# Patient Record
Sex: Male | Born: 1935
Health system: Southern US, Community
[De-identification: ages and names within clinical notes are randomized; demographics above are authoritative.]

## PROBLEM LIST (undated history)

## (undated) DIAGNOSIS — C61 Malignant neoplasm of prostate: Secondary | ICD-10-CM

## (undated) DIAGNOSIS — N186 End stage renal disease: Secondary | ICD-10-CM

## (undated) DIAGNOSIS — E785 Hyperlipidemia, unspecified: Secondary | ICD-10-CM

## (undated) DIAGNOSIS — Z9289 Personal history of other medical treatment: Secondary | ICD-10-CM

## (undated) DIAGNOSIS — D638 Anemia in other chronic diseases classified elsewhere: Secondary | ICD-10-CM

## (undated) DIAGNOSIS — E669 Obesity, unspecified: Secondary | ICD-10-CM

## (undated) DIAGNOSIS — C7951 Secondary malignant neoplasm of bone: Secondary | ICD-10-CM

## (undated) DIAGNOSIS — M109 Gout, unspecified: Secondary | ICD-10-CM

## (undated) DIAGNOSIS — Z992 Dependence on renal dialysis: Secondary | ICD-10-CM

## (undated) DIAGNOSIS — I2510 Atherosclerotic heart disease of native coronary artery without angina pectoris: Secondary | ICD-10-CM

## (undated) DIAGNOSIS — I10 Essential (primary) hypertension: Secondary | ICD-10-CM

## (undated) DIAGNOSIS — E119 Type 2 diabetes mellitus without complications: Secondary | ICD-10-CM

## (undated) DIAGNOSIS — I214 Non-ST elevation (NSTEMI) myocardial infarction: Secondary | ICD-10-CM

## (undated) HISTORY — DX: Gout, unspecified: M10.9

## (undated) HISTORY — DX: Hyperlipidemia, unspecified: E78.5

## (undated) HISTORY — PX: AV FISTULA PLACEMENT: SHX1204

## (undated) HISTORY — DX: Essential (primary) hypertension: I10

## (undated) HISTORY — DX: Obesity, unspecified: E66.9

## (undated) HISTORY — DX: Non-ST elevation (NSTEMI) myocardial infarction: I21.4

## (undated) HISTORY — PX: EYE SURGERY: SHX253

## (undated) HISTORY — PX: COLONOSCOPY W/ POLYPECTOMY: SHX1380

---

## 2005-03-31 ENCOUNTER — Emergency Department (HOSPITAL_COMMUNITY): Admission: EM | Admit: 2005-03-31 | Discharge: 2005-03-31 | Payer: Self-pay | Admitting: Emergency Medicine

## 2005-04-03 ENCOUNTER — Ambulatory Visit (HOSPITAL_COMMUNITY): Admission: RE | Admit: 2005-04-03 | Discharge: 2005-04-03 | Payer: Self-pay | Admitting: Family Medicine

## 2005-06-21 ENCOUNTER — Encounter: Admission: RE | Admit: 2005-06-21 | Discharge: 2005-06-21 | Payer: Self-pay | Admitting: Nephrology

## 2005-07-31 ENCOUNTER — Encounter: Admission: RE | Admit: 2005-07-31 | Discharge: 2005-07-31 | Payer: Self-pay | Admitting: Nephrology

## 2005-09-15 ENCOUNTER — Encounter: Admission: RE | Admit: 2005-09-15 | Discharge: 2005-09-15 | Payer: Self-pay | Admitting: Nephrology

## 2006-01-31 ENCOUNTER — Encounter (INDEPENDENT_AMBULATORY_CARE_PROVIDER_SITE_OTHER): Payer: Self-pay | Admitting: Specialist

## 2006-01-31 ENCOUNTER — Ambulatory Visit (HOSPITAL_COMMUNITY): Admission: RE | Admit: 2006-01-31 | Discharge: 2006-01-31 | Payer: Self-pay | Admitting: Gastroenterology

## 2006-06-20 ENCOUNTER — Encounter (HOSPITAL_COMMUNITY): Admission: RE | Admit: 2006-06-20 | Discharge: 2006-09-18 | Payer: Self-pay | Admitting: Nephrology

## 2006-09-26 ENCOUNTER — Encounter (HOSPITAL_COMMUNITY): Admission: RE | Admit: 2006-09-26 | Discharge: 2006-12-25 | Payer: Self-pay | Admitting: Nephrology

## 2007-01-02 ENCOUNTER — Encounter (HOSPITAL_COMMUNITY): Admission: RE | Admit: 2007-01-02 | Discharge: 2007-04-02 | Payer: Self-pay | Admitting: Nephrology

## 2007-04-17 ENCOUNTER — Encounter (HOSPITAL_COMMUNITY): Admission: RE | Admit: 2007-04-17 | Discharge: 2007-07-16 | Payer: Self-pay | Admitting: Nephrology

## 2007-09-20 ENCOUNTER — Encounter (HOSPITAL_COMMUNITY): Admission: RE | Admit: 2007-09-20 | Discharge: 2007-12-19 | Payer: Self-pay | Admitting: Nephrology

## 2008-01-06 ENCOUNTER — Encounter (HOSPITAL_COMMUNITY): Admission: RE | Admit: 2008-01-06 | Discharge: 2008-01-09 | Payer: Self-pay | Admitting: Nephrology

## 2008-01-20 ENCOUNTER — Encounter (HOSPITAL_COMMUNITY): Admission: RE | Admit: 2008-01-20 | Discharge: 2008-04-19 | Payer: Self-pay | Admitting: Nephrology

## 2008-04-27 ENCOUNTER — Encounter: Admission: RE | Admit: 2008-04-27 | Discharge: 2008-07-26 | Payer: Self-pay | Admitting: Nephrology

## 2008-08-03 ENCOUNTER — Encounter (HOSPITAL_COMMUNITY): Admission: RE | Admit: 2008-08-03 | Discharge: 2008-11-01 | Payer: Self-pay | Admitting: Nephrology

## 2008-11-09 ENCOUNTER — Encounter (HOSPITAL_COMMUNITY): Admission: RE | Admit: 2008-11-09 | Discharge: 2009-02-07 | Payer: Self-pay | Admitting: Nephrology

## 2008-12-09 HISTORY — PX: ARTERIOVENOUS GRAFT PLACEMENT: SUR1029

## 2008-12-15 ENCOUNTER — Ambulatory Visit: Payer: Self-pay | Admitting: Vascular Surgery

## 2008-12-18 ENCOUNTER — Ambulatory Visit: Payer: Self-pay | Admitting: Vascular Surgery

## 2008-12-18 ENCOUNTER — Ambulatory Visit (HOSPITAL_COMMUNITY): Admission: RE | Admit: 2008-12-18 | Discharge: 2008-12-18 | Payer: Self-pay | Admitting: Vascular Surgery

## 2009-01-26 ENCOUNTER — Inpatient Hospital Stay (HOSPITAL_COMMUNITY): Admission: EM | Admit: 2009-01-26 | Discharge: 2009-02-07 | Payer: Self-pay | Admitting: Emergency Medicine

## 2009-01-26 ENCOUNTER — Ambulatory Visit: Payer: Self-pay | Admitting: Cardiology

## 2009-01-28 ENCOUNTER — Encounter: Payer: Self-pay | Admitting: Physician Assistant

## 2009-02-01 ENCOUNTER — Ambulatory Visit: Payer: Self-pay | Admitting: Physical Medicine & Rehabilitation

## 2009-02-01 ENCOUNTER — Encounter (INDEPENDENT_AMBULATORY_CARE_PROVIDER_SITE_OTHER): Payer: Self-pay | Admitting: Nephrology

## 2009-03-03 DIAGNOSIS — D649 Anemia, unspecified: Secondary | ICD-10-CM | POA: Insufficient documentation

## 2009-03-03 DIAGNOSIS — E669 Obesity, unspecified: Secondary | ICD-10-CM | POA: Insufficient documentation

## 2009-03-03 DIAGNOSIS — E785 Hyperlipidemia, unspecified: Secondary | ICD-10-CM | POA: Insufficient documentation

## 2009-03-03 DIAGNOSIS — I10 Essential (primary) hypertension: Secondary | ICD-10-CM | POA: Insufficient documentation

## 2009-03-03 DIAGNOSIS — N184 Chronic kidney disease, stage 4 (severe): Secondary | ICD-10-CM | POA: Insufficient documentation

## 2009-03-03 DIAGNOSIS — M109 Gout, unspecified: Secondary | ICD-10-CM

## 2009-03-04 ENCOUNTER — Ambulatory Visit: Payer: Self-pay | Admitting: Internal Medicine

## 2009-03-04 DIAGNOSIS — R0989 Other specified symptoms and signs involving the circulatory and respiratory systems: Secondary | ICD-10-CM

## 2009-03-04 DIAGNOSIS — R0609 Other forms of dyspnea: Secondary | ICD-10-CM

## 2009-03-16 ENCOUNTER — Telehealth (INDEPENDENT_AMBULATORY_CARE_PROVIDER_SITE_OTHER): Payer: Self-pay | Admitting: *Deleted

## 2009-03-17 ENCOUNTER — Ambulatory Visit: Payer: Self-pay

## 2009-03-17 ENCOUNTER — Encounter (HOSPITAL_COMMUNITY): Admission: RE | Admit: 2009-03-17 | Discharge: 2009-05-11 | Payer: Self-pay | Admitting: Cardiology

## 2009-03-17 ENCOUNTER — Ambulatory Visit: Payer: Self-pay | Admitting: Internal Medicine

## 2009-04-22 ENCOUNTER — Encounter: Payer: Self-pay | Admitting: Cardiology

## 2009-04-26 ENCOUNTER — Ambulatory Visit: Payer: Self-pay | Admitting: Cardiology

## 2009-04-26 DIAGNOSIS — I25810 Atherosclerosis of coronary artery bypass graft(s) without angina pectoris: Secondary | ICD-10-CM

## 2009-04-26 DIAGNOSIS — I214 Non-ST elevation (NSTEMI) myocardial infarction: Secondary | ICD-10-CM

## 2009-04-26 HISTORY — DX: Non-ST elevation (NSTEMI) myocardial infarction: I21.4

## 2010-02-08 NOTE — Progress Notes (Signed)
Summary: Nuclear Pre-Procedure  Phone Note Outgoing Call Call back at [facility] Phone [phone]   Call placed by: Eliezer Lofts, EMT-P,  March 16, 2009 2:52 PM Action Taken: Phone Call Completed Summary of Call: Reviewed information on Myoview Information Sheet (see scanned document for further details).  Spoke with Patient's wife.    Nuclear Med Background Indications for Stress Test: Evaluation for Ischemia   History: Echo  History Comments: 1?24/11 Echo: NL LVF, mild LVH  Symptoms: DOE, SOB    Nuclear Pre-Procedure Cardiac Risk Factors: Family History - CAD, History of Smoking, Hypertension, Lipids, NIDDM Height (in): 74

## 2010-02-08 NOTE — Assessment & Plan Note (Signed)
Summary: 2 month rov/sl   Visit Type:  Follow-up Primary Provider:  Dr. Claris Gower  CC:  HTN.  History of Present Illness: The patient presents after followup of a hospitalization during which he was managed for volume overload and also had enzymes consistent with a non-Q-wave infarct. He was seen once since admission. He did have an outpatient stress perfusion study which suggested perhaps a very small area of inferior infarct. However, it is felt that the enzyme elevation and volume overload are related to his dialysis dependent renal failure. He's been most bothered by gout which is now being treated. This has limited his activities though he is now able to ambulate. He is denying any chest discomfort, neck or arm discomfort. He is having no palpitations, presyncope or syncope. He is having no PND or orthopnea.  Current Medications (verified): 1)  Calcium Acetate 667 Mg Caps (Calcium Acetate (Phos Binder)) .... 3 Caps With Each Meal 2)  Atenolol 100 Mg Tabs (Atenolol) .Marland Kitchen.. 1 Tab Once Daily 3)  Colcrys 0.6 Mg Tabs (Colchicine) .Marland Kitchen.. 1 By Mouth Dialy 4)  Pravachol 40 Mg Tabs (Pravastatin Sodium) .... 2 By Mouth Daily  Allergies (verified): No Known Drug Allergies  Past History:  Past Medical History: Reviewed history from 03/03/2009 and no changes required.  1. Stage IV chronic kidney disease/end-stage renal disease.   2. Hypertension.   3. Anemia.   4. Diabetes.   5. Gout.   6. Dyslipidemia.   7. Obesity.   8. Family history of coronary artery disease.      Past Surgical History: Right upper extremity AV fistula in December 2010.   Review of Systems       Gout.  Otherwise as stated in the history of present illness negative for all other systems.  Vital Signs:  Patient profile:   77 year old male Height:      74 inches Weight:      194 pounds BMI:     25.00 Pulse rate:   61 / minute Resp:     16 per minute BP sitting:   132 / 64  (left arm)  Vitals Entered By:  Levora Angel, CNA (April 26, 2009 9:01 AM)  Physical Exam  General:  Well developed, well nourished, in no acute distress. Head:  normocephalic and atraumatic Neck:  Neck supple, no JVD. No masses, thyromegaly or abnormal cervical nodes. Chest Wall:  no deformities or breast masses noted Lungs:  Clear bilaterally to auscultation and percussion. Heart:  Non-displaced PMI, chest non-tender; regular rate and rhythm, S1, S2  rubs or gallops. Slight systolic murmur heard at the apex only an early peaking. Carotid upstroke normal, no bruit. Normal abdominal aortic size, no bruits. Femorals normal pulses, no bruits. Pedals normal pulses. No edema, no varicosities. Abdomen:  Bowel sounds positive; abdomen soft and non-tender without masses, organomegaly, or hernias noted. No hepatosplenomegaly. Msk:  Back normal, normal gait. Muscle strength and tone normal. Extremities:  right upper arm dialysis graft and fistula with thrill and bruit Neurologic:  Alert and oriented x 3. Skin:  Intact without lesions or rashes. Psych:  Normal affect.   Impression & Recommendations:  Problem # 1:  MYOCARDIAL INFARCTION, ACUTE, NON-Q WAVE (ICD-410.70) The patient had an enzyme elevation with multiple other comorbidities at the time of his hospital stay. He's not having any chest pain. He had a low risk perfusion study. No further cardiovascular testing is suggested. He should continue with aggressive risk reduction.  Problem # 2:  DYSLIPIDEMIA (ICD-272.4) He reports being given a "gold star" on his last lipids.  Problem # 3:  HYPERTENSION (ICD-401.9) He reports no BP drops with dialysis.  Meds per the neprhologists.  Patient Instructions: 1)  Your physician recommends that you schedule a follow-up appointment as needed 2)  Your physician recommends that you continue on your current medications as directed. Please refer to the Current Medication list given to you today.

## 2010-02-08 NOTE — Assessment & Plan Note (Signed)
Summary: aden myoview wt 18  Nuclear Med Background Indications for Stress Test: Evaluation for Ischemia, Abnormal EKG  Indications Comments: 02/07/09 admitted for ESRD on dialysis with ^cardiac enzymes and abnormal EKG  History: Echo  History Comments: 1?24/11 Echo:normal LVF, mild LVH  Symptoms: Chest Tightness, Diaphoresis, DOE, Rapid HR, SOB  Symptoms Comments: Last episode of [id number] since discharge.   Nuclear Pre-Procedure Cardiac Risk Factors: Family History - CAD, History of Smoking, Hypertension, Lipids, NIDDM Caffeine/Decaff Intake: none NPO After: 6:00 PM Lungs: Clear IV 0.9% NS with Angio Cath: 20g     IV Site: (L)  IV Started by: Eliezer Lofts EMT-P Chest Size (in) 46     Height (in): 74 Weight (lb): 194 BMI: 25.00  Nuclear Med Study 1 or 2 day study:  1 day     Stress Test Type:  Carlton Adam Reading MD:  Glori Bickers, MD     Referring MD:  Minus Breeding, MD Resting Radionuclide:  Technetium 76m Tetrofosmin     Resting Radionuclide Dose:  10.7 mCi  Stress Radionuclide:  Technetium 75m Tetrofosmin     Stress Radionuclide Dose:  33.0 mCi   Stress Protocol   Lexiscan: 0.4 mg   Stress Test Technologist:  Valetta Fuller CMA-N     Nuclear Technologist:  Mariann Laster Deal RT-N  Rest Procedure  Myocardial perfusion imaging was performed at rest 45 minutes following the intravenous administration of Myoview Technetium 98m Tetrofosmin.  Stress Procedure  The patient received IV Lexiscan 0.4 mg over 15-seconds.  Myoview injected at 30-seconds.  There were no significant changes with lexiscan.  Quantitative spect images were obtained after a 45 minute delay.  QPS Raw Data Images:  Normal; no motion artifact; normal heart/lung ratio. Stress Images:  Decreased uptake in the inferior wall Rest Images:  Decreased uptake in the inferior wall Subtraction (SDS):  There is a fixed defect that is most consistent with a previous infarction. Transient Ischemic Dilatation:  1.09   (Normal <1.22)  Lung/Heart Ratio:  .28  (Normal <0.45)  Quantitative Gated Spect Images QGS EDV:  151 ml QGS ESV:  66 ml QGS EF:  56 % QGS cine images:  Very mild inferior hypokinesis.  Findings Low risk nuclear study  Evidence for inferior infarct     Overall Impression  Exercise Capacity: [place] study with no exercise. ECG Impression: Baseline: NSR; No significant ST segment change with Lexiscan. Overall Impression: Previous inferior infarct. No ischemia.  Appended Document: aden myoview wt 225 Low risk exam.  Questionable old previous inferior infarct.  Echo shows no evidence of this.  Keep follow up appt in April.  Appended Document: aden myoview wt 225 pt aware of results

## 2010-02-08 NOTE — Assessment & Plan Note (Signed)
Summary: eph/dx: abn ekg/ gd   Visit Type:  EPH Primary Provider:  Dr. Arelia Sneddon  CC:  sob at times when laying down...has a gout flare today in his right hand/leg and left hand.  History of Present Illness: This is a 77 year old, African American male, patient, who was admitted to the hospital with end-stage renal disease and placed on dialysis. While in hospital. His cardiac enzymes were noted to be elevated and his EKG was abnormal. He was seen in consult by Dr.Hochrein. It was felt the cardiac enzymes and abnormal EKG were secondary to LVH with strain and end-stage renal disease. 2-D echo was done and showed normal LV function with LVH and grade 1 diastolic dysfunction. Because of this outpatient stress test was recommended.  The patient complains of significant pain from gout, since he's been home. He has swelling in his right hand and right foot because of this. He does have some dyspnea on exertion, which is worse after dialysis. He also wakes up short of breath at night sometimes. He says it's relieved with sitting up. He denies chest pain, palpitations, dizziness, or presyncope.  Problems Prior to Update: 1)  Obesity  (ICD-278.00) 2)  Dyslipidemia  (ICD-272.4) 3)  Gout  (ICD-274.9) 4)  Anemia  (ICD-285.9) 5)  Hypertension  (ICD-401.9) 6)  Renal Disease, Chronic, Stage Iv  (ICD-585.4)  Current Medications (verified): 1)  Calcium Acetate 667 Mg Caps (Calcium Acetate (Phos Binder)) .... 3 Caps With Each Meal 2)  Rena-Vite  Tabs (B Complex-C-Folic Acid) .Marland Kitchen.. 1 Tab Once Daily 3)  Allopurinol 300 Mg Tabs (Allopurinol) .... 1/2 Tab At Bedtime 4)  Atenolol 100 Mg Tabs (Atenolol) .Marland Kitchen.. 1 Tab Once Daily  Allergies (verified): No Known Drug Allergies  Past History:  Past Medical History: Last updated: 03/03/2009  1. Stage IV chronic kidney disease/end-stage renal disease.   2. Hypertension.   3. Anemia.   4. Diabetes.   5. Gout.   6. Dyslipidemia.   7. Obesity.   8. Family history  of coronary artery disease.      Review of Systems       see history of present illness  Vital Signs:  Patient profile:   77 year old male Height:      74 inches Weight:      225 pounds BMI:     28.99 Pulse rate:   67 / minute Pulse rhythm:   regular BP sitting:   137 / 64  (left arm) Cuff size:   regular  Vitals Entered By: Julaine Hua, CMA (March 04, 2009 8:39 AM)  Physical Exam  General:   Well-nournished, in no acute distress. Neck: No JVD, HJR, Bruit, or thyroid enlargement Lungs: No tachypnea, clear without wheezing, rales, or rhonchi Cardiovascular: RRR, PMI not displaced, heart sounds normal, no murmurs, gallops, bruit, thrill, or heave. Abdomen: BS normal. Soft without organomegaly, masses, lesions or tenderness. Extremities: his right hand is swollen from gout, trace of edema in bilateral ankles. The right foot is more swollen than left, secondary to gout,without cyanosis, clubbing. Good distal pulses bilateral SKin: Warm, no lesions or rashes  Musculoskeletal: No deformities Neuro: no focal signs    Impression & Recommendations:  Problem # 1:  DYSPNEA ON EXERTION (ICD-786.09) The patient continues to have dyspnea on exertion, and has multiple cardiac risk factors. On exam, his lungs are clear. He does have diastolic dysfunction on 2-D echo. Because of his cardiac risk factors and dyspnea on exertion. We will order an Adenosine Myoview. His  updated medication list for this problem includes:    Atenolol 100 Mg Tabs (Atenolol) .Marland Kitchen... 1 tab once daily  Orders: Nuclear Stress Test (Nuc Stress Test)  His updated medication list for this problem includes:    Atenolol 100 Mg Tabs (Atenolol) .Marland Kitchen... 1 tab once daily  Problem # 2:  HYPERTENSION (ICD-401.9) Patient needs refill on his atenolol His updated medication list for this problem includes:    Atenolol 100 Mg Tabs (Atenolol) .Marland Kitchen... 1 tab once daily  Problem # 3:  GOUT (ICD-274.9) patient's gout, is causing  him much discomfort. We will hold off on the stress test until he is more comfortable His updated medication list for this problem includes:    Allopurinol 300 Mg Tabs (Allopurinol) .Marland Kitchen... 1/2 tab at bedtime  Problem # 4:  RENAL DISEASE, CHRONIC, STAGE IV (ICD-585.4) Patient has end-stage renal disease on hemodialysis. He is in a wheelchair and has trouble getting around. I have filled out handicap applications for him.  Patient Instructions: 1)  Your physician recommends that you schedule a follow-up appointment in: 2 months with Dr. Percival Spanish 2)  Your physician has requested that you have an adenosine myoview.  For further information please visit [email or web address].  Please follow instruction sheet, as given. Prescriptions: ATENOLOL 100 MG TABS (ATENOLOL) 1 tab once daily  #30 x 6   Entered by:   Carollee Sires, RN, BSN   Authorized by:   Maretta Bees, PA-C   Signed by:   Carollee Sires, RN, BSN on 03/04/2009   Method used:   Electronically to        [facility] (retail)       [address]       [place], [place]  [address]       Ph: [phone]       Fax: [phone]   RxID:   [id number]

## 2010-02-08 NOTE — Miscellaneous (Signed)
  Clinical Lists Changes  Observations: Added new observation of NUCLEAR NOS: Exercise Capacity: [place] study with no exercise. ECG Impression: Baseline: NSR; No significant ST segment change with Lexiscan. Overall Impression: Previous inferior infarct. No ischemia.    (03/17/2009 13:57)      Nuclear Study  Procedure date:  03/17/2009  Findings:      Exercise Capacity: [place] study with no exercise. ECG Impression: Baseline: NSR; No significant ST segment change with Lexiscan. Overall Impression: Previous inferior infarct. No ischemia.

## 2010-03-27 LAB — RENAL FUNCTION PANEL
Albumin: 2.4 g/dL — ABNORMAL LOW (ref 3.5–5.2)
Albumin: 2.6 g/dL — ABNORMAL LOW (ref 3.5–5.2)
Albumin: 2.6 g/dL — ABNORMAL LOW (ref 3.5–5.2)
BUN: 123 mg/dL — ABNORMAL HIGH (ref 6–23)
BUN: 159 mg/dL — ABNORMAL HIGH (ref 6–23)
BUN: 97 mg/dL — ABNORMAL HIGH (ref 6–23)
CO2: 16 mEq/L — ABNORMAL LOW (ref 19–32)
CO2: 17 mEq/L — ABNORMAL LOW (ref 19–32)
CO2: 19 mEq/L (ref 19–32)
CO2: 25 mEq/L (ref 19–32)
Calcium: 7.8 mg/dL — ABNORMAL LOW (ref 8.4–10.5)
Calcium: 8.5 mg/dL (ref 8.4–10.5)
Calcium: 8.6 mg/dL (ref 8.4–10.5)
Chloride: 96 mEq/L (ref 96–112)
Creatinine, Ser: 8.88 mg/dL — ABNORMAL HIGH (ref 0.4–1.5)
Creatinine, Ser: 9.03 mg/dL — ABNORMAL HIGH (ref 0.4–1.5)
Creatinine, Ser: 9.44 mg/dL — ABNORMAL HIGH (ref 0.4–1.5)
GFR calc Af Amer: 7 mL/min — ABNORMAL LOW (ref >60)
GFR calc Af Amer: 7 mL/min — ABNORMAL LOW (ref >60)
GFR calc Af Amer: 8 mL/min — ABNORMAL LOW (ref >60)
GFR calc Af Amer: 9 mL/min — ABNORMAL LOW (ref >60)
GFR calc non Af Amer: 5 mL/min — ABNORMAL LOW (ref >60)
GFR calc non Af Amer: 6 mL/min — ABNORMAL LOW (ref >60)
GFR calc non Af Amer: 7 mL/min — ABNORMAL LOW (ref >60)
Glucose, Bld: 121 mg/dL — ABNORMAL HIGH (ref 70–99)
Glucose, Bld: 146 mg/dL — ABNORMAL HIGH (ref 70–99)
Glucose, Bld: 178 mg/dL — ABNORMAL HIGH (ref 70–99)
Phosphorus: 10.2 mg/dL (ref 2.3–4.6)
Phosphorus: 4.9 mg/dL — ABNORMAL HIGH (ref 2.3–4.6)
Phosphorus: 9.3 mg/dL (ref 2.3–4.6)
Potassium: 4.3 mEq/L (ref 3.5–5.1)
Potassium: 5.2 mEq/L — ABNORMAL HIGH (ref 3.5–5.1)
Sodium: 135 mEq/L (ref 135–145)
Sodium: 139 mEq/L (ref 135–145)

## 2010-03-27 LAB — CBC
HCT: 25.5 % — ABNORMAL LOW (ref 39.0–52.0)
HCT: 27.3 % — ABNORMAL LOW (ref 39.0–52.0)
HCT: 30.3 % — ABNORMAL LOW (ref 39.0–52.0)
Hemoglobin: 10.3 g/dL — ABNORMAL LOW (ref 13.0–17.0)
Hemoglobin: 8.8 g/dL — ABNORMAL LOW (ref 13.0–17.0)
MCHC: 33 g/dL (ref 30.0–36.0)
MCHC: 33.7 g/dL (ref 30.0–36.0)
MCHC: 34.7 g/dL (ref 30.0–36.0)
MCV: 89.7 fL (ref 78.0–100.0)
MCV: 89.7 fL (ref 78.0–100.0)
MCV: 91.5 fL (ref 78.0–100.0)
Platelets: 492 10*3/uL — ABNORMAL HIGH (ref 150–400)
Platelets: 501 10*3/uL — ABNORMAL HIGH (ref 150–400)
Platelets: 511 10*3/uL — ABNORMAL HIGH (ref 150–400)
RBC: 2.84 MIL/uL — ABNORMAL LOW (ref 4.22–5.81)
RBC: 3.27 MIL/uL — ABNORMAL LOW (ref 4.22–5.81)
RBC: 3.3 MIL/uL — ABNORMAL LOW (ref 4.22–5.81)
RDW: 14.3 % (ref 11.5–15.5)
RDW: 14.6 % (ref 11.5–15.5)
WBC: 17.7 10*3/uL — ABNORMAL HIGH (ref 4.0–10.5)

## 2010-03-27 LAB — GLUCOSE, CAPILLARY
Glucose-Capillary: 103 mg/dL — ABNORMAL HIGH (ref 70–99)
Glucose-Capillary: 104 mg/dL — ABNORMAL HIGH (ref 70–99)
Glucose-Capillary: 105 mg/dL — ABNORMAL HIGH (ref 70–99)
Glucose-Capillary: 109 mg/dL — ABNORMAL HIGH (ref 70–99)
Glucose-Capillary: 110 mg/dL — ABNORMAL HIGH (ref 70–99)
Glucose-Capillary: 110 mg/dL — ABNORMAL HIGH (ref 70–99)
Glucose-Capillary: 120 mg/dL — ABNORMAL HIGH (ref 70–99)
Glucose-Capillary: 120 mg/dL — ABNORMAL HIGH (ref 70–99)
Glucose-Capillary: 121 mg/dL — ABNORMAL HIGH (ref 70–99)
Glucose-Capillary: 123 mg/dL — ABNORMAL HIGH (ref 70–99)
Glucose-Capillary: 126 mg/dL — ABNORMAL HIGH (ref 70–99)
Glucose-Capillary: 127 mg/dL — ABNORMAL HIGH (ref 70–99)
Glucose-Capillary: 128 mg/dL — ABNORMAL HIGH (ref 70–99)
Glucose-Capillary: 129 mg/dL — ABNORMAL HIGH (ref 70–99)
Glucose-Capillary: 131 mg/dL — ABNORMAL HIGH (ref 70–99)
Glucose-Capillary: 132 mg/dL — ABNORMAL HIGH (ref 70–99)
Glucose-Capillary: 137 mg/dL — ABNORMAL HIGH (ref 70–99)
Glucose-Capillary: 149 mg/dL — ABNORMAL HIGH (ref 70–99)
Glucose-Capillary: 151 mg/dL — ABNORMAL HIGH (ref 70–99)
Glucose-Capillary: 157 mg/dL — ABNORMAL HIGH (ref 70–99)
Glucose-Capillary: 166 mg/dL — ABNORMAL HIGH (ref 70–99)
Glucose-Capillary: 172 mg/dL — ABNORMAL HIGH (ref 70–99)
Glucose-Capillary: 195 mg/dL — ABNORMAL HIGH (ref 70–99)
Glucose-Capillary: 96 mg/dL (ref 70–99)
Glucose-Capillary: 97 mg/dL (ref 70–99)

## 2010-03-27 LAB — DIFFERENTIAL
Eosinophils Relative: 0 % (ref 0–5)
Lymphocytes Relative: 7 % — ABNORMAL LOW (ref 12–46)
Lymphs Abs: 0.7 10*3/uL (ref 0.7–4.0)
Monocytes Relative: 8 % (ref 3–12)
Neutro Abs: 8.4 10*3/uL — ABNORMAL HIGH (ref 1.7–7.7)

## 2010-03-27 LAB — BASIC METABOLIC PANEL
BUN: 234 mg/dL — ABNORMAL HIGH (ref 6–23)
CO2: 9 mEq/L — CL (ref 19–32)
Calcium: 7.8 mg/dL — ABNORMAL LOW (ref 8.4–10.5)
Chloride: 100 mEq/L (ref 96–112)
Chloride: 97 mEq/L (ref 96–112)
Creatinine, Ser: 8.97 mg/dL — ABNORMAL HIGH (ref 0.4–1.5)
GFR calc Af Amer: 7 mL/min — ABNORMAL LOW (ref >60)
GFR calc non Af Amer: 6 mL/min — ABNORMAL LOW (ref >60)
Glucose, Bld: 95 mg/dL (ref 70–99)
Potassium: 4.8 mEq/L (ref 3.5–5.1)
Sodium: 134 mEq/L — ABNORMAL LOW (ref 135–145)

## 2010-03-27 LAB — CARDIAC PANEL(CRET KIN+CKTOT+MB+TROPI)
CK, MB: 4.9 ng/mL — ABNORMAL HIGH (ref 0.3–4.0)
CK, MB: 7 ng/mL (ref 0.3–4.0)
Relative Index: 0.8 (ref 0.0–2.5)
Total CK: 494 U/L — ABNORMAL HIGH (ref 7–232)
Total CK: 632 U/L — ABNORMAL HIGH (ref 7–232)
Troponin I: 0.05 ng/mL (ref 0.00–0.06)
Troponin I: 0.19 ng/mL — ABNORMAL HIGH (ref 0.00–0.06)
Troponin I: 0.2 ng/mL — ABNORMAL HIGH (ref 0.00–0.06)

## 2010-03-27 LAB — POTASSIUM: Potassium: 5 mEq/L (ref 3.5–5.1)

## 2010-03-27 LAB — HEPATITIS B CORE ANTIBODY, TOTAL: Hep B Core Total Ab: NEGATIVE

## 2010-03-27 LAB — IRON AND TIBC
Iron: 26 ug/dL — ABNORMAL LOW (ref 42–135)
TIBC: 204 ug/dL — ABNORMAL LOW (ref 215–435)
UIBC: 178 ug/dL

## 2010-03-27 LAB — HEPATITIS B SURFACE ANTIGEN: Hepatitis B Surface Ag: NEGATIVE

## 2010-03-27 LAB — POCT CARDIAC MARKERS
CKMB, poc: 25 ng/mL (ref 1.0–8.0)
Myoglobin, poc: >500 ng/mL (ref 12–200)
Troponin i, poc: <0.05 ng/mL (ref 0.00–0.09)

## 2010-03-27 LAB — CK TOTAL AND CKMB (NOT AT ARMC): CK, MB: 10 ng/mL (ref 0.3–4.0)

## 2010-03-27 LAB — URIC ACID: Uric Acid, Serum: 14.6 mg/dL (ref 4.0–7.8)

## 2010-04-12 LAB — POCT I-STAT 4, (NA,K, GLUC, HGB,HCT)
Glucose, Bld: 113 mg/dL — ABNORMAL HIGH (ref 70–99)
HCT: 38 % — ABNORMAL LOW (ref 39.0–52.0)
Hemoglobin: 12.9 g/dL — ABNORMAL LOW (ref 13.0–17.0)
Potassium: 4.5 mEq/L (ref 3.5–5.1)

## 2010-04-12 LAB — GLUCOSE, CAPILLARY: Glucose-Capillary: 96 mg/dL (ref 70–99)

## 2010-04-12 LAB — POCT HEMOGLOBIN-HEMACUE: Hemoglobin: 10.6 g/dL — ABNORMAL LOW (ref 13.0–17.0)

## 2010-04-13 LAB — RENAL FUNCTION PANEL
Albumin: 3.4 g/dL — ABNORMAL LOW (ref 3.5–5.2)
BUN: 64 mg/dL — ABNORMAL HIGH (ref 6–23)
Chloride: 110 mEq/L (ref 96–112)
Creatinine, Ser: 4.94 mg/dL — ABNORMAL HIGH (ref 0.4–1.5)
GFR calc Af Amer: 14 mL/min — ABNORMAL LOW (ref >60)
GFR calc non Af Amer: 12 mL/min — ABNORMAL LOW (ref >60)
Phosphorus: 3.8 mg/dL (ref 2.3–4.6)
Potassium: 4.2 mEq/L (ref 3.5–5.1)

## 2010-04-13 LAB — PTH, INTACT AND CALCIUM
Calcium, Total (PTH): 7.9 mg/dL — ABNORMAL LOW (ref 8.4–10.5)
PTH: 413.4 pg/mL — ABNORMAL HIGH (ref 14.0–72.0)

## 2010-04-13 LAB — POCT HEMOGLOBIN-HEMACUE: Hemoglobin: 10.1 g/dL — ABNORMAL LOW (ref 13.0–17.0)

## 2010-04-14 LAB — POCT HEMOGLOBIN-HEMACUE: Hemoglobin: 12.6 g/dL — ABNORMAL LOW (ref 13.0–17.0)

## 2010-04-15 LAB — POCT HEMOGLOBIN-HEMACUE: Hemoglobin: 11.4 g/dL — ABNORMAL LOW (ref 13.0–17.0)

## 2010-04-16 LAB — RENAL FUNCTION PANEL
CO2: 20 mEq/L (ref 19–32)
Chloride: 112 mEq/L (ref 96–112)
Creatinine, Ser: 4.33 mg/dL — ABNORMAL HIGH (ref 0.4–1.5)
GFR calc Af Amer: 16 mL/min — ABNORMAL LOW (ref >60)
GFR calc non Af Amer: 13 mL/min — ABNORMAL LOW (ref >60)
Potassium: 4.9 mEq/L (ref 3.5–5.1)

## 2010-04-16 LAB — POCT HEMOGLOBIN-HEMACUE: Hemoglobin: 11.6 g/dL — ABNORMAL LOW (ref 13.0–17.0)

## 2010-04-16 LAB — IRON AND TIBC
Iron: 94 ug/dL (ref 42–135)
UIBC: 181 ug/dL

## 2010-04-17 LAB — POCT HEMOGLOBIN-HEMACUE
Hemoglobin: 10.9 g/dL — ABNORMAL LOW (ref 13.0–17.0)
Hemoglobin: 11.5 g/dL — ABNORMAL LOW (ref 13.0–17.0)

## 2010-04-18 LAB — POCT HEMOGLOBIN-HEMACUE: Hemoglobin: 12.2 g/dL — ABNORMAL LOW (ref 13.0–17.0)

## 2010-04-19 LAB — RENAL FUNCTION PANEL
Albumin: 3.2 g/dL — ABNORMAL LOW (ref 3.5–5.2)
Calcium: 8.1 mg/dL — ABNORMAL LOW (ref 8.4–10.5)
GFR calc Af Amer: 16 mL/min — ABNORMAL LOW (ref >60)
Phosphorus: 4.6 mg/dL (ref 2.3–4.6)
Potassium: 4.3 mEq/L (ref 3.5–5.1)
Sodium: 139 mEq/L (ref 135–145)

## 2010-04-19 LAB — POCT HEMOGLOBIN-HEMACUE: Hemoglobin: 11.6 g/dL — ABNORMAL LOW (ref 13.0–17.0)

## 2010-04-19 LAB — FERRITIN: Ferritin: 81 ng/mL (ref 22–322)

## 2010-04-19 LAB — PTH, INTACT AND CALCIUM
Calcium, Total (PTH): 8.1 mg/dL — ABNORMAL LOW (ref 8.4–10.5)
PTH: 438 pg/mL — ABNORMAL HIGH (ref 14.0–72.0)

## 2010-04-19 LAB — IRON AND TIBC: TIBC: 297 ug/dL (ref 215–435)

## 2010-04-20 LAB — POCT HEMOGLOBIN-HEMACUE: Hemoglobin: 11.4 g/dL — ABNORMAL LOW (ref 13.0–17.0)

## 2010-04-26 LAB — FERRITIN: Ferritin: 117 ng/mL (ref 22–322)

## 2010-04-26 LAB — POCT HEMOGLOBIN-HEMACUE: Hemoglobin: 11.2 g/dL — ABNORMAL LOW (ref 13.0–17.0)

## 2010-04-26 LAB — IRON AND TIBC
Saturation Ratios: 35 % (ref 20–55)
UIBC: 190 ug/dL

## 2010-04-26 LAB — PTH, INTACT AND CALCIUM: Calcium, Total (PTH): 8.7 mg/dL (ref 8.4–10.5)

## 2010-05-24 NOTE — Procedures (Signed)
CEPHALIC VEIN MAPPING   INDICATION:  Preop AVF.   HISTORY:  Chronic kidney disease.   EXAM:  The right cephalic vein is compressible.   Diameter measurements range from 0.27 cm to 0.69 cm; however, 0.39 cm to  0.69 cm in the brachium.   The left cephalic vein is compressible.   Diameter measurements range from 0.23 cm to 0.69 cm; however, 0.36 cm to  0.69 cm in the brachium.   See attached worksheet for all measurements.   IMPRESSION:  1. Patent bilateral cephalic veins which are of acceptable diameter      for use as a dialysis access site in the brachium.  2. Bilateral basilic veins are patent with measurements as shown in      the diagram.   ___________________________________________  Nelda Severe. Kellie Simmering, M.D.   AS/MEDQ  D:  12/15/2008  T:  12/15/2008  Job:  [id number]

## 2010-05-24 NOTE — Consult Note (Signed)
NEW PATIENT CONSULTATION   [name], [name]  DOB:  [birth year]                                       12/15/2008  CHART#:[id number]   The patient is a 77 year old male patient referred by Dr. Florene [name] for end-  stage renal disease and vascular access evaluation.  This left-handed  gentleman has a history of progressive renal insufficiency over the last  few years and is nearing hemodialysis with a creatinine recently of 4.4.  Although he uses both upper extremities frequently he primarily uses his  left upper extremity to write and for most activities.   CHRONIC STABLE MEDICAL PROBLEMS:  Include:  1. Hypertension.  2. Hyperlipidemia.  3. Gout.  4. End-stage renal disease.  5. Type 2 diabetes mellitus.   PAST SURGICAL HISTORY:  None.   SOCIAL HISTORY:  Positive for diabetes in sister.  Negative for coronary  artery disease and stroke.   SOCIAL HISTORY:  He is married, has 2 children and is a retired Dietitian.  He smoked a pack of cigarettes per day for 50 years but has not  in the last several years.  He does not use alcohol.   REVIEW OF SYSTEMS:  Positive for dyspnea on exertion, chronic renal  insufficiency, arthritis, joint pain, history of gout.  Otherwise a 10  point review of systems negative except for HPI.   PHYSICAL EXAM:  Vital signs:  Blood pressure is 140/80, heart rate is  70, respirations 14.  General:  He is a well-developed, well-nourished  male who is in no apparent distress, alert and oriented x3.  Neck:  Supple, 3+ carotid pulses palpable.  No bruits are audible.  Neurological:  Normal.  No palpable adenopathy in the neck.  EOMs are  intact.  HEENT:  Exam is normal.  Chest:  Clear to auscultation.  Cardiovascular:  Regular rhythm.  No murmurs.  Abdomen:  Soft, nontender  with no palpable masses.  Musculoskeletal:  Exam reveals no major  deformities.  Skin:  Free of rashes.   I have reviewed and summarized the information provided to me  by Dr.  Florene [name] regarding his medical history.  I also ordered a vein mapping  today which I visualized and interpreted.  He does have adequate  cephalic veins bilaterally for creation of an AV fistula best in the  upper arm.   I think the best plan would be creation of a right upper arm AV fistula  which I have scheduled for Friday, December 10 at [facility].  Hopefully this will provide satisfactory vascular access for this nice  man.   [name] [name], M.D.  Electronically Signed   [name] [name]  D:  12/15/2008  T:  12/16/2008  Job:  [id number]   cc:   [name] [name]. [name] [name], M.D.

## 2010-05-27 NOTE — Op Note (Signed)
NAME:  [name] [name], [name] [name] NO.:  [id number]   MEDICAL RECORD NO.:  [id number]          PATIENT TYPE:  AMB   LOCATION:  ENDO                         FACILITY:  [facility]   PHYSICIAN:  Jeryl Columbia, M.D.    DATE OF BIRTH:  1930-12-08   DATE OF PROCEDURE:  01/31/2006  DATE OF DISCHARGE:                               OPERATIVE REPORT   PROCEDURE:  Flex sig with biopsy.   INDICATIONS:  Patient with an abnormal Pap and rectal polyp pertinent  for high-grade dysplasia, want to confirm we got it all. Consent was  signed after risks, benefits, methods, options thoroughly discussed  prior to the procedure and in the office.   MEDICINES USED:  None.   PROCEDURE:  Rectal inspection pertinent for small external hemorrhoids.  Digital exam negative.  The pediatric video colonoscope was inserted,  easily advanced to 45 cm.  Prep was excellent. Scope was slowly  withdrawn.  Occasional left-sided diverticula were seen, small and  early. At about 40 cm a small polypectomy site was seen.  We went ahead  and cold biopsied and put in the first container.  On slow withdrawal  back to the sigmoid in the distal sigmoid at about 20 cm, questionable  other tiny to small sessile polyp was seen.  It was cold biopsied and  put in a second container.  Scope was slowly withdrawn and in the  proximal rectum the small polypectomy site from the polyp in question  was found. There was no obvious residual polyp. Multiple biopsies were  obtained after photo documentation and put in the third container.  Anorectal pull-through and retroflexion confirmed some small  hemorrhoids.  Scope was straightened, air was suctioned, scope removed.  The patient tolerated the procedure well.  There was no obvious  immediate complication.   ENDOSCOPIC DIAGNOSES:  1. Internal-external small hemorrhoids.  2. Occasional left-sided diverticula.  3. Rectal site of previous polypectomy in question biopsied and put in  container #3.  4. Questionable small sessile polyp 20 cm biopsied.  5. Went ahead and biopsied the site of the sigmoid polyp at 40 cm.  6. Otherwise within normal limits to 45 cm .   PLAN:  Await pathology, particularly in container #3 to document  complete removal and I will see him back in 1 month to recheck CBC, iron  studies, guaiacs and make sure no further workup plans are needed.           ______________________________  Jeryl Columbia, M.D.     MEM/MEDQ  D:  01/31/2006  T:  01/31/2006  Job:  [id number]   cc:   Claris Gower, M.D.

## 2010-09-29 LAB — FERRITIN: Ferritin: 72 (ref 22–322)

## 2010-09-29 LAB — IRON AND TIBC
Iron: 81
Saturation Ratios: 27
TIBC: 303
UIBC: 222

## 2010-09-29 LAB — CBC
Hemoglobin: 11.9 — ABNORMAL LOW
MCHC: 33.5
MCV: 84.3
RBC: 4.21 — ABNORMAL LOW
RDW: 16.9 — ABNORMAL HIGH

## 2010-09-29 LAB — RENAL FUNCTION PANEL
CO2: 26
Calcium: 9.3
Chloride: 108
Creatinine, Ser: 2.3 — ABNORMAL HIGH
GFR calc Af Amer: 34 — ABNORMAL LOW
GFR calc non Af Amer: 28 — ABNORMAL LOW
Glucose, Bld: 124 — ABNORMAL HIGH

## 2010-09-30 LAB — CBC
HCT: 33.9 — ABNORMAL LOW
Hemoglobin: 11.2 — ABNORMAL LOW
MCV: 84.2
Platelets: 333
RDW: 16.1 — ABNORMAL HIGH

## 2010-10-04 LAB — IRON AND TIBC
Iron: 40 — ABNORMAL LOW
UIBC: 216

## 2010-10-04 LAB — CBC
HCT: 27.8 — ABNORMAL LOW
MCHC: 34.3
MCV: 84.6
Platelets: 253
RBC: 3.29 — ABNORMAL LOW
WBC: 5.3

## 2010-10-04 LAB — RENAL FUNCTION PANEL
Albumin: 3 — ABNORMAL LOW
Calcium: 9.6
Creatinine, Ser: 2.81 — ABNORMAL HIGH
GFR calc Af Amer: 27 — ABNORMAL LOW
GFR calc non Af Amer: 22 — ABNORMAL LOW
Sodium: 140

## 2010-10-04 LAB — FERRITIN: Ferritin: 409 — ABNORMAL HIGH (ref 22–322)

## 2010-10-04 LAB — PTH, INTACT AND CALCIUM: Calcium, Total (PTH): 9.2

## 2010-10-06 LAB — RENAL FUNCTION PANEL
BUN: 37 — ABNORMAL HIGH
Creatinine, Ser: 2.63 — ABNORMAL HIGH
Glucose, Bld: 120 — ABNORMAL HIGH
Phosphorus: 3.5
Potassium: 4.3

## 2010-10-06 LAB — PTH, INTACT AND CALCIUM: PTH: 157.9 — ABNORMAL HIGH

## 2010-10-06 LAB — CBC
MCHC: 33.9
MCV: 88.3
Platelets: 174
Platelets: 161
RBC: 4.4
RDW: 15.9 — ABNORMAL HIGH
RDW: 14
WBC: 4.4

## 2010-10-06 LAB — IRON AND TIBC
Iron: 71
Saturation Ratios: 26
TIBC: 275
UIBC: 204

## 2010-10-11 LAB — IRON AND TIBC
Saturation Ratios: 34 % (ref 20–55)
UIBC: 192 ug/dL

## 2010-10-11 LAB — HEMOGLOBIN AND HEMATOCRIT, BLOOD: HCT: 32.7 % — ABNORMAL LOW (ref 39.0–52.0)

## 2010-10-11 LAB — FERRITIN: Ferritin: 225 ng/mL (ref 22–322)

## 2010-10-11 LAB — POCT HEMOGLOBIN-HEMACUE: Hemoglobin: 9.7 g/dL — ABNORMAL LOW (ref 13.0–17.0)

## 2010-10-14 LAB — POCT HEMOGLOBIN-HEMACUE: Hemoglobin: 12.8 g/dL — ABNORMAL LOW (ref 13.0–17.0)

## 2010-10-17 LAB — CBC
HCT: 34.9 — ABNORMAL LOW
Hemoglobin: 11.9 — ABNORMAL LOW
RDW: 18 — ABNORMAL HIGH
WBC: 6.2

## 2010-10-18 LAB — CBC
MCHC: 33.2
MCV: 85.3
Platelets: 328

## 2010-10-18 LAB — IRON AND TIBC: Iron: 65

## 2010-10-19 LAB — CBC
HCT: 31.1 — ABNORMAL LOW
Hemoglobin: 10.3 — ABNORMAL LOW
MCV: 84
Platelets: 325
RDW: 18 — ABNORMAL HIGH
WBC: 6

## 2010-10-19 LAB — RENAL FUNCTION PANEL
CO2: 24
Calcium: 9.2
Chloride: 106
Glucose, Bld: 133 — ABNORMAL HIGH
Sodium: 140

## 2010-10-20 LAB — CBC
HCT: 31.9 — ABNORMAL LOW
Hemoglobin: 10.7 — ABNORMAL LOW
MCV: 82.1
Platelets: 237
RBC: 3.89 — ABNORMAL LOW
WBC: 7.3

## 2010-10-20 LAB — IRON AND TIBC: UIBC: 218

## 2010-10-20 LAB — FERRITIN: Ferritin: 170 (ref 22–322)

## 2010-10-24 LAB — RENAL FUNCTION PANEL
BUN: 30 — ABNORMAL HIGH
CO2: 28
Calcium: 9.6
Glucose, Bld: 109 — ABNORMAL HIGH
Phosphorus: 3.4

## 2010-10-24 LAB — IRON AND TIBC
Iron: 67
Saturation Ratios: 24
TIBC: 280
UIBC: 213

## 2010-10-24 LAB — PTH, INTACT AND CALCIUM: PTH: 94.7 — ABNORMAL HIGH

## 2010-10-24 LAB — CBC
Hemoglobin: 10.6 — ABNORMAL LOW
RBC: 3.94 — ABNORMAL LOW
WBC: 5.4

## 2010-10-24 LAB — FERRITIN: Ferritin: 161 (ref 22–322)

## 2011-06-16 ENCOUNTER — Encounter: Payer: Self-pay | Admitting: Cardiology

## 2011-06-16 ENCOUNTER — Ambulatory Visit (INDEPENDENT_AMBULATORY_CARE_PROVIDER_SITE_OTHER): Payer: Medicare Other | Admitting: Cardiology

## 2011-06-16 VITALS — BP 135/62 | HR 78 | Ht 73.0 in | Wt 209.1 lb

## 2011-06-16 DIAGNOSIS — I959 Hypotension, unspecified: Secondary | ICD-10-CM

## 2011-06-16 DIAGNOSIS — R002 Palpitations: Secondary | ICD-10-CM

## 2011-06-16 MED ORDER — MIDODRINE HCL 5 MG PO TABS: 5.0000 mg | ORAL_TABLET | Freq: Three times a day (TID) | ORAL | Status: DC

## 2011-06-16 NOTE — Assessment & Plan Note (Signed)
I will try to increase his midodrine to 10 mg po tid daily to see if this helps his BP.

## 2011-06-16 NOTE — Assessment & Plan Note (Signed)
He has had no symptoms.  He had a low risk study in the past.  No change in therapy or further imaging is indicated.

## 2011-06-16 NOTE — Patient Instructions (Addendum)
Please increase your Midodrine to 5 mg three times a day (first thing in the am, 12N and 3 pm) Continue all other medications as listed  Follow up with Dr Percival Spanish in 2 months

## 2011-06-16 NOTE — Assessment & Plan Note (Signed)
The patient understands the need to lose weight with diet and exercise.

## 2011-06-16 NOTE — Progress Notes (Signed)
   HPI The patient presents for evaluation of hypotension. I had seen him in the past following a non-Q wave myocardial infarction. In 2011 he had a low risk perfusion study with a possible old inferior infarct. He has end-stage renal disease secondary to hypertension. However, he has been bothered by hypotension more recently.  He does take midodrine 10 mg one hour before dialysis.  However, his blood pressure still falls with dialysis. He does not report syncope or presyncope. He does not report chest pressure, neck or arm discomfort. He has occasional mild shortness of breath but no PND or orthopnea. He has no weight gain or edema.  No Known Allergies  Current Outpatient Prescriptions  Medication Sig Dispense Refill  . Calcium Acetate 667 MG TABS Take by mouth. Take 2 tabs tid      . midodrine (PROAMATINE) 2.5 MG tablet Take 2.5 mg by mouth as directed. Take 4 tabs 1 hour before each treatment        Past Medical History  Diagnosis Date  . Hypertension   . Gout   . Anemia   . Dyslipidemia   . Obesity   . Volume overload   . CKD (chronic kidney disease) stage 4, GFR 15-29 ml/min   . MYOCARDIAL INFARCTION, ACUTE, NON-Q WAVE 04/26/2009    Qualifier: Diagnosis of  By: Percival Spanish MD, Farrel Gordon      Past Surgical History  Procedure Date  . Arteriovenous graft placement    ROS: As stated in the HPI and negative for all other systems.  PHYSICAL EXAM BP 135/62  Pulse 78  Ht 6\' 1"  (1.854 m)  Wt 209 lb 1.9 oz (94.856 kg)  BMI 27.59 kg/m2 GENERAL:  Well appearing HEENT:  Pupils equal round and reactive, fundi not visualized, oral mucosa unremarkable NECK:  No jugular venous distention, waveform within normal limits, carotid upstroke brisk and symmetric, no bruits, no thyromegaly LYMPHATICS:  No cervical, inguinal adenopathy LUNGS:  Clear to auscultation bilaterally BACK:  No CVA tenderness CHEST:  Unremarkable HEART:  PMI not displaced or sustained,S1 and S2 within normal limits,  no S3, no S4, no clicks, no rubs, no murmurs ABD:  Flat, positive bowel sounds normal in frequency in pitch, no bruits, no rebound, no guarding, no midline pulsatile mass, no hepatomegaly, no splenomegaly EXT:  2 plus pulses throughout, no edema, no cyanosis no clubbing, right upper arm fistula SKIN:  No rashes no nodules NEURO:  Cranial nerves II through XII grossly intact, motor grossly intact throughout PSYCH:  Cognitively intact, oriented to person place and time  EKG:  Sinus rhythm, rate 70, axis within normal limits, intervals within normal limits, no acute ST-T wave changes.  ASSESSMENT AND PLAN

## 2011-06-21 ENCOUNTER — Encounter: Payer: Self-pay | Admitting: Cardiology

## 2011-07-03 ENCOUNTER — Ambulatory Visit: Payer: Self-pay | Admitting: Cardiology

## 2011-08-23 ENCOUNTER — Encounter: Payer: Self-pay | Admitting: Cardiology

## 2011-08-23 ENCOUNTER — Ambulatory Visit (INDEPENDENT_AMBULATORY_CARE_PROVIDER_SITE_OTHER): Payer: Medicare Other | Admitting: Cardiology

## 2011-08-23 VITALS — BP 140/62 | HR 71 | Ht 73.0 in | Wt 207.4 lb

## 2011-08-23 DIAGNOSIS — E785 Hyperlipidemia, unspecified: Secondary | ICD-10-CM

## 2011-08-23 DIAGNOSIS — I959 Hypotension, unspecified: Secondary | ICD-10-CM

## 2011-08-23 DIAGNOSIS — I10 Essential (primary) hypertension: Secondary | ICD-10-CM

## 2011-08-23 DIAGNOSIS — I214 Non-ST elevation (NSTEMI) myocardial infarction: Secondary | ICD-10-CM

## 2011-08-23 NOTE — Progress Notes (Signed)
    HPI The patient presents for evaluation of hypotension. I had seen him in the past following a non-Q wave myocardial infarction. In 2011 he had a low risk perfusion study with a possible old inferior infarct. He has end-stage renal disease secondary to hypertension. At the time I saw him recently he was having hypotensive episodes particularly during dialysis. He was being treated with midodrine.  However, interestingly since the last visit he he has had no further hypotensive episodes. He has been taken off of the midodrine.  He has otherwise not changed his regimen although he might be taking more calcium. He says he feels well and has no presyncope or syncope. He's not having any chest pressure, neck or arm discomfort. He's having no shortness of breath, PND or orthopnea.  No Known Allergies  Current Outpatient Prescriptions  Medication Sig Dispense Refill  . Calcium Acetate 667 MG TABS Take by mouth. Take  2 tabs in am  1 tab in pm        Past Medical History  Diagnosis Date  . Hypertension   . Gout   . Anemia   . Dyslipidemia   . Obesity   . Volume overload   . CKD (chronic kidney disease) stage 4, GFR 15-29 ml/min   . MYOCARDIAL INFARCTION, ACUTE, NON-Q WAVE 04/26/2009    Qualifier: Diagnosis of  By: Percival Spanish MD, Farrel Gordon      Past Surgical History  Procedure Date  . Arteriovenous graft placement    ROS: As stated in the HPI and negative for all other systems.  PHYSICAL EXAM BP 140/62  Pulse 71  Ht 6\' 1"  (1.854 m)  Wt 207 lb 6.4 oz (94.076 kg)  BMI 27.36 kg/m2 GENERAL:  Well appearing HEENT:  Pupils equal round and reactive, fundi not visualized, oral mucosa unremarkable NECK:  No jugular venous distention, waveform within normal limits, carotid upstroke brisk and symmetric, transmitted bruits, no thyromegaly LUNGS:  Clear to auscultation bilaterally BACK:  No CVA tenderness CHEST:  Unremarkable HEART:  PMI not displaced or sustained,S1 and S2 within normal  limits, no S3, no S4, no clicks, no rubs, no murmurs, transmitted bruit. ABD:  Flat, positive bowel sounds normal in frequency in pitch, no bruits, no rebound, no guarding, no midline pulsatile mass, no hepatomegaly, no splenomegaly EXT:  2 plus pulses throughout, no edema, no cyanosis no clubbing, right upper arm fistula   ASSESSMENT AND PLAN  Hypotension -  This has resolved. No further workup is suggested. No change in therapy is indicated.  He can come back as needed.   MYOCARDIAL INFARCTION, ACUTE, NON-Q WAVE -  He has had no symptoms. He had a low risk study in the past. No change in therapy or further imaging is indicated.

## 2011-08-23 NOTE — Patient Instructions (Addendum)
Your physician recommends that you schedule a follow-up appointment in: as needed  

## 2012-12-26 ENCOUNTER — Inpatient Hospital Stay (HOSPITAL_COMMUNITY)
Admission: EM | Admit: 2012-12-26 | Discharge: 2013-01-07 | DRG: 233 | Disposition: A | Discharge status: Home / Self Care | Payer: Medicare Other | Attending: Cardiothoracic Surgery | Admitting: Cardiothoracic Surgery

## 2012-12-26 ENCOUNTER — Emergency Department (HOSPITAL_COMMUNITY): Payer: Medicare Other

## 2012-12-26 ENCOUNTER — Encounter (HOSPITAL_COMMUNITY): Payer: Self-pay | Admitting: Emergency Medicine

## 2012-12-26 DIAGNOSIS — I10 Essential (primary) hypertension: Secondary | ICD-10-CM | POA: Diagnosis present

## 2012-12-26 DIAGNOSIS — E669 Obesity, unspecified: Secondary | ICD-10-CM | POA: Diagnosis present

## 2012-12-26 DIAGNOSIS — N184 Chronic kidney disease, stage 4 (severe): Secondary | ICD-10-CM

## 2012-12-26 DIAGNOSIS — E785 Hyperlipidemia, unspecified: Secondary | ICD-10-CM | POA: Diagnosis present

## 2012-12-26 DIAGNOSIS — I252 Old myocardial infarction: Secondary | ICD-10-CM

## 2012-12-26 DIAGNOSIS — Z79899 Other long term (current) drug therapy: Secondary | ICD-10-CM

## 2012-12-26 DIAGNOSIS — N186 End stage renal disease: Secondary | ICD-10-CM | POA: Diagnosis present

## 2012-12-26 DIAGNOSIS — E8779 Other fluid overload: Secondary | ICD-10-CM | POA: Diagnosis present

## 2012-12-26 DIAGNOSIS — M899 Disorder of bone, unspecified: Secondary | ICD-10-CM | POA: Diagnosis present

## 2012-12-26 DIAGNOSIS — E119 Type 2 diabetes mellitus without complications: Secondary | ICD-10-CM | POA: Diagnosis present

## 2012-12-26 DIAGNOSIS — N2581 Secondary hyperparathyroidism of renal origin: Secondary | ICD-10-CM | POA: Diagnosis present

## 2012-12-26 DIAGNOSIS — R079 Chest pain, unspecified: Secondary | ICD-10-CM

## 2012-12-26 DIAGNOSIS — I2510 Atherosclerotic heart disease of native coronary artery without angina pectoris: Secondary | ICD-10-CM | POA: Diagnosis present

## 2012-12-26 DIAGNOSIS — I959 Hypotension, unspecified: Secondary | ICD-10-CM | POA: Diagnosis not present

## 2012-12-26 DIAGNOSIS — Z992 Dependence on renal dialysis: Secondary | ICD-10-CM

## 2012-12-26 DIAGNOSIS — Z87891 Personal history of nicotine dependence: Secondary | ICD-10-CM

## 2012-12-26 DIAGNOSIS — I5033 Acute on chronic diastolic (congestive) heart failure: Secondary | ICD-10-CM | POA: Diagnosis present

## 2012-12-26 DIAGNOSIS — I200 Unstable angina: Secondary | ICD-10-CM

## 2012-12-26 DIAGNOSIS — I214 Non-ST elevation (NSTEMI) myocardial infarction: Principal | ICD-10-CM | POA: Diagnosis present

## 2012-12-26 DIAGNOSIS — Z951 Presence of aortocoronary bypass graft: Secondary | ICD-10-CM

## 2012-12-26 DIAGNOSIS — M109 Gout, unspecified: Secondary | ICD-10-CM | POA: Diagnosis present

## 2012-12-26 DIAGNOSIS — I120 Hypertensive chronic kidney disease with stage 5 chronic kidney disease or end stage renal disease: Secondary | ICD-10-CM | POA: Diagnosis present

## 2012-12-26 DIAGNOSIS — D62 Acute posthemorrhagic anemia: Secondary | ICD-10-CM | POA: Diagnosis not present

## 2012-12-26 DIAGNOSIS — D500 Iron deficiency anemia secondary to blood loss (chronic): Secondary | ICD-10-CM | POA: Diagnosis present

## 2012-12-26 DIAGNOSIS — I25810 Atherosclerosis of coronary artery bypass graft(s) without angina pectoris: Secondary | ICD-10-CM | POA: Diagnosis present

## 2012-12-26 HISTORY — DX: Dependence on renal dialysis: Z99.2

## 2012-12-26 HISTORY — DX: Anemia in other chronic diseases classified elsewhere: D63.8

## 2012-12-26 HISTORY — DX: Type 2 diabetes mellitus without complications: E11.9

## 2012-12-26 HISTORY — DX: End stage renal disease: N18.6

## 2012-12-26 LAB — BASIC METABOLIC PANEL
BUN: 11 mg/dL (ref 6–23)
CO2: 36 mEq/L — ABNORMAL HIGH (ref 19–32)
Calcium: 9.7 mg/dL (ref 8.4–10.5)
Chloride: 98 mEq/L (ref 96–112)
Creatinine, Ser: 6.46 mg/dL — ABNORMAL HIGH (ref 0.50–1.35)
GFR calc Af Amer: 8 mL/min — ABNORMAL LOW (ref >90)
GFR calc non Af Amer: 7 mL/min — ABNORMAL LOW (ref >90)
Glucose, Bld: 130 mg/dL — ABNORMAL HIGH (ref 70–99)
Potassium: 3.5 mEq/L (ref 3.5–5.1)
Sodium: 143 mEq/L (ref 135–145)

## 2012-12-26 LAB — CBC
HCT: 30.3 % — ABNORMAL LOW (ref 39.0–52.0)
Hemoglobin: 10.4 g/dL — ABNORMAL LOW (ref 13.0–17.0)
MCH: 31.4 pg (ref 26.0–34.0)
MCHC: 34.3 g/dL (ref 30.0–36.0)
MCV: 91.5 fL (ref 78.0–100.0)
Platelets: 216 10*3/uL (ref 150–400)
RBC: 3.31 MIL/uL — ABNORMAL LOW (ref 4.22–5.81)
RDW: 12.8 % (ref 11.5–15.5)
WBC: 5.3 10*3/uL (ref 4.0–10.5)

## 2012-12-26 LAB — POCT I-STAT TROPONIN I: Troponin i, poc: 0.04 ng/mL (ref 0.00–0.08)

## 2012-12-26 LAB — PROTIME-INR
INR: 1.1 (ref 0.00–1.49)
Prothrombin Time: 14 seconds (ref 11.6–15.2)

## 2012-12-26 LAB — TROPONIN I: Troponin I: <0.3 ng/mL (ref <0.30)

## 2012-12-26 MED ORDER — SODIUM CHLORIDE 0.9 % IJ SOLN: 3.0000 mL | INTRAMUSCULAR | Status: DC | PRN

## 2012-12-26 MED ORDER — SODIUM CHLORIDE 0.9 % IJ SOLN: 3.0000 mL | Freq: Two times a day (BID) | INTRAMUSCULAR | Status: DC

## 2012-12-26 MED ORDER — ENOXAPARIN SODIUM 40 MG/0.4ML ~~LOC~~ SOLN: 40.0000 mg | SUBCUTANEOUS | Status: DC

## 2012-12-26 MED ORDER — SODIUM CHLORIDE 0.9 % IV SOLN
1.0000 mL/kg/h | INTRAVENOUS | Status: DC
  Administered 2012-12-26: 1 mL/kg/h via INTRAVENOUS

## 2012-12-26 MED ORDER — ONDANSETRON HCL 4 MG/2ML IJ SOLN: 4.0000 mg | Freq: Four times a day (QID) | INTRAMUSCULAR | Status: DC | PRN

## 2012-12-26 MED ORDER — SODIUM CHLORIDE 0.9 % IV SOLN: 250.0000 mL | INTRAVENOUS | Status: DC | PRN

## 2012-12-26 MED ORDER — ZOLPIDEM TARTRATE 5 MG PO TABS: 5.0000 mg | ORAL_TABLET | Freq: Every evening | ORAL | Status: DC | PRN

## 2012-12-26 MED ORDER — PANTOPRAZOLE SODIUM 40 MG PO TBEC
40.0000 mg | DELAYED_RELEASE_TABLET | Freq: Every day | ORAL | Status: DC
  Administered 2012-12-26 – 2012-12-30 (×4): 40 mg via ORAL
  Filled 2012-12-26 (×4): qty 1

## 2012-12-26 MED ORDER — CALCIUM ACETATE 667 MG PO CAPS
667.0000 mg | ORAL_CAPSULE | ORAL | Status: DC | PRN
  Filled 2012-12-26: qty 1

## 2012-12-26 MED ORDER — SODIUM CHLORIDE 0.9 % IJ SOLN
3.0000 mL | Freq: Two times a day (BID) | INTRAMUSCULAR | Status: DC
  Administered 2012-12-27 – 2012-12-30 (×6): 3 mL via INTRAVENOUS

## 2012-12-26 MED ORDER — CALCIUM ACETATE 667 MG PO TABS: 1.0000 | ORAL_TABLET | ORAL | Status: DC

## 2012-12-26 MED ORDER — METOPROLOL TARTRATE 25 MG PO TABS
25.0000 mg | ORAL_TABLET | Freq: Two times a day (BID) | ORAL | Status: DC
  Administered 2012-12-26 – 2012-12-30 (×8): 25 mg via ORAL
  Filled 2012-12-26 (×12): qty 1

## 2012-12-26 MED ORDER — NITROGLYCERIN 0.4 MG SL SUBL: 0.4000 mg | SUBLINGUAL_TABLET | SUBLINGUAL | Status: DC | PRN

## 2012-12-26 MED ORDER — ENOXAPARIN SODIUM 30 MG/0.3ML ~~LOC~~ SOLN
30.0000 mg | SUBCUTANEOUS | Status: DC
  Administered 2012-12-26 – 2012-12-29 (×4): 30 mg via SUBCUTANEOUS
  Filled 2012-12-26 (×5): qty 0.3

## 2012-12-26 MED ORDER — ASPIRIN 81 MG PO CHEW
81.0000 mg | CHEWABLE_TABLET | ORAL | Status: AC
  Administered 2012-12-27: 81 mg via ORAL
  Filled 2012-12-26: qty 1

## 2012-12-26 MED ORDER — ACETAMINOPHEN 325 MG PO TABS
650.0000 mg | ORAL_TABLET | ORAL | Status: DC | PRN
  Filled 2012-12-26: qty 2

## 2012-12-26 MED ORDER — CALCIUM ACETATE 667 MG PO CAPS
1334.0000 mg | ORAL_CAPSULE | Freq: Three times a day (TID) | ORAL | Status: DC
  Administered 2012-12-28 – 2012-12-29 (×2): 1334 mg via ORAL
  Administered 2012-12-29 – 2012-12-30 (×3): 2001 mg via ORAL
  Filled 2012-12-26 (×16): qty 3

## 2012-12-26 MED ORDER — ASPIRIN 81 MG PO CHEW
81.0000 mg | CHEWABLE_TABLET | Freq: Every day | ORAL | Status: DC
  Administered 2012-12-26 – 2012-12-30 (×4): 81 mg via ORAL
  Filled 2012-12-26 (×4): qty 1

## 2012-12-26 NOTE — ED Notes (Signed)
Pt was about 2 hours into dialysis when he developed CP, SOB and began sweating. Dialysis was stopped and upon EMS arrival, pt's symptoms had resolved. EMS gave pt 324 of ASA, CBG 99, systolic BP 0000000. EMS obtained EKG which showed ST depression.

## 2012-12-26 NOTE — ED Provider Notes (Signed)
CSN: [id number]     Arrival date & time 12/26/12  0905 History   First MD Initiated Contact with Patient 12/26/12 0919     Chief Complaint  Patient presents with  . Chest Pain   (Consider location/radiation/quality/duration/timing/severity/associated sxs/prior Treatment) HPI  79 year old male with chest pain, shortness of breath and diaphoresis. Onset around 8:30. Patient was getting dialysis when he began feeling "tight" in the center ofhis chest. Associated with diaphoresis and shortness of breath. This episode lasted approximately 15 minutes and then slowly abated. He was asymptomatic by the time EMS arrived. SBP when they arrived was in 160s. Unsure of vitals at time of symptoms.  He was given 324 mg of aspirin by EMS. Patient reports an episode of very similar symptoms yesterday when getting something to drink in his kitchen. He sat down and symptoms went away after about 10 minutes. He also had an episode of unusual shortness of breath this past Monday. He was on a riding lawnmower doing yard work and felt fine. When he went to put his mower up though he began feeling short of breath and had to sit back down. He did not have any chest pain nor was he diaphoretic at that time though.  Past hx of a non-Q-wave infarct. Admission 01/2009 with elevated cardiac enzymes after presenting with dyspnea and LE edema. He did have a subsequent outpatient stress perfusion study which suggested perhaps a very small area of inferior infarct. However, it was felt that the enzyme elevation and volume overload were related to his ESRD. He has seemed to be doing well from a cardiac standpoint since that time. Last seen by Dr Percival Spanish 18 months ago.   Received 2-2.5 hours or dialysis today. Reports compliance with medications. Currently no complaints. Routine appointment with PCP, Dr Arelia Sneddon, 1 week ago. Had blood work then including lipids, but unaware of results.    Past Medical History  Diagnosis Date  .  Hypertension   . Gout   . Anemia   . Dyslipidemia   . Obesity   . Volume overload   . CKD (chronic kidney disease) stage 4, GFR 15-29 ml/min   . MYOCARDIAL INFARCTION, ACUTE, NON-Q WAVE 04/26/2009    Qualifier: Diagnosis of  By: Percival Spanish MD, Farrel Gordon     Past Surgical History  Procedure Laterality Date  . Arteriovenous graft placement     History reviewed. No pertinent family history. History  Substance Use Topics  . Smoking status: Former Smoker    Types: Cigarettes    Quit date: 06/16/1995  . Smokeless tobacco: Not on file  . Alcohol Use: Yes     Comment: occasional    Review of Systems  All systems reviewed and negative, other than as noted in HPI.   Allergies  Review of patient's allergies indicates no known allergies.  Home Medications   Current Outpatient Rx  Name  Route  Sig  Dispense  Refill  . Calcium Acetate 667 MG TABS   Oral   Take by mouth. Take  2 tabs in am  1 tab in pm          BP 174/61  Pulse 73  Temp(Src) 97.9 F (36.6 C) (Oral)  Resp 20  Ht 6\' 1"  (1.854 m)  Wt 207 lb (93.895 kg)  BMI 27.32 kg/m2  SpO2 99% Physical Exam  Nursing note and vitals reviewed. Constitutional: He appears well-developed and well-nourished. No distress.  Laying in bed. NAD.   HENT:  Head: Normocephalic and  atraumatic.  Eyes: Conjunctivae are normal. Right eye exhibits no discharge. Left eye exhibits no discharge.  Neck: Neck supple.  Cardiovascular: Normal rate and regular rhythm.  Exam reveals no gallop and no friction rub.   Murmur heard. Systolic murmur  Pulmonary/Chest: Effort normal and breath sounds normal. No respiratory distress.  Abdominal: Soft. He exhibits no distension. There is no tenderness.  Musculoskeletal: He exhibits no edema and no tenderness.  Mild pitting edema. Lower extremities symmetric as compared to each other. No calf tenderness. Negative Homan's. No palpable cords.    Neurological: He is alert.  Skin: Skin is warm and dry.   Psychiatric: He has a normal mood and affect. His behavior is normal. Thought content normal.    ED Course  Procedures (including critical care time) Labs Review Labs Reviewed  CBC - Abnormal; Notable for the following:    RBC 3.31 (*)    Hemoglobin 10.4 (*)    HCT 30.3 (*)    All other components within normal limits  BASIC METABOLIC PANEL - Abnormal; Notable for the following:    CO2 36 (*)    Glucose, Bld 130 (*)    Creatinine, Ser 6.46 (*)    GFR calc non Af Amer 7 (*)    GFR calc Af Amer 8 (*)    All other components within normal limits  LIPID PANEL - Abnormal; Notable for the following:    HDL 36 (*)    LDL Cholesterol 107 (*)    All other components within normal limits  CBC - Abnormal; Notable for the following:    RBC 3.04 (*)    Hemoglobin 9.4 (*)    HCT 28.5 (*)    All other components within normal limits  RENAL FUNCTION PANEL - Abnormal; Notable for the following:    Creatinine, Ser 10.03 (*)    Albumin 3.2 (*)    GFR calc non Af Amer 4 (*)    GFR calc Af Amer 5 (*)    All other components within normal limits  CBC - Abnormal; Notable for the following:    RBC 2.88 (*)    Hemoglobin 8.8 (*)    HCT 26.6 (*)    All other components within normal limits  RENAL FUNCTION PANEL - Abnormal; Notable for the following:    Creatinine, Ser 6.57 (*)    Albumin 2.9 (*)    GFR calc non Af Amer 7 (*)    GFR calc Af Amer 8 (*)    All other components within normal limits  CBC - Abnormal; Notable for the following:    RBC 3.01 (*)    Hemoglobin 9.1 (*)    HCT 27.9 (*)    All other components within normal limits  RENAL FUNCTION PANEL - Abnormal; Notable for the following:    Sodium 134 (*)    Chloride 95 (*)    Glucose, Bld 114 (*)    BUN 30 (*)    Creatinine, Ser 8.78 (*)    Albumin 3.0 (*)    GFR calc non Af Amer 5 (*)    GFR calc Af Amer 6 (*)    All other components within normal limits  TROPONIN I  TROPONIN I  TROPONIN I  PROTIME-INR  TSH   HEMOGLOBIN A1C  GLUCOSE, CAPILLARY  POCT I-STAT TROPONIN I   Imaging Review No results found.  Dg Chest 2 View  12/26/2012   CLINICAL DATA:  Shortness of breath, chest pain  EXAM: CHEST  2  VIEW  COMPARISON:  None.  FINDINGS: Bilateral diffuse interstitial thickening and peribronchial cuffing most concerning for bronchitis. There is no focal parenchymal opacity, pleural effusion, or pneumothorax. The heart and mediastinal contours are unremarkable.  The osseous structures are unremarkable.  IMPRESSION: Bilateral diffuse interstitial thickening and peribronchial cuffing most concerning for bronchitis.   Electronically Signed   By: Kathreen Devoid   On: 12/26/2012 10:00   EKG Interpretation    Date/Time:  Thursday December 26 2012 09:12:31 EST Ventricular Rate:  71 PR Interval:  180 QRS Duration: 107 QT Interval:  406 QTC Calculation: 441 R Axis:   -3 Text Interpretation:  Sinus rhythm repolarization abnormalities, likely from LVH. Noted on previous 03/01/09 but more pronounced on current.  Confirmed by Wilson Singer  MD, [place] ([id number]) on 12/26/2012 10:01:05 AM            MDM   1. Chest pain   2. Chronic kidney disease, stage IV (severe)   3. Unstable angina   4. Coronary atherosclerosis of native coronary artery      82yM with CP, SOB and diaphoresis. Second episode in as many days with an additional episode of dyspnea only on Monday. Concerning for unstable angina/ACS.  ASA pre-hospital. Currently asymptomatic. EKG with repolarization abnormalities 2/2 LVH. Not too much change from previous. Will check basic labs, enzymes and CXR. Will discuss with cards. Did not finish dialysis today, but clinically not markedly volume overloaded.     Virgel Manifold, MD 12/30/12 [phone]

## 2012-12-26 NOTE — ED Notes (Signed)
Called Dialysis to de access pt's dialysis right upper arm site.

## 2012-12-26 NOTE — ED Notes (Signed)
EMS also reports that pt had similar episode of chest pain described as squeezing pain for about 15 minutes. Pt was also sweaty and SOB then the episode resolved.

## 2012-12-26 NOTE — H&P (Signed)
CARDIOLOGY CONSULT NOTE   Patient ID: [name] [name] MRN: [id number] DOB/AGE: 08/06/1930 42 y.o.  Admit date: 12/26/2012  Primary Physician   [name], MD Primary Cardiologist   Dr. Percival Spanish Reason for Consultation   Chest pain   Filipe Zumbach is an 79 y.o. male with a history of HTN, HLD, obesity, anemia, CKD on dialysis and NSTEMI (2011; Lexiscan myovewi with inferior scar, normal LVEF) who is being evaluated today for chest pain associated with SOB and diaphoresis during dialysis today. The dialysis center called EMS; however, he was asymptomatic by the time EMS arrived. He currently has no symptoms.  He reports chest pain, SOB, diaphoresis, and vomiting off and on over the past 2 months. Dr. Arelia Sneddon, his PCP, gave him a "brown capsule" (? omeprazole) which did not help.   At dialysis today he experienced chest pain, shortness of breath and diaphoresis which lasted about 15 minutes and then slowly abated, although he did not vomit today. He rates his chest pain as a 5/10 and describes it as bandlike pressure across his epigastrium. He denies radiation, palpations, dizziness, lightheadedness, presyncope or abdominal pain. It is not worse with any movement or with inspiration. He reports that is reminiscent of his previous cardiac chest pain in 2011. Patient is somewhat active; he rides and cuts the grass with more SOB.   Note the  has never had a cardiac cath.      Past Medical History  Diagnosis Date  . Hypertension   . Gout   . Anemia   . Dyslipidemia   . Obesity   . Volume overload   . CKD (chronic kidney disease) stage 4, GFR 15-29 ml/min   . MYOCARDIAL INFARCTION, ACUTE, NON-Q WAVE 04/26/2009    Qualifier: Diagnosis of  By: Percival Spanish, MD, Farrel Gordon    . Chest pain      Past Surgical History  Procedure Laterality Date  . Arteriovenous graft placement      No Known Allergies  I have reviewed the patient's current medications  Prior to Admission  medications   Medication Sig Start Date End Date Taking? Authorizing Provider  Calcium Acetate 667 MG TABS Take 1-3 tablets by mouth See admin instructions. 1 tablet with a small snack, 2 tablets with a small meal, and 3 tablets with a large meal.   Yes Historical Provider, MD  omeprazole (PRILOSEC) 20 MG capsule Take 20 mg by mouth 2 (two) times daily.    Yes Historical Provider, MD     History   Social History  . Marital Status: Married    Spouse Name: N/A    Number of Children: 2  . Years of Education: N/A   Occupational History  . retired     Administrator   Social History Main Topics  . Smoking status: Former Smoker -- 1.00 packs/day for 40 years    Types: Cigarettes    Quit date: 06/16/1995  . Smokeless tobacco: Not on file     Comment: quit one year ago  . Alcohol Use: No     Comment: occasional  . Drug Use: Not on file  . Sexual Activity: Not on file   Other Topics Concern  . 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 He has two sons     Patient's family history is unknown to patient. He said he was "out of town when his parents  died."   ROS:  Full 14 point review of systems complete and found to be negative unless listed above.  Physical Exam: Blood pressure 179/62, pulse 70, temperature 97.9 F (36.6 C), temperature source Oral, resp. rate 15, height 6\' 1"  (1.854 m), weight 207 lb (93.895 kg), SpO2 97.00%.  General: Well developed, well nourished, male in no acute distress HEENT: Pupils equal round and reactive, oral mucosa unremarkable  NECK: No JVD, carotid upstroke brisk and symmetric, LUNGS: Clear to auscultation bilaterally  CHEST: Unremarkable  HEART: PMI not displaced or sustained,S1 and S2 within normal limits, Systolic murmur heard ABD: Flat, non distended. Normal bowel sounds EXT: 2 plus pulses throughout, no edema, no cyanosis no clubbing,  Fistula in right upper arm  EKG  SR 71   LVH with repol abnormality , cannot exclude ischemia  Labs:   Lab Results  Component Value Date   WBC 5.3 12/26/2012   HGB 10.4* 12/26/2012   HCT 30.3* 12/26/2012   MCV 91.5 12/26/2012   PLT 216 12/26/2012      Recent Labs Lab 12/26/12 0931  NA 143  K 3.5  CL 98  CO2 36*  BUN 11  CREATININE 6.46*  CALCIUM 9.7  GLUCOSE 130*    Recent Labs  12/26/12 0944  TROPIPOC 0.04    Echo: Study Date: 02/01/2009 tudy Conclusions - Left ventricle: Very difficult acoustic windows limit study. Overall LV systolic function appears grossly normal. Cannot fully evaluate regional wall motion as endocardium is not well seen. The cavity size was normal. Wall thickness was increased in a pattern of mild LVH. Doppler parameters are consistent with abnormal left ventricular relaxation (grade 1 diastolic dysfunction).    Radiology:  Dg Chest 2 View  12/26/2012   CLINICAL DATA:  Shortness of breath, chest pain  EXAM: CHEST  2 VIEW  COMPARISON:  None.  FINDINGS: Bilateral diffuse interstitial thickening and peribronchial cuffing most concerning for bronchitis. There is no focal parenchymal opacity, pleural effusion, or pneumothorax. The heart and mediastinal contours are unremarkable.  The osseous structures are unremarkable.  IMPRESSION: Bilateral diffuse interstitial thickening and peribronchial cuffing most concerning for bronchitis.   Electronically Signed   By: Kathreen Devoid   On: 12/26/2012 10:00    ASSESSMENT AND PLAN:    Active Problems:   Chest pain   DYSLIPIDEMIA   GOUT   OBESITY   HYPERTENSION   MYOCARDIAL INFARCTION, ACUTE, NON-Q WAVE   RENAL DISEASE, CHRONIC, STAGE IV  Impression:  Chest pain-- currently without chest pain, however his symptoms are rel new, associated with activity and worrisome for progressive angina.  Myoview was not normal in 2011 though no ischemia  Symptoms different now.  Recom:  -- admit to  telemetry, serial troponin -- Patient given ASA 324,  start Asprin 81 tomorrow -- Continue omeprazole  -- NPO at midnight, cath in the morning Begin metoprolol  HTN - start 25 metoprolol  BID  HLD- Lipids in the morning Patient is not on statin but should be  Anemia- stable Hg 10.4 which is close to his baseline  Follow.     SignedPerry Mount, PA-C 12/26/2012 3:21 PM  Patinet seen and exmained.  I have amended report to reflect my findings  Plan on cath in AM>

## 2012-12-27 DIAGNOSIS — R079 Chest pain, unspecified: Secondary | ICD-10-CM

## 2012-12-27 DIAGNOSIS — N189 Chronic kidney disease, unspecified: Secondary | ICD-10-CM

## 2012-12-27 DIAGNOSIS — I10 Essential (primary) hypertension: Secondary | ICD-10-CM

## 2012-12-27 LAB — RENAL FUNCTION PANEL
Albumin: 3.2 g/dL — ABNORMAL LOW (ref 3.5–5.2)
BUN: 23 mg/dL (ref 6–23)
CO2: 30 mEq/L (ref 19–32)
Calcium: 9.4 mg/dL (ref 8.4–10.5)
Chloride: 98 mEq/L (ref 96–112)
Creatinine, Ser: 10.03 mg/dL — ABNORMAL HIGH (ref 0.50–1.35)
GFR calc Af Amer: 5 mL/min — ABNORMAL LOW (ref >90)
Glucose, Bld: 95 mg/dL (ref 70–99)
Phosphorus: 3.6 mg/dL (ref 2.3–4.6)

## 2012-12-27 LAB — CBC
HCT: 28.5 % — ABNORMAL LOW (ref 39.0–52.0)
Hemoglobin: 9.4 g/dL — ABNORMAL LOW (ref 13.0–17.0)
MCH: 30.9 pg (ref 26.0–34.0)
MCV: 93.8 fL (ref 78.0–100.0)
RBC: 3.04 MIL/uL — ABNORMAL LOW (ref 4.22–5.81)
WBC: 6.3 10*3/uL (ref 4.0–10.5)

## 2012-12-27 LAB — HEMOGLOBIN A1C
Hgb A1c MFr Bld: 5.5 % (ref <5.7)
Mean Plasma Glucose: 111 mg/dL (ref <117)

## 2012-12-27 LAB — TROPONIN I
Troponin I: <0.3 ng/mL (ref <0.30)
Troponin I: <0.3 ng/mL (ref <0.30)

## 2012-12-27 LAB — LIPID PANEL
HDL: 36 mg/dL — ABNORMAL LOW (ref >39)
Triglycerides: 136 mg/dL (ref <150)

## 2012-12-27 MED ORDER — HEPARIN SODIUM (PORCINE) 1000 UNIT/ML DIALYSIS
1000.0000 [IU] | INTRAMUSCULAR | Status: DC | PRN
  Filled 2012-12-27: qty 1

## 2012-12-27 MED ORDER — SODIUM CHLORIDE 0.9 % IV SOLN: 100.0000 mL | INTRAVENOUS | Status: DC | PRN

## 2012-12-27 MED ORDER — ASPIRIN 81 MG PO CHEW: 81.0000 mg | CHEWABLE_TABLET | ORAL | Status: DC

## 2012-12-27 MED ORDER — HEPARIN SODIUM (PORCINE) 1000 UNIT/ML DIALYSIS: 1000.0000 [IU] | INTRAMUSCULAR | Status: DC | PRN

## 2012-12-27 MED ORDER — LIDOCAINE-PRILOCAINE 2.5-2.5 % EX CREA
1.0000 "application " | TOPICAL_CREAM | CUTANEOUS | Status: DC | PRN
  Filled 2012-12-27: qty 5

## 2012-12-27 MED ORDER — DOXERCALCIFEROL 4 MCG/2ML IV SOLN
7.0000 ug | INTRAVENOUS | Status: DC
  Administered 2012-12-28 – 2012-12-30 (×2): 7 ug via INTRAVENOUS
  Filled 2012-12-27 (×2): qty 4

## 2012-12-27 MED ORDER — RENA-VITE PO TABS
1.0000 | ORAL_TABLET | Freq: Every day | ORAL | Status: DC
  Administered 2012-12-27 – 2012-12-30 (×4): 1 via ORAL
  Filled 2012-12-27 (×5): qty 1

## 2012-12-27 MED ORDER — LIDOCAINE-PRILOCAINE 2.5-2.5 % EX CREA: 1.0000 "application " | TOPICAL_CREAM | CUTANEOUS | Status: DC | PRN

## 2012-12-27 MED ORDER — LIDOCAINE HCL (PF) 1 % IJ SOLN: 5.0000 mL | INTRAMUSCULAR | Status: DC | PRN

## 2012-12-27 MED ORDER — SODIUM CHLORIDE 0.9 % IJ SOLN: 3.0000 mL | Freq: Two times a day (BID) | INTRAMUSCULAR | Status: DC

## 2012-12-27 MED ORDER — SODIUM CHLORIDE 0.9 % IJ SOLN: 3.0000 mL | INTRAMUSCULAR | Status: DC | PRN

## 2012-12-27 MED ORDER — SODIUM CHLORIDE 0.9 % IV SOLN: 250.0000 mL | INTRAVENOUS | Status: DC | PRN

## 2012-12-27 MED ORDER — HEPARIN SODIUM (PORCINE) 1000 UNIT/ML DIALYSIS: 8000.0000 [IU] | Freq: Once | INTRAMUSCULAR | Status: DC

## 2012-12-27 MED ORDER — PENTAFLUOROPROP-TETRAFLUOROETH EX AERO: 1.0000 "application " | INHALATION_SPRAY | CUTANEOUS | Status: DC | PRN

## 2012-12-27 MED ORDER — NEPRO/CARBSTEADY PO LIQD
237.0000 mL | ORAL | Status: DC | PRN
  Filled 2012-12-27: qty 237

## 2012-12-27 MED ORDER — HEPARIN SODIUM (PORCINE) 1000 UNIT/ML DIALYSIS
8000.0000 [IU] | INTRAMUSCULAR | Status: DC | PRN
  Filled 2012-12-27: qty 8

## 2012-12-27 MED ORDER — SODIUM CHLORIDE 0.9 % IV SOLN: 1.0000 mL/kg/h | INTRAVENOUS | Status: DC

## 2012-12-27 MED ORDER — ALTEPLASE 2 MG IJ SOLR: 2.0000 mg | Freq: Once | INTRAMUSCULAR | Status: DC | PRN

## 2012-12-27 MED ORDER — DARBEPOETIN ALFA-POLYSORBATE 25 MCG/0.42ML IJ SOLN
25.0000 ug | INTRAMUSCULAR | Status: DC
  Administered 2012-12-28: 25 ug via INTRAVENOUS
  Filled 2012-12-27: qty 0.42

## 2012-12-27 MED ORDER — ALTEPLASE 2 MG IJ SOLR
2.0000 mg | Freq: Once | INTRAMUSCULAR | Status: AC | PRN
  Filled 2012-12-27: qty 2

## 2012-12-27 MED ORDER — NEPRO/CARBSTEADY PO LIQD: 237.0000 mL | ORAL | Status: DC | PRN

## 2012-12-27 MED ORDER — LIDOCAINE HCL (PF) 1 % IJ SOLN
5.0000 mL | INTRAMUSCULAR | Status: DC | PRN
  Filled 2012-12-27: qty 5

## 2012-12-27 NOTE — Procedures (Signed)
I was present at this dialysis session, have reviewed the session itself and made  appropriate changes  Kelly Splinter MD (pgr) [phone]    ([phone] 12/27/2012, 6:27 PM

## 2012-12-27 NOTE — H&P (View-Only) (Signed)
   SUBJECTIVE:  No further chest pain.  No SOB   PHYSICAL EXAM Filed Vitals:   12/26/12 1700 12/26/12 1750 12/26/12 2117 12/27/12 0606  BP: 169/56 163/59 150/49 158/61  Pulse: 62 64 66 67  Temp:  98.5 F (36.9 C) 97.7 F (36.5 C) 97.7 F (36.5 C)  TempSrc:  Oral Oral Axillary  Resp: 15 16    Height:    6\' 1"  (1.854 m)  Weight:    213 lb 15.4 oz (97.052 kg)  SpO2: 98% 99% 98% 98%   General:  No distress Lungs:  Clear Heart:  RRR Abdomen:  Positive bowel sounds, no rebound no guarding Extremities:  No edema  LABS: Lab Results  Component Value Date   TROPONINI <0.30 12/27/2012   Results for orders placed during the hospital encounter of 12/26/12 (from the past 24 hour(s))  TROPONIN I     Status: None   Collection Time    12/26/12  7:00 PM      Result Value Range   Troponin I <0.30  <0.30 ng/mL  PROTIME-INR     Status: None   Collection Time    12/26/12  7:00 PM      Result Value Range   Prothrombin Time 14.0  11.6 - 15.2 seconds   INR 1.10  0.00 - 1.49  TSH     Status: None   Collection Time    12/26/12  7:00 PM      Result Value Range   TSH 0.914  0.350 - 4.500 uIU/mL  HEMOGLOBIN A1C     Status: None   Collection Time    12/26/12  7:00 PM      Result Value Range   Hemoglobin A1C 5.5  <5.7 %   Mean Plasma Glucose 111  <117 mg/dL  TROPONIN I     Status: None   Collection Time    12/27/12  1:00 AM      Result Value Range   Troponin I <0.30  <0.30 ng/mL  LIPID PANEL     Status: Abnormal   Collection Time    12/27/12  5:45 AM      Result Value Range   Cholesterol 170  0 - 200 mg/dL   Triglycerides 136  <150 mg/dL   HDL 36 (*) >39 mg/dL   Total CHOL/HDL Ratio 4.7     VLDL 27  0 - 40 mg/dL   LDL Cholesterol 107 (*) 0 - 99 mg/dL  TROPONIN I     Status: None   Collection Time    12/27/12  5:45 AM      Result Value Range   Troponin I <0.30  <0.30 ng/mL   No intake or output data in the 24 hours ending 12/27/12 1106   ASSESSMENT AND PLAN:  CHEST PAIN:   Ruled out.  However, given the high pretest probability of obstructive CAD cath is indicated. The patient understands that risks included but are not limited to stroke (1 in 1000), death (1 in 24), kidney failure [usually temporary] (1 in 500), bleeding (1 in 200), allergic reaction [possibly serious] (1 in 200).  The patient understands and agrees to proceed.   ESRD:  Dialysis tomorrow.  We will make sure that renal has been notified.    HTN:  BP elevated.  Beta blocker started.  Continue current therapy.    [name] [facility] 12/27/2012 11:06 AM

## 2012-12-27 NOTE — Consult Note (Addendum)
[facility] Renal Consultation Note  Indication for Consultation:  Management of ESRD/hemodialysis; anemia, hypertension/volume and secondary hyperparathyroidism  HPI: [name] [name] is a 79 y.o. male with ESRD on dialysis on TTS at the [facility] and a history of NSTEMI in 2011 who became diaphoretic with chest discomfort and dyspnea yesterday on dialysis and was sent to the ED after 2:23 of his 4-hour treatment for evaluation.  He reports frequent episodes, as much as once or twice a day for the last two months, during which he has shortness of breath with productive cough and becomes diaphoretic, which resolves with cold towels after several minutes.  He has seen his PCP, Dr. Arelia Sneddon, and was treated with medication, possibly a PPI, but had no improvement.  Today he presented for inpatient diagnostic cardiac catheterization per Dr. Martinique, but had increasing shortness of breath and was unable to lie flat for the procedure which was then postponed until Monday 12/22.  Although his dialysis was shortened yesterday, he is only 2.1 liters over his dry weight.  Currently he is comfortable without any complaints.   Dialysis Orders:   TTS @ [place] 4 hrs     94.5 kg    400/800    2K/2.25Ca    AVF @ [facility]     Heparin 8000 U    Hectorol 7 mcg      Epogen 1000 U        Venofer 0  Past Medical History  Diagnosis Date  . Hypertension   . Gout   . Dyslipidemia   . Obesity   . Volume overload   . MYOCARDIAL INFARCTION, ACUTE, NON-Q WAVE 04/26/2009    Qualifier: Diagnosis of  By: Percival Spanish, MD, Farrel Gordon    . Chest pain   . Anemia of chronic disease   . Shortness of breath     "comes up at anytime" (12/26/2012)  . Type II diabetes mellitus     "diet controlled" (12/26/2012)  . ESRD (end stage renal disease) on dialysis     "[employer]; T, Thurs, Sat" (12/26/2012)   Past Surgical History  Procedure Laterality Date  . Arteriovenous graft placement Right 12/2008    "upper  arm"   History reviewed. No pertinent family history.  Social History  He quit smoking one year ago after using about 1/2 pack a day since he was a teenager.  He denies any history of alcohol or illicit drug use.  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  No Known Allergies Prior to Admission medications   Medication Sig Start Date End Date Taking? Authorizing Provider  Calcium Acetate 667 MG TABS Take 1-3 tablets by mouth See admin instructions. 1 tablet with a small snack, 2 tablets with a small meal, and 3 tablets with a large meal.    Historical Provider, MD  omeprazole (PRILOSEC) 20 MG capsule Take 20 mg by mouth 2 (two) times daily.     Historical Provider, MD   Labs:  Results for orders placed during the hospital encounter of 12/26/12 (from the past 48 hour(s))  CBC     Status: Abnormal   Collection Time    12/26/12  9:31 AM      Result Value Range   WBC 5.3  4.0 - 10.5 K/uL   RBC 3.31 (*) 4.22 - 5.81 MIL/uL   Hemoglobin 10.4 (*) 13.0 - 17.0 g/dL   HCT 30.3 (*) 39.0 - 52.0 %   MCV 91.5  78.0 - 100.0 fL   MCH 31.4  26.0 - 34.0 pg   MCHC 34.3  30.0 - 36.0 g/dL   RDW 12.8  11.5 - 15.5 %   Platelets 216  150 - 400 K/uL  BASIC METABOLIC PANEL     Status: Abnormal   Collection Time    12/26/12  9:31 AM      Result Value Range   Sodium 143  135 - 145 mEq/L   Potassium 3.5  3.5 - 5.1 mEq/L   Chloride 98  96 - 112 mEq/L   CO2 36 (*) 19 - 32 mEq/L   Glucose, Bld 130 (*) 70 - 99 mg/dL   BUN 11  6 - 23 mg/dL   Creatinine, Ser 6.46 (*) 0.50 - 1.35 mg/dL   Calcium 9.7  8.4 - 10.5 mg/dL   GFR calc non Af Amer 7 (*) >90 mL/min   GFR calc Af Amer 8 (*) >90 mL/min   Comment: (NOTE)     The eGFR has been calculated using the CKD EPI equation.     This calculation has not been validated in all clinical situations.     eGFR's persistently <90 mL/min signify possible Chronic Kidney     Disease.  POCT I-STAT TROPONIN I     Status: None   Collection Time     12/26/12  9:44 AM      Result Value Range   Troponin i, poc 0.04  0.00 - 0.08 ng/mL   Comment 3            Comment: Due to the release kinetics of cTnI,     a negative result within the first hours     of the onset of symptoms does not rule out     myocardial infarction with certainty.     If myocardial infarction is still suspected,     repeat the test at appropriate intervals.  TROPONIN I     Status: None   Collection Time    12/26/12  7:00 PM      Result Value Range   Troponin I <0.30  <0.30 ng/mL   Comment:            Due to the release kinetics of cTnI,     a negative result within the first hours     of the onset of symptoms does not rule out     myocardial infarction with certainty.     If myocardial infarction is still suspected,     repeat the test at appropriate intervals.  PROTIME-INR     Status: None   Collection Time    12/26/12  7:00 PM      Result Value Range   Prothrombin Time 14.0  11.6 - 15.2 seconds   INR 1.10  0.00 - 1.49  TSH     Status: None   Collection Time    12/26/12  7:00 PM      Result Value Range   TSH 0.914  0.350 - 4.500 uIU/mL   Comment: Performed at [facility]     Status: None   Collection Time    12/26/12  7:00 PM      Result Value Range   Hemoglobin A1C 5.5  <5.7 %   Comment: (NOTE)  According to the ADA Clinical Practice Recommendations for 2011, when     HbA1c is used as a screening test:      >=6.5%   Diagnostic of Diabetes Mellitus               (if abnormal result is confirmed)     5.7-6.4%   Increased risk of developing Diabetes Mellitus     References:Diagnosis and Classification of Diabetes Mellitus,Diabetes     [id number] 1):S62-S69 and Standards of Medical Care in             Diabetes - 2011,Diabetes [id number] (Suppl 1):S11-S61.   Mean Plasma Glucose 111  <117 mg/dL   Comment: Performed at [facility]   TROPONIN I     Status: None   Collection Time    12/27/12  1:00 AM      Result Value Range   Troponin I <0.30  <0.30 ng/mL   Comment:            Due to the release kinetics of cTnI,     a negative result within the first hours     of the onset of symptoms does not rule out     myocardial infarction with certainty.     If myocardial infarction is still suspected,     repeat the test at appropriate intervals.  LIPID PANEL     Status: Abnormal   Collection Time    12/27/12  5:45 AM      Result Value Range   Cholesterol 170  0 - 200 mg/dL   Triglycerides 136  <150 mg/dL   HDL 36 (*) >39 mg/dL   Total CHOL/HDL Ratio 4.7     VLDL 27  0 - 40 mg/dL   LDL Cholesterol 107 (*) 0 - 99 mg/dL   Comment:            Total Cholesterol/HDL:CHD Risk     Coronary Heart Disease Risk Table                         Men   Women      1/2 Average Risk   3.4   3.3      Average Risk       5.0   4.4      2 X Average Risk   9.6   7.1      3 X Average Risk  23.4   11.0                Use the calculated Patient Ratio     above and the CHD Risk Table     to determine the patient's CHD Risk.                ATP III CLASSIFICATION (LDL):      <100     mg/dL   Optimal      100-129  mg/dL   Near or Above                        Optimal      130-159  mg/dL   Borderline      160-189  mg/dL   High      >190     mg/dL   Very High  TROPONIN I     Status: None   Collection Time    12/27/12  5:45 AM      Result Value Range  Troponin I <0.30  <0.30 ng/mL   Comment:            Due to the release kinetics of cTnI,     a negative result within the first hours     of the onset of symptoms does not rule out     myocardial infarction with certainty.     If myocardial infarction is still suspected,     repeat the test at appropriate intervals.   Constitutional: negative for chills, fatigue, fevers and sweats Ears, nose, mouth, throat, and face: negative for earaches, hoarseness, nasal congestion and sore  throat Respiratory: negative for cough, dyspnea on exertion, hemoptysis and sputum Cardiovascular: negative for chest pain, chest pressure/discomfort, dyspnea, orthopnea and palpitations Gastrointestinal: negative for abdominal pain, change in bowel habits, nausea and vomiting Genitourinary:negative, oliguric Musculoskeletal:negative for arthralgias, back pain, myalgias and neck pain Neurological: negative for dizziness, gait problems, headaches, speech problems and weakness  Physical Exam: Filed Vitals:   12/27/12 0606  BP: 158/61  Pulse: 67  Temp: 97.7 F (36.5 C)  Resp:      General appearance: alert, cooperative and no distress Head: Normocephalic, without obvious abnormality, atraumatic Neck: no adenopathy, no carotid bruit, no JVD and supple, symmetrical, trachea midline Resp: Mild crackles with scattered rales bilaterally Cardio: RRR with Gr I/VI systolic murmur, no rub GI: soft, non-tender; bowel sounds normal; no masses,  no organomegaly Extremities: extremities normal, atraumatic, no cyanosis or edema Neurologic: Grossly normal Dialysis Access: AVF @ [facility] with + bruit   Assessment/Plan: 1. Chest pain/dyspnea - intermittent, sitting or on exertion; CXR with no overt edema, troponins negative; Hx NSTEMI in 2011started BB & ASA per cardiology; cardiac cath today postponed until 12/22 sec to another episode. 2. ESRD - HD on TTS @ [place], but ran only 2:23 yesterday.  HD today for 3 hrs, tomorrow for 4 hrs. 3. Hypertension/volume - BP 166/57 on Metoprolol 25 mg bid; wt 96.6 with EDW of 94.5 kg.   4. Anemia - Hgb 10.4 on outpatient Epogen 1000 U; last T-sat 36%.   5. Metabolic bone disease - Ca 9.7, last P 4.8, last iPTH 360; Hectorol 7 mcg, Phoslo 2 with meals. 6. Nutrition - renal diet & vitamin 7. Hx SLE- inactive  LYLES,CHARLES 12/27/2012, 4:05 PM   Attending Nephrologist: Roney Jaffe, MD  I have seen and examined patient, discussed with PA and agree with assessment  and plan as outlined above. Kelly Splinter MD pager [phone]    cell [phone] 12/27/2012, 4:15 PM

## 2012-12-27 NOTE — Interval H&P Note (Signed)
History and Physical Interval Note:  12/27/2012 2:23 PM  [name] [name]  has presented today for surgery, with the diagnosis of cp  The various methods of treatment have been discussed with the patient and family. After consideration of risks, benefits and other options for treatment, the patient has consented to  Procedure(s): RIGHT HEART CATH (Right) as a surgical intervention .  The patient's history has been reviewed, patient examined, no change in status, stable for surgery.  I have reviewed the patient's chart and labs.  Questions were answered to the patient's satisfaction.   Cath Lab Visit (complete for each Cath Lab visit)  Clinical Evaluation Leading to the Procedure:   ACS: yes  Non-ACS:     Anginal Classification: CCS III  Anti-ischemic medical therapy: No Therapy  Non-Invasive Test Results: No non-invasive testing performed  Prior CABG: No previous CABG        Collier Salina [facility], [facility], [facility] 12/27/2012 2:23 PM

## 2012-12-27 NOTE — Progress Notes (Signed)
   SUBJECTIVE:  No further chest pain.  No SOB   PHYSICAL EXAM Filed Vitals:   12/26/12 1700 12/26/12 1750 12/26/12 2117 12/27/12 0606  BP: 169/56 163/59 150/49 158/61  Pulse: 62 64 66 67  Temp:  98.5 F (36.9 C) 97.7 F (36.5 C) 97.7 F (36.5 C)  TempSrc:  Oral Oral Axillary  Resp: 15 16    Height:    6\' 1"  (1.854 m)  Weight:    213 lb 15.4 oz (97.052 kg)  SpO2: 98% 99% 98% 98%   General:  No distress Lungs:  Clear Heart:  RRR Abdomen:  Positive bowel sounds, no rebound no guarding Extremities:  No edema  LABS: Lab Results  Component Value Date   TROPONINI <0.30 12/27/2012   Results for orders placed during the hospital encounter of 12/26/12 (from the past 24 hour(s))  TROPONIN I     Status: None   Collection Time    12/26/12  7:00 PM      Result Value Range   Troponin I <0.30  <0.30 ng/mL  PROTIME-INR     Status: None   Collection Time    12/26/12  7:00 PM      Result Value Range   Prothrombin Time 14.0  11.6 - 15.2 seconds   INR 1.10  0.00 - 1.49  TSH     Status: None   Collection Time    12/26/12  7:00 PM      Result Value Range   TSH 0.914  0.350 - 4.500 uIU/mL  HEMOGLOBIN A1C     Status: None   Collection Time    12/26/12  7:00 PM      Result Value Range   Hemoglobin A1C 5.5  <5.7 %   Mean Plasma Glucose 111  <117 mg/dL  TROPONIN I     Status: None   Collection Time    12/27/12  1:00 AM      Result Value Range   Troponin I <0.30  <0.30 ng/mL  LIPID PANEL     Status: Abnormal   Collection Time    12/27/12  5:45 AM      Result Value Range   Cholesterol 170  0 - 200 mg/dL   Triglycerides 136  <150 mg/dL   HDL 36 (*) >39 mg/dL   Total CHOL/HDL Ratio 4.7     VLDL 27  0 - 40 mg/dL   LDL Cholesterol 107 (*) 0 - 99 mg/dL  TROPONIN I     Status: None   Collection Time    12/27/12  5:45 AM      Result Value Range   Troponin I <0.30  <0.30 ng/mL   No intake or output data in the 24 hours ending 12/27/12 1106   ASSESSMENT AND PLAN:  CHEST PAIN:   Ruled out.  However, given the high pretest probability of obstructive CAD cath is indicated. The patient understands that risks included but are not limited to stroke (1 in 1000), death (1 in 37), kidney failure [usually temporary] (1 in 500), bleeding (1 in 200), allergic reaction [possibly serious] (1 in 200).  The patient understands and agrees to proceed.   ESRD:  Dialysis tomorrow.  We will make sure that renal has been notified.    HTN:  BP elevated.  Beta blocker started.  Continue current therapy.    [name] [facility] 12/27/2012 11:06 AM

## 2012-12-27 NOTE — Progress Notes (Signed)
Patient brought to the cardiac cath lab for diagnostic cardiac cath. He reports increasing SOB of one hour duration. Visibly dyspneic. On oxygen patient unable to lie flat for procedure. Weight up to 97 kg this am (dry weight 94.5 kg). I think patient will need more dialysis until he safely undergo cardiac cath. Patient reports he received a bag and a half of IV fluids but none ordered or charted other than KVO but IV fluids running at 90 cc/hr on arrival. Discussed with Dr. Melvia Heaps with nephrology. Will tentatively reschedule procedure for Monday if improved post dialysis. Patient in agreement.  Peter Martinique MD, [facility]  12/27/2012

## 2012-12-28 DIAGNOSIS — I5033 Acute on chronic diastolic (congestive) heart failure: Secondary | ICD-10-CM | POA: Diagnosis present

## 2012-12-28 LAB — RENAL FUNCTION PANEL
Albumin: 2.9 g/dL — ABNORMAL LOW (ref 3.5–5.2)
BUN: 13 mg/dL (ref 6–23)
Calcium: 8.8 mg/dL (ref 8.4–10.5)
Chloride: 99 mEq/L (ref 96–112)
Creatinine, Ser: 6.57 mg/dL — ABNORMAL HIGH (ref 0.50–1.35)
GFR calc non Af Amer: 7 mL/min — ABNORMAL LOW (ref >90)
Glucose, Bld: 80 mg/dL (ref 70–99)
Phosphorus: 3.2 mg/dL (ref 2.3–4.6)
Potassium: 3.6 mEq/L (ref 3.5–5.1)

## 2012-12-28 LAB — CBC
HCT: 26.6 % — ABNORMAL LOW (ref 39.0–52.0)
Hemoglobin: 8.8 g/dL — ABNORMAL LOW (ref 13.0–17.0)
MCH: 30.6 pg (ref 26.0–34.0)
MCHC: 33.1 g/dL (ref 30.0–36.0)
Platelets: 184 10*3/uL (ref 150–400)
RDW: 12.8 % (ref 11.5–15.5)
WBC: 6.1 10*3/uL (ref 4.0–10.5)

## 2012-12-28 MED ORDER — DOXERCALCIFEROL 4 MCG/2ML IV SOLN
INTRAVENOUS | Status: AC
  Administered 2012-12-28: 7 ug via INTRAVENOUS
  Filled 2012-12-28: qty 4

## 2012-12-28 MED ORDER — DARBEPOETIN ALFA-POLYSORBATE 200 MCG/0.4ML IJ SOLN: 200.0000 ug | INTRAMUSCULAR | Status: DC

## 2012-12-28 MED ORDER — DARBEPOETIN ALFA-POLYSORBATE 25 MCG/0.42ML IJ SOLN
INTRAMUSCULAR | Status: AC
  Administered 2012-12-28: 25 ug via INTRAVENOUS
  Filled 2012-12-28: qty 0.42

## 2012-12-28 NOTE — Progress Notes (Signed)
Subjective:  No complaints, no problems since attempted cardiac cath yesterday, tolerated dialysis well last night.  Objective: Vital signs in last 24 hours: Temp:  [97.6 F (36.4 C)-98.6 F (37 C)] 98.6 F (37 C) (12/20 0519) Pulse Rate:  [52-70] 70 (12/20 0519) Resp:  [18-23] 18 (12/20 0519) BP: (137-166)/(42-64) 144/52 mmHg (12/20 0519) SpO2:  [95 %-100 %] 95 % (12/20 0519) Weight:  [95 kg (209 lb 7 oz)-97.6 kg (215 lb 2.7 oz)] 96.435 kg (212 lb 9.6 oz) (12/20 0519) Weight change: 3.705 kg (8 lb 2.7 oz)  Intake/Output from previous day: 12/19 0701 - 12/20 0700 In: -  Out: 2700    Lab Results:  Recent Labs  12/27/12 1700 12/28/12 0110  WBC 6.3 6.1  HGB 9.4* 8.8*  HCT 28.5* 26.6*  PLT 204 184   BMET:  Recent Labs  12/27/12 1700 12/28/12 0110  NA 141 138  K 4.2 3.6  CL 98 99  CO2 30 31  GLUCOSE 95 80  BUN 23 13  CREATININE 10.03* 6.57*  CALCIUM 9.4 8.8  ALBUMIN 3.2* 2.9*   No results found for this basename: PTH,  in the last 72 hours Iron Studies: No results found for this basename: IRON, TIBC, TRANSFERRIN, FERRITIN,  in the last 72 hours  EXAM: General appearance:  Alert, in no apparent distress Resp:  CTA without rales, rhonchi, or wheezes Cardio:  RRR with Gr I/VI systolic murmur, no rub GI:  + BS, soft and nontender Extremities:  No edema Access:  AVF @ [facility] with + bruit  Dialysis Orders: TTS @ [place]  4 hrs 94.5 kg 400/800 2K/2.25Ca AVF @ [facility] Heparin 8000 U  Hectorol 7 mcg Epogen 1000 U Venofer 0  Assessment/Plan: 1. Chest pain/dyspnea - intermittent, sitting or on exertion, last episode yesterday with attempted cardiac cath which was postponed until 12/22; CXR with no overt edema, troponins negative; Hx NSTEMI in 2011, started BB & ASA per cardiology. Still over dry wt, HD again today, lower volume as BP tolerates 2. ESRD - HD on TTS @ [place], but ran only 2:23 on 12/18, tolerated HD (3 hr Tx) well yesterday.  HD today for 3.5 hrs to stay on  schedule. 3. Hypertension/volume - BP 144/52 on Metoprolol 25 mg bid; wt 96.4 s/p net UF 2.7 L yesterday.  4. Anemia - Hgb down to 8.8, on outpatient Epogen 1000 U; last T-sat 36%.  Aranesp 200 mcg today. 5. Metabolic bone disease - Ca 8.8 (9.7 corrected), P 3.2, last iPTH 360; Hectorol 7 mcg, Phoslo 2 with meals. 6. Nutrition - Alb 2.9, renal diet & vitamin.     LOS: 2 days   LYLES,CHARLES 12/28/2012,8:14 AM  I have seen and examined patient, discussed with PA and agree with assessment and plan as outlined above with additions as indicated. Kelly Splinter MD pager [phone]    cell [phone] 12/28/2012, 10:45 AM

## 2012-12-28 NOTE — Progress Notes (Signed)
Utilization Review completed.  

## 2012-12-28 NOTE — Progress Notes (Signed)
Patient ID: [name] [name], male   DOB: 1930/04/09, 79 y.o.   MRN: [id number] Subjective:  No chest pain. Dyspnea is improved. No edema. Tolerated HD well.  Objective:  Vital Signs in the last 24 hours: Temp:  [97.6 F (36.4 C)-98.6 F (37 C)] 98.6 F (37 C) (12/20 0519) Pulse Rate:  [52-70] 70 (12/20 0519) Resp:  [18-23] 18 (12/20 0519) BP: (137-166)/(42-64) 144/52 mmHg (12/20 0519) SpO2:  [95 %-100 %] 95 % (12/20 0519) Weight:  [209 lb 7 oz (95 kg)-215 lb 2.7 oz (97.6 kg)] 212 lb 9.6 oz (96.435 kg) (12/20 0519)  Intake/Output from previous day: 12/19 0701 - 12/20 0700 In: -  Out: 2700  Intake/Output from this shift:    Physical Exam: Well appearing elderly man, NAD HEENT: Unremarkable Neck:  No JVD, no thyromegally Back:  No CVA tenderness Lungs:  Clear except for minimal basilar rales. HEART:  Regular rate rhythm, no murmurs, no rubs, no clicks Abd:  Flat, positive bowel sounds, no organomegally, no rebound, no guarding Ext:  2 plus pulses, trace peripheral edema, no cyanosis, no clubbing Skin:  No rashes no nodules Neuro:  CN II through XII intact, motor grossly intact  Lab Results:  Recent Labs  12/27/12 1700 12/28/12 0110  WBC 6.3 6.1  HGB 9.4* 8.8*  PLT 204 184    Recent Labs  12/27/12 1700 12/28/12 0110  NA 141 138  K 4.2 3.6  CL 98 99  CO2 30 31  GLUCOSE 95 80  BUN 23 13  CREATININE 10.03* 6.57*    Recent Labs  12/27/12 0100 12/27/12 0545  TROPONINI <0.30 <0.30   Hepatic Function Panel  Recent Labs  12/28/12 0110  ALBUMIN 2.9*    Recent Labs  12/27/12 0545  CHOL 170   No results found for this basename: PROTIME,  in the last 72 hours  Imaging: No results found.  Cardiac Studies: Tele - nsr Assessment/Plan:  1. Chest pressure - resolved. Await left heart cath on Monday. 2. Acute on chronic heart failure - continue HD under the direction of Dr. Melvia Heaps. He is down 8 lbs since yesterday. 3. ESRD on HD - note plans for additional  HD today.  LOS: 2 days    Gregg Taylor,M.D. 12/28/2012, 11:08 AM

## 2012-12-28 NOTE — Procedures (Signed)
I was present at this dialysis session, have reviewed the session itself and made  appropriate changes  Kelly Splinter MD (pgr) [phone]    [phone] 12/28/2012, 2:32 PM  '

## 2012-12-29 MED ORDER — CALCIUM ACETATE 667 MG PO CAPS
667.0000 mg | ORAL_CAPSULE | ORAL | Status: DC | PRN
  Filled 2012-12-29: qty 1

## 2012-12-29 MED ORDER — LISINOPRIL 10 MG PO TABS
10.0000 mg | ORAL_TABLET | Freq: Every day | ORAL | Status: DC
  Administered 2012-12-29: 10 mg via ORAL
  Filled 2012-12-29 (×2): qty 1

## 2012-12-29 NOTE — Progress Notes (Signed)
Subjective:  No complaints, had brief episode of dyspnea yesterday prior to dialysis, but not as bad as previous episodes.  Objective: Vital signs in last 24 hours: Temp:  [97.6 F (36.4 C)-98.7 F (37.1 C)] 98.7 F (37.1 C) (12/21 0500) Pulse Rate:  [62-82] 69 (12/21 0500) Resp:  [14-28] 18 (12/21 0500) BP: (124-188)/(41-116) 145/42 mmHg (12/21 0500) SpO2:  [97 %-99 %] 99 % (12/21 0500) Weight:  [92 kg (202 lb 13.2 oz)-95.4 kg (210 lb 5.1 oz)] 92.08 kg (203 lb) (12/21 0500) Weight change: -2.2 kg (-4 lb 13.6 oz)  Intake/Output from previous day: 12/20 0701 - 12/21 0700 In: 460 [P.O.:460] Out: 5800    Lab Results:  Recent Labs  12/27/12 1700 12/28/12 0110  WBC 6.3 6.1  HGB 9.4* 8.8*  HCT 28.5* 26.6*  PLT 204 184   BMET:  Recent Labs  12/27/12 1700 12/28/12 0110  NA 141 138  K 4.2 3.6  CL 98 99  CO2 30 31  GLUCOSE 95 80  BUN 23 13  CREATININE 10.03* 6.57*  CALCIUM 9.4 8.8  ALBUMIN 3.2* 2.9*   No results found for this basename: PTH,  in the last 72 hours Iron Studies: No results found for this basename: IRON, TIBC, TRANSFERRIN, FERRITIN,  in the last 72 hours  EXAM:  General appearance: Alert, in no apparent distress  Resp: CTA without rales, rhonchi, or wheezes  Cardio: RRR with Gr I/VI systolic murmur, no rub  GI: + BS, soft and nontender  Extremities: No edema  Access: AVF @ [facility] with + bruit   Dialysis Orders: TTS @ [place]  4 hrs 94.5 kg 400/800 2K/2.25Ca AVF @ [facility] Heparin 8000 U  Hectorol 7 mcg Epogen 1000 U Venofer 0  Assessment/Plan: 1. Chest pain/dyspnea - intermittent, sitting or on exertion, episode on 12/19 with dialysis which lasted 15 min. Unable to cath due to SOB on day of admission. Has had HD x 2, dyspnea resolved. Needs cath, on schedule for tomorrow. CXR with no overt edema, troponins negative; Hx NSTEMI in 2011, started BB & ASA per cardiology; wt down to 92.1 kg s/p HD. 2. ESRD - HD on TTS @ [place], next HD tomorrow after cardiac  cath 3. Hypertension/volume - BP 145/42 on Metoprolol 25 mg bid; wt 92.1 s/p net UF 2.9 L yesterday. Lower EDW.  4. Anemia - Hgb down to 8.8, on outpatient Epogen 1000 U; last T-sat 36%, s/p Aranesp 200 mcg on Sat.  5. Metabolic bone disease - Ca 8.8 (9.7 corrected), P 3.2, last iPTH 360; Hectorol 7 mcg, Phoslo 2 with meals.  6. Nutrition - Alb 2.9, renal diet & vitamin.     LOS: 3 days   LYLES,CHARLES 12/29/2012,7:57 AM  I have seen and examined patient, discussed with PA and agree with assessment and plan as outlined above.  Stable , SOB resolved. For cath tomorrow Kelly Splinter MD pager [phone]    cell [phone] 12/29/2012, 1:18 PM

## 2012-12-29 NOTE — Progress Notes (Signed)
Patient ID: [name] [name], male   DOB: [birth year], 79 y.o.   MRN: [id number]    Primary cardiologist:  Subjective:   No complaints this morning.    Objective:   Temp:  [97.6 F (36.4 C)-98.7 F (37.1 C)] 98.7 F (37.1 C) (12/21 0500) Pulse Rate:  [61-82] 61 (12/21 0946) Resp:  [14-28] 18 (12/21 0500) BP: (124-188)/(41-116) 158/57 mmHg (12/21 0946) SpO2:  [97 %-99 %] 99 % (12/21 0500) Weight:  [202 lb 13.2 oz (92 kg)-210 lb 5.1 oz (95.4 kg)] 203 lb (92.08 kg) (12/21 0500) Last BM Date: 12/29/12  Filed Weights   12/28/12 1720 12/28/12 1900 12/29/12 0500  Weight: 202 lb 13.2 oz (92 kg) 204 lb 4.8 oz (92.67 kg) 203 lb (92.08 kg)    Intake/Output Summary (Last 24 hours) at 12/29/12 1035 Last data filed at 12/29/12 0600  Gross per 24 hour  Intake    240 ml  Output   5800 ml  Net  -5560 ml    Telemetry: no events  Exam:  General:NAD  Resp: CTAB  Cardiac: RRR, no m/r/g, no JVD  [id number], NT, ND  MSK: warm extremities, no edema  Neuro: no focal deficits   Lab Results:  Basic Metabolic Panel:  Recent Labs Lab 12/26/12 0931 12/27/12 1700 12/28/12 0110  NA 143 141 138  K 3.5 4.2 3.6  CL 98 98 99  CO2 36* 30 31  GLUCOSE 130* 95 80  BUN 11 23 13   CREATININE 6.46* 10.03* 6.57*  CALCIUM 9.7 9.4 8.8    Liver Function Tests:  Recent Labs Lab 12/27/12 1700 12/28/12 0110  ALBUMIN 3.2* 2.9*    CBC:  Recent Labs Lab 12/26/12 0931 12/27/12 1700 12/28/12 0110  WBC 5.3 6.3 6.1  HGB 10.4* 9.4* 8.8*  HCT 30.3* 28.5* 26.6*  MCV 91.5 93.8 92.4  PLT 216 204 184    Cardiac Enzymes:  Recent Labs Lab 12/26/12 1900 12/27/12 0100 12/27/12 0545  TROPONINI <0.30 <0.30 <0.30    BNP: No results found for this basename: PROBNP,  in the last 8760 hours  Coagulation:  Recent Labs Lab 12/26/12 1900  INR 1.10    ECG:   Medications:   Scheduled Medications: . aspirin  81 mg Oral Daily  . calcium acetate  1,334-2,001 mg Oral TID WC  . darbepoetin  (ARANESP) injection - DIALYSIS  25 mcg Intravenous Q Sat-HD  . doxercalciferol  7 mcg Intravenous Q T,Th,Sa-HD  . enoxaparin (LOVENOX) injection  30 mg Subcutaneous Q24H  . metoprolol tartrate  25 mg Oral BID  . multivitamin  1 tablet Oral QHS  . pantoprazole  40 mg Oral Daily  . sodium chloride  3 mL Intravenous Q12H     Infusions:     PRN Medications:  sodium chloride, acetaminophen, calcium acetate, nitroGLYCERIN, ondansetron (ZOFRAN) IV, sodium chloride, zolpidem     Assessment/Plan    1. Chest pain - patient could not lay flat for cath on Friday due to volume overload, volume status optimized over the weekend with dialysis with planned cath for Monday.  - denies any recent chest pain - continue current medications - plan for diagnostic cath on Monday  2. Acute on chronic heart failure - continue HD under the direction of Dr. Melvia Heaps. - symptoms have improved with fluid removal   3. HTN - in setting of known cardiovascular disease will start lisinopril 10 mg daily       Carlyle Dolly, M.D., F.A.C.C.

## 2012-12-30 ENCOUNTER — Encounter (HOSPITAL_COMMUNITY)
Admission: EM | Disposition: A | Discharge status: Home / Self Care | Payer: Self-pay | Source: Home / Self Care | Attending: Cardiothoracic Surgery

## 2012-12-30 ENCOUNTER — Other Ambulatory Visit: Payer: Self-pay | Admitting: *Deleted

## 2012-12-30 ENCOUNTER — Encounter (HOSPITAL_COMMUNITY): Payer: Medicare Other

## 2012-12-30 ENCOUNTER — Inpatient Hospital Stay (HOSPITAL_COMMUNITY): Payer: Medicare Other

## 2012-12-30 DIAGNOSIS — I2510 Atherosclerotic heart disease of native coronary artery without angina pectoris: Secondary | ICD-10-CM

## 2012-12-30 DIAGNOSIS — N186 End stage renal disease: Secondary | ICD-10-CM | POA: Diagnosis present

## 2012-12-30 DIAGNOSIS — I25810 Atherosclerosis of coronary artery bypass graft(s) without angina pectoris: Secondary | ICD-10-CM | POA: Diagnosis present

## 2012-12-30 DIAGNOSIS — N2581 Secondary hyperparathyroidism of renal origin: Secondary | ICD-10-CM | POA: Diagnosis present

## 2012-12-30 DIAGNOSIS — Z01810 Encounter for preprocedural cardiovascular examination: Secondary | ICD-10-CM

## 2012-12-30 HISTORY — PX: LEFT HEART CATHETERIZATION WITH CORONARY ANGIOGRAM: SHX5451

## 2012-12-30 HISTORY — PX: CARDIAC CATHETERIZATION: SHX172

## 2012-12-30 LAB — PULMONARY FUNCTION TEST
FEF 25-75 Post: 3.35 L/sec
FEF 25-75 Pre: 1.8 L/sec
FEF2575-%Change-Post: 86 %
FEF2575-%Pred-Post: 144 %
FEF2575-%Pred-Pre: 77 %
FEV1-%Change-Post: 14 %
FEV1-%Pred-Post: 91 %
FEV1-%Pred-Pre: 80 %
FEV1-Post: 2.77 L
FEV1-Pre: 2.41 L
FEV1FVC-%Change-Post: 0 %
FEV1FVC-%Pred-Pre: 101 %
FEV6-%Change-Post: 15 %
FEV6-%Pred-Post: 93 %
FEV6-%Pred-Pre: 81 %
FEV6-Post: 3.64 L
FEV6-Pre: 3.16 L
FEV6FVC-%Change-Post: 0 %
FEV6FVC-%Pred-Post: 103 %
FEV6FVC-%Pred-Pre: 103 %
FVC-%Change-Post: 15 %
FVC-%Pred-Post: 90 %
FVC-%Pred-Pre: 78 %
FVC-Post: 3.7 L
FVC-Pre: 3.21 L
Post FEV1/FVC ratio: 75 %
Post FEV6/FVC ratio: 98 %
Pre FEV1/FVC ratio: 75 %
Pre FEV6/FVC Ratio: 98 %

## 2012-12-30 LAB — BLOOD GAS, ARTERIAL
Acid-Base Excess: 5.5 mmol/L — ABNORMAL HIGH (ref 0.0–2.0)
Bicarbonate: 29 mEq/L — ABNORMAL HIGH (ref 20.0–24.0)
Drawn by: 331761
FIO2: 0.21 %
O2 Saturation: 98 %
Patient temperature: 98.6
TCO2: 30.2 mmol/L (ref 0–100)
pCO2 arterial: 39.1 mmHg (ref 35.0–45.0)
pH, Arterial: 7.484 — ABNORMAL HIGH (ref 7.350–7.450)
pO2, Arterial: 84.4 mmHg (ref 80.0–100.0)

## 2012-12-30 LAB — CBC
HCT: 27.9 % — ABNORMAL LOW (ref 39.0–52.0)
Hemoglobin: 9.1 g/dL — ABNORMAL LOW (ref 13.0–17.0)
MCH: 30.2 pg (ref 26.0–34.0)
MCHC: 32.6 g/dL (ref 30.0–36.0)
RBC: 3.01 MIL/uL — ABNORMAL LOW (ref 4.22–5.81)
RDW: 12.9 % (ref 11.5–15.5)

## 2012-12-30 LAB — RENAL FUNCTION PANEL
Albumin: 3 g/dL — ABNORMAL LOW (ref 3.5–5.2)
BUN: 30 mg/dL — ABNORMAL HIGH (ref 6–23)
Calcium: 10.2 mg/dL (ref 8.4–10.5)
Glucose, Bld: 114 mg/dL — ABNORMAL HIGH (ref 70–99)
Phosphorus: 3.7 mg/dL (ref 2.3–4.6)
Sodium: 134 mEq/L — ABNORMAL LOW (ref 135–145)

## 2012-12-30 LAB — PROTIME-INR
INR: 1.12 (ref 0.00–1.49)
Prothrombin Time: 14.2 seconds (ref 11.6–15.2)

## 2012-12-30 LAB — APTT: aPTT: 33 seconds (ref 24–37)

## 2012-12-30 LAB — ABO/RH: ABO/RH(D): O POS

## 2012-12-30 LAB — PREPARE RBC (CROSSMATCH)

## 2012-12-30 SURGERY — LEFT HEART CATHETERIZATION WITH CORONARY ANGIOGRAM
Anesthesia: LOCAL

## 2012-12-30 SURGERY — RIGHT HEART CATH
Laterality: Right

## 2012-12-30 MED ORDER — HEPARIN (PORCINE) IN NACL 2-0.9 UNIT/ML-% IJ SOLN
INTRAMUSCULAR | Status: AC
  Filled 2012-12-30: qty 1000

## 2012-12-30 MED ORDER — NITROGLYCERIN 0.2 MG/ML ON CALL CATH LAB
INTRAVENOUS | Status: AC
  Filled 2012-12-30: qty 1

## 2012-12-30 MED ORDER — DEXTROSE 5 % IV SOLN
1.5000 g | INTRAVENOUS | Status: AC
  Administered 2012-12-31 (×2): 1.5 g via INTRAVENOUS
  Filled 2012-12-30: qty 1.5

## 2012-12-30 MED ORDER — SODIUM CHLORIDE 0.9 % IV SOLN: 100.0000 mL | INTRAVENOUS | Status: DC | PRN

## 2012-12-30 MED ORDER — SODIUM CHLORIDE 0.9 % IV SOLN
INTRAVENOUS | Status: AC
  Administered 2012-12-31: 69.7 mL/h via INTRAVENOUS
  Filled 2012-12-30: qty 40

## 2012-12-30 MED ORDER — LIDOCAINE HCL (PF) 1 % IJ SOLN: 5.0000 mL | INTRAMUSCULAR | Status: DC | PRN

## 2012-12-30 MED ORDER — HEPARIN SODIUM (PORCINE) 1000 UNIT/ML IJ SOLN
INTRAMUSCULAR | Status: DC
  Filled 2012-12-30: qty 30

## 2012-12-30 MED ORDER — LIDOCAINE HCL (PF) 1 % IJ SOLN
INTRAMUSCULAR | Status: AC
  Filled 2012-12-30: qty 30

## 2012-12-30 MED ORDER — DOXERCALCIFEROL 4 MCG/2ML IV SOLN
INTRAVENOUS | Status: AC
  Filled 2012-12-30: qty 4

## 2012-12-30 MED ORDER — CEFUROXIME SODIUM 750 MG IJ SOLR
750.0000 mg | INTRAMUSCULAR | Status: DC
  Filled 2012-12-30: qty 750

## 2012-12-30 MED ORDER — FENTANYL CITRATE 0.05 MG/ML IJ SOLN
INTRAMUSCULAR | Status: AC
  Filled 2012-12-30: qty 2

## 2012-12-30 MED ORDER — PLASMA-LYTE 148 IV SOLN
INTRAVENOUS | Status: AC
  Administered 2012-12-31: 09:00:00
  Filled 2012-12-30: qty 2.5

## 2012-12-30 MED ORDER — DARBEPOETIN ALFA-POLYSORBATE 150 MCG/0.3ML IJ SOLN: 150.0000 ug | INTRAMUSCULAR | Status: DC

## 2012-12-30 MED ORDER — EPINEPHRINE HCL 1 MG/ML IJ SOLN
0.5000 ug/min | INTRAMUSCULAR | Status: DC
  Filled 2012-12-30: qty 4

## 2012-12-30 MED ORDER — NITROGLYCERIN IN D5W 200-5 MCG/ML-% IV SOLN
2.0000 ug/min | INTRAVENOUS | Status: AC
  Administered 2012-12-31: 5 ug/min via INTRAVENOUS
  Filled 2012-12-30: qty 250

## 2012-12-30 MED ORDER — NEPRO/CARBSTEADY PO LIQD
237.0000 mL | ORAL | Status: DC | PRN
  Filled 2012-12-30: qty 237

## 2012-12-30 MED ORDER — LIDOCAINE-PRILOCAINE 2.5-2.5 % EX CREA: 1.0000 "application " | TOPICAL_CREAM | CUTANEOUS | Status: DC | PRN

## 2012-12-30 MED ORDER — ALTEPLASE 2 MG IJ SOLR
2.0000 mg | Freq: Once | INTRAMUSCULAR | Status: DC | PRN
  Filled 2012-12-30: qty 2

## 2012-12-30 MED ORDER — ALBUTEROL SULFATE (5 MG/ML) 0.5% IN NEBU
2.5000 mg | INHALATION_SOLUTION | Freq: Once | RESPIRATORY_TRACT | Status: AC
  Administered 2012-12-30: 2.5 mg via RESPIRATORY_TRACT

## 2012-12-30 MED ORDER — TEMAZEPAM 15 MG PO CAPS: 15.0000 mg | ORAL_CAPSULE | Freq: Once | ORAL | Status: DC | PRN

## 2012-12-30 MED ORDER — MIDAZOLAM HCL 2 MG/2ML IJ SOLN
INTRAMUSCULAR | Status: AC
  Filled 2012-12-30: qty 2

## 2012-12-30 MED ORDER — PENTAFLUOROPROP-TETRAFLUOROETH EX AERO: 1.0000 "application " | INHALATION_SPRAY | CUTANEOUS | Status: DC | PRN

## 2012-12-30 MED ORDER — HEPARIN SODIUM (PORCINE) 1000 UNIT/ML DIALYSIS: 1000.0000 [IU] | INTRAMUSCULAR | Status: DC | PRN

## 2012-12-30 MED ORDER — CHLORHEXIDINE GLUCONATE 4 % EX LIQD
CUTANEOUS | Status: AC
  Filled 2012-12-30: qty 120

## 2012-12-30 MED ORDER — DOPAMINE-DEXTROSE 3.2-5 MG/ML-% IV SOLN
2.0000 ug/kg/min | INTRAVENOUS | Status: AC
  Administered 2012-12-31: 3 ug/kg/min via INTRAVENOUS
  Filled 2012-12-30: qty 250

## 2012-12-30 MED ORDER — VANCOMYCIN HCL 10 G IV SOLR
1500.0000 mg | INTRAVENOUS | Status: AC
  Administered 2012-12-31: 1500 mg via INTRAVENOUS
  Filled 2012-12-30: qty 1500

## 2012-12-30 MED ORDER — LISINOPRIL 10 MG PO TABS
10.0000 mg | ORAL_TABLET | Freq: Every day | ORAL | Status: DC
  Filled 2012-12-30: qty 1

## 2012-12-30 MED ORDER — DEXMEDETOMIDINE HCL IN NACL 400 MCG/100ML IV SOLN
0.1000 ug/kg/h | INTRAVENOUS | Status: AC
  Administered 2012-12-31: .2 ug/kg/h via INTRAVENOUS
  Filled 2012-12-30: qty 100

## 2012-12-30 MED ORDER — PHENYLEPHRINE HCL 10 MG/ML IJ SOLN
30.0000 ug/min | INTRAVENOUS | Status: DC
  Filled 2012-12-30: qty 2

## 2012-12-30 MED ORDER — METOPROLOL TARTRATE 12.5 MG HALF TABLET
12.5000 mg | ORAL_TABLET | Freq: Once | ORAL | Status: AC
  Administered 2012-12-31: 12.5 mg via ORAL
  Filled 2012-12-30: qty 1

## 2012-12-30 MED ORDER — MAGNESIUM SULFATE 50 % IJ SOLN
40.0000 meq | INTRAMUSCULAR | Status: DC
  Filled 2012-12-30: qty 10

## 2012-12-30 MED ORDER — BISACODYL 5 MG PO TBEC
5.0000 mg | DELAYED_RELEASE_TABLET | Freq: Once | ORAL | Status: DC
  Filled 2012-12-30: qty 1

## 2012-12-30 MED ORDER — POTASSIUM CHLORIDE 2 MEQ/ML IV SOLN
80.0000 meq | INTRAVENOUS | Status: DC
  Filled 2012-12-30: qty 40

## 2012-12-30 MED ORDER — SODIUM CHLORIDE 0.9 % IV SOLN
INTRAVENOUS | Status: AC
  Administered 2012-12-31: 1.3 [IU]/h via INTRAVENOUS
  Filled 2012-12-30: qty 1

## 2012-12-30 NOTE — Procedures (Signed)
I was present at this session.  I have reviewed the session itself and made appropriate changes.  Access press ok. bp 130s.   Jadian Karman L 12/22/20141:28 PM

## 2012-12-30 NOTE — H&P (View-Only) (Signed)
Patient ID: Reginal Advani, male   DOB: December 01, 1930, 79 y.o.   MRN: [id number]    Primary cardiologist:  Subjective:   No complaints this morning.    Objective:   Temp:  [97.6 F (36.4 C)-98.7 F (37.1 C)] 98.7 F (37.1 C) (12/21 0500) Pulse Rate:  [61-82] 61 (12/21 0946) Resp:  [14-28] 18 (12/21 0500) BP: (124-188)/(41-116) 158/57 mmHg (12/21 0946) SpO2:  [97 %-99 %] 99 % (12/21 0500) Weight:  [202 lb 13.2 oz (92 kg)-210 lb 5.1 oz (95.4 kg)] 203 lb (92.08 kg) (12/21 0500) Last BM Date: 12/29/12  Filed Weights   12/28/12 1720 12/28/12 1900 12/29/12 0500  Weight: 202 lb 13.2 oz (92 kg) 204 lb 4.8 oz (92.67 kg) 203 lb (92.08 kg)    Intake/Output Summary (Last 24 hours) at 12/29/12 1035 Last data filed at 12/29/12 0600  Gross per 24 hour  Intake    240 ml  Output   5800 ml  Net  -5560 ml    Telemetry: no events  Exam:  General:NAD  Resp: CTAB  Cardiac: RRR, no m/r/g, no JVD  [id number], NT, ND  MSK: warm extremities, no edema  Neuro: no focal deficits   Lab Results:  Basic Metabolic Panel:  Recent Labs Lab 12/26/12 0931 12/27/12 1700 12/28/12 0110  NA 143 141 138  K 3.5 4.2 3.6  CL 98 98 99  CO2 36* 30 31  GLUCOSE 130* 95 80  BUN 11 23 13   CREATININE 6.46* 10.03* 6.57*  CALCIUM 9.7 9.4 8.8    Liver Function Tests:  Recent Labs Lab 12/27/12 1700 12/28/12 0110  ALBUMIN 3.2* 2.9*    CBC:  Recent Labs Lab 12/26/12 0931 12/27/12 1700 12/28/12 0110  WBC 5.3 6.3 6.1  HGB 10.4* 9.4* 8.8*  HCT 30.3* 28.5* 26.6*  MCV 91.5 93.8 92.4  PLT 216 204 184    Cardiac Enzymes:  Recent Labs Lab 12/26/12 1900 12/27/12 0100 12/27/12 0545  TROPONINI <0.30 <0.30 <0.30    BNP: No results found for this basename: PROBNP,  in the last 8760 hours  Coagulation:  Recent Labs Lab 12/26/12 1900  INR 1.10    ECG:   Medications:   Scheduled Medications: . aspirin  81 mg Oral Daily  . calcium acetate  1,334-2,001 mg Oral TID WC  . darbepoetin  (ARANESP) injection - DIALYSIS  25 mcg Intravenous Q Sat-HD  . doxercalciferol  7 mcg Intravenous Q T,Th,Sa-HD  . enoxaparin (LOVENOX) injection  30 mg Subcutaneous Q24H  . metoprolol tartrate  25 mg Oral BID  . multivitamin  1 tablet Oral QHS  . pantoprazole  40 mg Oral Daily  . sodium chloride  3 mL Intravenous Q12H     Infusions:     PRN Medications:  sodium chloride, acetaminophen, calcium acetate, nitroGLYCERIN, ondansetron (ZOFRAN) IV, sodium chloride, zolpidem     Assessment/Plan    1. Chest pain - patient could not lay flat for cath on Friday due to volume overload, volume status optimized over the weekend with dialysis with planned cath for Monday.  - denies any recent chest pain - continue current medications - plan for diagnostic cath on Monday  2. Acute on chronic heart failure - continue HD under the direction of Dr. Melvia Heaps. - symptoms have improved with fluid removal   3. HTN - in setting of known cardiovascular disease will start lisinopril 10 mg daily       Carlyle Dolly, M.D., F.A.C.C.

## 2012-12-30 NOTE — Care Management Note (Unsigned)
    Page 1 of 1   12/30/2012     12:03:23 PM   CARE MANAGEMENT NOTE 12/30/2012  Patient:  [name] [name],[name] [name]   Account Number:  [id number]  Date Initiated:  12/30/2012  Documentation initiated by:  GRAVES-BIGELOW,Oliver Neuwirth  Subjective/Objective Assessment:   Pt admitted for cp unable to cath on 12-27-12 due to SOB. Cath done 12-30-12 and revealed 3 vessel CAD. Plan for CVTS to consult.     Action/Plan:   CM will continue to monitor for disposition needs.   Anticipated DC Date:  01/01/2013   Anticipated DC Plan:  [place]  CM consult      Choice offered to / List presented to:             Status of service:  In process, will continue to follow Medicare Important Message given?   (If response is "NO", the following Medicare IM given date fields will be blank) Date Medicare IM given:   Date Additional Medicare IM given:    Discharge Disposition:    Per UR Regulation:  Reviewed for med. necessity/level of care/duration of stay  If discussed at [place] of Stay Meetings, dates discussed:   12/31/2012    Comments:

## 2012-12-30 NOTE — Consult Note (Signed)
[address]       [place] [address]             [phone]        Aksel Aune [facility] Medical Record [id number] Date of Birth: 07/14/1930  Referring: Dr Angelena Form Primary Cardiology: Dr  Percival Spanish Primary Care: [name], MD  Chief Complaint:    Chief Complaint  Patient presents with  . Chest Pain    History of Present Illness:     [name] [name] is an 79 y.o. male with a history of HTN, HLD, obesity, anemia, CKD on dialysis and NSTEMI (2011; Lexiscan myovewi with inferior scar, normal LVEF). He was seen in AUG 2014  for evaluation of hypotension. He had seen him in the past following a non-Q wave myocardial infarction. In 2011 he had a low risk perfusion study with a possible old inferior infarct.  Symptoms resolved until last Thursday when he was admitted. He was  being evaluated today for chest pain associated with SOB and diaphoresis during dialysis last week. The dialysis center called EMS; He reports chest pain, SOB, diaphoresis, and vomiting off and on over the past 2 months. Dr. Arelia Sneddon, his PCP, gave him a "brown capsule" (? omeprazole) which did not help. At dialysis 12/18  he experienced chest pain, shortness of breath and diaphoresis which lasted about 15 minutes and then slowly abated, although he did not vomit today.  He denies radiation, palpations, dizziness, lightheadedness, presyncope or abdominal pain. It is not worse with any movement or with inspiration. He reports that is reminiscent of his previous cardiac chest pain in 2011. Patient is somewhat active; he rides and cuts the grass with more SOB.   Cardiac catheterization was attempted on 12/19, but was canceled after the patient got to the cath lab because he was unable to be flat secondary to shortness of breath. He went back to the cath lab today for cardiac catheterization. And is now been referred for consideration of coronary artery bypass grafting.  Lab Results  Component  Value Date   CKTOTAL 632* 01/30/2009   CKMB 4.9* 01/30/2009   TROPONINI <0.30 12/27/2012      Current Activity/ Functional Status: Patient is independent with mobility/ambulation, transfers, ADL's, IADL's.   Zubrod Score: At the time of surgery this patient's most appropriate activity status/level should be described as: []  Normal activity, no symptoms [x]  Symptoms, fully ambulatory []  Symptoms, in bed less than or equal to 50% of the time []  Symptoms, in bed greater than 50% of the time but less than 100% []  Bedridden []  Moribund  Past Medical History  Diagnosis Date  . Hypertension   . Gout   . Dyslipidemia   . Obesity   . Volume overload   . MYOCARDIAL INFARCTION, ACUTE, NON-Q WAVE 04/26/2009    Qualifier: Diagnosis of  By: Percival Spanish, MD, Farrel Gordon    . Chest pain   . Anemia of chronic disease   . Shortness of breath     "comes up at anytime" (12/26/2012)  . Type II diabetes mellitus     "diet controlled" (12/26/2012)  . ESRD (end stage renal disease) on dialysis     "[employer]; T, Thurs, Sat" (12/26/2012)    Past Surgical History  Procedure Laterality Date  . Arteriovenous graft placement Right 12/2008    "upper arm"    History  Smoking status  . Former Smoker -- 1.00 packs/day for 40 years  . Types: Cigarettes  .  Quit date: 06/16/1995  Smokeless tobacco  . Never Used    History  Alcohol Use  . Yes    Comment: 12/26/2012 "none in 30 yr; drank some when I was young"    History   Social History  . Marital Status: Married    Spouse Name: N/A    Number of Children: 2  . Years of Education: N/A   Occupational History  . retired     Administrator   Social History Main Topics  . Smoking status: Former Smoker -- 1.00 packs/day for 40 years    Types: Cigarettes    Quit date: 06/16/1995  . Smokeless tobacco: Never Used  . Alcohol Use: Yes     Comment: 12/26/2012 "none in 30 yr; drank some when I was young"  . Drug Use: No  . Sexual Activity: Not  Currently   Social History Narrative   He is a retired Administrator. He lives in [place] with his wife. He has two sons    No Known Allergies  Current Facility-Administered Medications  Medication Dose Route Frequency Provider Last Rate Last Dose  . 0.9 %  sodium chloride infusion  250 mL Intravenous PRN Perry Mount, PA-C      . 0.9 %  sodium chloride infusion  100 mL Intravenous PRN Ramiro Harvest, PA-C      . 0.9 %  sodium chloride infusion  100 mL Intravenous PRN Ramiro Harvest, PA-C      . acetaminophen (TYLENOL) tablet 650 mg  650 mg Oral Q4H PRN Perry Mount, PA-C      . alteplase (CATHFLO ACTIVASE) injection 2 mg  2 mg Intracatheter Once PRN Ramiro Harvest, PA-C      . aspirin chewable tablet 81 mg  81 mg Oral Daily Perry Mount, PA-C   81 mg at 12/30/12 [id number]  . calcium acetate (PHOSLO) capsule 1,334-2,001 mg  1,334-2,001 mg Oral TID WC Fay Records, MD   2,001 mg at 12/30/12 1235   And  . calcium acetate (PHOSLO) capsule 667 mg  667 mg Oral PRN Fay Records, MD      . Derrill Memo ON 01/04/2013] darbepoetin (ARANESP) injection 150 mcg  150 mcg Intravenous Q Sat-HD Placido Sou, MD      . doxercalciferol (HECTOROL) injection 7 mcg  7 mcg Intravenous Q T,Th,Sa-HD Ramiro Harvest, PA-C   7 mcg at 12/28/12 1729  . feeding supplement (NEPRO CARB STEADY) liquid 237 mL  237 mL Oral PRN Ramiro Harvest, PA-C      . heparin injection 1,000 Units  1,000 Units Dialysis PRN Ramiro Harvest, PA-C      . lidocaine (PF) (XYLOCAINE) 1 % injection 5 mL  5 mL Intradermal PRN Ramiro Harvest, PA-C      . lidocaine-prilocaine (EMLA) cream 1 application  1 application Topical PRN Ramiro Harvest, PA-C      . Derrill Memo ON 12/31/2012] lisinopril (PRINIVIL,ZESTRIL) tablet 10 mg  10 mg Oral QHS Placido Sou, MD      . metoprolol tartrate (LOPRESSOR) tablet 25 mg  25 mg Oral BID Perry Mount, PA-C   25 mg at 12/29/12 2129  . multivitamin (RENA-VIT) tablet 1 tablet  1 tablet Oral QHS Ramiro Harvest, PA-C   1  tablet at 12/29/12 2128  . nitroGLYCERIN (NITROSTAT) SL tablet 0.4 mg  0.4 mg Sublingual Q5 Min x 3 PRN Perry Mount, PA-C      . ondansetron [facility]) injection 4 mg  4 mg Intravenous Q6H PRN Perry Mount,  PA-C      . pantoprazole (PROTONIX) EC tablet 40 mg  40 mg Oral Daily Perry Mount, PA-C   40 mg at 12/30/12 1149  . pentafluoroprop-tetrafluoroeth (GEBAUERS) aerosol 1 application  1 application Topical PRN Ramiro Harvest, PA-C      . sodium chloride 0.9 % injection 3 mL  3 mL Intravenous Q12H Perry Mount, PA-C   3 mL at 12/30/12 1149  . sodium chloride 0.9 % injection 3 mL  3 mL Intravenous PRN Perry Mount, PA-C      . zolpidem (AMBIEN) tablet 5 mg  5 mg Oral QHS PRN Perry Mount, PA-C        Prescriptions prior to admission  Medication Sig Dispense Refill  . Calcium Acetate 667 MG TABS Take 1-3 tablets by mouth See admin instructions. 1 tablet with a small snack, 2 tablets with a small meal, and 3 tablets with a large meal.      . omeprazole (PRILOSEC) 20 MG capsule Take 20 mg by mouth 2 (two) times daily.         History reviewed. No pertinent family history.   Review of Systems:     Cardiac Review of Systems: Y or N  Chest Pain [  y  ]  Resting SOB [ y  ] Exertional SOB  Blue.Reese  ]  Orthopnea [ y ]   Pedal Edema [   ]    Palpitations [n  ] Syncope  [ n ]   Presyncope [ n  ]  General Review of Systems: [Y] = yes [  ]=no Constitional: recent weight change [n ]; anorexia [ n ]; fatigue [ y ]; nausea [ n ]; night sweats [ n ]; fever [  ]; or chills [n  ]                                                               Dental: poor dentition[y  ]; Last Dentist visit:   Eye : blurred vision [  ]; diplopia [   ]; vision changes [  ];  Amaurosis fugax[  ]; Resp: cough [  n];  wheezing[n  ];  hemoptysis[  ]; shortness of breath[y  ]; paroxysmal nocturnal dyspnea[ y ]; dyspnea on exertion[ y ]; or orthopnea[y  ];  GI:  n], vomiting[  ];  dysphagia[  ]; melena[  ];  hematochezia [  ];  heartburn[  ];   Hx of  Colonoscopy[ twice  ]; GU: kidney stones [  ]; hematuria[  ];   dysuria [  ];  nocturia[  ];  history of     obstruction [  ]; urinary frequency [  ]             Skin: rash, swelling[  ];, hair loss[  ];  peripheral edema[  ];  or itching[  ]; Musculosketetal: myalgias[  ];  joint swelling[  ];  joint erythema[  ];  joint pain[ n ];  back pain[ n ];  Heme/Lymph: bruising[  ];  bleeding[  ];  anemia[  ];  Neuro: TIA[ n ];  headaches[ n ];  stroke[  ];  vertigo[  ];  seizures[ n ];   paresthesias[ n ];  difficulty walking[ n ];  Psych:depression[  ];  anxiety[  ];  Endocrine: diabetes[  ];  thyroid dysfunction[  ];  Immunizations: Flu Blue.Reese  ]; Pneumococcal[ y ];  Other:  Physical Exam: BP 120/58  Pulse 69  Temp(Src) 97.7 F (36.5 C) (Oral)  Resp 18  Ht 6\' 1"  (1.854 m)  Wt 205 lb 0.4 oz (93 kg)  BMI 27.06 kg/m2  SpO2 100%  General appearance: alert, cooperative, appears stated age and no distress Neurologic: intact Heart: regular rate and rhythm, S1, S2 normal, no murmur, click, rub or gallop Lungs: clear to auscultation bilaterally Abdomen: soft, non-tender; bowel sounds normal; no masses,  no organomegaly Extremities: extremities normal, atraumatic, no cyanosis or edema and Homans sign is negative, no sign of DVT Wound: Patient has patent fistula right upper arm for dialysis, right groin site is bandaged without evidence of hematoma following cardiac catheterization Patient has no carotid bruits his folds DP and PT pulses bilaterally, ears to have adequate vein for bypass in both lower extremities   Diagnostic Studies & Laboratory data:     Recent Radiology Findings:  Dg Chest 2 View  12/26/2012   CLINICAL DATA:  Shortness of breath, chest pain  EXAM: CHEST  2 VIEW  COMPARISON:  None.  FINDINGS: Bilateral diffuse interstitial thickening and peribronchial cuffing most concerning for bronchitis. There is no focal parenchymal opacity, pleural effusion, or  pneumothorax. The heart and mediastinal contours are unremarkable.  The osseous structures are unremarkable.  IMPRESSION: Bilateral diffuse interstitial thickening and peribronchial cuffing most concerning for bronchitis.   Electronically Signed   By: Kathreen Devoid   On: 12/26/2012 10:00    Recent Lab Findings: Lab Results  Component Value Date   WBC 6.3 12/30/2012   HGB 9.1* 12/30/2012   HCT 27.9* 12/30/2012   PLT 186 12/30/2012   GLUCOSE 114* 12/30/2012   CHOL 170 12/27/2012   TRIG 136 12/27/2012   HDL 36* 12/27/2012   LDLCALC 107* 12/27/2012   ALT 23 01/26/2009   NA 134* 12/30/2012   K 3.9 12/30/2012   CL 95* 12/30/2012   CREATININE 8.78* 12/30/2012   BUN 30* 12/30/2012   CO2 30 12/30/2012   TSH 0.914 12/26/2012   INR 1.10 12/26/2012   HGBA1C 5.5 12/26/2012   CATH: Hemodynamic Findings:  Central aortic pressure: 148/51  Left ventricular pressure: 145/7/14  Angiographic Findings:  Left main: Normal caliber. Mild plaque.  Left Anterior Descending Artery: Large caliber vessel that courses to the apex. The proximal vessel has diffuse 40% stenosis. The mid vessel has diffuse 80% stenosis. The distal vessel has diffuse 50% stenosis. The first diagonal branch is moderate in caliber with 90% ostial stenosis, proximal 90% stenosis. The second diagonal branch is moderate in caliber with mild plaque disease.  Circumflex Artery: Large caliber vessel with termination into a large obtuse marginal branch. There is a 99% mid stenosis leading into the obtuse marginal branch. The proximal segment of the obtuse marginal branch has a 70% stenosis.  Right Coronary Artery: Large dominant vessel with 70% proximal stenosis, 100% mid stenosis. The distal vessel is seen to fill from left to right collaterals.  Left Ventricular Angiogram: LVEF=45% with inferior wall hypokinesis.  Impression:  1. Severe triple vessel CAD  2. Mild segmental LV systolic dysfunction  3. Unstable angina  Recommendations:  Functional 79 yo male with severe multi-vessel CAD. Will ask CT surgery to see for possible CABG. If he is not felt to be a candidate for CABG, would require multi-vessel stenting.  Complications: None. The patient tolerated  the procedure well.     Assessment / Plan:   #1 symptomatic 3 vessel coronary artery disease with evidence of non-STEMI myocardial infarction with elevated CK MB and troponin #2 moderate LV dysfunction with ejection fraction 45% and inferior wall hypokinesis #3 stage IV renal failure/ on dialysis #4 dyslipidemia #5 secondary hyperparathyroidism #6 anemia-related to renal failure  With the patient's progressive symptoms over the past 4 months culminating in a non-STEMI myocardial infarction with three-vessel coronary artery disease and depressed LV function, his best option is to proceed with coronary artery bypass grafting. Because of his age and stage IV chronic renal disease he is at increased risk of surgery but remains active. Risks and options are discussed with the patient in detail.  He is willing to proceed. The goals risks and alternatives of the planned surgical procedure CABG have been discussed with the patient in detail. The risks of the procedure including death, infection, stroke, myocardial infarction, bleeding, blood transfusion have all been discussed specifically.  I have quoted [name] [name] a 8 % of perioperative mortality and a complication rate as high as 40 %. The patient's questions have been answered.[name] [name] is willing  to proceed with the planned procedure.   Grace Isaac MD      [address] [place] [address] Office [phone]   Beeper [phone]  12/30/2012 2:10 PM

## 2012-12-30 NOTE — Progress Notes (Signed)
Subjective: Interval History: none.  Objective: Vital signs in last 24 hours: Temp:  [97.5 F (36.4 C)-98.2 F (36.8 C)] 98.2 F (36.8 C) (12/22 0500) Pulse Rate:  [55-70] 70 (12/22 0731) Resp:  [18-20] 18 (12/22 0500) BP: (122-152)/(39-54) 122/39 mmHg (12/22 0500) SpO2:  [94 %-100 %] 97 % (12/22 0500) Weight:  [93.214 kg (205 lb 8 oz)] 93.214 kg (205 lb 8 oz) (12/22 0500) Weight change: -2.186 kg (-4 lb 13.1 oz)  Intake/Output from previous day:   Intake/Output this shift:    General appearance: alert, cooperative and moderately obese Resp: clear to auscultation bilaterally Cardio: S1, S2 normal and systolic murmur: holosystolic 2/6, blowing at apex GI: obese,pos bs, liver down 4 cm Extremities: AVF LLA B&T  Lab Results:  Recent Labs  12/28/12 0110 12/30/12 0050  WBC 6.1 6.3  HGB 8.8* 9.1*  HCT 26.6* 27.9*  PLT 184 186   BMET:  Recent Labs  12/28/12 0110 12/30/12 0050  NA 138 134*  K 3.6 3.9  CL 99 95*  CO2 31 30  GLUCOSE 80 114*  BUN 13 30*  CREATININE 6.57* 8.78*  CALCIUM 8.8 10.2   No results found for this basename: PTH,  in the last 72 hours Iron Studies: No results found for this basename: IRON, TIBC, TRANSFERRIN, FERRITIN,  in the last 72 hours  Studies/Results: No results found.  I have reviewed the patient's current medications.  Assessment/Plan: 1 ESRD for HD, lower vol and lower meds 2 Anemia ^ epo, anemic 3 CAD for CVS eval 4 HPTH 5 GOUT 6 Obesity P HD, epo, CAD surgery vs PTCAs    LOS: 4 days   [name] [name] 12/30/2012,11:18 AM

## 2012-12-30 NOTE — CV Procedure (Signed)
     Cardiac Catheterization Operative Report  [name] [name] [id number] 12/22/201410:03 AM [name], MD  Procedure Performed:  1. Left Heart Catheterization 2. Selective Coronary Angiography 3. Left ventricular angiogram  Operator: Lauree Chandler, MD  Indication: 79 yo male with history of ESRD on HD, HTN, HLD admitted with unstable angina. Cardiac markers negative.                                      Procedure Details: The risks, benefits, complications, treatment options, and expected outcomes were discussed with the patient. The patient and/or family concurred with the proposed plan, giving informed consent. The patient was brought to the cath lab after IV hydration was begun and oral premedication was given. The patient was further sedated with Versed and Fentanyl. The right groin was prepped and draped in the usual manner. Using the modified Seldinger access technique, a 5 French sheath was placed in the right femoral artery. Standard diagnostic catheters were used to perform selective coronary angiography. A pigtail catheter was used to perform a left ventricular angiogram.  There were no immediate complications. The patient was taken to the recovery area in stable condition.   Hemodynamic Findings: Central aortic pressure: 148/51 Left ventricular pressure: 145/7/14  Angiographic Findings:  Left main: Normal caliber. Mild plaque.   Left Anterior Descending Artery: Large caliber vessel that courses to the apex. The proximal vessel has diffuse 40% stenosis. The mid vessel has diffuse 80% stenosis. The distal vessel has diffuse 50% stenosis. The first diagonal branch is moderate in caliber with 90% ostial stenosis, proximal 90% stenosis. The second diagonal branch is moderate in caliber with mild plaque disease.   Circumflex Artery: Large caliber vessel with termination into a large obtuse marginal branch. There is a 99% mid stenosis leading into the obtuse  marginal branch. The proximal segment of the obtuse marginal branch has a 70% stenosis.   Right Coronary Artery: Large dominant vessel with 70% proximal stenosis, 100% mid stenosis. The distal vessel is seen to fill from left to right collaterals.   Left Ventricular Angiogram: LVEF=45% with inferior wall hypokinesis.   Impression: 1. Severe triple vessel CAD 2. Mild segmental LV systolic dysfunction 3. Unstable angina  Recommendations: Functional 79 yo male with severe multi-vessel CAD. Will ask CT surgery to see for possible CABG. If he is not felt to be a candidate for CABG, would require multi-vessel stenting.        Complications:  None. The patient tolerated the procedure well.

## 2012-12-30 NOTE — Progress Notes (Signed)
Pre-op Cardiac Surgery  Carotid Findings:  Bilateral:  1-39% ICA stenosis.  Vertebral artery flow is antegrade.      Upper Extremity Right Left  Brachial Pressures NA 132  Radial Waveforms mono tri  Ulnar Waveforms mono tri  Palmar Arch (Allen's Test) Within normal limits Decreases >50% with radial and ulnar compression    Lower  Extremity Right Left  Dorsalis Pedis    Anterior Tibial 129. bi 96, mono  Posterior Tibial 115, bi 113, bi  Ankle/Brachial Indices 0.98 0.86    Landry Mellow, RDMS, RVT 12/30/2012

## 2012-12-30 NOTE — Interval H&P Note (Signed)
History and Physical Interval Note:  12/30/2012 9:25 AM  [name]  has presented today for cardiac cath with the diagnosis of Chest pain  The various methods of treatment have been discussed with the patient and family. After consideration of risks, benefits and other options for treatment, the patient has consented to  Procedure(s): LEFT HEART CATHETERIZATION WITH CORONARY ANGIOGRAM (N/A) as a surgical intervention .  The patient's history has been reviewed, patient examined, no change in status, stable for surgery.  I have reviewed the patient's chart and labs.  Questions were answered to the patient's satisfaction.   Cath Lab Visit (complete for each Cath Lab visit)  Clinical Evaluation Leading to the Procedure:   ACS: no  Non-ACS:    Anginal Classification: CCS III  Anti-ischemic medical therapy: Minimal Therapy (1 class of medications)  Non-Invasive Test Results: No non-invasive testing performed  Prior CABG: No previous CABG          MCALHANY,CHRISTOPHER

## 2012-12-31 ENCOUNTER — Encounter (HOSPITAL_COMMUNITY): Payer: Medicare Other | Admitting: Anesthesiology

## 2012-12-31 ENCOUNTER — Encounter (HOSPITAL_COMMUNITY): Payer: Self-pay | Admitting: Anesthesiology

## 2012-12-31 ENCOUNTER — Encounter (HOSPITAL_COMMUNITY)
Admission: EM | Disposition: A | Discharge status: Home / Self Care | Payer: Medicare Other | Source: Home / Self Care | Attending: Cardiothoracic Surgery

## 2012-12-31 ENCOUNTER — Inpatient Hospital Stay (HOSPITAL_COMMUNITY): Payer: Medicare Other | Admitting: Anesthesiology

## 2012-12-31 ENCOUNTER — Inpatient Hospital Stay (HOSPITAL_COMMUNITY): Payer: Medicare Other

## 2012-12-31 DIAGNOSIS — Z951 Presence of aortocoronary bypass graft: Secondary | ICD-10-CM

## 2012-12-31 DIAGNOSIS — I2510 Atherosclerotic heart disease of native coronary artery without angina pectoris: Secondary | ICD-10-CM

## 2012-12-31 HISTORY — PX: CORONARY ARTERY BYPASS GRAFT: SHX141

## 2012-12-31 HISTORY — PX: INTRAOPERATIVE TRANSESOPHAGEAL ECHOCARDIOGRAM: SHX5062

## 2012-12-31 LAB — POCT I-STAT, CHEM 8
Calcium, Ion: 1.25 mmol/L (ref 1.13–1.30)
Creatinine, Ser: 7.2 mg/dL — ABNORMAL HIGH (ref 0.50–1.35)
Glucose, Bld: 122 mg/dL — ABNORMAL HIGH (ref 70–99)
Hemoglobin: 8.5 g/dL — ABNORMAL LOW (ref 13.0–17.0)
TCO2: 23 mmol/L (ref 0–100)

## 2012-12-31 LAB — CBC
HCT: 30.7 % — ABNORMAL LOW (ref 39.0–52.0)
HCT: 23.6 % — ABNORMAL LOW (ref 39.0–52.0)
HCT: 22.9 % — ABNORMAL LOW (ref 39.0–52.0)
Hemoglobin: 10.5 g/dL — ABNORMAL LOW (ref 13.0–17.0)
Hemoglobin: 7.9 g/dL — ABNORMAL LOW (ref 13.0–17.0)
Hemoglobin: 7.7 g/dL — ABNORMAL LOW (ref 13.0–17.0)
MCH: 31.6 pg (ref 26.0–34.0)
MCH: 31 pg (ref 26.0–34.0)
MCHC: 34.2 g/dL (ref 30.0–36.0)
MCHC: 33.5 g/dL (ref 30.0–36.0)
MCHC: 33.6 g/dL (ref 30.0–36.0)
MCV: 92.5 fL (ref 78.0–100.0)
MCV: 91.8 fL (ref 78.0–100.0)
MCV: 92.3 fL (ref 78.0–100.0)
Platelets: 206 10*3/uL (ref 150–400)
Platelets: 152 10*3/uL (ref 150–400)
RBC: 3.32 MIL/uL — ABNORMAL LOW (ref 4.22–5.81)
RBC: 2.57 MIL/uL — ABNORMAL LOW (ref 4.22–5.81)
RBC: 2.48 MIL/uL — ABNORMAL LOW (ref 4.22–5.81)
RDW: 13.3 % (ref 11.5–15.5)
RDW: 13.1 % (ref 11.5–15.5)
WBC: 7.7 10*3/uL (ref 4.0–10.5)
WBC: 10.6 10*3/uL — ABNORMAL HIGH (ref 4.0–10.5)
WBC: 9.1 10*3/uL (ref 4.0–10.5)

## 2012-12-31 LAB — POCT I-STAT 3, ART BLOOD GAS (G3+)
Bicarbonate: 31.2 mEq/L — ABNORMAL HIGH (ref 20.0–24.0)
Bicarbonate: 30.1 mEq/L — ABNORMAL HIGH (ref 20.0–24.0)
Bicarbonate: 25.7 mEq/L — ABNORMAL HIGH (ref 20.0–24.0)
O2 Saturation: 100 %
O2 Saturation: 100 %
O2 Saturation: 96 %
O2 Saturation: 95 %
Patient temperature: 36.1
TCO2: 33 mmol/L (ref 0–100)
TCO2: 31 mmol/L (ref 0–100)
TCO2: 29 mmol/L (ref 0–100)
TCO2: 27 mmol/L (ref 0–100)
TCO2: 25 mmol/L (ref 0–100)
pCO2 arterial: 45.6 mmHg — ABNORMAL HIGH (ref 35.0–45.0)
pCO2 arterial: 43.2 mmHg (ref 35.0–45.0)
pCO2 arterial: 29.3 mmHg — ABNORMAL LOW (ref 35.0–45.0)
pCO2 arterial: 40.7 mmHg (ref 35.0–45.0)
pH, Arterial: 7.442 (ref 7.350–7.450)
pH, Arterial: 7.412 (ref 7.350–7.450)
pH, Arterial: 7.505 — ABNORMAL HIGH (ref 7.350–7.450)
pO2, Arterial: 433 mmHg — ABNORMAL HIGH (ref 80.0–100.0)
pO2, Arterial: 402 mmHg — ABNORMAL HIGH (ref 80.0–100.0)
pO2, Arterial: 112 mmHg — ABNORMAL HIGH (ref 80.0–100.0)
pO2, Arterial: 77 mmHg — ABNORMAL LOW (ref 80.0–100.0)

## 2012-12-31 LAB — CREATININE, SERUM
Creatinine, Ser: 7.21 mg/dL — ABNORMAL HIGH (ref 0.50–1.35)
GFR calc Af Amer: 7 mL/min — ABNORMAL LOW (ref >90)
GFR calc non Af Amer: 6 mL/min — ABNORMAL LOW (ref >90)

## 2012-12-31 LAB — BASIC METABOLIC PANEL
BUN: 20 mg/dL (ref 6–23)
CO2: 30 mEq/L (ref 19–32)
Calcium: 10.2 mg/dL (ref 8.4–10.5)
Chloride: 94 mEq/L — ABNORMAL LOW (ref 96–112)
Creatinine, Ser: 6.88 mg/dL — ABNORMAL HIGH (ref 0.50–1.35)
GFR calc Af Amer: 8 mL/min — ABNORMAL LOW (ref >90)
GFR calc non Af Amer: 7 mL/min — ABNORMAL LOW (ref >90)
Glucose, Bld: 95 mg/dL (ref 70–99)
Potassium: 4.3 mEq/L (ref 3.5–5.1)
Sodium: 137 mEq/L (ref 135–145)

## 2012-12-31 LAB — POCT I-STAT 4, (NA,K, GLUC, HGB,HCT)
Glucose, Bld: 101 mg/dL — ABNORMAL HIGH (ref 70–99)
Glucose, Bld: 92 mg/dL (ref 70–99)
Glucose, Bld: 133 mg/dL — ABNORMAL HIGH (ref 70–99)
Glucose, Bld: 165 mg/dL — ABNORMAL HIGH (ref 70–99)
Glucose, Bld: 106 mg/dL — ABNORMAL HIGH (ref 70–99)
HCT: 26 % — ABNORMAL LOW (ref 39.0–52.0)
HCT: 24 % — ABNORMAL LOW (ref 39.0–52.0)
HCT: 26 % — ABNORMAL LOW (ref 39.0–52.0)
HCT: 26 % — ABNORMAL LOW (ref 39.0–52.0)
Hemoglobin: 8.8 g/dL — ABNORMAL LOW (ref 13.0–17.0)
Hemoglobin: 8.2 g/dL — ABNORMAL LOW (ref 13.0–17.0)
Hemoglobin: 8.2 g/dL — ABNORMAL LOW (ref 13.0–17.0)
Potassium: 4.2 mEq/L (ref 3.5–5.1)
Potassium: 4.2 mEq/L (ref 3.5–5.1)
Potassium: 4.7 mEq/L (ref 3.5–5.1)
Potassium: 4.4 mEq/L (ref 3.5–5.1)
Sodium: 136 mEq/L (ref 135–145)
Sodium: 134 mEq/L — ABNORMAL LOW (ref 135–145)
Sodium: 134 mEq/L — ABNORMAL LOW (ref 135–145)

## 2012-12-31 LAB — HEMOGLOBIN A1C
Hgb A1c MFr Bld: 5.5 % (ref <5.7)
Mean Plasma Glucose: 111 mg/dL (ref <117)

## 2012-12-31 LAB — HEMOGLOBIN AND HEMATOCRIT, BLOOD
HCT: 20.6 % — ABNORMAL LOW (ref 39.0–52.0)
Hemoglobin: 7.2 g/dL — ABNORMAL LOW (ref 13.0–17.0)

## 2012-12-31 LAB — GLUCOSE, CAPILLARY
Glucose-Capillary: 92 mg/dL (ref 70–99)
Glucose-Capillary: 83 mg/dL (ref 70–99)
Glucose-Capillary: 75 mg/dL (ref 70–99)
Glucose-Capillary: 90 mg/dL (ref 70–99)
Glucose-Capillary: 126 mg/dL — ABNORMAL HIGH (ref 70–99)

## 2012-12-31 LAB — APTT: aPTT: 34 seconds (ref 24–37)

## 2012-12-31 LAB — SURGICAL PCR SCREEN
MRSA, PCR: NEGATIVE
Staphylococcus aureus: NEGATIVE

## 2012-12-31 LAB — PROTIME-INR
INR: 1.45 (ref 0.00–1.49)
Prothrombin Time: 17.3 seconds — ABNORMAL HIGH (ref 11.6–15.2)

## 2012-12-31 LAB — MAGNESIUM: Magnesium: 2.1 mg/dL (ref 1.5–2.5)

## 2012-12-31 LAB — PLATELET COUNT: Platelets: 164 10*3/uL (ref 150–400)

## 2012-12-31 SURGERY — CORONARY ARTERY BYPASS GRAFTING (CABG)
Anesthesia: General | Site: Chest

## 2012-12-31 MED ORDER — DARBEPOETIN ALFA-POLYSORBATE 200 MCG/0.4ML IJ SOLN
200.0000 ug | INTRAMUSCULAR | Status: DC
  Administered 2013-01-01: 200 ug via INTRAVENOUS
  Filled 2012-12-31 (×2): qty 0.4

## 2012-12-31 MED ORDER — ACETAMINOPHEN 650 MG RE SUPP
650.0000 mg | Freq: Once | RECTAL | Status: AC
  Administered 2012-12-31: 650 mg via RECTAL
  Filled 2012-12-31: qty 1

## 2012-12-31 MED ORDER — ALBUMIN HUMAN 5 % IV SOLN
250.0000 mL | INTRAVENOUS | Status: AC | PRN
  Administered 2012-12-31 (×2): 250 mL via INTRAVENOUS

## 2012-12-31 MED ORDER — ACETAMINOPHEN 500 MG PO TABS
1000.0000 mg | ORAL_TABLET | Freq: Four times a day (QID) | ORAL | Status: DC
  Administered 2013-01-01 – 2013-01-03 (×8): 1000 mg via ORAL
  Filled 2012-12-31 (×14): qty 2

## 2012-12-31 MED ORDER — SODIUM CHLORIDE 0.9 % IV SOLN
20.0000 mg | INTRAVENOUS | Status: DC | PRN
  Administered 2012-12-31: 25 ug/min via INTRAVENOUS

## 2012-12-31 MED ORDER — DOPAMINE-DEXTROSE 3.2-5 MG/ML-% IV SOLN: 2.0000 ug/kg/min | INTRAVENOUS | Status: AC

## 2012-12-31 MED ORDER — DOCUSATE SODIUM 100 MG PO CAPS
200.0000 mg | ORAL_CAPSULE | Freq: Every day | ORAL | Status: DC
  Administered 2013-01-01 – 2013-01-03 (×3): 200 mg via ORAL
  Filled 2012-12-31 (×3): qty 2

## 2012-12-31 MED ORDER — SODIUM CHLORIDE 0.9 % IJ SOLN: 3.0000 mL | Freq: Two times a day (BID) | INTRAMUSCULAR | Status: DC

## 2012-12-31 MED ORDER — SODIUM CHLORIDE 0.9 % IV SOLN
INTRAVENOUS | Status: DC | PRN
  Administered 2012-12-31: 07:00:00 via INTRAVENOUS

## 2012-12-31 MED ORDER — SODIUM CHLORIDE 0.9 % IV SOLN
250.0000 mL | INTRAVENOUS | Status: DC
  Administered 2013-01-01: 250 mL via INTRAVENOUS

## 2012-12-31 MED ORDER — BISACODYL 10 MG RE SUPP: 10.0000 mg | Freq: Every day | RECTAL | Status: DC

## 2012-12-31 MED ORDER — PROPOFOL 10 MG/ML IV BOLUS
INTRAVENOUS | Status: DC | PRN
  Administered 2012-12-31: 50 mg via INTRAVENOUS

## 2012-12-31 MED ORDER — ASPIRIN EC 325 MG PO TBEC
325.0000 mg | DELAYED_RELEASE_TABLET | Freq: Every day | ORAL | Status: DC
  Administered 2013-01-01 – 2013-01-03 (×3): 325 mg via ORAL
  Filled 2012-12-31 (×4): qty 1

## 2012-12-31 MED ORDER — VECURONIUM BROMIDE 10 MG IV SOLR
INTRAVENOUS | Status: DC | PRN
  Administered 2012-12-31 (×4): 5 mg via INTRAVENOUS

## 2012-12-31 MED ORDER — MIDAZOLAM HCL 2 MG/2ML IJ SOLN: 2.0000 mg | INTRAMUSCULAR | Status: DC | PRN

## 2012-12-31 MED ORDER — PANTOPRAZOLE SODIUM 40 MG PO TBEC
40.0000 mg | DELAYED_RELEASE_TABLET | Freq: Every day | ORAL | Status: DC
  Administered 2013-01-02 – 2013-01-03 (×2): 40 mg via ORAL
  Filled 2012-12-31 (×2): qty 1

## 2012-12-31 MED ORDER — SODIUM CHLORIDE 0.45 % IV SOLN: INTRAVENOUS | Status: DC

## 2012-12-31 MED ORDER — 0.9 % SODIUM CHLORIDE (POUR BTL) OPTIME
TOPICAL | Status: DC | PRN
  Administered 2012-12-31: 1000 mL

## 2012-12-31 MED ORDER — NITROGLYCERIN IN D5W 200-5 MCG/ML-% IV SOLN: 0.0000 ug/min | INTRAVENOUS | Status: DC

## 2012-12-31 MED ORDER — ALBUMIN HUMAN 5 % IV SOLN
INTRAVENOUS | Status: DC | PRN
  Administered 2012-12-31 (×2): via INTRAVENOUS

## 2012-12-31 MED ORDER — MAGNESIUM SULFATE 40 MG/ML IJ SOLN: 4.0000 g | Freq: Once | INTRAMUSCULAR | Status: DC

## 2012-12-31 MED ORDER — INSULIN REGULAR BOLUS VIA INFUSION
0.0000 [IU] | Freq: Three times a day (TID) | INTRAVENOUS | Status: DC
  Filled 2012-12-31: qty 10

## 2012-12-31 MED ORDER — MORPHINE SULFATE 2 MG/ML IJ SOLN: 1.0000 mg | INTRAMUSCULAR | Status: AC | PRN

## 2012-12-31 MED ORDER — ACETAMINOPHEN 160 MG/5ML PO SOLN: 650.0000 mg | Freq: Once | ORAL | Status: AC

## 2012-12-31 MED ORDER — ONDANSETRON HCL 4 MG/2ML IJ SOLN
4.0000 mg | Freq: Four times a day (QID) | INTRAMUSCULAR | Status: DC | PRN
  Administered 2013-01-01: 4 mg via INTRAVENOUS
  Filled 2012-12-31: qty 2

## 2012-12-31 MED ORDER — ACETAMINOPHEN 160 MG/5ML PO SOLN: 1000.0000 mg | Freq: Four times a day (QID) | ORAL | Status: DC

## 2012-12-31 MED ORDER — INSULIN ASPART 100 UNIT/ML ~~LOC~~ SOLN
0.0000 [IU] | SUBCUTANEOUS | Status: DC
  Administered 2012-12-31 – 2013-01-01 (×3): 2 [IU] via SUBCUTANEOUS

## 2012-12-31 MED ORDER — VANCOMYCIN HCL IN DEXTROSE 1-5 GM/200ML-% IV SOLN
1000.0000 mg | Freq: Once | INTRAVENOUS | Status: AC
  Administered 2012-12-31: 1000 mg via INTRAVENOUS
  Filled 2012-12-31: qty 200

## 2012-12-31 MED ORDER — HEMOSTATIC AGENTS (NO CHARGE) OPTIME
TOPICAL | Status: DC | PRN
  Administered 2012-12-31: 1 via TOPICAL

## 2012-12-31 MED ORDER — MORPHINE SULFATE 2 MG/ML IJ SOLN
2.0000 mg | INTRAMUSCULAR | Status: DC | PRN
  Administered 2013-01-01: 2 mg via INTRAVENOUS
  Filled 2012-12-31 (×2): qty 1

## 2012-12-31 MED ORDER — FAMOTIDINE IN NACL 20-0.9 MG/50ML-% IV SOLN
20.0000 mg | Freq: Two times a day (BID) | INTRAVENOUS | Status: AC
  Administered 2012-12-31 (×2): 20 mg via INTRAVENOUS
  Filled 2012-12-31 (×2): qty 50

## 2012-12-31 MED ORDER — SODIUM CHLORIDE 0.9 % IJ SOLN: 3.0000 mL | INTRAMUSCULAR | Status: DC | PRN

## 2012-12-31 MED ORDER — ASPIRIN 81 MG PO CHEW: 324.0000 mg | CHEWABLE_TABLET | Freq: Every day | ORAL | Status: DC

## 2012-12-31 MED ORDER — METOPROLOL TARTRATE 1 MG/ML IV SOLN: 2.5000 mg | INTRAVENOUS | Status: DC | PRN

## 2012-12-31 MED ORDER — HEPARIN SODIUM (PORCINE) 1000 UNIT/ML IJ SOLN
INTRAMUSCULAR | Status: DC | PRN
  Administered 2012-12-31: 33000 [IU] via INTRAVENOUS

## 2012-12-31 MED ORDER — AMINOCAPROIC ACID 250 MG/ML IV SOLN
0.5000 g/h | Freq: Once | INTRAVENOUS | Status: DC
  Filled 2012-12-31: qty 20

## 2012-12-31 MED ORDER — BISACODYL 5 MG PO TBEC
10.0000 mg | DELAYED_RELEASE_TABLET | Freq: Every day | ORAL | Status: DC
  Administered 2013-01-01 – 2013-01-03 (×3): 10 mg via ORAL
  Filled 2012-12-31 (×3): qty 2

## 2012-12-31 MED ORDER — MIDAZOLAM HCL 5 MG/5ML IJ SOLN
INTRAMUSCULAR | Status: DC | PRN
  Administered 2012-12-31: 5 mg via INTRAVENOUS
  Administered 2012-12-31: 3 mg via INTRAVENOUS
  Administered 2012-12-31: 4 mg via INTRAVENOUS
  Administered 2012-12-31: 2 mg via INTRAVENOUS
  Administered 2012-12-31 (×2): 3 mg via INTRAVENOUS

## 2012-12-31 MED ORDER — ARTIFICIAL TEARS OP OINT
TOPICAL_OINTMENT | OPHTHALMIC | Status: DC | PRN
  Administered 2012-12-31: 1 via OPHTHALMIC

## 2012-12-31 MED ORDER — OXYCODONE HCL 5 MG PO TABS: 5.0000 mg | ORAL_TABLET | ORAL | Status: DC | PRN

## 2012-12-31 MED ORDER — SODIUM CHLORIDE 0.9 % IV SOLN: INTRAVENOUS | Status: DC

## 2012-12-31 MED ORDER — FENTANYL CITRATE 0.05 MG/ML IJ SOLN
INTRAMUSCULAR | Status: DC | PRN
  Administered 2012-12-31: 200 ug via INTRAVENOUS
  Administered 2012-12-31: 250 ug via INTRAVENOUS
  Administered 2012-12-31: 200 ug via INTRAVENOUS
  Administered 2012-12-31: 100 ug via INTRAVENOUS
  Administered 2012-12-31: 200 ug via INTRAVENOUS
  Administered 2012-12-31: 100 ug via INTRAVENOUS
  Administered 2012-12-31: 200 ug via INTRAVENOUS
  Administered 2012-12-31: 100 ug via INTRAVENOUS
  Administered 2012-12-31: 200 ug via INTRAVENOUS
  Administered 2012-12-31 (×2): 100 ug via INTRAVENOUS

## 2012-12-31 MED ORDER — LACTATED RINGERS IV SOLN: INTRAVENOUS | Status: DC

## 2012-12-31 MED ORDER — LACTATED RINGERS IV SOLN: 500.0000 mL | Freq: Once | INTRAVENOUS | Status: AC | PRN

## 2012-12-31 MED ORDER — METOPROLOL TARTRATE 12.5 MG HALF TABLET
12.5000 mg | ORAL_TABLET | Freq: Two times a day (BID) | ORAL | Status: DC
  Filled 2012-12-31 (×3): qty 1

## 2012-12-31 MED ORDER — DEXTROSE 5 % IV SOLN
1.5000 g | INTRAVENOUS | Status: AC
  Administered 2012-12-31 – 2013-01-01 (×2): 1.5 g via INTRAVENOUS
  Filled 2012-12-31 (×2): qty 1.5

## 2012-12-31 MED ORDER — POTASSIUM CHLORIDE 10 MEQ/50ML IV SOLN: 10.0000 meq | INTRAVENOUS | Status: AC

## 2012-12-31 MED ORDER — SODIUM CHLORIDE 0.9 % IV SOLN
INTRAVENOUS | Status: DC
  Filled 2012-12-31: qty 1

## 2012-12-31 MED ORDER — SODIUM CHLORIDE 0.9 % IV SOLN
INTRAVENOUS | Status: DC | PRN
  Administered 2012-12-31 (×2): via INTRAVENOUS

## 2012-12-31 MED ORDER — METOPROLOL TARTRATE 25 MG/10 ML ORAL SUSPENSION
12.5000 mg | Freq: Two times a day (BID) | ORAL | Status: DC
  Filled 2012-12-31 (×3): qty 5

## 2012-12-31 MED ORDER — PROTAMINE SULFATE 10 MG/ML IV SOLN
INTRAVENOUS | Status: DC | PRN
  Administered 2012-12-31: 40 mg via INTRAVENOUS
  Administered 2012-12-31: 20 mg via INTRAVENOUS
  Administered 2012-12-31: 30 mg via INTRAVENOUS
  Administered 2012-12-31 (×3): 20 mg via INTRAVENOUS

## 2012-12-31 MED ORDER — DEXMEDETOMIDINE HCL IN NACL 200 MCG/50ML IV SOLN
0.1000 ug/kg/h | INTRAVENOUS | Status: DC
  Administered 2012-12-31: 0.4 ug/kg/h via INTRAVENOUS
  Administered 2012-12-31: 0.1 ug/kg/h via INTRAVENOUS
  Administered 2012-12-31: 0.3 ug/kg/h via INTRAVENOUS
  Administered 2012-12-31: 0.2 ug/kg/h via INTRAVENOUS
  Administered 2012-12-31: 0.7 ug/kg/h via INTRAVENOUS
  Filled 2012-12-31: qty 50

## 2012-12-31 MED ORDER — PHENYLEPHRINE HCL 10 MG/ML IJ SOLN
0.0000 ug/min | INTRAMUSCULAR | Status: DC
  Administered 2012-12-31: 44.8 ug/min via INTRAVENOUS
  Administered 2013-01-01: 25 ug/min via INTRAVENOUS
  Administered 2013-01-03: 6 ug/min via INTRAVENOUS
  Filled 2012-12-31 (×6): qty 2

## 2012-12-31 SURGICAL SUPPLY — 73 items
ATTRACTOMAT 16X20 MAGNETIC DRP (DRAPES) ×3 IMPLANT
BAG DECANTER FOR FLEXI CONT (MISCELLANEOUS) ×3 IMPLANT
BANDAGE ELASTIC 4 VELCRO ST LF (GAUZE/BANDAGES/DRESSINGS) ×3 IMPLANT
BANDAGE ELASTIC 6 VELCRO ST LF (GAUZE/BANDAGES/DRESSINGS) ×3 IMPLANT
BANDAGE GAUZE ELAST BULKY 4 IN (GAUZE/BANDAGES/DRESSINGS) ×3 IMPLANT
BLADE STERNUM SYSTEM 6 (BLADE) ×3 IMPLANT
BNDG GAUZE ELAST 4 BULKY (GAUZE/BANDAGES/DRESSINGS) ×1 IMPLANT
CANISTER SUCTION 2500CC (MISCELLANEOUS) ×3 IMPLANT
CANN PRFSN .5XCNCT 15X34-48 (MISCELLANEOUS) ×2
CANNULA AORTIC HI-FLOW 6.5M20F (CANNULA) ×3 IMPLANT
CANNULA PRFSN .5XCNCT 15X34-48 (MISCELLANEOUS) ×2 IMPLANT
CANNULA VEN 2 STAGE (MISCELLANEOUS) ×3
CATH CPB KIT GERHARDT (MISCELLANEOUS) ×3 IMPLANT
CATH THORACIC 28FR (CATHETERS) ×3 IMPLANT
COVER SURGICAL LIGHT HANDLE (MISCELLANEOUS) ×3 IMPLANT
CRADLE DONUT ADULT HEAD (MISCELLANEOUS) ×3 IMPLANT
DRAIN CHANNEL 28F RND 3/8 FF (WOUND CARE) ×3 IMPLANT
DRAPE CARDIOVASCULAR INCISE (DRAPES) ×3
DRAPE SLUSH/WARMER DISC (DRAPES) ×3 IMPLANT
DRAPE SRG 135X102X78XABS (DRAPES) ×2 IMPLANT
DRSG AQUACEL AG ADV 3.5X14 (GAUZE/BANDAGES/DRESSINGS) ×3 IMPLANT
ELECT BLADE 4.0 EZ CLEAN MEGAD (MISCELLANEOUS) ×3
ELECT REM PT RETURN 9FT ADLT (ELECTROSURGICAL) ×6
ELECTRODE BLDE 4.0 EZ CLN MEGD (MISCELLANEOUS) ×2 IMPLANT
ELECTRODE REM PT RTRN 9FT ADLT (ELECTROSURGICAL) ×4 IMPLANT
GLOVE BIO SURGEON STRL SZ 6.5 (GLOVE) ×9 IMPLANT
GLOVE BIO SURGEON STRL SZ8 (GLOVE) ×1 IMPLANT
GLOVE BIOGEL PI IND STRL 6 (GLOVE) IMPLANT
GLOVE BIOGEL PI IND STRL 6.5 (GLOVE) IMPLANT
GLOVE BIOGEL PI IND STRL 7.0 (GLOVE) IMPLANT
GLOVE BIOGEL PI INDICATOR 6 (GLOVE) ×1
GLOVE BIOGEL PI INDICATOR 6.5 (GLOVE) ×1
GLOVE BIOGEL PI INDICATOR 7.0 (GLOVE) ×1
GOWN STRL NON-REIN LRG LVL3 (GOWN DISPOSABLE) ×12 IMPLANT
HEMOSTAT POWDER SURGIFOAM 1G (HEMOSTASIS) ×9 IMPLANT
HEMOSTAT SURGICEL 2X14 (HEMOSTASIS) ×3 IMPLANT
KIT BASIN OR (CUSTOM PROCEDURE TRAY) ×3 IMPLANT
KIT ROOM TURNOVER OR (KITS) ×3 IMPLANT
KIT SUCTION CATH 14FR (SUCTIONS) ×6 IMPLANT
KIT VASOVIEW W/TROCAR VH 2000 (KITS) ×3 IMPLANT
LEAD PACING MYOCARDI (MISCELLANEOUS) ×3 IMPLANT
MARKER GRAFT CORONARY BYPASS (MISCELLANEOUS) ×9 IMPLANT
NS IRRIG 1000ML POUR BTL (IV SOLUTION) ×15 IMPLANT
PACK OPEN HEART (CUSTOM PROCEDURE TRAY) ×3 IMPLANT
PAD ARMBOARD 7.5X6 YLW CONV (MISCELLANEOUS) ×6 IMPLANT
PAD ELECT DEFIB RADIOL ZOLL (MISCELLANEOUS) ×3 IMPLANT
PENCIL BUTTON HOLSTER BLD 10FT (ELECTRODE) ×3 IMPLANT
PUNCH AORTIC ROTATE 4.5MM 8IN (MISCELLANEOUS) ×1 IMPLANT
SPONGE GAUZE 2X2 NONSTERILE (GAUZE/BANDAGES/DRESSINGS) ×3 IMPLANT
SPONGE GAUZE 4X4 12PLY (GAUZE/BANDAGES/DRESSINGS) ×6 IMPLANT
SPONGE LAP 18X18 X RAY DECT (DISPOSABLE) ×3 IMPLANT
SUT BONE WAX W31G (SUTURE) ×3 IMPLANT
SUT PROLENE 3 0 SH1 36 (SUTURE) ×5 IMPLANT
SUT PROLENE 4 0 TF (SUTURE) ×6 IMPLANT
SUT PROLENE 6 0 CC (SUTURE) ×6 IMPLANT
SUT PROLENE 7 0 BV1 MDA (SUTURE) ×4 IMPLANT
SUT PROLENE 8 0 BV175 6 (SUTURE) ×1 IMPLANT
SUT STEEL 6MS V (SUTURE) ×3 IMPLANT
SUT STEEL SZ 6 DBL 3X14 BALL (SUTURE) ×3 IMPLANT
SUT VIC AB 1 CTX 18 (SUTURE) ×6 IMPLANT
SUT VIC AB 2-0 CT1 27 (SUTURE) ×3
SUT VIC AB 2-0 CT1 TAPERPNT 27 (SUTURE) IMPLANT
SUT VIC AB 3-0 X1 27 (SUTURE) ×1 IMPLANT
SUTURE E-PAK OPEN HEART (SUTURE) ×3 IMPLANT
SYSTEM SAHARA CHEST DRAIN ATS (WOUND CARE) ×3 IMPLANT
TAPE CLOTH SURG 4X10 WHT LF (GAUZE/BANDAGES/DRESSINGS) ×1 IMPLANT
TOWEL OR 17X24 6PK STRL BLUE (TOWEL DISPOSABLE) ×6 IMPLANT
TOWEL OR 17X26 10 PK STRL BLUE (TOWEL DISPOSABLE) ×6 IMPLANT
TRAY FOLEY IC TEMP SENS 14FR (CATHETERS) ×3 IMPLANT
TUBE FEEDING 8FR 16IN STR KANG (MISCELLANEOUS) ×3 IMPLANT
TUBING INSUFFLATION 10FT LAP (TUBING) ×3 IMPLANT
UNDERPAD 30X30 INCONTINENT (UNDERPADS AND DIAPERS) ×3 IMPLANT
WATER STERILE IRR 1000ML POUR (IV SOLUTION) ×6 IMPLANT

## 2012-12-31 NOTE — OR Nursing (Signed)
2nd call to SICU 1210

## 2012-12-31 NOTE — Procedures (Signed)
Extubation Procedure Note  Patient Details:   Name: Delvon Ambroise DOB: [birth year] MRN: [id number]   Airway Documentation:     Evaluation  O2 sats: stable throughout Complications: No apparent complications Patient did tolerate procedure well. Bilateral Breath Sounds: Diminished;Clear   Yes  ABG results called to MD by RN. NIF -25, VC 0.7L. Positive cuff leak. Patient able to vocalize and clear secretions. RT will continue to monitor.  Saunders Glance 12/31/2012, 5:14 PM

## 2012-12-31 NOTE — Progress Notes (Signed)
  Echocardiogram Echocardiogram Transesophageal has been performed.  Philipp Deputy 12/31/2012, 9:33 AM

## 2012-12-31 NOTE — Preoperative (Signed)
Beta Blockers   Reason not to administer Beta Blockers:Not Applicable 

## 2012-12-31 NOTE — Anesthesia Procedure Notes (Signed)
Procedure Name: Intubation Date/Time: 12/31/2012 7:52 AM Performed by: Neldon Newport Pre-anesthesia Checklist: Patient identified, Timeout performed, Emergency Drugs available, Suction available and Patient being monitored Patient Re-evaluated:Patient Re-evaluated prior to inductionOxygen Delivery Method: Circle system utilized Preoxygenation: Pre-oxygenation with 100% oxygen Intubation Type: IV induction Ventilation: Mask ventilation without difficulty Laryngoscope Size: Mac and 4 Grade View: Grade II Tube type: Oral Tube size: 8.0 mm Number of attempts: 1 Placement Confirmation: positive ETCO2,  ETT inserted through vocal cords under direct vision and breath sounds checked- equal and bilateral Secured at: 24 cm Tube secured with: Tape Dental Injury: Teeth and Oropharynx as per pre-operative assessment

## 2012-12-31 NOTE — OR Nursing (Signed)
1st call to SICU 1152

## 2012-12-31 NOTE — Progress Notes (Signed)
Subjective: Interval History: sedated, postop CABG on Vent.  Objective: Vital signs in last 24 hours: Temp:  [96.8 F (36 C)-97.7 F (36.5 C)] 96.8 F (36 C) (12/23 1415) Pulse Rate:  [51-90] 90 (12/23 1415) Resp:  [12-18] 12 (12/23 1415) BP: (94-146)/(37-80) 94/37 mmHg (12/23 1250) SpO2:  [98 %-100 %] 100 % (12/23 1415) Arterial Line BP: (83-133)/(33-48) 116/44 mmHg (12/23 1415) FiO2 (%):  [50 %] 50 % (12/23 1250) Weight:  [89.566 kg (197 lb 7.3 oz)-89.7 kg (197 lb 12 oz)] 89.566 kg (197 lb 7.3 oz) (12/23 0434) Weight change: -0.214 kg (-7.6 oz)  Intake/Output from previous day: 12/22 0701 - 12/23 0700 In: 0  Out: 3000  Intake/Output this shift: Total I/O In: 3752.6 [I.V.:2552.6; Blood:250; IV Piggyback:950] Out: 1080 [Blood:1000; Chest Tube:80]  General appearance: on vent, not responsive Resp: clear to auscultation bilaterally Chest wall: Mediastinal tubes Cardio: S1, S2 normal and systolic murmur: holosystolic 2/6, blowing at apex GI: no bs, soft, liver down 4 cm Extremities: avf, cool feet  Lab Results:  Recent Labs  12/31/12 0430  12/31/12 1051  12/31/12 1130 12/31/12 1150 12/31/12 1311  WBC 7.7  --   --   --  10.6*  --   --   HGB 10.5*  < > 7.2*  < > 7.9* 8.8* 8.8*  HCT 30.7*  < > 20.6*  < > 23.6* 26.0* 26.0*  PLT 206  --  164  --  137*  --   --   < > = values in this interval not displayed. BMET:  Recent Labs  12/30/12 0050 12/31/12 0430  12/31/12 1150 12/31/12 1311  NA 134* 137  < > 134* 136  K 3.9 4.3  < > 4.7 4.4  CL 95* 94*  --   --   --   CO2 30 30  --   --   --   GLUCOSE 114* 95  < > 165* 106*  BUN 30* 20  --   --   --   CREATININE 8.78* 6.88*  --   --   --   CALCIUM 10.2 10.2  --   --   --   < > = values in this interval not displayed. No results found for this basename: PTH,  in the last 72 hours Iron Studies: No results found for this basename: IRON, TIBC, TRANSFERRIN, FERRITIN,  in the last 72 hours  Studies/Results: Dg Chest  Portable 1 View  12/31/2012   CLINICAL DATA:  Status post coronary bypass grafting  EXAM: PORTABLE CHEST - 1 VIEW  COMPARISON:  12/26/2012  FINDINGS: Postsurgical changes are now seen. A Swan-Ganz catheter is noted within the pulmonary outflow tract. An endotracheal tube is seen approximately 5 cm above the carinal. A nasogastric catheter is noted although the tip of the catheter is coned off the film. A left-sided thoracostomy catheter is seen without pneumothorax. No focal infiltrate is noted. There is suggestion of left retrocardiac atelectasis.  IMPRESSION: Postsurgical changes with suggestion of left retrocardiac atelectasis.   Electronically Signed   By: Inez Catalina M.D.   On: 12/31/2012 13:36    I have reviewed the patient's current medications.  Assessment/Plan: 1 ESRD do  HD tomorrow. K ok. O2 ok.acidbase ok 2 S/p CABG  Per Dr. Roxy Horseman 3 Anemia use epo 4 HPTH meds as needed 5 Gout 6 Obesity P HD 12/24, epo, wean. Post op care    LOS: 5 days   Javaughn Opdahl L 12/31/2012,2:25 PM

## 2012-12-31 NOTE — Anesthesia Postprocedure Evaluation (Signed)
Anesthesia Post Note  Patient: [name] [name]  Procedure(s) Performed: Procedure(s) (LRB): CORONARY ARTERY BYPASS GRAFTING times four using left internal mammary and right saphenous vein (N/A) INTRAOPERATIVE TRANSESOPHAGEAL ECHOCARDIOGRAM (N/A)  Anesthesia type: General  Patient location: CICU  Post pain: Pain level controlled  Post assessment: Post-op Vital signs reviewed  Last Vitals: BP 77/47  Pulse 89  Temp(Src) 36.8 C (Oral)  Resp 16  Ht 6\' 1"  (1.854 m)  Wt 197 lb 7.3 oz (89.566 kg)  BMI 26.06 kg/m2  SpO2 100%  Post vital signs: Reviewed  Level of consciousness: sedated and intubated  Complications: No apparent anesthesia complications

## 2012-12-31 NOTE — Anesthesia Preprocedure Evaluation (Addendum)
Anesthesia Evaluation  Patient identified by MRN, date of birth, ID band Patient awake    Reviewed: Allergy & Precautions, H&P , NPO status , Patient's Chart, lab work & pertinent test results, reviewed documented beta blocker date and time   Airway Mallampati: II      Dental  (+) Dental Advisory Given and Poor Dentition   Pulmonary shortness of breath, former smoker,          Cardiovascular hypertension, Pt. on medications and Pt. on home beta blockers + CAD and + Past MI Rhythm:irregular  Echo 2011  - Left ventricle: Very difficult acoustic windows limit study. Overall LV systolic function appears grossly normal. Cannot fully  evaluate regional wall motion as endocardium is not well seen. The  cavity size was normal. Wall thickness was increased in a pattern of mild LVH. Doppler parameters are consistent with abnormal left   ventricular relaxation (grade 1 diastolic dysfunction).  - Aortic valve: AV is thickened, calcified. There does not appear to  be any signficant stenosis.  - Pulmonary arteries: PA peak pressure: 27mm Hg (S).    Neuro/Psych negative neurological ROS  negative psych ROS   GI/Hepatic negative GI ROS, Neg liver ROS,   Endo/Other  diabetes, Type 2, Oral Hypoglycemic Agents  Renal/GU Renal disease     Musculoskeletal negative musculoskeletal ROS (+)   Abdominal   Peds  Hematology  (+) Blood dyscrasia, anemia ,   Anesthesia Other Findings   Reproductive/Obstetrics                         Anesthesia Physical Anesthesia Plan  ASA: IV  Anesthesia Plan: General   Post-op Pain Management:    Induction: Intravenous  Airway Management Planned: Oral ETT  Additional Equipment: Arterial line, CVP, PA Cath, TEE and Ultrasound Guidance Line Placement  Intra-op Plan:   Post-operative Plan:   Informed Consent: I have reviewed the patients History and Physical, chart, labs and  discussed the procedure including the risks, benefits and alternatives for the proposed anesthesia with the patient or authorized representative who has indicated his/her understanding and acceptance.   Dental advisory given and Dental Advisory Given  Plan Discussed with: CRNA and Anesthesiologist  Anesthesia Plan Comments:        Anesthesia Quick Evaluation

## 2012-12-31 NOTE — Brief Op Note (Addendum)
      [address]       [place] [address]             [phone]     12/26/2012 - 12/31/2012  10:57 AM  PATIENT:  [name]  79 y.o. male  PRE-OPERATIVE DIAGNOSIS:  Coronary Artery Disease  POST-OPERATIVE DIAGNOSIS:  Coronary Artery Disease  PROCEDURE:  Procedure(s): CORONARY ARTERY BYPASS GRAFTING (CABG)X4 LIMA-LAD: SVG-RAMUS; SVG-PD; SVG-OM INTRAOPERATIVE TRANSESOPHAGEAL ECHOCARDIOGRAM [facility] RIGHT LEG  SURGEON:  Surgeon(s): Grace Isaac, MD  PHYSICIAN ASSISTANT: WAYNE GOLD PA-C  ANESTHESIA:   general  PATIENT CONDITION:  ICU - intubated and hemodynamically stable.  PRE-OPERATIVE WEIGHT: [id number]  COMPLICATIONS: NO KNOWN

## 2012-12-31 NOTE — Progress Notes (Signed)
Patient ID: Shayan Kowitz, male   DOB: 1930/04/16, 79 y.o.   MRN: [id number] EVENING ROUNDS NOTE :     [address]       [place] [address]             [phone]                 Day of Surgery Procedure(s) (LRB): CORONARY ARTERY BYPASS GRAFTING times four using left internal mammary and right saphenous vein (N/A) INTRAOPERATIVE TRANSESOPHAGEAL ECHOCARDIOGRAM (N/A)  Total Length of Stay:  LOS: 5 days  BP 98/51  Pulse 90  Temp(Src) 98.1 F (36.7 C) (Oral)  Resp 19  Ht 6\' 1"  (1.854 m)  Wt 197 lb 7.3 oz (89.566 kg)  BMI 26.06 kg/m2  SpO2 100%  .Intake/Output     12/22 0701 - 12/23 0700 12/23 0701 - 12/24 0700   I.V. (mL/kg)  2740.1 (30.6)   Blood  250   IV Piggyback  1200   Total Intake(mL/kg)  4190.1 (46.8)   Other 3000    Blood  1000   Chest Tube  150   Total Output 3000 1150   Net -3000 +3040.1          . sodium chloride    . sodium chloride    . [START ON 01/01/2013] sodium chloride    . dexmedetomidine Stopped (12/31/12 1625)  . lactated ringers    . nitroGLYCERIN    . phenylephrine (NEO-SYNEPHRINE) Adult infusion 44.8 mcg/min (12/31/12 1325)     Lab Results  Component Value Date   WBC 10.6* 12/31/2012   HGB 8.8* 12/31/2012   HCT 26.0* 12/31/2012   PLT 137* 12/31/2012   GLUCOSE 106* 12/31/2012   CHOL 170 12/27/2012   TRIG 136 12/27/2012   HDL 36* 12/27/2012   LDLCALC 107* 12/27/2012   ALT 23 01/26/2009   NA 136 12/31/2012   K 4.4 12/31/2012   CL 94* 12/31/2012   CREATININE 6.88* 12/31/2012   BUN 20 12/31/2012   CO2 30 12/31/2012   TSH 0.914 12/26/2012   INR 1.45 12/31/2012   HGBA1C 5.5 12/30/2012   Now  extubated K ok, dialysis tomorrow  Grace Isaac MD  Beeper [phone] Office [phone] 12/31/2012 5:53 PM

## 2012-12-31 NOTE — Transfer of Care (Signed)
Immediate Anesthesia Transfer of Care Note  Patient: [name] [name]  Procedure(s) Performed: Procedure(s): CORONARY ARTERY BYPASS GRAFTING times four using left internal mammary and right saphenous vein (N/A) INTRAOPERATIVE TRANSESOPHAGEAL ECHOCARDIOGRAM (N/A)  Patient Location: SICU  Anesthesia Type:General  Level of Consciousness: Patient remains intubated per anesthesia plan  Airway & Oxygen Therapy: Patient remains intubated per anesthesia plan and Patient placed on Ventilator (see vital sign flow sheet for setting)  Post-op Assessment: Post -op Vital signs reviewed and stable  Post vital signs: Reviewed and stable  Complications: No apparent anesthesia complications

## 2013-01-01 ENCOUNTER — Encounter (HOSPITAL_COMMUNITY): Payer: Self-pay | Admitting: Cardiothoracic Surgery

## 2013-01-01 ENCOUNTER — Inpatient Hospital Stay (HOSPITAL_COMMUNITY): Payer: Medicare Other

## 2013-01-01 DIAGNOSIS — I2510 Atherosclerotic heart disease of native coronary artery without angina pectoris: Secondary | ICD-10-CM

## 2013-01-01 LAB — POCT I-STAT, CHEM 8
BUN: 13 mg/dL (ref 6–23)
Calcium, Ion: 1.24 mmol/L (ref 1.13–1.30)
HCT: 30 % — ABNORMAL LOW (ref 39.0–52.0)
Hemoglobin: 10.2 g/dL — ABNORMAL LOW (ref 13.0–17.0)
Sodium: 137 mEq/L (ref 135–145)
TCO2: 26 mmol/L (ref 0–100)

## 2013-01-01 LAB — COMPREHENSIVE METABOLIC PANEL
ALT: 8 U/L (ref 0–53)
AST: 29 U/L (ref 0–37)
Alkaline Phosphatase: 40 U/L (ref 39–117)
CO2: 21 mEq/L (ref 19–32)
Calcium: 9.1 mg/dL (ref 8.4–10.5)
Creatinine, Ser: 7.96 mg/dL — ABNORMAL HIGH (ref 0.50–1.35)
GFR calc non Af Amer: 6 mL/min — ABNORMAL LOW (ref >90)
Glucose, Bld: 132 mg/dL — ABNORMAL HIGH (ref 70–99)
Potassium: 5.4 mEq/L — ABNORMAL HIGH (ref 3.5–5.1)
Sodium: 135 mEq/L (ref 135–145)
Total Protein: 6 g/dL (ref 6.0–8.3)

## 2013-01-01 LAB — CBC
HCT: 22.1 % — ABNORMAL LOW (ref 39.0–52.0)
Hemoglobin: 7.6 g/dL — ABNORMAL LOW (ref 13.0–17.0)
Hemoglobin: 9.7 g/dL — ABNORMAL LOW (ref 13.0–17.0)
MCH: 31.2 pg (ref 26.0–34.0)
MCHC: 34.5 g/dL (ref 30.0–36.0)
MCV: 91.3 fL (ref 78.0–100.0)
MCV: 90.4 fL (ref 78.0–100.0)
Platelets: 144 10*3/uL — ABNORMAL LOW (ref 150–400)
RBC: 2.42 MIL/uL — ABNORMAL LOW (ref 4.22–5.81)
RBC: 3.11 MIL/uL — ABNORMAL LOW (ref 4.22–5.81)
RDW: 13.3 % (ref 11.5–15.5)
WBC: 9.2 10*3/uL (ref 4.0–10.5)

## 2013-01-01 LAB — MAGNESIUM
Magnesium: 1.9 mg/dL (ref 1.5–2.5)
Magnesium: 1.8 mg/dL (ref 1.5–2.5)

## 2013-01-01 LAB — GLUCOSE, CAPILLARY
Glucose-Capillary: 143 mg/dL — ABNORMAL HIGH (ref 70–99)
Glucose-Capillary: 96 mg/dL (ref 70–99)
Glucose-Capillary: 103 mg/dL — ABNORMAL HIGH (ref 70–99)
Glucose-Capillary: 115 mg/dL — ABNORMAL HIGH (ref 70–99)

## 2013-01-01 LAB — IRON AND TIBC
Saturation Ratios: 6 % — ABNORMAL LOW (ref 20–55)
UIBC: 177 ug/dL (ref 125–400)

## 2013-01-01 LAB — PHOSPHORUS: Phosphorus: 4.3 mg/dL (ref 2.3–4.6)

## 2013-01-01 LAB — CREATININE, SERUM: Creatinine, Ser: 4.18 mg/dL — ABNORMAL HIGH (ref 0.50–1.35)

## 2013-01-01 MED ORDER — SODIUM CHLORIDE 0.9 % IV SOLN: 100.0000 mL | INTRAVENOUS | Status: DC | PRN

## 2013-01-01 MED ORDER — PENTAFLUOROPROP-TETRAFLUOROETH EX AERO: 1.0000 "application " | INHALATION_SPRAY | CUTANEOUS | Status: DC | PRN

## 2013-01-01 MED ORDER — METOPROLOL TARTRATE 25 MG/10 ML ORAL SUSPENSION
12.5000 mg | Freq: Two times a day (BID) | ORAL | Status: DC
  Filled 2013-01-01 (×5): qty 5

## 2013-01-01 MED ORDER — ENOXAPARIN SODIUM 30 MG/0.3ML ~~LOC~~ SOLN
30.0000 mg | SUBCUTANEOUS | Status: DC
  Administered 2013-01-01 – 2013-01-06 (×6): 30 mg via SUBCUTANEOUS
  Filled 2013-01-01 (×7): qty 0.3

## 2013-01-01 MED ORDER — HEPARIN SODIUM (PORCINE) 1000 UNIT/ML DIALYSIS
100.0000 [IU]/kg | INTRAMUSCULAR | Status: DC | PRN
  Filled 2013-01-01: qty 9

## 2013-01-01 MED ORDER — HEPARIN SODIUM (PORCINE) 1000 UNIT/ML DIALYSIS
1000.0000 [IU] | INTRAMUSCULAR | Status: DC | PRN
  Filled 2013-01-01: qty 1

## 2013-01-01 MED ORDER — HEPARIN SODIUM (PORCINE) 1000 UNIT/ML DIALYSIS: 40.0000 [IU]/kg | Freq: Once | INTRAMUSCULAR | Status: DC

## 2013-01-01 MED ORDER — INSULIN ASPART 100 UNIT/ML ~~LOC~~ SOLN
0.0000 [IU] | SUBCUTANEOUS | Status: DC
  Administered 2013-01-02: 2 [IU] via SUBCUTANEOUS

## 2013-01-01 MED ORDER — ALTEPLASE 2 MG IJ SOLR
2.0000 mg | Freq: Once | INTRAMUSCULAR | Status: AC | PRN
  Filled 2013-01-01: qty 2

## 2013-01-01 MED ORDER — LIDOCAINE HCL (PF) 1 % IJ SOLN: 5.0000 mL | INTRAMUSCULAR | Status: DC | PRN

## 2013-01-01 MED ORDER — INSULIN DETEMIR 100 UNIT/ML ~~LOC~~ SOLN
6.0000 [IU] | Freq: Every day | SUBCUTANEOUS | Status: DC
  Administered 2013-01-01 – 2013-01-04 (×4): 6 [IU] via SUBCUTANEOUS
  Filled 2013-01-01 (×5): qty 0.06

## 2013-01-01 MED ORDER — METOPROLOL TARTRATE 12.5 MG HALF TABLET
12.5000 mg | ORAL_TABLET | Freq: Two times a day (BID) | ORAL | Status: DC
  Administered 2013-01-01: 12.5 mg via ORAL
  Filled 2013-01-01 (×5): qty 1

## 2013-01-01 MED ORDER — LIDOCAINE-PRILOCAINE 2.5-2.5 % EX CREA
1.0000 "application " | TOPICAL_CREAM | CUTANEOUS | Status: DC | PRN
  Filled 2013-01-01: qty 5

## 2013-01-01 MED ORDER — NEPRO/CARBSTEADY PO LIQD
237.0000 mL | ORAL | Status: DC | PRN
  Filled 2013-01-01: qty 237

## 2013-01-01 MED FILL — Heparin Sodium (Porcine) Inj 1000 Unit/ML: INTRAMUSCULAR | Qty: 30 | Status: AC

## 2013-01-01 MED FILL — Magnesium Sulfate Inj 50%: INTRAMUSCULAR | Qty: 10 | Status: AC

## 2013-01-01 MED FILL — Potassium Chloride Inj 2 mEq/ML: INTRAVENOUS | Qty: 40 | Status: AC

## 2013-01-01 NOTE — Procedures (Signed)
I was present at this session.  I have reviewed the session itself and made appropriate changes.  HD via RLA avf, bp 90s, to get blood  [name] [name] L 12/24/201410:18 AM

## 2013-01-01 NOTE — Progress Notes (Signed)
     SUBJECTIVE: No events overnight.   BP 114/43  Pulse 79  Temp(Src) 97.5 F (36.4 C) (Core (Comment))  Resp 17  Ht 6\' 1"  (1.854 m)  Wt 216 lb 0.8 oz (98 kg)  BMI 28.51 kg/m2  SpO2 99%  Intake/Output Summary (Last 24 hours) at 01/01/13 1231 Last data filed at 01/01/13 1101  Gross per 24 hour  Intake 2230.05 ml  Output    480 ml  Net 1750.05 ml    PHYSICAL EXAM General: Well developed, well nourished, in no acute distress. Alert and oriented x 3.  Psych:  Good affect, responds appropriately Neck: No JVD. No masses noted.  Lungs: Clear bilaterally with no wheezes or rhonci noted.  Heart: RRR with no murmurs noted. Abdomen: Bowel sounds are present. Soft, non-tender.  Extremities: No lower extremity edema.   LABS: Basic Metabolic Panel:  Recent Labs  12/30/12 0050 12/31/12 0430  12/31/12 1800 12/31/12 1818 01/01/13 0445  NA 134* 137  < >  --  136 135  K 3.9 4.3  < >  --  4.9 5.4*  CL 95* 94*  --   --  100 98  CO2 30 30  --   --   --  21  GLUCOSE 114* 95  < >  --  122* 132*  BUN 30* 20  --   --  25* 31*  CREATININE 8.78* 6.88*  --  7.21* 7.20* 7.96*  CALCIUM 10.2 10.2  --   --   --  9.1  MG  --   --   --  2.1  --  1.9  PHOS 3.7  --   --   --   --  4.3  < > = values in this interval not displayed. CBC:  Recent Labs  12/31/12 1800 12/31/12 1818 01/01/13 0445  WBC 9.1  --  9.2  HGB 7.7* 8.5* 7.6*  HCT 22.9* 25.0* 22.1*  MCV 92.3  --  91.3  PLT 152  --  151   Current Meds: . acetaminophen  1,000 mg Oral Q6H   Or  . acetaminophen (TYLENOL) oral liquid 160 mg/5 mL  1,000 mg Per Tube Q6H  . aspirin EC  325 mg Oral Daily   Or  . aspirin  324 mg Per Tube Daily  . bisacodyl  10 mg Oral Daily   Or  . bisacodyl  10 mg Rectal Daily  . cefUROXime (ZINACEF)  IV  1.5 g Intravenous Q24H  . darbepoetin (ARANESP) injection - DIALYSIS  200 mcg Intravenous Q Wed-HD  . docusate sodium  200 mg Oral Daily  . DOPamine  2-20 mcg/kg/min Intravenous To OR  .  enoxaparin (LOVENOX) injection  30 mg Subcutaneous Q24H  . heparin  40 Units/kg Dialysis Once in dialysis  . insulin aspart  0-24 Units Subcutaneous Q4H  . insulin detemir  6 Units Subcutaneous Daily  . magnesium sulfate  4 g Intravenous Once  . metoprolol tartrate  12.5 mg Oral BID   Or  . metoprolol tartrate  12.5 mg Per Tube BID  . [START ON 01/02/2013] pantoprazole  40 mg Oral Daily     ASSESSMENT AND PLAN:  1. Unstable angina/CAD: Pt s/p 4V CABG, hemodynamically stable. Atrial pacing. Volume management per Nephrology.   MCALHANY,CHRISTOPHER  12/24/201412:31 PM

## 2013-01-01 NOTE — Progress Notes (Addendum)
[address]       [place] [address]             [phone]        1 Day Post-Op Procedure(s) (LRB): CORONARY ARTERY BYPASS GRAFTING times four using left internal mammary and right saphenous vein (N/A) INTRAOPERATIVE TRANSESOPHAGEAL ECHOCARDIOGRAM (N/A)  Subjective: Patient awake and alert  Objective: Vital signs in last 24 hours: Temp:  [96.8 F (36 C)-99.1 F (37.3 C)] 98.6 F (37 C) (12/24 0700) Pulse Rate:  [72-91] 74 (12/24 0700) Cardiac Rhythm:  [-] Atrial paced (12/23 1300) Resp:  [12-30] 27 (12/24 0700) BP: (77-121)/(37-58) 93/40 mmHg (12/24 0700) SpO2:  [93 %-100 %] 99 % (12/24 0700) Arterial Line BP: (81-135)/(33-73) 105/41 mmHg (12/24 0700) FiO2 (%):  [40 %-50 %] 40 % (12/23 1630) Weight:  [95.1 kg (209 lb 10.5 oz)] 95.1 kg (209 lb 10.5 oz) (12/24 0500)  Pre op weight  89 kg Current Weight  01/01/13 95.1 kg (209 lb 10.5 oz)    Hemodynamic parameters for last 24 hours: PAP: (17-41)/(8-24) 34/18 mmHg CO:  [4.8 L/min-6.5 L/min] 5.4 L/min CI:  [2.2 L/min/m2-3 L/min/m2] 2.5 L/min/m2  Intake/Output from previous day: 12/23 0701 - 12/24 0700 In: 5061.7 [I.V.:3311.7; Blood:250; IV Piggyback:1500] Out: [id number] [Blood:1000; Chest Tube:460]   Physical Exam:  Cardiovascular: RRR, no murmurs, gallops. Rub with chest tubes in place. Pulmonary: Diminished at bases bilaterally; no rales, wheezes, or rhonchi. Abdomen: Soft, non tender, bowel sounds present. Extremities: Mild bilateral lower extremity edema. Wounds: Dressings intact, clean and dry.  . Neurologic: Grossly intact without focal deficits  Lab Results: CBC: Recent Labs  12/31/12 1800 12/31/12 1818 01/01/13 0445  WBC 9.1  --  9.2  HGB 7.7* 8.5* 7.6*  HCT 22.9* 25.0* 22.1*  PLT 152  --  151   BMET:  Recent Labs  12/31/12 0430  12/31/12 1818 01/01/13 0445  NA 137  < > 136 135  K 4.3  < > 4.9 5.4*  CL 94*  --  100 98  CO2 30  --   --  21  GLUCOSE 95  < > 122* 132*    BUN 20  --  25* 31*  CREATININE 6.88*  < > 7.20* 7.96*  CALCIUM 10.2  --   --  9.1  < > = values in this interval not displayed.  PT/INR:  Lab Results  Component Value Date   INR 1.45 12/31/2012   INR 1.12 12/30/2012   INR 1.10 12/26/2012   ABG:  INR: Will add last result for INR, ABG once components are confirmed Will add last 4 CBG results once components are confirmed  Assessment/Plan:  1. CV - SR in the 70's. On Dopamine and Neo synephrine gttps-wean Dopamine as tolerates.Lopressor 12.5 bid with parameters. Will hold this am is going to undergo HD. 2.  Pulmonary - Chest tubes with 460 cc of output since surgery. Likely remove mediastinal later this am and leave pleural for now. CXR this am shows no pneumothorax, bibasilar atelectasis, cardiomegaly, and small pleural effusions L>R. 3. Volume Overload - per nephrology 4.  Acute blood loss anemia - H and H 7.6 and 22.1. Receiving one unit PRBC and will have one more on HD. Continue Aranesp. 5.CKD-HD per nephrology 6.CBGs 126/105/129. Pre op HGA1C 5.5. Start low dose Levemir to keep off insulin drip.Will stop accu checks and SS PRN 24 ours after transfer 7.Please see progression orders  [name] [name] MPA-C 01/01/2013,7:50 AM  I  have seen and examined [name] [name] and agree with the above assessment  and plan.  Grace Isaac MD Beeper [phone] Office [phone] 01/01/2013 11:11 AM

## 2013-01-01 NOTE — Progress Notes (Signed)
PT Cancellation Note  Patient Details Name: Zackary Kose MRN: [id number] DOB: Jun 05, 1930   Cancelled Treatment:    Reason Eval/Treat Not Completed: Patient at procedure or test/unavailable; patient undergoing hemodialysis currently.  Will attempt later today as time permits.   Elyjah Hazan,CYNDI 01/01/2013, 12:50 PM

## 2013-01-01 NOTE — Progress Notes (Signed)
Subjective: Interval History: has complaints sore but ok.  Objective: Vital signs in last 24 hours: Temp:  [96.8 F (36 C)-99.1 F (37.3 C)] 98.6 F (37 C) (12/24 0700) Pulse Rate:  [72-91] 74 (12/24 0700) Resp:  [12-30] 27 (12/24 0700) BP: (77-121)/(37-58) 93/40 mmHg (12/24 0700) SpO2:  [93 %-100 %] 99 % (12/24 0700) Arterial Line BP: (81-135)/(33-73) 105/41 mmHg (12/24 0700) FiO2 (%):  [40 %-50 %] 40 % (12/23 1630) Weight:  [95.1 kg (209 lb 10.5 oz)] 95.1 kg (209 lb 10.5 oz) (12/24 0500) Weight change: 2.1 kg (4 lb 10.1 oz)  Intake/Output from previous day: 12/23 0701 - 12/24 0700 In: 5061.7 [I.V.:3311.7; Blood:250; IV Piggyback:1500] Out: [id number] [Blood:1000; Chest Tube:460] Intake/Output this shift:    General appearance: alert, cooperative and moderately obese Resp: rales base - left, rhonchi base - left and poor effort Cardio: S1, S2 normal and friction rub heard diffusely GI: pos bs, but diminished, mediastinal tubes Extremities: edema Tr, LUA AVF and feet warm  Lab Results:  Recent Labs  12/31/12 1800 12/31/12 1818 01/01/13 0445  WBC 9.1  --  9.2  HGB 7.7* 8.5* 7.6*  HCT 22.9* 25.0* 22.1*  PLT 152  --  151   BMET:  Recent Labs  12/31/12 0430  12/31/12 1818 01/01/13 0445  NA 137  < > 136 135  K 4.3  < > 4.9 5.4*  CL 94*  --  100 98  CO2 30  --   --  21  GLUCOSE 95  < > 122* 132*  BUN 20  --  25* 31*  CREATININE 6.88*  < > 7.20* 7.96*  CALCIUM 10.2  --   --  9.1  < > = values in this interval not displayed. No results found for this basename: PTH,  in the last 72 hours Iron Studies: No results found for this basename: IRON, TIBC, TRANSFERRIN, FERRITIN,  in the last 72 hours  Studies/Results: Dg Chest Portable 1 View  12/31/2012   CLINICAL DATA:  Status post coronary bypass grafting  EXAM: PORTABLE CHEST - 1 VIEW  COMPARISON:  12/26/2012  FINDINGS: Postsurgical changes are now seen. A Swan-Ganz catheter is noted within the pulmonary outflow tract. An  endotracheal tube is seen approximately 5 cm above the carinal. A nasogastric catheter is noted although the tip of the catheter is coned off the film. A left-sided thoracostomy catheter is seen without pneumothorax. No focal infiltrate is noted. There is suggestion of left retrocardiac atelectasis.  IMPRESSION: Postsurgical changes with suggestion of left retrocardiac atelectasis.   Electronically Signed   By: Inez Catalina M.D.   On: 12/31/2012 13:36    I have reviewed the patient's current medications.  Assessment/Plan: 1 ESRD for HD, will tx , keep even 2 S/P CABG per Dr. Roxy Horseman 3 Anemia getting blood, give 1 more unit 4 obesity 5 HPTH meds P HD, epo. Transfuse, slowly mobilize    LOS: 6 days   [name] [name] L 01/01/2013,7:25 AM

## 2013-01-02 ENCOUNTER — Inpatient Hospital Stay (HOSPITAL_COMMUNITY): Payer: Medicare Other

## 2013-01-02 LAB — GLUCOSE, CAPILLARY
Glucose-Capillary: 104 mg/dL — ABNORMAL HIGH (ref 70–99)
Glucose-Capillary: 107 mg/dL — ABNORMAL HIGH (ref 70–99)
Glucose-Capillary: 111 mg/dL — ABNORMAL HIGH (ref 70–99)

## 2013-01-02 LAB — RENAL FUNCTION PANEL
Albumin: 3.1 g/dL — ABNORMAL LOW (ref 3.5–5.2)
BUN: 25 mg/dL — ABNORMAL HIGH (ref 6–23)
Calcium: 9.6 mg/dL (ref 8.4–10.5)
Creatinine, Ser: 6.12 mg/dL — ABNORMAL HIGH (ref 0.50–1.35)
Phosphorus: 5 mg/dL — ABNORMAL HIGH (ref 2.3–4.6)

## 2013-01-02 LAB — CBC
HCT: 24.6 % — ABNORMAL LOW (ref 39.0–52.0)
MCH: 31.3 pg (ref 26.0–34.0)
MCHC: 34.6 g/dL (ref 30.0–36.0)
MCV: 90.4 fL (ref 78.0–100.0)
RBC: 2.72 MIL/uL — ABNORMAL LOW (ref 4.22–5.81)
RDW: 14.3 % (ref 11.5–15.5)

## 2013-01-02 MED ORDER — INSULIN ASPART 100 UNIT/ML ~~LOC~~ SOLN: 0.0000 [IU] | Freq: Three times a day (TID) | SUBCUTANEOUS | Status: DC

## 2013-01-02 MED ORDER — SODIUM CHLORIDE 0.9 % IV SOLN
125.0000 mg | INTRAVENOUS | Status: DC
  Administered 2013-01-04: 125 mg via INTRAVENOUS
  Filled 2013-01-02 (×5): qty 10

## 2013-01-02 NOTE — Progress Notes (Signed)
2 Days Post-Op Procedure(s) (LRB): CORONARY ARTERY BYPASS GRAFTING times four using left internal mammary and right saphenous vein (N/A) INTRAOPERATIVE TRANSESOPHAGEAL ECHOCARDIOGRAM (N/A) Subjective: Feels weak, not much pain, denies nausea Had to be put back on neo overnight for low BP  Objective: Vital signs in last 24 hours: Temp:  [97.3 F (36.3 C)-99.1 F (37.3 C)] 97.9 F (36.6 C) (12/25 0744) Pulse Rate:  [60-96] 90 (12/25 0800) Cardiac Rhythm:  [-] Atrial paced (12/25 0400) Resp:  [15-32] 16 (12/25 0800) BP: (74-129)/(31-75) 74/35 mmHg (12/25 0800) SpO2:  [91 %-100 %] 95 % (12/25 0800) Arterial Line BP: (78-134)/(34-44) 134/43 mmHg (12/24 1700) Weight:  [204 lb 2.3 oz (92.6 kg)-216 lb 0.8 oz (98 kg)] 204 lb 2.3 oz (92.6 kg) (12/25 0413)  Hemodynamic parameters for last 24 hours: PAP: (27-38)/(12-20) 29/13 mmHg CO:  [4.4 L/min-5.1 L/min] 4.6 L/min CI:  [2.1 L/min/m2-2.4 L/min/m2] 2.2 L/min/m2  Intake/Output from previous day: 12/24 0701 - 12/25 0700 In: 1111.9 [P.O.:150; I.V.:623.9; Blood:288; IV Piggyback:50] Out: 2637 [Chest Tube:135] Intake/Output this shift:    General appearance: alert and no distress Neurologic: intact Heart: regular rate and rhythm Lungs: diminished breath sounds bibasilar Abdomen: normal findings: soft, non-tender  Lab Results:  Recent Labs  01/01/13 1645 01/01/13 1654 01/02/13 0400  WBC 11.8*  --  9.8  HGB 9.7* 10.2* 8.5*  HCT 28.1* 30.0* 24.6*  PLT 144*  --  142*   BMET:  Recent Labs  01/01/13 0445  01/01/13 1654 01/02/13 0400  NA 135  --  137 135  K 5.4*  --  4.1 4.5  CL 98  --  96 95*  CO2 21  --   --  27  GLUCOSE 132*  --  127* 129*  BUN 31*  --  13 25*  CREATININE 7.96*  < > 4.50* 6.12*  CALCIUM 9.1  --   --  9.6  < > = values in this interval not displayed.  PT/INR:  Recent Labs  12/31/12 1130  LABPROT 17.3*  INR 1.45   ABG    Component Value Date/Time   PHART 7.367 12/31/2012 1814   HCO3 23.3  12/31/2012 1814   TCO2 26 01/01/2013 1654   ACIDBASEDEF 2.0 12/31/2012 1814   O2SAT 95.0 12/31/2012 1814   CBG (last 3)   Recent Labs  01/02/13 01/02/13 0356 01/02/13 0707  GLUCAP 116* 120* 104*    Assessment/Plan: S/P Procedure(s) (LRB): CORONARY ARTERY BYPASS GRAFTING times four using left internal mammary and right saphenous vein (N/A) INTRAOPERATIVE TRANSESOPHAGEAL ECHOCARDIOGRAM (N/A) POD # 2 CV- back on neo overnight- wean as tolerated  RESP- pulmonary hygiene  RENAL- HD yesterday- lytes OK  Plan per Nephrology  Anemia- acute on chronic- better after transfusion  OOB/ ambulate as tolerated  DC CT   LOS: 7 days    [name] [name] C 01/02/2013

## 2013-01-02 NOTE — Progress Notes (Addendum)
PM Rounds  No complaints  Still on a little bit of neo  BP 91/55  Pulse 90  Temp(Src) 97.7 F (36.5 C) (Oral)  Resp 15  Ht 6\' 1"  (1.854 m)  Wt 204 lb 2.3 oz (92.6 kg)  BMI 26.94 kg/m2  SpO2 96%   Intake/Output Summary (Last 24 hours) at 01/02/13 1751 Last data filed at 01/02/13 1700  Gross per 24 hour  Intake 1157.5 ml  Output    125 ml  Net 1032.5 ml    Stable day, has ambulated twice  For HD again Saturday

## 2013-01-02 NOTE — Progress Notes (Signed)
Subjective: Interval History: has complaints weak, but better.  Objective: Vital signs in last 24 hours: Temp:  [97.3 F (36.3 C)-99.1 F (37.3 C)] 98.5 F (36.9 C) (12/25 0400) Pulse Rate:  [60-96] 90 (12/25 0600) Resp:  [15-32] 18 (12/25 0600) BP: (76-129)/(31-75) 85/48 mmHg (12/25 0600) SpO2:  [91 %-100 %] 91 % (12/25 0600) Arterial Line BP: (78-134)/(34-44) 134/43 mmHg (12/24 1700) Weight:  [92.6 kg (204 lb 2.3 oz)-98 kg (216 lb 0.8 oz)] 92.6 kg (204 lb 2.3 oz) (12/25 0413) Weight change: 2.9 kg (6 lb 6.3 oz)  Intake/Output from previous day: 12/24 0701 - 12/25 0700 In: 1094.4 [P.O.:150; I.V.:606.4; Blood:288; IV Piggyback:50] Out: 2577 [Chest Tube:75] Intake/Output this shift:    General appearance: alert, cooperative and comfortable Resp: rales bibasilar Cardio: S1, S2 normal, systolic murmur: holosystolic 2/6, blowing at apex and friction rub heard rub but softer GI: pos bs, liver down 4 cm Extremities: avf RUA Pacer leads lower chest , 1 CT on L  Lab Results:  Recent Labs  01/01/13 1645 01/01/13 1654 01/02/13 0400  WBC 11.8*  --  9.8  HGB 9.7* 10.2* 8.5*  HCT 28.1* 30.0* 24.6*  PLT 144*  --  142*   BMET:  Recent Labs  01/01/13 0445  01/01/13 1654 01/02/13 0400  NA 135  --  137 135  K 5.4*  --  4.1 4.5  CL 98  --  96 95*  CO2 21  --   --  27  GLUCOSE 132*  --  127* 129*  BUN 31*  --  13 25*  CREATININE 7.96*  < > 4.50* 6.12*  CALCIUM 9.1  --   --  9.6  < > = values in this interval not displayed.  Recent Labs  01/01/13 0445  PTH 442.9*   Iron Studies:  Recent Labs  01/01/13 0445  IRON 12*  TIBC 189*    Studies/Results: Dg Chest Portable 1 View In Am  01/01/2013   CLINICAL DATA:  CABG.  EXAM: PORTABLE CHEST - 1 VIEW  COMPARISON:  12/31/2012.  FINDINGS: Interim extubation and removal of NG tube. Left chest tube in stable position. Swan-Ganz catheter in stable position with its tip projected over the pulmonary outflow tract. Mild  bibasilar subsegmental atelectasis. No pneumothorax. Stable cardiomegaly with minimal pulmonary vascular prominence. No acute osseous abnormality.  IMPRESSION: 1. Interim extubation and removal of NG tube. Left chest tube and Swan-Ganz catheter in stable position. 2. Mild bibasilar subsegmental atelectasis. 3. Prior CABG. Stable cardiomegaly with minimal pulmonary venous congestion.   Electronically Signed   By: Marcello Moores  Register   On: 01/01/2013 07:45   Dg Chest Portable 1 View  12/31/2012   CLINICAL DATA:  Status post coronary bypass grafting  EXAM: PORTABLE CHEST - 1 VIEW  COMPARISON:  12/26/2012  FINDINGS: Postsurgical changes are now seen. A Swan-Ganz catheter is noted within the pulmonary outflow tract. An endotracheal tube is seen approximately 5 cm above the carinal. A nasogastric catheter is noted although the tip of the catheter is coned off the film. A left-sided thoracostomy catheter is seen without pneumothorax. No focal infiltrate is noted. There is suggestion of left retrocardiac atelectasis.  IMPRESSION: Postsurgical changes with suggestion of left retrocardiac atelectasis.   Electronically Signed   By: Inez Catalina M.D.   On: 12/31/2012 13:36    I have reviewed the patient's current medications.  Assessment/Plan: 1 ESRD for HD on Sat, vol ok. K  And acid/base ok 2 Anemia stable 3 HPTH 4  Gout 5 CAD S/p CABG  Doing well, plan per Dr. Roxy Horseman.   6 Obesity P EPO, check Fe, mobilize    LOS: 7 days   [name] [name] L 01/02/2013,7:14 AM

## 2013-01-03 ENCOUNTER — Inpatient Hospital Stay (HOSPITAL_COMMUNITY): Payer: Medicare Other

## 2013-01-03 LAB — TYPE AND SCREEN
ABO/RH(D): O POS
Antibody Screen: NEGATIVE
Unit division: 0
Unit division: 0
Unit division: 0
Unit division: 0

## 2013-01-03 LAB — CBC
HCT: 23.1 % — ABNORMAL LOW (ref 39.0–52.0)
MCH: 30.7 pg (ref 26.0–34.0)
MCV: 90.9 fL (ref 78.0–100.0)
Platelets: 162 10*3/uL (ref 150–400)
RDW: 13.9 % (ref 11.5–15.5)
WBC: 9.2 10*3/uL (ref 4.0–10.5)

## 2013-01-03 LAB — RENAL FUNCTION PANEL
Albumin: 2.6 g/dL — ABNORMAL LOW (ref 3.5–5.2)
BUN: 43 mg/dL — ABNORMAL HIGH (ref 6–23)
CO2: 27 mEq/L (ref 19–32)
Calcium: 9.2 mg/dL (ref 8.4–10.5)
Chloride: 94 mEq/L — ABNORMAL LOW (ref 96–112)
Creatinine, Ser: 8.23 mg/dL — ABNORMAL HIGH (ref 0.50–1.35)
Potassium: 4.3 mEq/L (ref 3.5–5.1)

## 2013-01-03 LAB — GLUCOSE, CAPILLARY
Glucose-Capillary: 90 mg/dL (ref 70–99)
Glucose-Capillary: 115 mg/dL — ABNORMAL HIGH (ref 70–99)

## 2013-01-03 LAB — PREPARE RBC (CROSSMATCH)

## 2013-01-03 MED ORDER — DIPHENHYDRAMINE HCL 25 MG PO CAPS
25.0000 mg | ORAL_CAPSULE | Freq: Once | ORAL | Status: AC
  Administered 2013-01-03: 25 mg via ORAL
  Filled 2013-01-03: qty 1

## 2013-01-03 MED ORDER — METOPROLOL TARTRATE 12.5 MG HALF TABLET
12.5000 mg | ORAL_TABLET | Freq: Two times a day (BID) | ORAL | Status: DC
  Filled 2013-01-03 (×3): qty 1

## 2013-01-03 MED ORDER — METOPROLOL TARTRATE 25 MG/10 ML ORAL SUSPENSION
12.5000 mg | Freq: Two times a day (BID) | ORAL | Status: DC
  Filled 2013-01-03 (×3): qty 5

## 2013-01-03 MED ORDER — ACETAMINOPHEN 325 MG PO TABS
650.0000 mg | ORAL_TABLET | Freq: Once | ORAL | Status: AC
  Administered 2013-01-03: 650 mg via ORAL

## 2013-01-03 NOTE — Evaluation (Signed)
Physical Therapy Evaluation Patient Details Name: [name] [name] MRN: [id number] DOB: 01-Sep-1930 Today's Date: 01/03/2013 Time: 1050-1105 PT Time Calculation (min): 15 min  PT Assessment / Plan / Recommendation History of Present Illness  Pt adm with chest pain and underwent CABG x 4.  Pt also with ESRD and on HD x 4 years.  Clinical Impression  Patient is s/p CABG surgery resulting in functional limitations due to the deficits listed below (see PT Problem List).  Patient will benefit from skilled PT to increase their independence and safety with mobility to allow discharge home with family.     PT Assessment  Patient needs continued PT services    Follow Up Recommendations  No PT follow up    Does the patient have the potential to tolerate intense rehabilitation      Barriers to Discharge        Equipment Recommendations  None recommended by PT    Recommendations for Other Services     Frequency Min 3X/week    Precautions / Restrictions Precautions Precautions: Sternal;Fall   Pertinent Vitals/Pain VSS.  BP incr after amb.      Mobility  Bed Mobility Bed Mobility: Sit to Sidelying Left Sit to Sidelying Left: 4: Min assist;HOB flat Details for Bed Mobility Assistance: Verbal cues for technique and assist to bring feet up. Transfers Transfers: Sit to Stand;Stand to Sit Sit to Stand: 4: Min assist;Without upper extremity assist;From chair/3-in-1 Stand to Sit: 4: Min assist;Without upper extremity assist;To bed Details for Transfer Assistance: Verbal cues for hand placement to follow sternal precautions - hand pt place hands on knees.  Assist to bring hips up. Ambulation/Gait Ambulation/Gait Assistance: 4: Min guard (+1 for lines/tubes) Ambulation Distance (Feet): 350 Feet Assistive device: Other (Comment) (pushing w/c) Ambulation/Gait Assistance Details: Pt with slight veering to rt with w/c. Gait Pattern: Step-through pattern;Decreased stride length Gait velocity:  decr    Exercises     PT Diagnosis: Difficulty walking;Generalized weakness  PT Problem List: Decreased strength;Decreased activity tolerance;Decreased balance;Decreased mobility;Decreased knowledge of use of DME;Decreased knowledge of precautions PT Treatment Interventions: DME instruction;Gait training;Functional mobility training;Therapeutic activities;Therapeutic exercise;Balance training;Patient/family education     PT Goals(Current goals can be found in the care plan section) Acute Rehab PT Goals Patient Stated Goal: Return home PT Goal Formulation: With patient Time For Goal Achievement: 01/10/13 Potential to Achieve Goals: Good  Visit Information  Last PT Received On: 01/03/13 Assistance Needed: +2 (for lines) History of Present Illness: Pt adm with chest pain and underwent CABG x 4.  Pt also with ESRD and on HD x 4 years.       Prior [place] expects to be discharged to:: Private residence Living Arrangements: Spouse/significant other Available Help at Discharge: Family;Available 24 hours/day Type of Home: House Home Access: Stairs to enter [employer] of Steps: 2 Entrance Stairs-Rails: None Home Layout: One level Home Equipment: Walker - 2 wheels;Bedside commode Prior Function Level of Independence: Independent Communication Communication: No difficulties    Cognition  Cognition Arousal/Alertness: Awake/alert Behavior During Therapy: WFL for tasks assessed/performed Overall Cognitive Status: Within Functional Limits for tasks assessed    Extremity/Trunk Assessment Upper Extremity Assessment Upper Extremity Assessment: Generalized weakness Lower Extremity Assessment Lower Extremity Assessment: Generalized weakness   Balance Balance Balance Assessed: Yes Static Standing Balance Static Standing - Balance Support: No upper extremity supported Static Standing - Level of Assistance: 5: Stand by assistance  End of  Session PT - End of Session Activity Tolerance: Patient tolerated  treatment well Patient left: in bed;with call bell/phone within reach;with nursing/sitter in room;with family/visitor present Nurse Communication: Mobility status  GP     Shelsie Tijerino 01/03/2013, 12:19 PM  [place]

## 2013-01-03 NOTE — Progress Notes (Signed)
EVENING ROUNDS NOTE :     [address]       [place] [address]             [phone]                 3 Days Post-Op Procedure(s) (LRB): CORONARY ARTERY BYPASS GRAFTING times four using left internal mammary and right saphenous vein (N/A) INTRAOPERATIVE TRANSESOPHAGEAL ECHOCARDIOGRAM (N/A)  Total Length of Stay:  LOS: 8 days  BP 97/48  Pulse 66  Temp(Src) 98 F (36.7 C) (Oral)  Resp 18  Ht 6\' 1"  (1.854 m)  Wt 205 lb 8 oz (93.214 kg)  BMI 27.12 kg/m2  SpO2 98%  .Intake/Output     12/25 0701 - 12/26 0700 12/26 0701 - 12/27 0700   P.O. 600 360   I.V. (mL/kg) 758.3 (8.1) 118.4 (1.3)   Blood  350   IV Piggyback     Total Intake(mL/kg) 1358.3 (14.6) 828.4 (8.9)   Other     Stool  1   Chest Tube 10    Total Output 10 1   Net +1348.3 +827.4          . sodium chloride 10 mL/hr at 01/01/13 1700  . sodium chloride 10 mL/hr at 01/01/13 2000  . sodium chloride 250 mL (01/01/13 0800)  . dexmedetomidine Stopped (12/31/12 1625)  . nitroGLYCERIN    . phenylephrine (NEO-SYNEPHRINE) Adult infusion 4 mcg/min (01/03/13 1500)     Lab Results  Component Value Date   WBC 9.2 01/03/2013   HGB 7.8* 01/03/2013   HCT 23.1* 01/03/2013   PLT 162 01/03/2013   GLUCOSE 99 01/03/2013   CHOL 170 12/27/2012   TRIG 136 12/27/2012   HDL 36* 12/27/2012   LDLCALC 107* 12/27/2012   ALT 8 01/01/2013   AST 29 01/01/2013   NA 135 01/03/2013   K 4.3 01/03/2013   CL 94* 01/03/2013   CREATININE 8.23* 01/03/2013   BUN 43* 01/03/2013   CO2 27 01/03/2013   TSH 0.914 12/26/2012   INR 1.45 12/31/2012   HGBA1C 5.5 12/30/2012   Got one unit prbs Dialysis tomorrow  Grace Isaac MD  Beeper [phone] Office [phone] 01/03/2013 3:41 PM

## 2013-01-03 NOTE — Progress Notes (Addendum)
      [address]       [place] [address]             [phone]        3 Days Post-Op Procedure(s) (LRB): CORONARY ARTERY BYPASS GRAFTING times four using left internal mammary and right saphenous vein (N/A) INTRAOPERATIVE TRANSESOPHAGEAL ECHOCARDIOGRAM (N/A)  Subjective: Patient napping in chair. He states he does not feel as weak this am as he did yesterday.  Objective: Vital signs in last 24 hours: Temp:  [97.7 F (36.5 C)-98.5 F (36.9 C)] 97.9 F (36.6 C) (12/26 0717) Pulse Rate:  [87-115] 90 (12/26 0715) Cardiac Rhythm:  [-] Atrial paced (12/26 0600) Resp:  [0-32] 20 (12/26 0715) BP: (74-137)/(30-96) 96/48 mmHg (12/26 0715) SpO2:  [90 %-100 %] 99 % (12/26 0715) Weight:  [93.214 kg (205 lb 8 oz)] 93.214 kg (205 lb 8 oz) (12/26 0500)  Pre op weight  89 kg Current Weight  01/03/13 93.214 kg (205 lb 8 oz)      Intake/Output from previous day: 12/25 0701 - 12/26 0700 In: 1358.3 [P.O.:600; I.V.:758.3] Out: 10 [Chest Tube:10]   Physical Exam:  Cardiovascular: RRR, no murmurs, gallops. Rub with chest tubes in place. Pulmonary: Slightly diminished at bases bilaterally; no rales, wheezes, or rhonchi. Abdomen: Soft, non tender, bowel sounds present. Extremities: Trace lower extremity edema. Wounds: Dressing intact, clean and dry.  RLE wound is clean and dry, no sign of infection. Neurologic: Grossly intact without focal deficits  Lab Results: CBC:  Recent Labs  01/02/13 0400 01/03/13 0425  WBC 9.8 9.2  HGB 8.5* 7.8*  HCT 24.6* 23.1*  PLT 142* 162   BMET:   Recent Labs  01/02/13 0400 01/03/13 0425  NA 135 135  K 4.5 4.3  CL 95* 94*  CO2 27 27  GLUCOSE 129* 99  BUN 25* 43*  CREATININE 6.12* 8.23*  CALCIUM 9.6 9.2    PT/INR:  Lab Results  Component Value Date   INR 1.45 12/31/2012   INR 1.12 12/30/2012   INR 1.10 12/26/2012   ABG:  INR: Will add last result for INR, ABG once components are confirmed Will add last 4  CBG results once components are confirmed  Assessment/Plan:  1. CV - SR in the 70's. Put back on Neo synephrine gttp for low blood pressure-weaning as tolerates.Lopressor 12.5 bid with parameters. Will hold this am as BP labile. 2.  Pulmonary -CXR this am appears to show no pneumothorax, bibasilar atelectasis, cardiomegaly, and small pleural effusions. Encourage incentive spirometer. 3. Volume Overload - per nephrology 4.  Acute blood loss anemia - H and H 7.8 and 23.1  Continue Aranesp and Nulecit. Questionable transfusion. 5.CKD-HD per nephrology 6.CBGs 119/111/90. Pre op HGA1C 5.5. Continue low dose Levemir to keep off insulin drip.Will stop accu checks and SS PRN 24 ours after transfer   [name],[name] MPA-C 01/03/2013,7:39 AM  Up in chair, walking in unit Dialysis tomorrow Discussed with renal,  Transfuse today dialysis tomorrow I have seen and examined [name] [name] have formulated the  above assessment  and plan.  Grace Isaac MD Beeper [phone] Office [phone] 01/03/2013 9:20 AM

## 2013-01-03 NOTE — Progress Notes (Signed)
Patient ID: [name] [name], male   DOB: 07-31-1930, 79 y.o.   MRN: [id number]   [facility] Progress Note    Subjective:   No complaints, feels good.  Low BP noted as well as low dose neo   Objective:   BP 98/67  Pulse 88  Temp(Src) 97.9 F (36.6 C) (Oral)  Resp 23  Ht 6\' 1"  (1.854 m)  Wt 93.214 kg (205 lb 8 oz)  BMI 27.12 kg/m2  SpO2 100%  Intake/Output: I/O last 3 completed shifts: In: 1715.8 [P.O.:750; I.V.:915.8; IV Piggyback:50] Out: 70 [Chest Tube:70]   Intake/Output this shift:  Total I/O In: 17.5 [I.V.:17.5] Out: -  Weight change: -4.786 kg (-10 lb 8.8 oz)  Physical Exam: Gen:WD WN AAM in NAD CVS:no rub Resp:cta with poor inspiratory effort [id number] Ext:+edema, right lower ext, RAVF +T/B  Labs: BMET  Recent Labs Lab 12/27/12 1700 12/28/12 0110 12/30/12 0050 12/31/12 0430  12/31/12 1150 12/31/12 1311 12/31/12 1800 12/31/12 1818 01/01/13 0445 01/01/13 1645 01/01/13 1654 01/02/13 0400 01/03/13 0425  NA 141 138 134* 137  < > 134* 136  --  136 135  --  137 135 135  K 4.2 3.6 3.9 4.3  < > 4.7 4.4  --  4.9 5.4*  --  4.1 4.5 4.3  CL 98 99 95* 94*  --   --   --   --  100 98  --  96 95* 94*  CO2 30 31 30 30   --   --   --   --   --  21  --   --  27 27  GLUCOSE 95 80 114* 95  < > 165* 106*  --  122* 132*  --  127* 129* 99  BUN 23 13 30* 20  --   --   --   --  25* 31*  --  13 25* 43*  CREATININE 10.03* 6.57* 8.78* 6.88*  --   --   --  7.21* 7.20* 7.96* 4.18* 4.50* 6.12* 8.23*  ALBUMIN 3.2* 2.9* 3.0*  --   --   --   --   --   --  3.3*  --   --  3.1* 2.6*  CALCIUM 9.4 8.8 10.2 10.2  --   --   --   --   --  9.1  --   --  9.6 9.2  PHOS 3.6 3.2 3.7  --   --   --   --   --   --  4.3  --   --  5.0* 4.4  < > = values in this interval not displayed. CBC  Recent Labs Lab 01/01/13 0445 01/01/13 1645 01/01/13 1654 01/02/13 0400 01/03/13 0425  WBC 9.2 11.8*  --  9.8 9.2  HGB 7.6* 9.7* 10.2* 8.5* 7.8*  HCT 22.1* 28.1* 30.0* 24.6* 23.1*  MCV 91.3  90.4  --  90.4 90.9  PLT 151 144*  --  142* 162    @IMGRELPRIORS @ Medications:    . acetaminophen  1,000 mg Oral Q6H   Or  . acetaminophen (TYLENOL) oral liquid 160 mg/5 mL  1,000 mg Per Tube Q6H  . aspirin EC  325 mg Oral Daily   Or  . aspirin  324 mg Per Tube Daily  . bisacodyl  10 mg Oral Daily   Or  . bisacodyl  10 mg Rectal Daily  . darbepoetin (ARANESP) injection - DIALYSIS  200 mcg Intravenous Q Wed-HD  . docusate sodium  200  mg Oral Daily  . enoxaparin (LOVENOX) injection  30 mg Subcutaneous Q24H  . ferric gluconate (FERRLECIT/NULECIT) IV  125 mg Intravenous Q T,Th,Sa-HD  . heparin  40 Units/kg Dialysis Once in dialysis  . insulin aspart  0-9 Units Subcutaneous TID WC  . insulin detemir  6 Units Subcutaneous Daily  . magnesium sulfate  4 g Intravenous Once  . metoprolol tartrate  12.5 mg Oral BID   Or  . metoprolol tartrate  12.5 mg Per Tube BID  . pantoprazole  40 mg Oral Daily    Dialysis Orders: TTS @ [facility]  4 hrs 94.5 kg 400/800 2K/2.25Ca AVF @ RUA Heparin 8000 U  Hectorol 7 mcg Epogen 1000 U Venofer 0  Assessment/ Plan:   1. CAD s/p CABG x4: feels better, soft BP today.   2. ESRD: cont with TTS on holiday schedule MWSat for now. HD tomorrow 3. Anemia:ABLA on ACDz: on ESA and transfuse 1 unit PRBC's today due to drop in BP and another tomorrow with HD.  Discussed CT surgery and agree with plan.  4. CKD-MBD:stable 5. Nutrition: Protein malnutrition-encourage po and supplement 6. Hypertension:Now hypotensive on neo gtt.  Would like to transfuse 2 units PRBC's with HD tomorrow if ok with primary svc.  Blanche Gallien A 01/03/2013, 9:07 AM

## 2013-01-04 LAB — GLUCOSE, CAPILLARY
Glucose-Capillary: 100 mg/dL — ABNORMAL HIGH (ref 70–99)
Glucose-Capillary: 134 mg/dL — ABNORMAL HIGH (ref 70–99)
Glucose-Capillary: 89 mg/dL (ref 70–99)

## 2013-01-04 LAB — BASIC METABOLIC PANEL
BUN: 59 mg/dL — ABNORMAL HIGH (ref 6–23)
Chloride: 94 mEq/L — ABNORMAL LOW (ref 96–112)
Creatinine, Ser: 10.18 mg/dL — ABNORMAL HIGH (ref 0.50–1.35)
GFR calc Af Amer: 5 mL/min — ABNORMAL LOW (ref >90)
GFR calc non Af Amer: 4 mL/min — ABNORMAL LOW (ref >90)
Glucose, Bld: 108 mg/dL — ABNORMAL HIGH (ref 70–99)
Potassium: 4.8 mEq/L (ref 3.5–5.1)

## 2013-01-04 LAB — CBC
HCT: 24.1 % — ABNORMAL LOW (ref 39.0–52.0)
Hemoglobin: 8.3 g/dL — ABNORMAL LOW (ref 13.0–17.0)
MCHC: 34.4 g/dL (ref 30.0–36.0)
MCV: 90.3 fL (ref 78.0–100.0)
RDW: 14.4 % (ref 11.5–15.5)
WBC: 8.4 10*3/uL (ref 4.0–10.5)

## 2013-01-04 MED ORDER — HEPARIN SODIUM (PORCINE) 1000 UNIT/ML DIALYSIS
20.0000 [IU]/kg | INTRAMUSCULAR | Status: DC | PRN
  Filled 2013-01-04: qty 2

## 2013-01-04 MED ORDER — SODIUM CHLORIDE 0.9 % IJ SOLN
3.0000 mL | Freq: Two times a day (BID) | INTRAMUSCULAR | Status: DC
  Administered 2013-01-04 – 2013-01-06 (×6): 3 mL via INTRAVENOUS

## 2013-01-04 MED ORDER — ONDANSETRON HCL 4 MG PO TABS: 4.0000 mg | ORAL_TABLET | Freq: Four times a day (QID) | ORAL | Status: DC | PRN

## 2013-01-04 MED ORDER — ONDANSETRON HCL 4 MG/2ML IJ SOLN: 4.0000 mg | Freq: Four times a day (QID) | INTRAMUSCULAR | Status: DC | PRN

## 2013-01-04 MED ORDER — BISACODYL 5 MG PO TBEC
10.0000 mg | DELAYED_RELEASE_TABLET | Freq: Every day | ORAL | Status: DC | PRN
  Administered 2013-01-04: 10 mg via ORAL
  Filled 2013-01-04: qty 2

## 2013-01-04 MED ORDER — BISACODYL 10 MG RE SUPP: 10.0000 mg | Freq: Every day | RECTAL | Status: DC | PRN

## 2013-01-04 MED ORDER — GUAIFENESIN ER 600 MG PO TB12
600.0000 mg | ORAL_TABLET | Freq: Two times a day (BID) | ORAL | Status: DC | PRN
  Filled 2013-01-04: qty 1

## 2013-01-04 MED ORDER — CALCIUM ACETATE 667 MG PO TABS: 1.0000 | ORAL_TABLET | ORAL | Status: DC

## 2013-01-04 MED ORDER — TRAMADOL HCL 50 MG PO TABS
50.0000 mg | ORAL_TABLET | Freq: Two times a day (BID) | ORAL | Status: DC
  Administered 2013-01-04 – 2013-01-07 (×7): 50 mg via ORAL
  Filled 2013-01-04 (×7): qty 1

## 2013-01-04 MED ORDER — SODIUM CHLORIDE 0.9 % IJ SOLN: 3.0000 mL | INTRAMUSCULAR | Status: DC | PRN

## 2013-01-04 MED ORDER — SODIUM CHLORIDE 0.9 % IV SOLN: 250.0000 mL | INTRAVENOUS | Status: DC | PRN

## 2013-01-04 MED ORDER — ATORVASTATIN CALCIUM 20 MG PO TABS
20.0000 mg | ORAL_TABLET | Freq: Every day | ORAL | Status: DC
  Administered 2013-01-04 – 2013-01-06 (×3): 20 mg via ORAL
  Filled 2013-01-04 (×4): qty 1

## 2013-01-04 MED ORDER — METOPROLOL TARTRATE 12.5 MG HALF TABLET
12.5000 mg | ORAL_TABLET | Freq: Two times a day (BID) | ORAL | Status: DC
  Administered 2013-01-04 – 2013-01-07 (×6): 12.5 mg via ORAL
  Filled 2013-01-04 (×7): qty 1

## 2013-01-04 MED ORDER — CALCIUM ACETATE 667 MG PO CAPS
1334.0000 mg | ORAL_CAPSULE | Freq: Three times a day (TID) | ORAL | Status: DC
  Administered 2013-01-04 – 2013-01-07 (×9): 1334 mg via ORAL
  Filled 2013-01-04 (×12): qty 2

## 2013-01-04 MED ORDER — DOCUSATE SODIUM 100 MG PO CAPS
200.0000 mg | ORAL_CAPSULE | Freq: Every day | ORAL | Status: DC
  Administered 2013-01-05 – 2013-01-06 (×2): 200 mg via ORAL
  Filled 2013-01-04 (×3): qty 2

## 2013-01-04 MED ORDER — INSULIN ASPART 100 UNIT/ML ~~LOC~~ SOLN
0.0000 [IU] | Freq: Three times a day (TID) | SUBCUTANEOUS | Status: DC
  Administered 2013-01-04: 2 [IU] via SUBCUTANEOUS

## 2013-01-04 MED ORDER — PANTOPRAZOLE SODIUM 40 MG PO TBEC
40.0000 mg | DELAYED_RELEASE_TABLET | Freq: Every day | ORAL | Status: DC
  Administered 2013-01-05 – 2013-01-07 (×3): 40 mg via ORAL
  Filled 2013-01-04 (×3): qty 1

## 2013-01-04 MED ORDER — MOVING RIGHT ALONG BOOK
Freq: Once | Status: AC
  Administered 2013-01-04: 14:00:00
  Filled 2013-01-04 (×2): qty 1

## 2013-01-04 MED ORDER — ASPIRIN EC 81 MG PO TBEC
81.0000 mg | DELAYED_RELEASE_TABLET | Freq: Every day | ORAL | Status: DC
  Administered 2013-01-04 – 2013-01-07 (×4): 81 mg via ORAL
  Filled 2013-01-04 (×4): qty 1

## 2013-01-04 NOTE — Progress Notes (Signed)
Patient ID: [name] [name], male   DOB: 1930/11/17, 79 y.o.   MRN: [id number] TCTS DAILY ICU PROGRESS NOTE                   [address]            [place] [address]          [phone]   4 Days Post-Op Procedure(s) (LRB): CORONARY ARTERY BYPASS GRAFTING times four using left internal mammary and right saphenous vein (N/A) INTRAOPERATIVE TRANSESOPHAGEAL ECHOCARDIOGRAM (N/A)  Total Length of Stay:  LOS: 9 days   Subjective: Now on dialysis, feels better, walked around unit yesterday  Objective: Vital signs in last 24 hours: Temp:  [96.8 F (36 C)-98.3 F (36.8 C)] 96.8 F (36 C) (12/27 1030) Pulse Rate:  [38-138] 83 (12/27 1100) Cardiac Rhythm:  [-] Normal sinus rhythm (12/27 0810) Resp:  [14-35] 35 (12/27 0930) BP: (83-148)/(33-76) 148/66 mmHg (12/27 1100) SpO2:  [85 %-100 %] 100 % (12/27 1000) Weight:  [206 lb 14.4 oz (93.849 kg)-210 lb 8.6 oz (95.5 kg)] 210 lb 8.6 oz (95.5 kg) (12/27 0814)  Filed Weights   01/03/13 0500 01/04/13 0455 01/04/13 0814  Weight: 205 lb 8 oz (93.214 kg) 206 lb 14.4 oz (93.849 kg) 210 lb 8.6 oz (95.5 kg)    Weight change: 1 lb 6.4 oz (0.635 kg)   Hemodynamic parameters for last 24 hours:    Intake/Output from previous day: 12/26 0701 - 12/27 0700 In: 1631.8 [P.O.:880; I.V.:401.8; Blood:350] Out: -   Intake/Output this shift: Total I/O In: 425 [Blood:425] Out: -   Current Meds: Scheduled Meds: . aspirin EC  325 mg Oral Daily   Or  . aspirin  324 mg Per Tube Daily  . bisacodyl  10 mg Oral Daily   Or  . bisacodyl  10 mg Rectal Daily  . darbepoetin (ARANESP) injection - DIALYSIS  200 mcg Intravenous Q Wed-HD  . docusate sodium  200 mg Oral Daily  . enoxaparin (LOVENOX) injection  30 mg Subcutaneous Q24H  . ferric gluconate (FERRLECIT/NULECIT) IV  125 mg Intravenous Q T,Th,Sa-HD  . insulin aspart  0-9 Units Subcutaneous TID WC  . insulin detemir  6 Units Subcutaneous Daily  . magnesium sulfate  4 g Intravenous Once   . metoprolol tartrate  12.5 mg Oral BID   Or  . metoprolol tartrate  12.5 mg Per Tube BID  . pantoprazole  40 mg Oral Daily   Continuous Infusions: . sodium chloride 10 mL/hr at 01/01/13 1700  . sodium chloride 10 mL/hr at 01/01/13 2000  . sodium chloride 250 mL (01/01/13 0800)  . dexmedetomidine Stopped (12/31/12 1625)  . nitroGLYCERIN    . phenylephrine (NEO-SYNEPHRINE) Adult infusion Stopped (01/03/13 1800)   PRN Meds:.sodium chloride, sodium chloride, feeding supplement (NEPRO CARB STEADY), heparin, heparin, lidocaine (PF), lidocaine-prilocaine, metoprolol, morphine injection, ondansetron (ZOFRAN) IV, oxyCODONE, pentafluoroprop-tetrafluoroeth  General appearance: alert and cooperative Neurologic: intact Heart: regular rate and rhythm, S1, S2 normal, no murmur, click, rub or gallop Lungs: diminished breath sounds bibasilar Abdomen: soft, non-tender; bowel sounds normal; no masses,  no organomegaly Extremities: extremities normal, atraumatic, no cyanosis or edema and Homans sign is negative, no sign of DVT  Lab Results: CBC: Recent Labs  01/03/13 0425 01/04/13 0445  WBC 9.2 8.4  HGB 7.8* 8.3*  HCT 23.1* 24.1*  PLT 162 183   BMET:  Recent Labs  01/03/13 0425 01/04/13 0445  NA 135 132*  K 4.3 4.8  CL  94* 94*  CO2 27 25  GLUCOSE 99 108*  BUN 43* 59*  CREATININE 8.23* 10.18*  CALCIUM 9.2 9.2    PT/INR: No results found for this basename: LABPROT, INR,  in the last 72 hours Radiology: Dg Chest Port 1 View  01/03/2013   CLINICAL DATA:  Evaluate after chest tube removal.  EXAM: PORTABLE CHEST - 1 VIEW  COMPARISON:  01/02/2013.  FINDINGS: Left chest tube has been removed. There may be a minimal tiny left apical pneumothorax. Moderate left pleural effusion is noted with left basilar atelectasis. Cardiomegaly with mild vascular congestion is seen. Swan Ganz introducer remains in the right IJ.  IMPRESSION: Tiny left apical pneumothorax following left chest tube removal.  Moderate left pleural effusion noted with left basilar atelectasis.   Electronically Signed   By: Rolla Flatten M.D.   On: 01/03/2013 08:08     Assessment/Plan: S/P Procedure(s) (LRB): CORONARY ARTERY BYPASS GRAFTING times four using left internal mammary and right saphenous vein (N/A) INTRAOPERATIVE TRANSESOPHAGEAL ECHOCARDIOGRAM (N/A) Mobilize Diabetes control To circle    Jahkai Yandell B 01/04/2013 11:22 AM

## 2013-01-04 NOTE — Progress Notes (Signed)
Nursing note  CV sleeve removed as ordered and per protocol, pressure held and dressing applied. Will continue to monitor patient. Khair Chasteen, Bettina Gavia RN

## 2013-01-04 NOTE — Progress Notes (Signed)
Patient ID: [name] [name], male   DOB: 1930-06-26, 79 y.o.   MRN: [id number]  [facility] Progress Note    Subjective:   Feels good.  Tolerating HD well   Objective:   BP 111/53  Pulse 75  Temp(Src) 97.3 F (36.3 C) (Oral)  Resp 27  Ht 6\' 1"  (1.854 m)  Wt 95.5 kg (210 lb 8.6 oz)  BMI 27.78 kg/m2  SpO2 100%  Intake/Output: I/O last 3 completed shifts: In: 2040.1 [P.O.:880; I.V.:810.1; Blood:350] Out: -    Intake/Output this shift:    Weight change: 0.635 kg (1 lb 6.4 oz)  Physical Exam: Gen:WD WN AAM in NAD CVS:no rub Resp:cta [id number] Ext:1+edema on left leg, none on right, AVF +T/B  Labs: BMET  Recent Labs Lab 12/30/12 0050 12/31/12 0430  12/31/12 1311  12/31/12 1818 01/01/13 0445 01/01/13 1645 01/01/13 1654 01/02/13 0400 01/03/13 0425 01/04/13 0445  NA 134* 137  < > 136  --  136 135  --  137 135 135 132*  K 3.9 4.3  < > 4.4  --  4.9 5.4*  --  4.1 4.5 4.3 4.8  CL 95* 94*  --   --   --  100 98  --  96 95* 94* 94*  CO2 30 30  --   --   --   --  21  --   --  27 27 25   GLUCOSE 114* 95  < > 106*  --  122* 132*  --  127* 129* 99 108*  BUN 30* 20  --   --   --  25* 31*  --  13 25* 43* 59*  CREATININE 8.78* 6.88*  --   --   < > 7.20* 7.96* 4.18* 4.50* 6.12* 8.23* 10.18*  ALBUMIN 3.0*  --   --   --   --   --  3.3*  --   --  3.1* 2.6*  --   CALCIUM 10.2 10.2  --   --   --   --  9.1  --   --  9.6 9.2 9.2  PHOS 3.7  --   --   --   --   --  4.3  --   --  5.0* 4.4  --   < > = values in this interval not displayed. CBC  Recent Labs Lab 01/01/13 1645 01/01/13 1654 01/02/13 0400 01/03/13 0425 01/04/13 0445  WBC 11.8*  --  9.8 9.2 8.4  HGB 9.7* 10.2* 8.5* 7.8* 8.3*  HCT 28.1* 30.0* 24.6* 23.1* 24.1*  MCV 90.4  --  90.4 90.9 90.3  PLT 144*  --  142* 162 183    @IMGRELPRIORS @ Medications:    . aspirin EC  325 mg Oral Daily   Or  . aspirin  324 mg Per Tube Daily  . bisacodyl  10 mg Oral Daily   Or  . bisacodyl  10 mg Rectal Daily  .  darbepoetin (ARANESP) injection - DIALYSIS  200 mcg Intravenous Q Wed-HD  . docusate sodium  200 mg Oral Daily  . enoxaparin (LOVENOX) injection  30 mg Subcutaneous Q24H  . ferric gluconate (FERRLECIT/NULECIT) IV  125 mg Intravenous Q T,Th,Sa-HD  . heparin  40 Units/kg Dialysis Once in dialysis  . insulin aspart  0-9 Units Subcutaneous TID WC  . insulin detemir  6 Units Subcutaneous Daily  . magnesium sulfate  4 g Intravenous Once  . metoprolol tartrate  12.5 mg Oral BID   Or  .  metoprolol tartrate  12.5 mg Per Tube BID  . pantoprazole  40 mg Oral Daily    Dialysis Orders: TTS @ [facility]  4 hrs 94.5 kg 400/800 2K/2.25Ca AVF @ [facility] Heparin 8000 U (will need a new EDW when ready for d/c as he is leaving below EDW) Hectorol 7 mcg Epogen 1000 U Venofer 0   Assessment/ Plan:   1. CAD s/p CABG x4: doing well 2. ESRD: cont with TTS on holiday schedule MWSat for now. 3. Anemia:ABLA on ACDz: on ESA and transfused 1 unit PRBC's yesterday due to drop in BP and another today with HD and follow H/H.   4. CKD-MBD:stable 5. Nutrition: Protein malnutrition-encourage po and supplement 6. Hypertension:Now hypotensive on neo gtt. Would like to transfuse 2 units PRBC's with HD tomorrow if ok with primary svc. 7.  [name] [name] A 01/04/2013, 8:33 AM

## 2013-01-04 NOTE — Progress Notes (Signed)
CARDIAC REHAB PHASE I   PRE:  Rate/Rhythm: 84 SR  BP:  Supine:   Sitting: 122/53  Standing:    SaO2: 94% RA  MODE:  Ambulation: 200 ft   POST:  Rate/Rhythm: 93 SR  BP:  Supine: 150/60 Sitting:   Standing:    SaO2: 95% RA  1316-1340- Patient tolerated ambulation well with assist x2 and pushing rolling walker, gait steady, no c/o, VSS. To bed after walk, call bell within reach. Can be x1.    Seward Carol, MS, ACSM CES

## 2013-01-05 ENCOUNTER — Inpatient Hospital Stay (HOSPITAL_COMMUNITY): Payer: Medicare Other

## 2013-01-05 LAB — BASIC METABOLIC PANEL
BUN: 38 mg/dL — ABNORMAL HIGH (ref 6–23)
CO2: 31 mEq/L (ref 19–32)
Calcium: 9.2 mg/dL (ref 8.4–10.5)
Chloride: 92 mEq/L — ABNORMAL LOW (ref 96–112)
Creatinine, Ser: 7.61 mg/dL — ABNORMAL HIGH (ref 0.50–1.35)
GFR calc Af Amer: 7 mL/min — ABNORMAL LOW (ref >90)
GFR calc non Af Amer: 6 mL/min — ABNORMAL LOW (ref >90)
Glucose, Bld: 110 mg/dL — ABNORMAL HIGH (ref 70–99)
Potassium: 3.9 mEq/L (ref 3.5–5.1)
Sodium: 134 mEq/L — ABNORMAL LOW (ref 135–145)

## 2013-01-05 LAB — CBC
HCT: 27.4 % — ABNORMAL LOW (ref 39.0–52.0)
Hemoglobin: 9.1 g/dL — ABNORMAL LOW (ref 13.0–17.0)
MCH: 30.2 pg (ref 26.0–34.0)
MCHC: 33.2 g/dL (ref 30.0–36.0)
MCV: 91 fL (ref 78.0–100.0)
Platelets: 218 10*3/uL (ref 150–400)
RBC: 3.01 MIL/uL — ABNORMAL LOW (ref 4.22–5.81)
RDW: 15.6 % — ABNORMAL HIGH (ref 11.5–15.5)
WBC: 7.7 10*3/uL (ref 4.0–10.5)

## 2013-01-05 LAB — GLUCOSE, CAPILLARY
Glucose-Capillary: 97 mg/dL (ref 70–99)
Glucose-Capillary: 116 mg/dL — ABNORMAL HIGH (ref 70–99)
Glucose-Capillary: 92 mg/dL (ref 70–99)

## 2013-01-05 LAB — TYPE AND SCREEN
ABO/RH(D): O POS
Unit division: 0

## 2013-01-05 NOTE — Progress Notes (Signed)
01/05/13 Nursing Note Patient ambulated in hallway approximately 350 feet x1 assist with walker. Gait steady back in room  Sitting on bed call bell within reach will monitor patient [name] [name], Bettina Gavia RN

## 2013-01-05 NOTE — Progress Notes (Signed)
Nursing note Patient ambulated in hallway x1 assist with walker 150 feet. Gait steady, back in chair call bell within reach. Will monitor patient. Judythe Postema, Bettina Gavia  rN

## 2013-01-05 NOTE — Progress Notes (Signed)
Patient requested a laxative at bedtime. Ducolax 10mg  po was given at 2133.  Patient had a bowel movement at 0038. Will continue to monitor.

## 2013-01-05 NOTE — Progress Notes (Signed)
Patient ambulated 191ft using rolling walker with RN on room air. Patient had a moderate pace and steady gait. Patient's vitals remained stable.  Patient returned to bed resting. Will continue to monitor.

## 2013-01-05 NOTE — Progress Notes (Signed)
Patient ID: Paiton Ziehm, male   DOB: 1930/05/09, 79 y.o.   MRN: [id number]  [facility] Progress Note    Subjective:   Out of ICU yesterday No c/o this am.  Feels well.  Ambulating and sitting in chair    Objective:   BP 136/46  Pulse 72  Temp(Src) 97.9 F (36.6 C) (Oral)  Resp 18  Ht 6\' 1"  (1.854 m)  Wt 92.08 kg (203 lb)  BMI 26.79 kg/m2  SpO2 99%  Intake/Output: I/O last 3 completed shifts: In: 781.5 [P.O.:120; I.V.:236.5; Blood:425] Out: [id number] [Other:1500; Stool:2]  Physical Exam: Gen:WD WN in NAD CVS:no rub.  RRR. Nl s1s2 Resp:ctab Abd: s/nt/nd Ext: no LEE, AVF +T/B NEURO: nonfocal PSYCH: nl mood/affect  Labs: BMET  Recent Labs Lab 12/30/12 0050 12/31/12 0430  12/31/12 1818 01/01/13 0445 01/01/13 1645 01/01/13 1654 01/02/13 0400 01/03/13 0425 01/04/13 0445 01/05/13 0610  NA 134* 137  < > 136 135  --  137 135 135 132* 134*  K 3.9 4.3  < > 4.9 5.4*  --  4.1 4.5 4.3 4.8 3.9  CL 95* 94*  --  100 98  --  96 95* 94* 94* 92*  CO2 30 30  --   --  21  --   --  27 27 25 31   GLUCOSE 114* 95  < > 122* 132*  --  127* 129* 99 108* 110*  BUN 30* 20  --  25* 31*  --  13 25* 43* 59* 38*  CREATININE 8.78* 6.88*  < > 7.20* 7.96* 4.18* 4.50* 6.12* 8.23* 10.18* 7.61*  ALBUMIN 3.0*  --   --   --  3.3*  --   --  3.1* 2.6*  --   --   CALCIUM 10.2 10.2  --   --  9.1  --   --  9.6 9.2 9.2 9.2  PHOS 3.7  --   --   --  4.3  --   --  5.0* 4.4  --   --   < > = values in this interval not displayed. CBC  Recent Labs Lab 01/02/13 0400 01/03/13 0425 01/04/13 0445 01/05/13 0610  WBC 9.8 9.2 8.4 7.7  HGB 8.5* 7.8* 8.3* 9.1*  HCT 24.6* 23.1* 24.1* 27.4*  MCV 90.4 90.9 90.3 91.0  PLT 142* 162 183 218     Medications:    . aspirin EC  81 mg Oral Daily  . atorvastatin  20 mg Oral q1800  . calcium acetate  1,334 mg Oral TID WC  . darbepoetin (ARANESP) injection - DIALYSIS  200 mcg Intravenous Q Wed-HD  . docusate sodium  200 mg Oral Daily  . enoxaparin  (LOVENOX) injection  30 mg Subcutaneous Q24H  . ferric gluconate (FERRLECIT/NULECIT) IV  125 mg Intravenous Q T,Th,Sa-HD  . insulin aspart  0-24 Units Subcutaneous TID AC & HS  . metoprolol tartrate  12.5 mg Oral BID  . pantoprazole  40 mg Oral QAC breakfast  . sodium chloride  3 mL Intravenous Q12H  . traMADol  50 mg Oral Q12H    Dialysis Orders: TTS @ [facility]  4 hrs 94.5 kg 400/800 2K/2.25Ca AVF @ [facility] Heparin 8000 U (will need a new EDW when ready for d/c as he is leaving below EDW) Hectorol 7 mcg Epogen 1000 U Venofer 0   Assessment/ Plan:   1. CAD s/p CABG x4: doing well.  Potential d/c tomorrow? 2. ESRD: cont with TTS on holiday schedule MWSat for  now. 3. Anemia: Hb stable today.  Follow.  Las transfused 12/27 4. CKD-MBD:stable 5. Nutrition: Protein malnutrition-encourage po and supplement 6. Hypertension:  Stable now.  Only on low dose MTP.    Pearson Grippe, B 01/05/2013, 9:07 AM

## 2013-01-05 NOTE — Progress Notes (Addendum)
[address]       [place] [address]             [phone]      5 Days Post-Op  Procedure(s) (LRB): CORONARY ARTERY BYPASS GRAFTING times four using left internal mammary and right saphenous vein (N/A) INTRAOPERATIVE TRANSESOPHAGEAL ECHOCARDIOGRAM (N/A) Subjective: conts to feel stronger  Objective  Telemetry sinus rhythm  Temp:  [96.8 F (36 C)-98.6 F (37 C)] 97.9 F (36.6 C) (12/28 0405) Pulse Rate:  [67-83] 72 (12/28 0405) Resp:  [18-35] 18 (12/28 0405) BP: (97-148)/(43-66) 136/46 mmHg (12/28 0405) SpO2:  [94 %-100 %] 99 % (12/28 0405) Weight:  [203 lb (92.08 kg)-210 lb 8.6 oz (95.5 kg)] 203 lb (92.08 kg) (12/28 0405)   Intake/Output Summary (Last 24 hours) at 01/05/13 0759 Last data filed at 01/05/13 0357  Gross per 24 hour  Intake    428 ml  Output   1502 ml  Net  -1074 ml       General appearance: alert, cooperative and no distress Heart: regular rate and rhythm Lungs: mildly dim in bases Abdomen: benign Extremities: + RLE edema, minor Wound: incis healing well  Lab Results:  Recent Labs  01/03/13 0425 01/04/13 0445 01/05/13 0610  NA 135 132* 134*  K 4.3 4.8 3.9  CL 94* 94* 92*  CO2 27 25 31   GLUCOSE 99 108* 110*  BUN 43* 59* 38*  CREATININE 8.23* 10.18* 7.61*  CALCIUM 9.2 9.2 9.2  PHOS 4.4  --   --     Recent Labs  01/03/13 0425  ALBUMIN 2.6*   No results found for this basename: LIPASE, AMYLASE,  in the last 72 hours  Recent Labs  01/04/13 0445 01/05/13 0610  WBC 8.4 7.7  HGB 8.3* 9.1*  HCT 24.1* 27.4*  MCV 90.3 91.0  PLT 183 218   No results found for this basename: CKTOTAL, CKMB, TROPONINI,  in the last 72 hours No components found with this basename: POCBNP,  No results found for this basename: DDIMER,  in the last 72 hours No results found for this basename: HGBA1C,  in the last 72 hours No results found for this basename: CHOL, HDL, LDLCALC, TRIG, CHOLHDL,  in the last 72 hours No results  found for this basename: TSH, T4TOTAL, FREET3, T3FREE, THYROIDAB,  in the last 72 hours No results found for this basename: VITAMINB12, FOLATE, FERRITIN, TIBC, IRON, RETICCTPCT,  in the last 72 hours  Medications: Scheduled . aspirin EC  81 mg Oral Daily  . atorvastatin  20 mg Oral q1800  . calcium acetate  1,334 mg Oral TID WC  . darbepoetin (ARANESP) injection - DIALYSIS  200 mcg Intravenous Q Wed-HD  . docusate sodium  200 mg Oral Daily  . enoxaparin (LOVENOX) injection  30 mg Subcutaneous Q24H  . ferric gluconate (FERRLECIT/NULECIT) IV  125 mg Intravenous Q T,Th,Sa-HD  . insulin aspart  0-24 Units Subcutaneous TID AC & HS  . insulin detemir  6 Units Subcutaneous Daily  . metoprolol tartrate  12.5 mg Oral BID  . pantoprazole  40 mg Oral QAC breakfast  . sodium chloride  3 mL Intravenous Q12H  . traMADol  50 mg Oral Q12H     Radiology/Studies:  Dg Chest 2 View  01/05/2013   CLINICAL DATA:  Status post CABG.  EXAM: CHEST - 2 VIEW  COMPARISON:  01/03/2013  FINDINGS: Jugular sheath has been removed. Lungs show bibasilar atelectasis. Small bilateral pleural effusions.  There is no evidence of pneumothorax, pulmonary edema or significant airspace consolidation. The heart size and mediastinal contours are stable.  IMPRESSION: Bibasilar atelectasis and small bilateral pleural effusions. No pneumothorax.   Electronically Signed   By: Aletta Edouard M.D.   On: 01/05/2013 07:14    INR: Will add last result for INR, ABG once components are confirmed Will add last 4 CBG results once components are confirmed  Assessment/Plan: S/P Procedure(s) (LRB): CORONARY ARTERY BYPASS GRAFTING times four using left internal mammary and right saphenous vein (N/A) INTRAOPERATIVE TRANSESOPHAGEAL ECHOCARDIOGRAM (N/A)  1 conts to make excellent progress in recovery 2 H/H improved 3 renal managing dialysis/CKD 4 push rehab/pulm toilet- routine 5 d/c levimir insulin- sugars controlled    LOS: 10 days     [name],[name] 12/28/20147:59 AM  Poss home tomorrow I have seen and examined [name] [name] and agree with the above assessment  and plan.  Grace Isaac MD Beeper [phone] Office [phone] 01/05/2013 10:25 AM

## 2013-01-05 NOTE — Progress Notes (Signed)
Nursing note Patient up to chairx1, assist x1, call bell within reach will continue to monitor patient. Kennet Mccort, Bettina Gavia RN

## 2013-01-06 DIAGNOSIS — Z951 Presence of aortocoronary bypass graft: Secondary | ICD-10-CM

## 2013-01-06 LAB — GLUCOSE, CAPILLARY: Glucose-Capillary: 99 mg/dL (ref 70–99)

## 2013-01-06 NOTE — Progress Notes (Addendum)
      [address]       [place] [address]             [phone]      6 Days Post-Op Procedure(s) (LRB): CORONARY ARTERY BYPASS GRAFTING times four using left internal mammary and right saphenous vein (N/A) INTRAOPERATIVE TRANSESOPHAGEAL ECHOCARDIOGRAM (N/A)  Subjective:  [name] [name] has no complaints this morning.  He is scheduled to have dialysis.  Ready to go home.  Objective: Vital signs in last 24 hours: Temp:  [97.4 F (36.3 C)-98.1 F (36.7 C)] 97.4 F (36.3 C) (12/29 0554) Pulse Rate:  [65-72] 65 (12/29 0554) Cardiac Rhythm:  [-] Normal sinus rhythm (12/28 1936) Resp:  [18-20] 18 (12/29 0554) BP: (112-137)/(45-53) 112/45 mmHg (12/29 0554) SpO2:  [94 %-97 %] 94 % (12/29 0554) Weight:  [206 lb 1.6 oz (93.486 kg)] 206 lb 1.6 oz (93.486 kg) (12/29 0554)  Intake/Output from previous day: 12/28 0701 - 12/29 0700 In: 600 [P.O.:600] Out: -   General appearance: alert, cooperative and no distress Heart: regular rate and rhythm Lungs: clear to auscultation bilaterally Abdomen: soft, non-tender; bowel sounds normal; no masses,  no organomegaly Extremities: edema tgrace Wound: clean and dry  Lab Results:  Recent Labs  01/04/13 0445 01/05/13 0610  WBC 8.4 7.7  HGB 8.3* 9.1*  HCT 24.1* 27.4*  PLT 183 218   BMET:  Recent Labs  01/04/13 0445 01/05/13 0610  NA 132* 134*  K 4.8 3.9  CL 94* 92*  CO2 25 31  GLUCOSE 108* 110*  BUN 59* 38*  CREATININE 10.18* 7.61*  CALCIUM 9.2 9.2    PT/INR: No results found for this basename: LABPROT, INR,  in the last 72 hours ABG    Component Value Date/Time   PHART 7.367 12/31/2012 1814   HCO3 23.3 12/31/2012 1814   TCO2 26 01/01/2013 1654   ACIDBASEDEF 2.0 12/31/2012 1814   O2SAT 95.0 12/31/2012 1814   CBG (last 3)   Recent Labs  01/05/13 1630 01/05/13 2139 01/06/13 0617  GLUCAP 116* 92 99    Assessment/Plan: S/P Procedure(s) (LRB): CORONARY ARTERY BYPASS GRAFTING times four using left  internal mammary and right saphenous vein (N/A) INTRAOPERATIVE TRANSESOPHAGEAL ECHOCARDIOGRAM (N/A)  1. CV- NSR brady, good pressure control- continue Lopressor 2. Pulm- off oxygen, no issues continue IS 3. Renal- ESRD, Nephrology following, per patient dialysis today 4. CBGs controlled- patient not a diabetic will d/c glucose checks 5. Dispo- patient stable, d/c EPW, home after dialysis vs AM   LOS: 11 days    Ellwood Handler 01/06/2013  Plan d/c home today after dialysis I have seen and examined [name] [name] and agree with the above assessment  and plan.  Grace Isaac MD Beeper [phone] Office [phone] 01/06/2013 9:43 AM

## 2013-01-06 NOTE — Progress Notes (Signed)
[id number] Cardiac Rehab Completed discharge education with pt. He voices understanding. Placed post-op [facility] video on for pt to watch. Deon Pilling, RN 01/06/2013 2:43 PM

## 2013-01-06 NOTE — Progress Notes (Addendum)
[id number] Cardiac Rehab Completed discharge education with pt. I was rushed doing his education due to dialysis coming to pick him up. Pt agrees to [place]. CRP in [place], will send referral. I will try to return when he returns from dialysis and when his wife gets here to see if she has any questions. Deon Pilling, RN 01/06/2013 8:43 AM

## 2013-01-06 NOTE — Progress Notes (Signed)
Patient does not feel well after HD-very fatigued. Will keep until am and if feeling better, discharge in am.

## 2013-01-06 NOTE — Discharge Summary (Signed)
Physician Discharge Summary  Patient ID: [name] [name] MRN: [id number] DOB/AGE: 77-Aug-1932 77 y.o.  Admit date: 12/26/2012 Discharge date: 01/06/2013  Admission Diagnoses:  Patient Active Problem List   Diagnosis Date Noted  . End-stage renal disease (ESRD) 12/30/2012  . Secondary hyperparathyroidism 12/30/2012  . Hypotension 06/16/2011  . Palpitations 06/16/2011  . MYOCARDIAL INFARCTION, ACUTE, NON-Q WAVE 04/26/2009  . DYSPNEA ON EXERTION 03/04/2009  . DYSLIPIDEMIA 03/03/2009  . GOUT 03/03/2009  . OBESITY 03/03/2009  . ANEMIA 03/03/2009  . HYPERTENSION 03/03/2009   Discharge Diagnoses:   Patient Active Problem List   Diagnosis Date Noted  . 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 End-stage renal disease (ESRD) 12/30/2012  . Secondary hyperparathyroidism 12/30/2012  . Hypotension 06/16/2011  . Palpitations 06/16/2011  . MYOCARDIAL INFARCTION, ACUTE, NON-Q WAVE 04/26/2009  . DYSPNEA ON EXERTION 03/04/2009  . DYSLIPIDEMIA 03/03/2009  . GOUT 03/03/2009  . OBESITY 03/03/2009  . ANEMIA 03/03/2009  . HYPERTENSION 03/03/2009   Discharged Condition: good  History of Present Illness:   [name] is an 79 yo African American male with known history of Hypertension, Hyperlipidemia, Anemia, ESRD on Dialysis, and CAD S/P NSTEMI from 2011.  The patient was brought to the Emergency Department by EMS after developing chest pain, shortness of breath, and diaphoresis during dialysis on 12/18.  The patient states that these symptoms have been occurring for the past 2 months.  He presented to his PCP who prescribed the patient a "brown capsule" which did no relieve his symptoms.  Work up was positive for a NSTEMI and patient was admitted for further care.    Hospital Course:   The patient was originally taken for cardiac catheterization on 12/27/2012.  However the patient was complaining of shortness of breath and he was unable to lay flat for the procedure.  He was treated with further dialysis and was  again taken for cardiac catheterization which revealed 3 vessel CAD with a reduced EF of 45%.   It was felt the patient would best be treated with Coronary Bypass Grafting and TCTS was consulted.  The patient was evaluated by Dr. Servando Snare on 12/30/2012 at which time he was in agreement the patient would benefit from surgical intervention.  The risks and benefits of the procedure were explained to the patient and he was agreeable to proceed.  He was taken to the operating room on 12/31/2012.  He underwent CABG x 4 utilizing LIMA to LAD, SVG to Ramus Intermediate, SVG to PDA, and SVG to OM.  He also underwent endoscopic saphenous vein harvest from his right leg.  He tolerated the procedure well and was taken to the SICU in stable condition. The patient was extubated the evening of surgery.  During his stay in the ICU he resumed his dialysis regimen which was closely monitored by Nephrology.  He was weaned off Neo Synephrine and Dopamine drips as tolerated.  He received packed red cells for expected blood loss anemia.  His chest tubes and arterial lines were removed without difficulty.  The patient was ambulating around the unit and was transferred to the step down unit in stable condition.  The patient continues to progress.  He has been maintaining NSR and his pacing wires have been removed.  The patient again required transfusion during dialysis for post operative anemia.  He is ambulating without difficulty.  He is tolerating a renal diet.  He is medically stable at this time.  Should no further issues arise we anticipate discharge home in the  next 24-48 hours.  He will resume his outpatient dialysis regimen.  He will follow up with Dr. Servando Snare on 01/30/2013 with a CXR prior to his appointment.  He will also need to follow up with Cardiology in 2-4 weeks.    Consults: nephrology  Significant Diagnostic Studies: angiography:   Hemodynamic Findings:  Central aortic pressure: 148/51  Left ventricular pressure:  145/7/14  Angiographic Findings:  Left main: Normal caliber. Mild plaque.  Left Anterior Descending Artery: Large caliber vessel that courses to the apex. The proximal vessel has diffuse 40% stenosis. The mid vessel has diffuse 80% stenosis. The distal vessel has diffuse 50% stenosis. The first diagonal branch is moderate in caliber with 90% ostial stenosis, proximal 90% stenosis. The second diagonal branch is moderate in caliber with mild plaque disease.  Circumflex Artery: Large caliber vessel with termination into a large obtuse marginal branch. There is a 99% mid stenosis leading into the obtuse marginal branch. The proximal segment of the obtuse marginal branch has a 70% stenosis.  Right Coronary Artery: Large dominant vessel with 70% proximal stenosis, 100% mid stenosis. The distal vessel is seen to fill from left to right collaterals.  Left Ventricular Angiogram: LVEF=45% with inferior wall hypokinesis.   Treatments: surgery:   Urgent coronary artery bypass grafting x4 with left internal mammary to the left anterior descending coronary artery,  reverse saphenous vein graft to the intermediate, reverse saphenous vein graft to the obtuse marginal, reverse saphenous vein graft to the posterior descending coronary artery with right thigh and calf endo veins harvesting.  Disposition: Home  Discharge Medications:    Medication List    STOP taking these medications       Calcium Acetate 667 MG Tabs      TAKE these medications       aspirin 81 MG EC tablet  Take 1 tablet (81 mg total) by mouth daily.     atorvastatin 20 MG tablet  Commonly known as:  LIPITOR  Take 1 tablet (20 mg total) by mouth daily at 6 PM.     calcium acetate 667 MG capsule  Commonly known as:  PHOSLO  Take 2 capsules (1,334 mg total) by mouth 3 (three) times daily with meals.     darbepoetin 200 MCG/0.4ML Soln injection  Commonly known as:  ARANESP  Inject 0.4 mLs (200 mcg total) into the vein every  Wednesday with hemodialysis.     metoprolol tartrate 12.5 mg Tabs tablet  Commonly known as:  LOPRESSOR  Take 0.5 tablets (12.5 mg total) by mouth 2 (two) times daily.     omeprazole 20 MG capsule  Commonly known as:  PRILOSEC  Take 20 mg by mouth 2 (two) times daily.     sodium chloride 0.9 % SOLN 100 mL with ferric gluconate 12.5 MG/ML SOLN 125 mg  Inject 125 mg into the vein Every Tuesday,Thursday,and Saturday with dialysis.     traMADol 50 MG tablet  Commonly known as:  ULTRAM  Take 1 tablet (50 mg total) by mouth every 12 (twelve) hours as needed.       The patient has been discharged on:   1.Beta Blocker:  Yes [x   ]                              No   [   ]  If No, reason:  2.Ace Inhibitor/ARB: Yes [   ]                                     No  [  x  ]                                     If No, reason: Labile Blood pressure, ESRD  3.Statin:   Yes [  x ]                  No  [   ]                  If No, reason:  4.Ecasa:  Yes  [  x ]                  No   [   ]                  If No, reason:     Discharge Orders   Future Appointments Provider Department Dept Phone   01/30/2013 10:00 AM Grace Isaac, MD [facility] [phone]   Future Orders Complete By Expires   Amb Referral to Cardiac Rehabilitation  As directed      Follow-up Information   Follow up with Grace Isaac, MD On 01/30/2013. (Appointment is at 10:00)    Specialty:  Cardiothoracic Surgery   Contact information:   [facility] [facility] [address] [phone]       Follow up with [facility] On 01/30/2013. (Please get CXR 1 hour prior to appointment with Dr. Servando Snare)    Contact information:          Follow up with Richardson Dopp, PA-C On 01/22/2013. (Appointment is at 8:30)    Specialty:  Physician Assistant   Contact information:   [place]. [address] [place]  [address] [phone]       Signed: Ellwood Handler 01/06/2013, 11:20 AM

## 2013-01-06 NOTE — Progress Notes (Signed)
PT Cancel Note:   Pt currently in HD.    Sarajane Marek, [place] [phone] 01/06/2013

## 2013-01-06 NOTE — Procedures (Signed)
I was present at this dialysis session, have reviewed the session itself and made  appropriate changes  Kelly Splinter MD (pgr) [phone]    [phone] 01/06/2013, 9:32 AM

## 2013-01-06 NOTE — Progress Notes (Signed)
01/06/13 Epw pulled per protocol and as ordered. All ends intact, pt tolerated well, bp  118/48 patient reminded to lie supine approximately one hour. Genevieve Ritzel, Bettina Gavia  rN

## 2013-01-06 NOTE — Op Note (Signed)
NAME:  [name] [name], [name] [name] NO.:  [id number]  MEDICAL RECORD NO.:  [id number]  LOCATION:  [id number]                        FACILITY:  [facility]  PHYSICIAN:  Lanelle Bal, MD    DATE OF BIRTH:  1930/09/30  DATE OF PROCEDURE:  12/31/2012 DATE OF DISCHARGE:                              OPERATIVE REPORT   PREOPERATIVE DIAGNOSIS:  New onset of angina with a severe three-vessel coronary artery disease with non-ST elevation myocardial infarction, and chronic dialysis.  POSTOPERATIVE DIAGNOSIS:  New onset of angina with a severe three-vessel coronary artery disease with non-ST elevation myocardial infarction, and chronic dialysis.  SURGICAL PROCEDURE:  Urgent coronary artery bypass grafting x4 with left internal mammary to the left anterior descending coronary artery, reverse saphenous vein graft to the intermediate, reverse saphenous vein graft to the obtuse marginal, reverse saphenous vein graft to the posterior descending coronary artery with right thigh and calf endo veins harvesting.  SURGEON:  Lanelle Bal, MD  FIRST ASSISTANT:  John Giovanni, PA  BRIEF HISTORY:  The patient is an 79 year old male, who has been on chronic hemodialysis the day prior to surgery presented with new onset of angina and positive cardiac enzymes consistent with non-ST elevation myocardial infarction.  Cardiac catheterization was performed which demonstrated severe three-vessel coronary artery disease which was not favorable for angioplasty.  The risks and options of surgery were discussed with the patient in detail and he was willing to proceed. Risks of death, infection, stroke, myocardial infarction, bleeding, blood transfusion were all discussed with him in detail.  He is aware that because of his age and chronic renal failure, and he was at increased risk for surgery.  DESCRIPTION OF PROCEDURE:  With Swan-Ganz and arterial line monitors in place, the patient underwent general  endotracheal anesthesia without incidence.  Skin of the chest and legs were prepped with Betadine and draped in usual sterile manner.  Using the [employer] endo vein harvesting system, vein was harvested from the right thigh and calf and was of adequate caliber and quality.  Median sternotomy was performed.  Left internal mammary artery was dissected down as pedicle graft.  The distal artery was divided, had good free flow.  Pericardium was opened. Overall ventricular function appeared preserved.  The patient was systemically heparinized.  Ascending aorta was cannulated.  The right atrium was cannulated.  An aortic root vent cardioplegia needle was introduced into the ascending aorta.  The patient was placed on cardiopulmonary bypass 2.4 liters/minute/meter squared.  The heart was elevated and the intermediate and circ, obtuse marginal vessels were both identified both were intramyocardial but dissected out of their position identified.  Aortic cross-clamp was applied, 500 mL of cold blood potassium cardioplegia was administered with diastolic arrest of the heart.  The heart was then elevated and the intermediate coronary artery was opened.  Using a running 7-0 Prolene, distal anastomosis was performed in a similar fashion.  The obtuse marginal vessel was opened, admitted an 1.5-mm probe distally.  Using a running 7-0 Prolene, distal anastomosis was performed.  Attention was then turned to the posterior descending coronary artery which was small but did open and did admit an 1-mm probe distally.  Using a running 7-0 Prolene, distal anastomosis was performed.  Additional cold blood cardioplegia was administered down the vein grafts.  Attention was then turned to the left anterior descending coronary artery which was a reasonable size vessel admitting an 1.5-mm probe distally.  Using a running 8-0 Prolene, left internal mammary artery was anastomosed to left anterior descending  coronary artery.  With release of the bulldog on the mammary artery, there was rise in myocardial septal temperature.  Bulldog was placed back on the mammary artery.  Additional cold blood cardioplegia was administered down the vein grafts.  With crossclamp still in place, 3 punch aortotomies were performed.  Each of the 3 vein grafts were anastomosed to the ascending aorta.  Air was evacuated from the ascending aorta and the aortic cross-clamp was removed with total cross-clamp time of 85 minutes.  Prior to removal of the crossclamp, the bulldog on the mammary artery was removed with prompt rise in myocardial septal temperature. The patient converted spontaneously to a sinus rhythm.  He did require atrially pacing to increase his rate.  Sites of the anastomosis were inspected and free of bleeding.  The patient was then ventilated and weaned from cardiopulmonary bypass without difficulty.  He remained hemodynamically stable.  He was decannulated in usual fashion. Protamine sulfate was administered with operative field hemostatic.  A left pleural tube and a Blake mediastinal drain left in place.  Sternum was closed with #6 stainless steel wire.  Fascia was closed with interrupted 0 Vicryl and 3-0 Vicryl in the subcutaneous tissue, and 4-0 subcuticular stitch in skin edges.  Dry dressings were applied.  Sponge and needle count was reported as correct at the completion of the procedure.  The patient tolerated the procedure without obvious complication.  He was transferred to Surgical Intensive Care Unit for further postop care.  Total crossclamp time was 85 minutes.  Total pump time was 104 minutes.     Lanelle Bal, MD     EG/MEDQ  D:  01/05/2013  T:  01/06/2013  Job:  [id number]

## 2013-01-06 NOTE — Progress Notes (Signed)
Patient ID: Sudeys Pruner, male   DOB: 1930/01/13, 79 y.o.   MRN: [id number]  [facility] Progress Note    Subjective:   On dialysis, no complaints    Objective:   BP 104/49  Pulse 65  Temp(Src) 97.4 F (36.3 C) (Oral)  Resp 18  Ht 6\' 1"  (1.854 m)  Wt 93.3 kg (205 lb 11 oz)  BMI 27.14 kg/m2  SpO2 98%  Intake/Output: I/O last 3 completed shifts: In: 600 [P.O.:600] Out: 2 [Stool:2]  Physical Exam: Gen:WD WN in NAD CVS:no rub.  RRR. Nl s1s2 Resp:ctab Abd: s/nt/nd Ext: no LEE, AVF +T/B NEURO: nonfocal PSYCH: nl mood/affect  Labs: BMET  Recent Labs Lab 12/31/12 0430  12/31/12 1818 01/01/13 0445 01/01/13 1645 01/01/13 1654 01/02/13 0400 01/03/13 0425 01/04/13 0445 01/05/13 0610  NA 137  < > 136 135  --  137 135 135 132* 134*  K 4.3  < > 4.9 5.4*  --  4.1 4.5 4.3 4.8 3.9  CL 94*  --  100 98  --  96 95* 94* 94* 92*  CO2 30  --   --  21  --   --  27 27 25 31   GLUCOSE 95  < > 122* 132*  --  127* 129* 99 108* 110*  BUN 20  --  25* 31*  --  13 25* 43* 59* 38*  CREATININE 6.88*  < > 7.20* 7.96* 4.18* 4.50* 6.12* 8.23* 10.18* 7.61*  ALBUMIN  --   --   --  3.3*  --   --  3.1* 2.6*  --   --   CALCIUM 10.2  --   --  9.1  --   --  9.6 9.2 9.2 9.2  PHOS  --   --   --  4.3  --   --  5.0* 4.4  --   --   < > = values in this interval not displayed. CBC  Recent Labs Lab 01/02/13 0400 01/03/13 0425 01/04/13 0445 01/05/13 0610  WBC 9.8 9.2 8.4 7.7  HGB 8.5* 7.8* 8.3* 9.1*  HCT 24.6* 23.1* 24.1* 27.4*  MCV 90.4 90.9 90.3 91.0  PLT 142* 162 183 218     Medications:    . aspirin EC  81 mg Oral Daily  . atorvastatin  20 mg Oral q1800  . calcium acetate  1,334 mg Oral TID WC  . darbepoetin (ARANESP) injection - DIALYSIS  200 mcg Intravenous Q Wed-HD  . docusate sodium  200 mg Oral Daily  . enoxaparin (LOVENOX) injection  30 mg Subcutaneous Q24H  . ferric gluconate (FERRLECIT/NULECIT) IV  125 mg Intravenous Q T,Th,Sa-HD  . metoprolol tartrate  12.5 mg  Oral BID  . pantoprazole  40 mg Oral QAC breakfast  . sodium chloride  3 mL Intravenous Q12H  . traMADol  50 mg Oral Q12H    Dialysis Orders: TTS @ [facility]  4 hrs 94.5 kg 400/800 2K/2.25Ca AVF @ [facility] Heparin 8000 U (will need a new EDW when ready for d/c as he is leaving below EDW) Hectorol 7 mcg Epogen 1000 U Venofer 0   Assessment/ Plan:   1. CAD s/p CABG x4: doing well, dispo per primary 2. ESRD: HD today 3. Anemia: Hb stable, last transfused 12/27 4. CKD-MBD:stable 5. Hypertension:  Stable now.  Only low dose MTP.  Lowering dry wt at discharge (92-93kg)  [place] D 01/06/2013, 9:48 AM

## 2013-01-07 MED ORDER — CALCIUM ACETATE 667 MG PO CAPS: 1334.0000 mg | ORAL_CAPSULE | Freq: Three times a day (TID) | ORAL | Status: DC

## 2013-01-07 MED ORDER — DARBEPOETIN ALFA-POLYSORBATE 200 MCG/0.4ML IJ SOLN: 200.0000 ug | INTRAMUSCULAR | Status: DC

## 2013-01-07 MED ORDER — SODIUM CHLORIDE 0.9 % IV SOLN: 125.0000 mg | INTRAVENOUS | Status: DC

## 2013-01-07 MED ORDER — METOPROLOL TARTRATE 12.5 MG HALF TABLET: 12.5000 mg | ORAL_TABLET | Freq: Two times a day (BID) | ORAL | Status: DC

## 2013-01-07 MED ORDER — ATORVASTATIN CALCIUM 20 MG PO TABS: 20.0000 mg | ORAL_TABLET | Freq: Every day | ORAL | Status: DC

## 2013-01-07 MED ORDER — ASPIRIN 81 MG PO TBEC: 81.0000 mg | DELAYED_RELEASE_TABLET | Freq: Every day | ORAL | Status: DC

## 2013-01-07 MED ORDER — TRAMADOL HCL 50 MG PO TABS: 50.0000 mg | ORAL_TABLET | Freq: Two times a day (BID) | ORAL | Status: DC | PRN

## 2013-01-07 NOTE — Progress Notes (Signed)
Patient refused to walk,complains of fatigue after HD today.

## 2013-01-07 NOTE — Progress Notes (Signed)
12/230/14 Patient given discharge instructions, AVS and paper prescriptions with follow up appointments. All questions were answered. Will discharge home as ordered. Lluvia Gwynne, Bettina Gavia RN

## 2013-01-07 NOTE — Progress Notes (Signed)
CARDIAC REHAB PHASE I   PRE:  Rate/Rhythm: 70 SR  BP:  Supine:   Sitting: 122/74  Standing:    SaO2: 98 RA  MODE:  Ambulation: 350 ft   POST:  Rate/Rhythm: 85 SR  BP:  Supine:   Sitting: 141/54  Standing:    SaO2: 96 RA [id number] Pt tolerated ambulation well without c/o. Gait steady with walker. VS stable Pt back to side of bed after walk with call light in reach and wife present.  Rodney Langton RN 01/07/2013 9:58 AM

## 2013-01-07 NOTE — Progress Notes (Signed)
      [address]       [place] [address]             [phone]      7 Days Post-Op  Procedure(s) (LRB): CORONARY ARTERY BYPASS GRAFTING times four using left internal mammary and right saphenous vein (N/A) INTRAOPERATIVE TRANSESOPHAGEAL ECHOCARDIOGRAM (N/A) Subjective: Feels well  Objective  Telemetry sinus rhythm  Temp:  [97.4 F (36.3 C)-98.7 F (37.1 C)] 97.6 F (36.4 C) (12/30 0636) Pulse Rate:  [61-77] 73 (12/30 0636) Resp:  [14-18] 18 (12/30 0636) BP: (97-132)/(42-67) 122/49 mmHg (12/30 0636) SpO2:  [93 %-98 %] 93 % (12/30 0636) Weight:  [202 lb 13.2 oz (92 kg)-205 lb 11 oz (93.3 kg)] 203 lb 0.7 oz (92.1 kg) (12/30 [id number])   Intake/Output Summary (Last 24 hours) at 01/07/13 0728 Last data filed at 01/06/13 1733  Gross per 24 hour  Intake    120 ml  Output   1036 ml  Net   -916 ml       General appearance: alert, cooperative and no distress Heart: regular rate and rhythm Lungs: clear to auscultation bilaterally Abdomen: benign Extremities: min edema Wound: incis healing well  Lab Results:  Recent Labs  01/05/13 0610  NA 134*  K 3.9  CL 92*  CO2 31  GLUCOSE 110*  BUN 38*  CREATININE 7.61*  CALCIUM 9.2   No results found for this basename: AST, ALT, ALKPHOS, BILITOT, PROT, ALBUMIN,  in the last 72 hours No results found for this basename: LIPASE, AMYLASE,  in the last 72 hours  Recent Labs  01/05/13 0610  WBC 7.7  HGB 9.1*  HCT 27.4*  MCV 91.0  PLT 218   No results found for this basename: CKTOTAL, CKMB, TROPONINI,  in the last 72 hours No components found with this basename: POCBNP,  No results found for this basename: DDIMER,  in the last 72 hours No results found for this basename: HGBA1C,  in the last 72 hours No results found for this basename: CHOL, HDL, LDLCALC, TRIG, CHOLHDL,  in the last 72 hours No results found for this basename: TSH, T4TOTAL, FREET3, T3FREE, THYROIDAB,  in the last 72 hours No results  found for this basename: VITAMINB12, FOLATE, FERRITIN, TIBC, IRON, RETICCTPCT,  in the last 72 hours  Medications: Scheduled . aspirin EC  81 mg Oral Daily  . atorvastatin  20 mg Oral q1800  . calcium acetate  1,334 mg Oral TID WC  . darbepoetin (ARANESP) injection - DIALYSIS  200 mcg Intravenous Q Wed-HD  . docusate sodium  200 mg Oral Daily  . enoxaparin (LOVENOX) injection  30 mg Subcutaneous Q24H  . ferric gluconate (FERRLECIT/NULECIT) IV  125 mg Intravenous Q T,Th,Sa-HD  . metoprolol tartrate  12.5 mg Oral BID  . pantoprazole  40 mg Oral QAC breakfast  . sodium chloride  3 mL Intravenous Q12H  . traMADol  50 mg Oral Q12H     Radiology/Studies:  No results found.  INR: Will add last result for INR, ABG once components are confirmed Will add last 4 CBG results once components are confirmed  Assessment/Plan: S/P Procedure(s) (LRB): CORONARY ARTERY BYPASS GRAFTING times four using left internal mammary and right saphenous vein (N/A) INTRAOPERATIVE TRANSESOPHAGEAL ECHOCARDIOGRAM (N/A) Plan for discharge: see discharge orders   LOS: 12 days    [name] [name] 12/30/20147:28 AM

## 2013-01-07 NOTE — Progress Notes (Signed)
CT sutures  Removed as ordered and steri strips applied. Will monitor patient. Harutyun Monteverde, Bettina Gavia  rN

## 2013-01-22 ENCOUNTER — Encounter: Payer: Self-pay | Admitting: Interventional Cardiology

## 2013-01-22 ENCOUNTER — Ambulatory Visit (INDEPENDENT_AMBULATORY_CARE_PROVIDER_SITE_OTHER): Payer: Medicare Other | Admitting: Interventional Cardiology

## 2013-01-22 ENCOUNTER — Encounter (INDEPENDENT_AMBULATORY_CARE_PROVIDER_SITE_OTHER): Payer: Medicare Other | Admitting: Physician Assistant

## 2013-01-22 VITALS — BP 158/60 | HR 68 | Ht 73.0 in | Wt 206.0 lb

## 2013-01-22 DIAGNOSIS — Z951 Presence of aortocoronary bypass graft: Secondary | ICD-10-CM

## 2013-01-22 DIAGNOSIS — E785 Hyperlipidemia, unspecified: Secondary | ICD-10-CM

## 2013-01-22 DIAGNOSIS — N186 End stage renal disease: Secondary | ICD-10-CM

## 2013-01-22 DIAGNOSIS — I10 Essential (primary) hypertension: Secondary | ICD-10-CM

## 2013-01-22 NOTE — Progress Notes (Signed)
Patient ID: [name] [name], male   DOB: Dec 12, 1930, 80 y.o.   MRN: [id number]    [address], [address] [place], [place]  [address] Phone: [phone] Fax:  [phone]  Date:  01/22/2013   ID:  [name] [name], DOB 03-14-1930, MRN [id number]  PCP:  [name], MD   ASSESSMENT:  1. Coronary artery disease now several weeks status post bypass grafting by Dr. Servando Snare on 12/31/2012  2. Hypertension, controlled  3. Hyperlipidemia on therapy  4. His stage renal disease on chronic dialysis.  PLAN:  1. progress physical activity  2. Continue the current medical regimen  3. Cautioned to call if angina, dyspnea, fever, or tachycardia  4. Followup with Dr. Percival Spanish in 4 weeks.   SUBJECTIVE: [name] [name] is a 80 y.o. male is status post coronary artery bypass grafting by Dr. Servando Snare on descent 23rd. His presenting symptom was exertional dyspnea and non-ST elevation myocardial infarction. Catheterization demonstrated severe multivessel coronary disease which led to bypass grafting. He is a known end-stage renal disease patient. His on chronic dialysis. Since discharge from the hospital dyspnea has not recurred. He denies angina, palpitations, edema, chills, fever, and syncope. Appetite is improving. There is no edema. He dialyzes on Tuesday, Thursday, and Saturday.   Wt Readings from Last 3 Encounters:  01/22/13 206 lb (93.441 kg)  01/07/13 203 lb 0.7 oz (92.1 kg)  01/07/13 203 lb 0.7 oz (92.1 kg)     Past Medical History  Diagnosis Date  . Hypertension   . Gout   . Dyslipidemia   . Obesity   . Volume overload   . MYOCARDIAL INFARCTION, ACUTE, NON-Q WAVE 04/26/2009    Qualifier: Diagnosis of  By: Percival Spanish, MD, Farrel Gordon    . Chest pain   . Anemia of chronic disease   . Shortness of breath     "comes up at anytime" (12/26/2012)  . Type II diabetes mellitus     "diet controlled" (12/26/2012)  . ESRD (end stage renal disease) on dialysis     "[employer]rial; T, Thurs,  Sat" (12/26/2012)    Current Outpatient Prescriptions  Medication Sig Dispense Refill  . aspirin EC 81 MG EC tablet Take 1 tablet (81 mg total) by mouth daily.      Marland Kitchen atorvastatin (LIPITOR) 20 MG tablet Take 1 tablet (20 mg total) by mouth daily at 6 PM.  30 tablet  1  . calcium acetate (PHOSLO) 667 MG capsule Take 2 capsules (1,334 mg total) by mouth 3 (three) times daily with meals.  180 capsule  1  . metoprolol tartrate (LOPRESSOR) 12.5 mg TABS tablet Take 0.5 tablets (12.5 mg total) by mouth 2 (two) times daily.  60 each  1  . omeprazole (PRILOSEC) 20 MG capsule Take 20 mg by mouth 2 (two) times daily.       . sodium chloride 0.9 % SOLN 100 mL with ferric gluconate 12.5 MG/ML SOLN 125 mg Inject 125 mg into the vein Every Tuesday,Thursday,and Saturday with dialysis.      Marland Kitchen traMADol (ULTRAM) 50 MG tablet Take 1 tablet (50 mg total) by mouth every 12 (twelve) hours as needed.  50 tablet  0   No current facility-administered medications for this visit.    Allergies:   No Known Allergies  Social History:  The patient  reports that he quit smoking about 17 years ago. His smoking use included Cigarettes. He has a 40 pack-year smoking history. He has never used smokeless tobacco. He reports that  he drinks alcohol. He reports that he does not use illicit drugs.   ROS:  Please see the history of present illness.   Denies chills, anorexia, fever, palpitations, syncope, and transient neurological symptoms   All other systems reviewed and negative.   OBJECTIVE: VS:  BP 158/60  Pulse 68  Ht 6\' 1"  (1.854 m)  Wt 206 lb (93.441 kg)  BMI 27.18 kg/m2 Well nourished, well developed, in no acute distress, skin is dry. HEENT: normal Neck: JVD flat. Carotid bruit  absent  Cardiac:  normal S1, S2; RRR; no murmur. An S4 gallop is present Lungs:  clear to auscultation bilaterally with the exception of basilar diminished breath sounds bilateral. Abd: soft, nontender, no hepatomegaly Ext: Edema absent.  Pulses 1+ bilateral Skin: warm and dry Neuro:  CNs 2-12 intact, no focal abnormalities noted  EKG:  LVH with strain, normal sinus rhythm, inferior Q waves consistent with prior       Signed, Illene Labrador III, MD 01/22/2013 9:25 AM

## 2013-01-22 NOTE — Patient Instructions (Signed)
Your physician recommends that you continue on your current medications as directed. Please refer to the Current Medication list given to you today.  Your physician recommends that you schedule a follow-up appointment in: 1 month with Dr.Hochrein  Call the office if you develop a fever, shortness of breath, chest discomfort/or your heart racing. [phone]

## 2013-01-23 ENCOUNTER — Encounter: Payer: Medicare Other | Admitting: Interventional Cardiology

## 2013-01-29 ENCOUNTER — Other Ambulatory Visit: Payer: Self-pay | Admitting: *Deleted

## 2013-01-29 DIAGNOSIS — I2510 Atherosclerotic heart disease of native coronary artery without angina pectoris: Secondary | ICD-10-CM

## 2013-01-30 ENCOUNTER — Ambulatory Visit (INDEPENDENT_AMBULATORY_CARE_PROVIDER_SITE_OTHER): Payer: Self-pay | Admitting: Cardiothoracic Surgery

## 2013-01-30 ENCOUNTER — Encounter: Payer: Self-pay | Admitting: Cardiothoracic Surgery

## 2013-01-30 ENCOUNTER — Encounter: Payer: Self-pay | Admitting: Interventional Cardiology

## 2013-01-30 ENCOUNTER — Ambulatory Visit
Admission: RE | Admit: 2013-01-30 | Discharge: 2013-01-30 | Disposition: A | Discharge status: Home / Self Care | Payer: Medicare Other | Source: Ambulatory Visit | Attending: Cardiothoracic Surgery | Admitting: Cardiothoracic Surgery

## 2013-01-30 ENCOUNTER — Ambulatory Visit: Payer: Medicare Other | Admitting: Cardiothoracic Surgery

## 2013-01-30 VITALS — BP 157/61 | HR 74 | Resp 16 | Ht 73.0 in | Wt 206.0 lb

## 2013-01-30 DIAGNOSIS — I2510 Atherosclerotic heart disease of native coronary artery without angina pectoris: Secondary | ICD-10-CM

## 2013-01-30 DIAGNOSIS — Z951 Presence of aortocoronary bypass graft: Secondary | ICD-10-CM

## 2013-01-30 NOTE — Progress Notes (Signed)
[address]       [place] [address]             [phone]                  [name] [name] [facility] Medical Record [id number] Date of Birth: 1930/11/01  [name] [name]* [name] Downing, MD  Chief Complaint:   PostOp Follow Up Visit 12/31/2012  OPERATIVE REPORT  PREOPERATIVE DIAGNOSIS: New onset of angina with a severe three-vessel  coronary artery disease with non-ST elevation myocardial infarction, and  chronic dialysis.  POSTOPERATIVE DIAGNOSIS: New onset of angina with a severe three-vessel  coronary artery disease with non-ST elevation myocardial infarction, and  chronic dialysis.  SURGICAL PROCEDURE: Urgent coronary artery bypass grafting x4 with left  internal mammary to the left anterior descending coronary artery,  reverse saphenous vein graft to the intermediate, reverse saphenous vein  graft to the obtuse marginal, reverse saphenous vein graft to the  posterior descending coronary artery with right thigh and calf endo  veins harvesting.  SURGEON: [name] Bal, MD   History of Present Illness:     Patient returns to the office today in followup after his urgent coronary artery bypass grafting x4 done on 12/31/2012. His presenting symptom was exertional dyspnea and non-ST elevation myocardial infarction. Considering the patient's age of almost 20 and on chronic hemodialysis he is making very good recovery since surgery. He is remained mobile, according to his wife has been able to get around the house well without symptoms of shortness of breath as he had on presentation. He declined going to cardiac rehabilitation. He's had no overt symptoms of recurrent angina or congestive heart failure.   History  Smoking status  . Former Smoker -- 1.00 packs/day for 40 years  . Types: Cigarettes  . Quit date: 06/16/1995  Smokeless tobacco  . Never Used       No Known Allergies  Current Outpatient Prescriptions  Medication Sig  Dispense Refill  . aspirin EC 81 MG EC tablet Take 1 tablet (81 mg total) by mouth daily.      Marland Kitchen atorvastatin (LIPITOR) 20 MG tablet Take 1 tablet (20 mg total) by mouth daily at 6 PM.  30 tablet  1  . calcium acetate (PHOSLO) 667 MG capsule Take 2 capsules (1,334 mg total) by mouth 3 (three) times daily with meals.  180 capsule  1  . metoprolol tartrate (LOPRESSOR) 12.5 mg TABS tablet Take 0.5 tablets (12.5 mg total) by mouth 2 (two) times daily.  60 each  1  . omeprazole (PRILOSEC) 20 MG capsule Take 20 mg by mouth 2 (two) times daily.       . sodium chloride 0.9 % SOLN 100 mL with ferric gluconate 12.5 MG/ML SOLN 125 mg Inject 125 mg into the vein Every Tuesday,Thursday,and Saturday with dialysis.      Marland Kitchen traMADol (ULTRAM) 50 MG tablet Take 1 tablet (50 mg total) by mouth every 12 (twelve) hours as needed.  50 tablet  0   No current facility-administered medications for this visit.       Physical Exam: BP 157/61  Pulse 74  Resp 16  Ht 6\' 1"  (1.854 m)  Wt 206 lb (93.441 kg)  BMI 27.18 kg/m2  General appearance: alert, cooperative and no distress Neurologic: intact Heart: regular rate and rhythm, S1, S2 normal, no murmur, click, rub or gallop Lungs: diminished breath sounds LLL Abdomen: soft, non-tender; bowel sounds normal; no masses,  no organomegaly Extremities: extremities normal, atraumatic, no cyanosis or edema and Homans sign is negative, no sign of DVT Wound: Sternum is stable and well-healed, has a small amount of eschar but without infection at the right end of vein harvest site   Diagnostic Studies & Laboratory data:         Recent Radiology Findings: Dg Chest 2 View  01/30/2013   CLINICAL DATA:  CABG.  CAD.  EXAM: CHEST  2 VIEW  COMPARISON:  01/05/2013  FINDINGS: Postop CABG changes. Improved aeration in the lung bases since the prior study. Currently there is no evidence of heart failure or edema. Right pleural effusion has resolved. Small left effusion and mild left  lower lobe atelectasis remain and are slightly improved.  IMPRESSION: Overall improvement since the prior study. Small left effusion and left lower lobe atelectasis remain.   Electronically Signed   By: Franchot Gallo M.D.   On: 01/30/2013 12:40      Recent Labs: Lab Results  Component Value Date   WBC 7.7 01/05/2013   HGB 9.1* 01/05/2013   HCT 27.4* 01/05/2013   PLT 218 01/05/2013   GLUCOSE 110* 01/05/2013   CHOL 170 12/27/2012   TRIG 136 12/27/2012   HDL 36* 12/27/2012   LDLCALC 107* 12/27/2012   ALT 8 01/01/2013   AST 29 01/01/2013   NA 134* 01/05/2013   K 3.9 01/05/2013   CL 92* 01/05/2013   CREATININE 7.61* 01/05/2013   BUN 38* 01/05/2013   CO2 31 01/05/2013   TSH 0.914 12/26/2012   INR 1.45 12/31/2012   HGBA1C 5.5 12/30/2012      Assessment / Plan:     Patient doing well after urgent coronary artery bypass grafting 12/31/12 when he presented with exertional dyspnea and non-ST elevation myocardial infarction. He continues to increase his physical activity appropriately, was cautioned about any heavy lifting He did not wish to go to cardiac rehabilitation He has a followup appointment with cardiology in 3 weeks.    [name] [name] B 01/30/2013 1:30 PM

## 2013-02-24 ENCOUNTER — Encounter: Payer: Self-pay | Admitting: Cardiology

## 2013-02-24 ENCOUNTER — Ambulatory Visit (INDEPENDENT_AMBULATORY_CARE_PROVIDER_SITE_OTHER): Payer: Medicare Other | Admitting: Cardiology

## 2013-02-24 VITALS — BP 160/68 | HR 70 | Resp 97 | Ht 73.0 in | Wt 206.8 lb

## 2013-02-24 DIAGNOSIS — Z951 Presence of aortocoronary bypass graft: Secondary | ICD-10-CM

## 2013-02-24 NOTE — Progress Notes (Signed)
HPI The patient is status post CABG.  Since he was discharged from the hospital he has done quite well. Because dialysis takes up so much time he's not doing cardiac rehabilitation. I get the impression that he is rather sedentary. He denies any of his previous cardiovascular symptoms which was predominantly dyspnea. He says he can walk 50 yards to his mailbox without shortness of breath he was having previously. The patient denies any new symptoms such as chest discomfort, neck or arm discomfort. There has been no new shortness of breath, PND or orthopnea. There have been no reported palpitations, presyncope or syncope.   No Known Allergies  Current Outpatient Prescriptions  Medication Sig Dispense Refill  . aspirin EC 81 MG EC tablet Take 1 tablet (81 mg total) by mouth daily.      Marland Kitchen atorvastatin (LIPITOR) 20 MG tablet Take 1 tablet (20 mg total) by mouth daily at 6 PM.  30 tablet  1  . calcium acetate (PHOSLO) 667 MG capsule Take 2 capsules (1,334 mg total) by mouth 3 (three) times daily with meals.  180 capsule  1  . metoprolol tartrate (LOPRESSOR) 12.5 mg TABS tablet Take 0.5 tablets (12.5 mg total) by mouth 2 (two) times daily.  60 each  1  . omeprazole (PRILOSEC) 20 MG capsule Take 20 mg by mouth 2 (two) times daily.       . sodium chloride 0.9 % SOLN 100 mL with ferric gluconate 12.5 MG/ML SOLN 125 mg Inject 125 mg into the vein Every Tuesday,Thursday,and Saturday with dialysis.      Marland Kitchen traMADol (ULTRAM) 50 MG tablet Take 1 tablet (50 mg total) by mouth every 12 (twelve) hours as needed.  50 tablet  0   No current facility-administered medications for this visit.    Past Medical History  Diagnosis Date  . Hypertension   . Gout   . Dyslipidemia   . Obesity   . Volume overload   . MYOCARDIAL INFARCTION, ACUTE, NON-Q WAVE 04/26/2009    Qualifier: Diagnosis of  By: Percival Spanish, MD, Farrel Gordon    . Chest pain   . Anemia of chronic disease   . Shortness of breath     "comes up at  anytime" (12/26/2012)  . Type II diabetes mellitus     "diet controlled" (12/26/2012)  . ESRD (end stage renal disease) on dialysis     "Industrial; T, Thurs, Sat" (12/26/2012)    Past Surgical History  Procedure Laterality Date  . Arteriovenous graft placement Right 12/2008    "upper arm"  . Coronary artery bypass graft N/A 12/31/2012    Procedure: CORONARY ARTERY BYPASS GRAFTING times four using left internal mammary and right saphenous vein;  Surgeon: Grace Isaac, MD;  Location: [facility];  Service: Open Heart Surgery;  Laterality: N/A;  . Intraoperative transesophageal echocardiogram N/A 12/31/2012    Procedure: INTRAOPERATIVE TRANSESOPHAGEAL ECHOCARDIOGRAM;  Surgeon: Grace Isaac, MD;  Location: [facility];  Service: Open Heart Surgery;  Laterality: N/A;    ROS:  GENERAL:  Well appearing HEENT:  Pupils equal round and reactive, fundi not visualized, oral mucosa unremarkable NECK:  No jugular venous distention, waveform within normal limits, carotid upstroke brisk and symmetric, no bruits, no thyromegaly LYMPHATICS:  No cervical, inguinal adenopathy LUNGS:  Clear to auscultation bilaterally BACK:  No CVA tenderness CHEST:  Unremarkable HEART:  PMI not displaced or sustained,S1 and S2 within normal limits, no S3, no S4, no clicks, no rubs, no murmurs ABD:  Flat, positive  bowel sounds normal in frequency in pitch, no bruits, no rebound, no guarding, no midline pulsatile mass, no hepatomegaly, no splenomegaly EXT:  2 plus pulses throughout, no edema, no cyanosis no clubbing SKIN:  No rashes no nodules NEURO:  Cranial nerves II through XII grossly intact, motor grossly intact throughout PSYCH:  Cognitively intact, oriented to person place and time  PHYSICAL EXAM BP 160/68  Pulse 70  Resp 97  Ht 6\' 1"  (1.854 m)  Wt 206 lb 12.8 oz (93.804 kg)  BMI 27.29 kg/m2 GENERAL:  Well appearing HEENT:  Pupils equal round and reactive, fundi not visualized, oral mucosa unremarkable NECK:   No jugular venous distention, waveform within normal limits, carotid upstroke brisk and symmetric, no bruits, no thyromegaly LYMPHATICS:  No cervical, inguinal adenopathy LUNGS:  Clear to auscultation bilaterally BACK:  No CVA tenderness CHEST:  Well healed sternotomy scar. HEART:  PMI not displaced or sustained,S1 and S2 within normal limits, no S3, no S4, no clicks, no rubs, left upper sternal border brief systolic murmur.  No diastolic murmurs ABD:  Flat, positive bowel sounds normal in frequency in pitch, no bruits, no rebound, no guarding, no midline pulsatile mass, no hepatomegaly, no splenomegaly EXT:  2 plus pulses throughout, no edema, no cyanosis no clubbing, right arm dialysis fistula SKIN:  No rashes no nodules NEURO:  Cranial nerves II through XII grossly intact, motor grossly intact throughout PSYCH:  Cognitively intact, oriented to person place and time   ASSESSMENT AND PLAN  CAD/CABG:  The patient has no new sypmtoms.  No further cardiovascular testing is indicated.  We will continue with aggressive risk reduction and meds as listed.  I encouraged him to do more walking and gave him specific instructions.    HTN:    The blood pressure is slightly elevated.  However, it goes down during dialysis. No change in medications is indicated. We will continue with therapeutic lifestyle changes (TLC).

## 2013-02-24 NOTE — Patient Instructions (Signed)
Your physician recommends that you continue on your current medications as directed. Please refer to the Current Medication list given to you today.  Your physician wants you to follow-up in: 1 year with Dr. Percival Spanish. You will receive a reminder letter in the mail two months in advance. If you don't receive a letter, please call our office to schedule the follow-up appointment.

## 2013-12-18 ENCOUNTER — Encounter (HOSPITAL_COMMUNITY): Payer: Self-pay | Admitting: Cardiovascular Disease

## 2014-01-22 ENCOUNTER — Encounter (HOSPITAL_COMMUNITY): Payer: Self-pay | Admitting: Cardiology

## 2014-03-02 ENCOUNTER — Encounter: Payer: Self-pay | Admitting: Cardiology

## 2014-03-02 ENCOUNTER — Ambulatory Visit (INDEPENDENT_AMBULATORY_CARE_PROVIDER_SITE_OTHER): Payer: Medicare Other | Admitting: Cardiology

## 2014-03-02 VITALS — BP 190/80 | HR 74 | Ht 73.0 in | Wt 212.0 lb

## 2014-03-02 DIAGNOSIS — I25810 Atherosclerosis of coronary artery bypass graft(s) without angina pectoris: Secondary | ICD-10-CM

## 2014-03-02 MED ORDER — AMLODIPINE BESYLATE 2.5 MG PO TABS: 2.5000 mg | ORAL_TABLET | Freq: Every day | ORAL | Status: DC

## 2014-03-02 NOTE — Patient Instructions (Signed)
Your physician recommends that you schedule a follow-up appointment in: one year with Dr. Percival Spanish  Start taking Norvasc 2.5 mg daily as directed

## 2014-03-02 NOTE — Progress Notes (Signed)
HPI The patient is status post CABG.   He denies any of his previous cardiovascular symptoms which was predominantly dyspnea. He says he can walk 50 yards to his mailbox without shortness of breath he was having previously.  However, it doesn't seem like he does much other activity. The patient denies any new symptoms such as chest discomfort, neck or arm discomfort. There has been no new shortness of breath, PND or orthopnea. There have been no reported palpitations, presyncope or syncope.   No Known Allergies  Current Outpatient Prescriptions  Medication Sig Dispense Refill  . aspirin EC 81 MG EC tablet Take 1 tablet (81 mg total) by mouth daily.    Marland Kitchen atorvastatin (LIPITOR) 20 MG tablet Take 1 tablet (20 mg total) by mouth daily at 6 PM. 30 tablet 1  . calcium acetate (PHOSLO) 667 MG capsule Take 2 capsules (1,334 mg total) by mouth 3 (three) times daily with meals. 180 capsule 1  . metoprolol tartrate (LOPRESSOR) 12.5 mg TABS tablet Take 0.5 tablets (12.5 mg total) by mouth 2 (two) times daily. 60 each 1  . omeprazole (PRILOSEC) 20 MG capsule Take 20 mg by mouth 2 (two) times daily.     . sodium chloride 0.9 % SOLN 100 mL with ferric gluconate 12.5 MG/ML SOLN 125 mg Inject 125 mg into the vein Every Tuesday,Thursday,and Saturday with dialysis.    Marland Kitchen traMADol (ULTRAM) 50 MG tablet Take 1 tablet (50 mg total) by mouth every 12 (twelve) hours as needed. 50 tablet 0   No current facility-administered medications for this visit.    Past Medical History  Diagnosis Date  . Hypertension   . Gout   . Dyslipidemia   . Obesity   . Volume overload   . MYOCARDIAL INFARCTION, ACUTE, NON-Q WAVE 04/26/2009    Qualifier: Diagnosis of  By: Percival Spanish, MD, Farrel Gordon    . Chest pain   . Anemia of chronic disease   . Shortness of breath     "comes up at anytime" (12/26/2012)  . Type II diabetes mellitus     "diet controlled" (12/26/2012)  . ESRD (end stage renal disease) on dialysis     "Industrial;  T, Thurs, Sat" (12/26/2012)    Past Surgical History  Procedure Laterality Date  . Arteriovenous graft placement Right 12/2008    "upper arm"  . Coronary artery bypass graft N/A 12/31/2012    Procedure: CORONARY ARTERY BYPASS GRAFTING times four using left internal mammary and right saphenous vein;  Surgeon: Grace Isaac, MD;  Location: [facility];  Service: Open Heart Surgery;  Laterality: N/A;  . Intraoperative transesophageal echocardiogram N/A 12/31/2012    Procedure: INTRAOPERATIVE TRANSESOPHAGEAL ECHOCARDIOGRAM;  Surgeon: Grace Isaac, MD;  Location: [facility];  Service: Open Heart Surgery;  Laterality: N/A;  . Left heart catheterization with coronary angiogram N/A 12/30/2012    Procedure: LEFT HEART CATHETERIZATION WITH CORONARY ANGIOGRAM;  Surgeon: Burnell Blanks, MD;  Location: [facility] C[facility];  Service: Cardiovascular;  Laterality: N/A;    ROS:    As stated in the HPI and negative for all other systems.   PHYSICAL EXAM BP 190/80 mmHg  Pulse 74  Ht 6\' 1"  (1.854 m)  Wt 212 lb (96.163 kg)  BMI 27.98 kg/m2 GENERAL:  Well appearing HEENT:  Pupils equal round and reactive, fundi not visualized, oral mucosa unremarkable NECK:  No jugular venous distention, waveform within normal limits, carotid upstroke brisk and symmetric, no bruits, no thyromegaly LUNGS:  Clear to auscultation  bilaterally CHEST:  Well healed sternotomy scar. HEART:  PMI not displaced or sustained,S1 and S2 within normal limits, no S3, no S4, no clicks, no rubs, left upper sternal border brief systolic murmur.  No diastolic murmurs ABD:  Flat, positive bowel sounds normal in frequency in pitch, no bruits, no rebound, no guarding, no midline pulsatile mass, no hepatomegaly, no splenomegaly EXT:  2 plus pulses throughout, no edema, no cyanosis no clubbing, right arm dialysis fistula    ASSESSMENT AND PLAN  CAD/CABG:  The patient has no new sypmtoms.  No further cardiovascular testing is indicated.  We will  continue with aggressive risk reduction and meds as listed.  I encouraged him to do more walking and gave him specific instructions.    HTN:    The blood pressure is slightly elevated.  I will add Norvasc 2.5 mg daily. I think he can take this even on dialysis days though rarely he says his blood pressure drops.

## 2014-03-25 IMAGING — CR DG CHEST 2V
2 series · 2 of 2 positions shown · non-contrast
Comparison: 01/05/2013

CLINICAL DATA: CABG.  CAD.

EXAM:
CHEST  2 VIEW

[w chest pa]
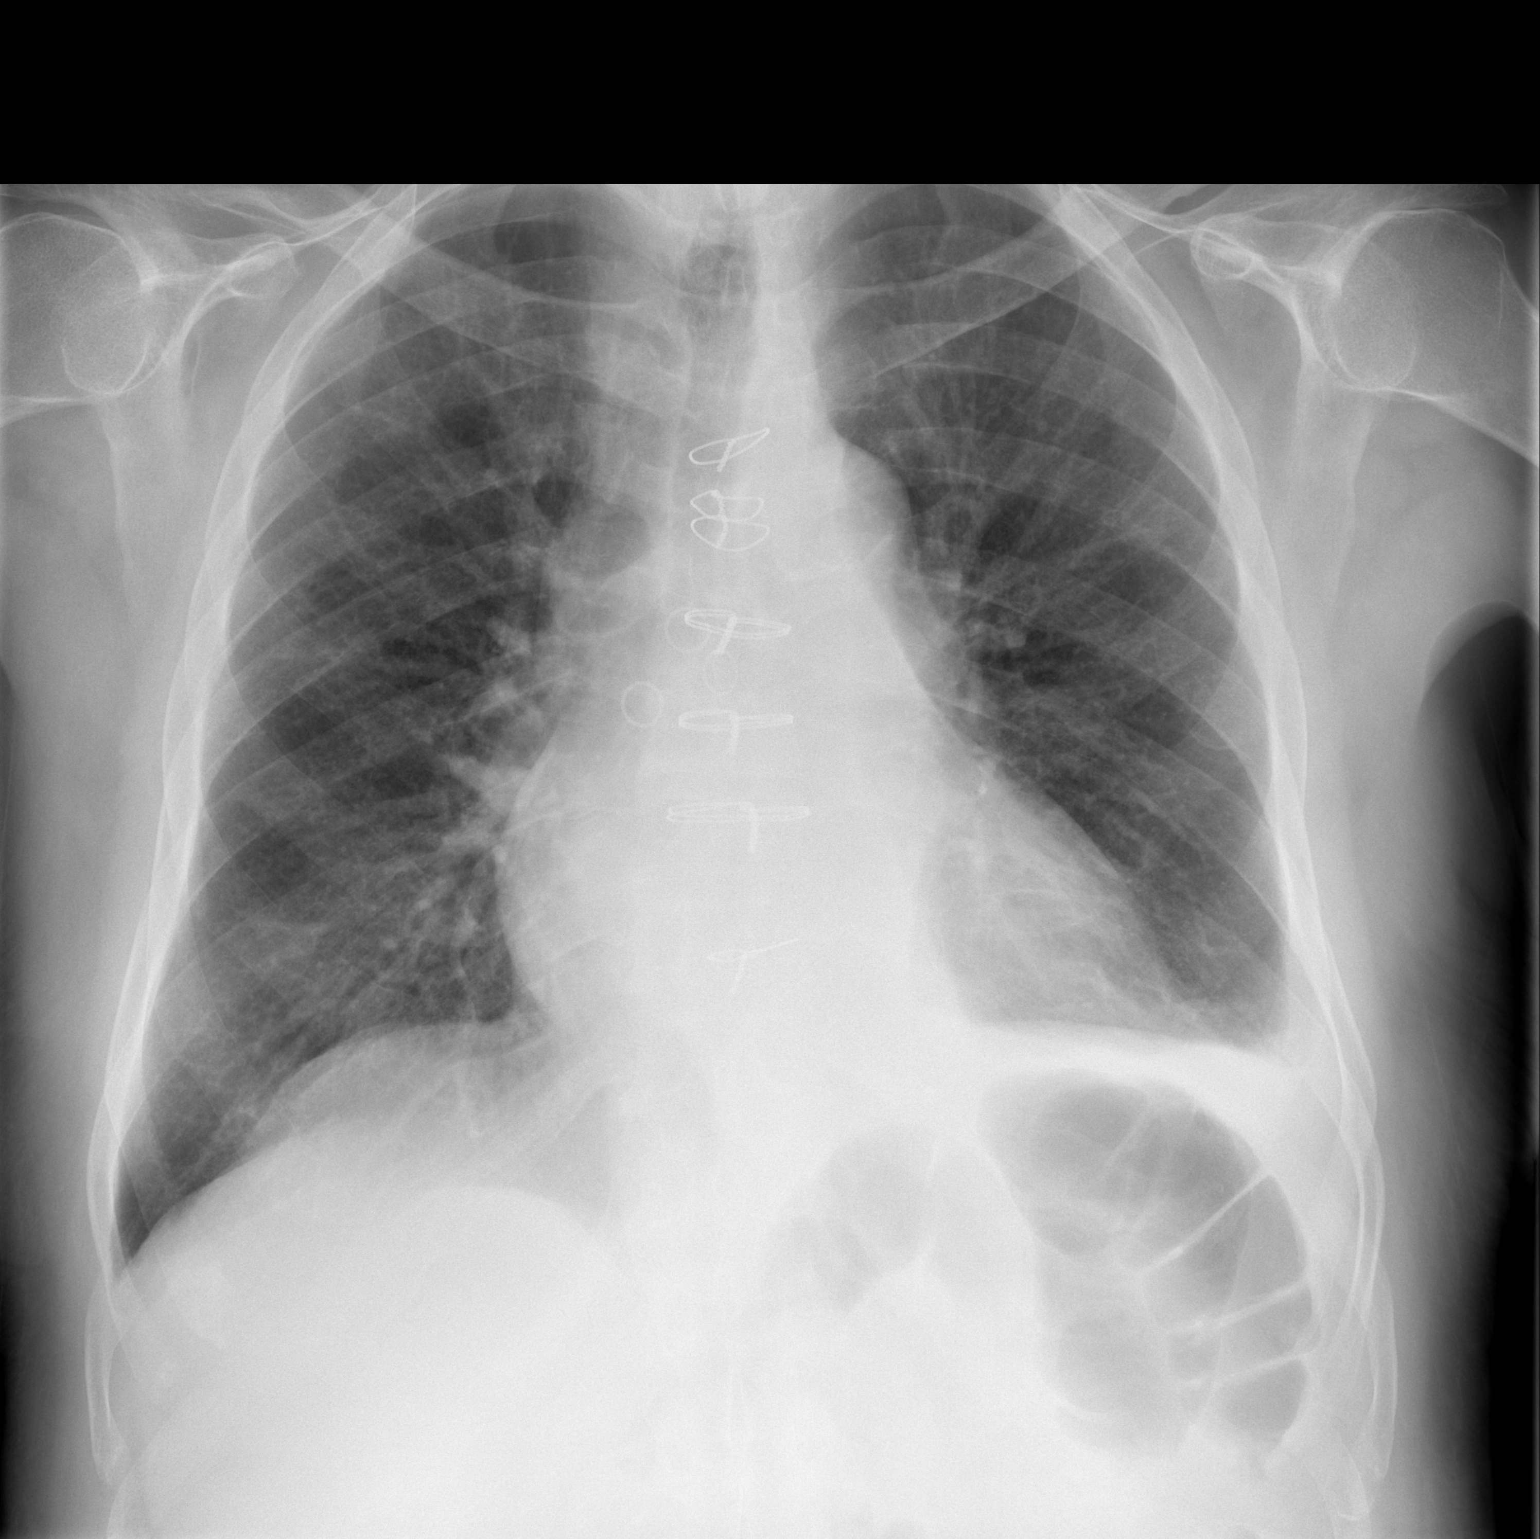

[w chest lat]
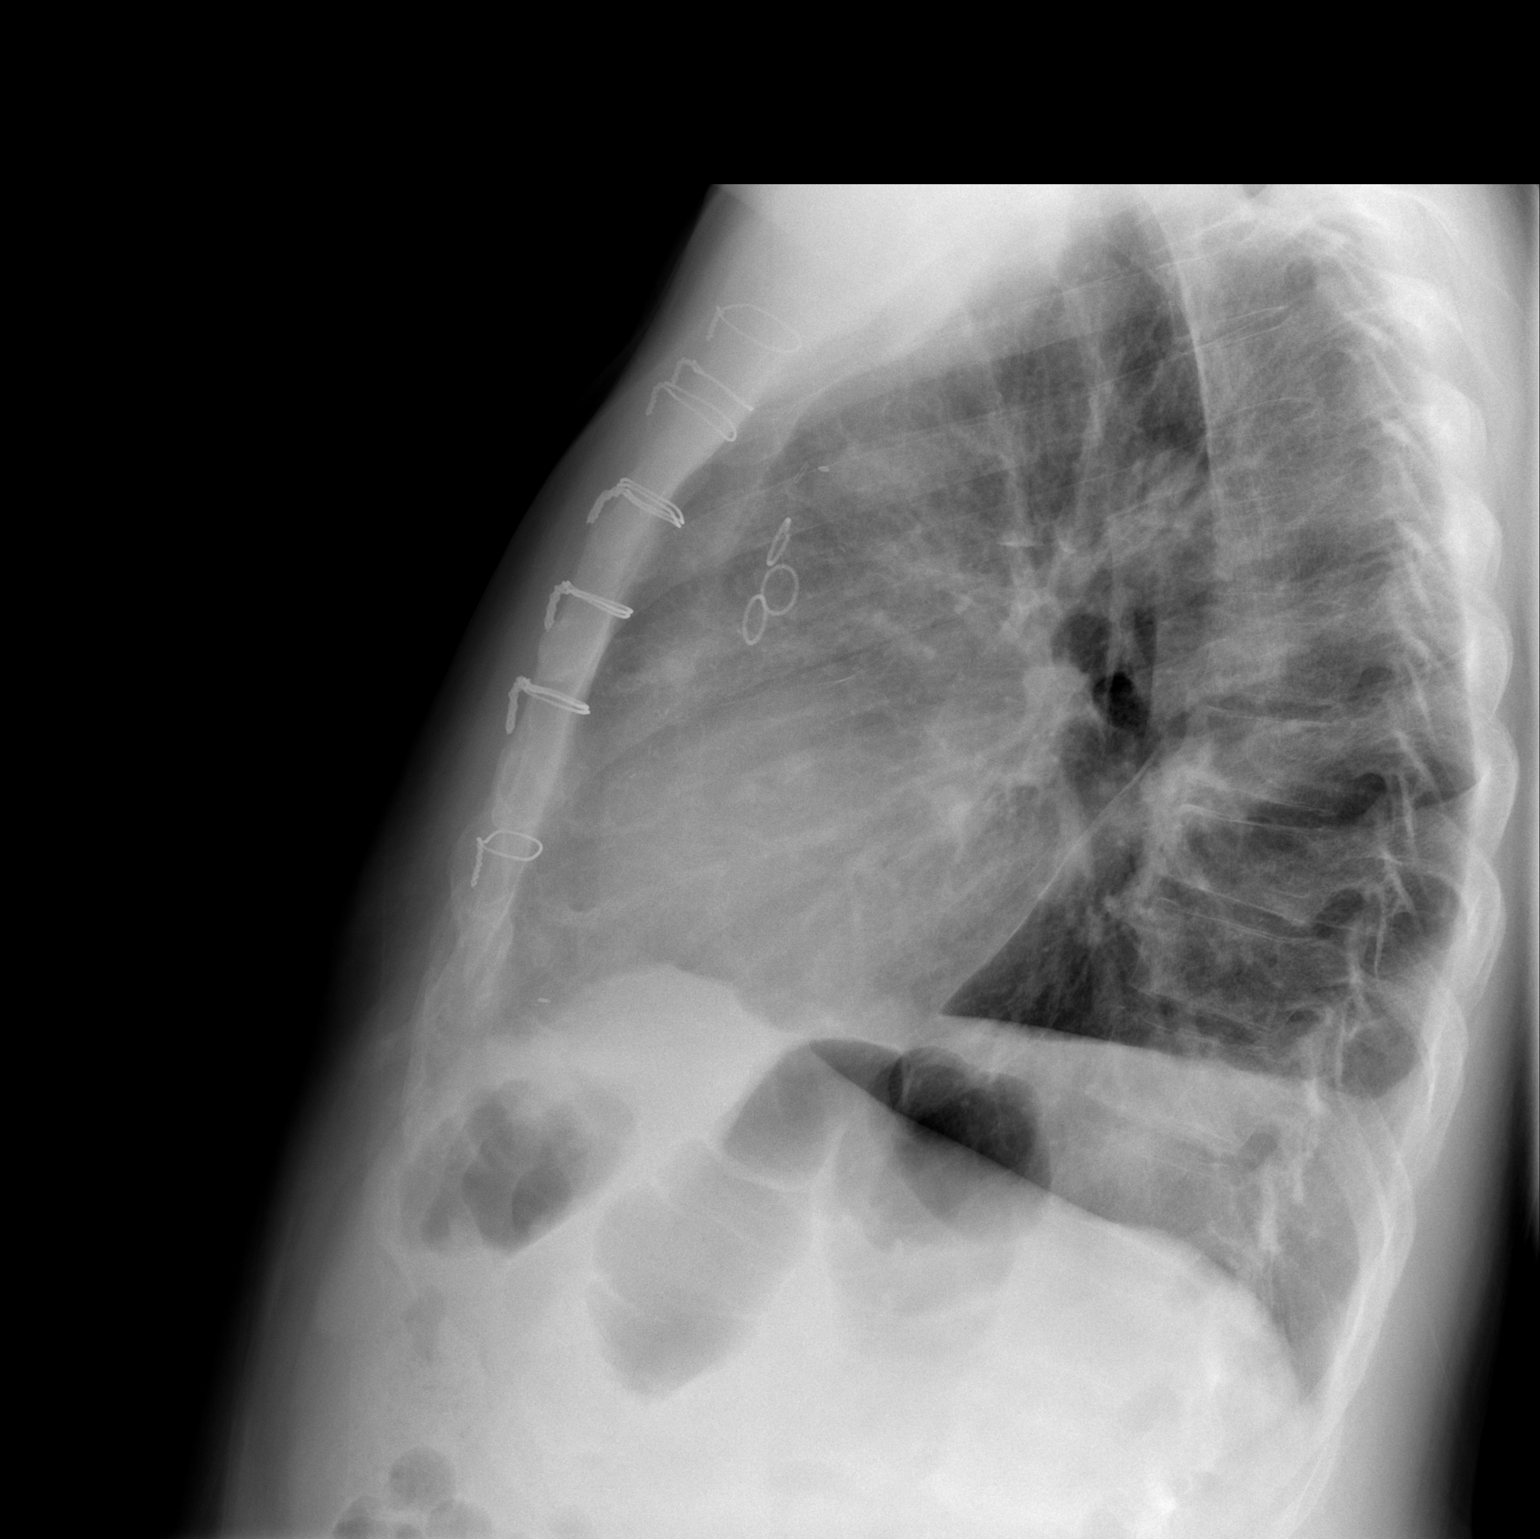

[2 of 2 positions shown; findings below may reference images not displayed]

FINDINGS: Postop CABG changes. Improved aeration in the lung bases since the
prior study. Currently there is no evidence of heart failure or
edema. Right pleural effusion has resolved. Small left effusion and
mild left lower lobe atelectasis remain and are slightly improved.
IMPRESSION: Overall improvement since the prior study. Small left effusion and
left lower lobe atelectasis remain.

## 2014-05-25 ENCOUNTER — Other Ambulatory Visit: Payer: Self-pay | Admitting: Gastroenterology

## 2014-06-11 ENCOUNTER — Observation Stay (HOSPITAL_COMMUNITY)
Admission: EM | Admit: 2014-06-11 | Discharge: 2014-06-13 | Disposition: A | Discharge status: Home / Self Care | Payer: Medicare Other | Attending: Internal Medicine | Admitting: Internal Medicine

## 2014-06-11 ENCOUNTER — Emergency Department (HOSPITAL_COMMUNITY): Payer: Medicare Other

## 2014-06-11 ENCOUNTER — Encounter (HOSPITAL_COMMUNITY): Payer: Self-pay

## 2014-06-11 DIAGNOSIS — Z7982 Long term (current) use of aspirin: Secondary | ICD-10-CM | POA: Diagnosis not present

## 2014-06-11 DIAGNOSIS — R0602 Shortness of breath: Secondary | ICD-10-CM | POA: Diagnosis not present

## 2014-06-11 DIAGNOSIS — N186 End stage renal disease: Secondary | ICD-10-CM | POA: Diagnosis not present

## 2014-06-11 DIAGNOSIS — E119 Type 2 diabetes mellitus without complications: Secondary | ICD-10-CM | POA: Diagnosis not present

## 2014-06-11 DIAGNOSIS — Z992 Dependence on renal dialysis: Secondary | ICD-10-CM | POA: Insufficient documentation

## 2014-06-11 DIAGNOSIS — E8770 Fluid overload, unspecified: Secondary | ICD-10-CM | POA: Insufficient documentation

## 2014-06-11 DIAGNOSIS — Z87891 Personal history of nicotine dependence: Secondary | ICD-10-CM | POA: Diagnosis not present

## 2014-06-11 DIAGNOSIS — Z951 Presence of aortocoronary bypass graft: Secondary | ICD-10-CM | POA: Diagnosis not present

## 2014-06-11 DIAGNOSIS — Z79899 Other long term (current) drug therapy: Secondary | ICD-10-CM | POA: Diagnosis not present

## 2014-06-11 DIAGNOSIS — E669 Obesity, unspecified: Secondary | ICD-10-CM | POA: Diagnosis present

## 2014-06-11 DIAGNOSIS — I120 Hypertensive chronic kidney disease with stage 5 chronic kidney disease or end stage renal disease: Secondary | ICD-10-CM | POA: Diagnosis not present

## 2014-06-11 DIAGNOSIS — D649 Anemia, unspecified: Secondary | ICD-10-CM | POA: Diagnosis not present

## 2014-06-11 DIAGNOSIS — I252 Old myocardial infarction: Secondary | ICD-10-CM | POA: Diagnosis not present

## 2014-06-11 DIAGNOSIS — E785 Hyperlipidemia, unspecified: Secondary | ICD-10-CM | POA: Insufficient documentation

## 2014-06-11 DIAGNOSIS — M109 Gout, unspecified: Secondary | ICD-10-CM | POA: Insufficient documentation

## 2014-06-11 LAB — PREPARE RBC (CROSSMATCH)

## 2014-06-11 LAB — CBC WITH DIFFERENTIAL/PLATELET
HCT: 21.9 % — ABNORMAL LOW (ref 39.0–52.0)
HEMOGLOBIN: 6.6 g/dL — AB (ref 13.0–17.0)
MCH: 29.2 pg (ref 26.0–34.0)
MCHC: 30.1 g/dL (ref 30.0–36.0)
MCV: 96.9 fL (ref 78.0–100.0)
PLATELETS: 241 10*3/uL (ref 150–400)
RBC: 2.26 MIL/uL — ABNORMAL LOW (ref 4.22–5.81)
RDW: 16.6 % — ABNORMAL HIGH (ref 11.5–15.5)
WBC: 3.4 10*3/uL — ABNORMAL LOW (ref 4.0–10.5)

## 2014-06-11 LAB — I-STAT CHEM 8, ED
BUN: 10 mg/dL (ref 6–20)
CHLORIDE: 95 mmol/L — AB (ref 101–111)
Calcium, Ion: 1.09 mmol/L — ABNORMAL LOW (ref 1.13–1.30)
Creatinine, Ser: 5.1 mg/dL — ABNORMAL HIGH (ref 0.61–1.24)
GLUCOSE: 91 mg/dL (ref 65–99)
HEMATOCRIT: 22 % — AB (ref 39.0–52.0)
Hemoglobin: 7.5 g/dL — ABNORMAL LOW (ref 13.0–17.0)
Potassium: 3.3 mmol/L — ABNORMAL LOW (ref 3.5–5.1)
SODIUM: 140 mmol/L (ref 135–145)
TCO2: 31 mmol/L (ref 0–100)

## 2014-06-11 LAB — I-STAT TROPONIN, ED: Troponin i, poc: 0.03 ng/mL (ref 0.00–0.08)

## 2014-06-11 MED ORDER — SODIUM CHLORIDE 0.9 % IV SOLN: 10.0000 mL/h | Freq: Once | INTRAVENOUS | Status: DC

## 2014-06-11 NOTE — ED Notes (Signed)
Pt placed into gown and on monitor upon arrival to room. Pt monitored by blood pressure, pulse ox, and 12 lead. Pts family at bedside. Pts EKG given to and signed by Dr. Mingo Amber

## 2014-06-11 NOTE — ED Notes (Signed)
Per EMS: Pt is a dialysis patient, new onset shortness of breath with exertion post treatment. Is a Tues Thurs. Sat. Schedule. Did complete entire treatment today. Family states pt has had a dry cough for several weeks. Pt hypertensive 208/76.

## 2014-06-11 NOTE — ED Provider Notes (Signed)
CSN: [id number]     Arrival date & time 06/11/14  2120 History   First MD Initiated Contact with Patient 06/11/14 2124     Chief Complaint  Patient presents with  . Shortness of Breath     (Consider location/radiation/quality/duration/timing/severity/associated sxs/prior Treatment) HPI Comments: For the past 3 weeks has had SOB with exertion that is progressively getting worse. Has not missed any dialysis sessions, is not having, CP. Denies nausea diaphoresis   Patient is a 81 y.o. male presenting with shortness of breath. The history is provided by the patient.  Shortness of Breath Severity:  Moderate Onset quality:  Gradual Duration:  3 weeks Timing:  Intermittent Progression:  Worsening Chronicity:  New Context: activity   Relieved by:  Rest Worsened by:  Activity Associated symptoms: no chest pain, no cough, no fever, no hemoptysis, no vomiting and no wheezing     Past Medical History  Diagnosis Date  . Hypertension   . Gout   . Dyslipidemia   . Obesity   . Volume overload   . MYOCARDIAL INFARCTION, ACUTE, NON-Q WAVE 04/26/2009    Qualifier: Diagnosis of  By: Percival Spanish, MD, Farrel Gordon    . Chest pain   . Anemia of chronic disease   . Shortness of breath     "comes up at anytime" (12/26/2012)  . Type II diabetes mellitus     "diet controlled" (12/26/2012)  . ESRD (end stage renal disease) on dialysis     "Industrial; T, Thurs, Sat" (12/26/2012)   Past Surgical History  Procedure Laterality Date  . Arteriovenous graft placement Right 12/2008    "upper arm"  . Coronary artery bypass graft N/A 12/31/2012    Procedure: CORONARY ARTERY BYPASS GRAFTING times four using left internal mammary and right saphenous vein;  Surgeon: Grace Isaac, MD;  Location: [place];  Service: Open Heart Surgery;  Laterality: N/A;  . Intraoperative transesophageal echocardiogram N/A 12/31/2012    Procedure: INTRAOPERATIVE TRANSESOPHAGEAL ECHOCARDIOGRAM;  Surgeon: Grace Isaac, MD;   Location: [place];  Service: Open Heart Surgery;  Laterality: N/A;  . Left heart catheterization with coronary angiogram N/A 12/30/2012    Procedure: LEFT HEART CATHETERIZATION WITH CORONARY ANGIOGRAM;  Surgeon: Burnell Blanks, MD;  Location: [facility] CATH LAB;  Service: Cardiovascular;  Laterality: N/A;   History reviewed. No pertinent family history. History  Substance Use Topics  . Smoking status: Former Smoker -- 1.00 packs/day for 40 years    Types: Cigarettes    Quit date: 06/16/1995  . Smokeless tobacco: Never Used  . Alcohol Use: Yes     Comment: 12/26/2012 "none in 30 yr; drank some when I was young"    Review of Systems  Constitutional: Negative for fever.  Respiratory: Positive for shortness of breath. Negative for cough, hemoptysis and wheezing.   Cardiovascular: Negative for chest pain, palpitations and leg swelling.  Gastrointestinal: Negative for nausea and vomiting.  Musculoskeletal: Negative for myalgias.  Skin: Negative for wound.  Neurological: Negative for dizziness and weakness.  All other systems reviewed and are negative.     Allergies  Review of patient's allergies indicates no known allergies.  Home Medications   Prior to Admission medications   Medication Sig Start Date End Date Taking? Authorizing Provider  aspirin EC 81 MG EC tablet Take 1 tablet (81 mg total) by mouth daily. 01/07/13  Yes Wayne E Gold, PA-C  calcium acetate (PHOSLO) 667 MG capsule Take 2 capsules (1,334 mg total) by mouth 3 (three) times daily  with meals. 01/07/13  Yes Wayne E Gold, PA-C  cinacalcet (SENSIPAR) 30 MG tablet Take 30 mg by mouth daily.   Yes Historical Provider, MD  folic acid-vitamin b complex-vitamin c-selenium-zinc (DIALYVITE) 3 MG TABS tablet Take 1 tablet by mouth at bedtime.   Yes Historical Provider, MD  polyethylene glycol (MIRALAX / GLYCOLAX) packet Take 17 g by mouth daily as needed for moderate constipation.   Yes Historical Provider, MD  sodium chloride  0.9 % SOLN 100 mL with ferric gluconate 12.5 MG/ML SOLN 125 mg Inject 125 mg into the vein Every Tuesday,Thursday,and Saturday with dialysis. 01/07/13  Yes Wayne E Gold, PA-C  amLODipine (NORVASC) 2.5 MG tablet Take 1 tablet (2.5 mg total) by mouth daily. 03/02/14   Minus Breeding, MD   BP 163/52 mmHg  Pulse 72  Temp(Src) 98.3 F (36.8 C) (Oral)  Resp 12  SpO2 100% Physical Exam  Constitutional: He is oriented to person, place, and time. He appears well-developed and well-nourished. No distress.  HENT:  Head: Normocephalic.  Eyes: Pupils are equal, round, and reactive to light.  Neck: Normal range of motion.  Cardiovascular: Normal rate and regular rhythm.   Pulmonary/Chest: Effort normal and breath sounds normal.  Abdominal: Soft. Bowel sounds are normal. He exhibits no distension. There is no tenderness.  Musculoskeletal: Normal range of motion. He exhibits no edema or tenderness.  Neurological: He is alert and oriented to person, place, and time.  Skin: Skin is warm and dry. No rash noted. He is not diaphoretic. No pallor.  Nursing note and vitals reviewed.   ED Course  Procedures (including critical care time) Labs Review Labs Reviewed  CBC WITH DIFFERENTIAL/PLATELET - Abnormal; Notable for the following:    WBC 3.4 (*)    RBC 2.26 (*)    Hemoglobin 6.6 (*)    HCT 21.9 (*)    RDW 16.6 (*)    All other components within normal limits  I-STAT CHEM 8, ED - Abnormal; Notable for the following:    Potassium 3.3 (*)    Chloride 95 (*)    Creatinine, Ser 5.10 (*)    Calcium, Ion 1.09 (*)    Hemoglobin 7.5 (*)    HCT 22.0 (*)    All other components within normal limits  I-STAT TROPOININ, ED  TYPE AND SCREEN  PREPARE RBC (CROSSMATCH)    Imaging Review Dg Chest 2 View  06/11/2014   CLINICAL DATA:  New onset shortness of Breath following dialysis. Recent dry cough. Systolic hypertension.  EXAM: CHEST  2 VIEW  COMPARISON:  01/30/2013  FINDINGS: Mild enlargement of the  cardiopericardial silhouette with tortuosity of the thoracic aorta and atherosclerotic arch calcification. Prior CABG.  Indistinct pulmonary vasculature suggesting pulmonary venous hypertension. Faint interstitial accentuation.  IMPRESSION: 1. Mild pulmonary vascular indistinctness and faint interstitial accentuation, greater than I would expect for the patient having had dialysis today. Appearance is borderline for interstitial edema. There is mild cardiomegaly. 2. Atherosclerotic aortic arch.   Electronically Signed   By: Van Clines M.D.   On: 06/11/2014 22:07     EKG Interpretation   Date/Time:  Thursday June 11 2014 21:26:28 EDT Ventricular Rate:  80 PR Interval:  178 QRS Duration: 104 QT Interval:  426 QTC Calculation: 491 R Axis:   -12 Text Interpretation:  Sinus rhythm LVH with secondary repolarization  abnormality Borderline prolonged QT interval T wave inversions in lateral  leads improved Confirmed by Mingo Amber  MD, [place] ([id number]) on 06/11/2014 9:33:01  PM  Will admit for transfusion  MDM   Final diagnoses:  Shortness of breath  Anemia, unspecified anemia type         Junius Creamer, NP 06/11/14 2332  Evelina Bucy, MD 06/11/14 2350

## 2014-06-11 NOTE — ED Notes (Signed)
Pt placed back on monitor.Pt remains monitored by blood pressure, pulse ox, and 5 lead. pts family remains at bedside.

## 2014-06-11 NOTE — H&P (Signed)
PCP:   [name], MD   Chief Complaint:  sob  HPI: 81 yo male esrd diaysis today, htn, diet controlled dm comes in with 3 weeks of sob, fatigue worse with activity.  No fevers.  No n/v/d.  No melana or brbpr.  Has not required blood transfusion in past that he knows of.  Has been on dialysis for 5 years and getting iron infusions with dialysis.  No chest pain.  With worsening anemia, asked to obs for transfusion.  Review of Systems:  Positive and negative as per HPI otherwise all other systems are negative  Past Medical History: Past Medical History  Diagnosis Date  . Hypertension   . Gout   . Dyslipidemia   . Obesity   . Volume overload   . MYOCARDIAL INFARCTION, ACUTE, NON-Q WAVE 04/26/2009    Qualifier: Diagnosis of  By: Percival Spanish, MD, Farrel Gordon    . Chest pain   . Anemia of chronic disease   . Shortness of breath     "comes up at anytime" (12/26/2012)  . Type II diabetes mellitus     "diet controlled" (12/26/2012)  . ESRD (end stage renal disease) on dialysis     "Industrial; T, Thurs, Sat" (12/26/2012)   Past Surgical History  Procedure Laterality Date  . Arteriovenous graft placement Right 12/2008    "upper arm"  . Coronary artery bypass graft N/A 12/31/2012    Procedure: CORONARY ARTERY BYPASS GRAFTING times four using left internal mammary and right saphenous vein;  Surgeon: Grace Isaac, MD;  Location: [place];  Service: Open Heart Surgery;  Laterality: N/A;  . Intraoperative transesophageal echocardiogram N/A 12/31/2012    Procedure: INTRAOPERATIVE TRANSESOPHAGEAL ECHOCARDIOGRAM;  Surgeon: Grace Isaac, MD;  Location: [place];  Service: Open Heart Surgery;  Laterality: N/A;  . Left heart catheterization with coronary angiogram N/A 12/30/2012    Procedure: LEFT HEART CATHETERIZATION WITH CORONARY ANGIOGRAM;  Surgeon: Burnell Blanks, MD;  Location: [facility] CATH LAB;  Service: Cardiovascular;  Laterality: N/A;    Medications: Prior to Admission  medications   Medication Sig Start Date End Date Taking? Authorizing Provider  aspirin EC 81 MG EC tablet Take 1 tablet (81 mg total) by mouth daily. 01/07/13  Yes Wayne E Gold, PA-C  calcium acetate (PHOSLO) 667 MG capsule Take 2 capsules (1,334 mg total) by mouth 3 (three) times daily with meals. 01/07/13  Yes Wayne E Gold, PA-C  cinacalcet (SENSIPAR) 30 MG tablet Take 30 mg by mouth daily.   Yes Historical Provider, MD  folic acid-vitamin b complex-vitamin c-selenium-zinc (DIALYVITE) 3 MG TABS tablet Take 1 tablet by mouth at bedtime.   Yes Historical Provider, MD  polyethylene glycol (MIRALAX / GLYCOLAX) packet Take 17 g by mouth daily as needed for moderate constipation.   Yes Historical Provider, MD  sodium chloride 0.9 % SOLN 100 mL with ferric gluconate 12.5 MG/ML SOLN 125 mg Inject 125 mg into the vein Every Tuesday,Thursday,and Saturday with dialysis. 01/07/13  Yes Wayne E Gold, PA-C  amLODipine (NORVASC) 2.5 MG tablet Take 1 tablet (2.5 mg total) by mouth daily. 03/02/14   Minus Breeding, MD    Allergies:  No Known Allergies  Social History:  reports that he quit smoking about 19 years ago. His smoking use included Cigarettes. He has a 40 pack-year smoking history. He has never used smokeless tobacco. He reports that he drinks alcohol. He reports that he does not use illicit drugs.  Family History: No esrd  Physical Exam:  Filed Vitals:   06/11/14 2247 06/11/14 2315 06/11/14 2330 06/11/14 2345  BP: 163/52 160/46 158/58 152/52  Pulse: 72 72 74 69  Temp:      TempSrc:      Resp: 12 17 13 18   SpO2: 100% 99% 99% 99%   General appearance: alert, cooperative and no distress Head: Normocephalic, without obvious abnormality, atraumatic Eyes: negative Nose: Nares normal. Septum midline. Mucosa normal. No drainage or sinus tenderness. Neck: no JVD and supple, symmetrical, trachea midline Lungs: clear to auscultation bilaterally Heart: regular rate and rhythm, S1, S2 normal, no  murmur, click, rub or gallop Abdomen: soft, non-tender; bowel sounds normal; no masses,  no organomegaly Extremities: extremities normal, atraumatic, no cyanosis or edema Pulses: 2+ and symmetric Skin: Skin color, texture, turgor normal. No rashes or lesions Neurologic: Grossly normal  Labs on Admission:   Recent Labs  06/11/14 2238  NA 140  K 3.3*  CL 95*  GLUCOSE 91  BUN 10  CREATININE 5.10*    Recent Labs  06/11/14 2216 06/11/14 2238  WBC 3.4*  --   HGB 6.6* 7.5*  HCT 21.9* 22.0*  MCV 96.9  --   PLT 241  --     Radiological Exams on Admission: Dg Chest 2 View  06/11/2014   CLINICAL DATA:  New onset shortness of Breath following dialysis. Recent dry cough. Systolic hypertension.  EXAM: CHEST  2 VIEW  COMPARISON:  01/30/2013  FINDINGS: Mild enlargement of the cardiopericardial silhouette with tortuosity of the thoracic aorta and atherosclerotic arch calcification. Prior CABG.  Indistinct pulmonary vasculature suggesting pulmonary venous hypertension. Faint interstitial accentuation.  IMPRESSION: 1. Mild pulmonary vascular indistinctness and faint interstitial accentuation, greater than I would expect for the patient having had dialysis today. Appearance is borderline for interstitial edema. There is mild cardiomegaly. 2. Atherosclerotic aortic arch.   Electronically Signed   By: Van Clines M.D.   On: 06/11/2014 22:07    Assessment/Plan  81 yo male with esrd with symptomatic anemia  Principal Problem:   Symptomatic anemia-  Transfuse 2 units prbc over 4 hours each slowly overnight.  Further work up can be done as outpatient with nephrologist.  No overt bleeding.  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  Full code.  Pt seen before midnight Ronnel,RACHAL A 06/11/2014, 11:52 PM

## 2014-06-12 DIAGNOSIS — D649 Anemia, unspecified: Secondary | ICD-10-CM | POA: Diagnosis not present

## 2014-06-12 DIAGNOSIS — Z951 Presence of aortocoronary bypass graft: Secondary | ICD-10-CM | POA: Diagnosis not present

## 2014-06-12 DIAGNOSIS — N186 End stage renal disease: Secondary | ICD-10-CM

## 2014-06-12 DIAGNOSIS — R0602 Shortness of breath: Secondary | ICD-10-CM | POA: Insufficient documentation

## 2014-06-12 LAB — IRON AND TIBC
Iron: 55 ug/dL (ref 45–182)
Saturation Ratios: 19 % (ref 17.9–39.5)
TIBC: 287 ug/dL (ref 250–450)
UIBC: 232 ug/dL

## 2014-06-12 LAB — CBC
HCT: 28.6 % — ABNORMAL LOW (ref 39.0–52.0)
Hemoglobin: 9 g/dL — ABNORMAL LOW (ref 13.0–17.0)
MCH: 29.4 pg (ref 26.0–34.0)
MCHC: 31.5 g/dL (ref 30.0–36.0)
MCV: 93.5 fL (ref 78.0–100.0)
PLATELETS: 251 10*3/uL (ref 150–400)
RBC: 3.06 MIL/uL — AB (ref 4.22–5.81)
RDW: 18.2 % — AB (ref 11.5–15.5)
WBC: 5 10*3/uL (ref 4.0–10.5)

## 2014-06-12 LAB — FERRITIN: FERRITIN: 162 ng/mL (ref 24–336)

## 2014-06-12 LAB — BASIC METABOLIC PANEL
ANION GAP: 11 (ref 5–15)
BUN: 10 mg/dL (ref 6–20)
CALCIUM: 8.8 mg/dL — AB (ref 8.9–10.3)
CHLORIDE: 97 mmol/L — AB (ref 101–111)
CO2: 32 mmol/L (ref 22–32)
CREATININE: 6.27 mg/dL — AB (ref 0.61–1.24)
GFR calc Af Amer: 9 mL/min — ABNORMAL LOW (ref >60)
GFR calc non Af Amer: 7 mL/min — ABNORMAL LOW (ref >60)
GLUCOSE: 96 mg/dL (ref 65–99)
Potassium: 3.6 mmol/L (ref 3.5–5.1)
Sodium: 140 mmol/L (ref 135–145)

## 2014-06-12 LAB — RETICULOCYTES
RBC.: 3.06 MIL/uL — ABNORMAL LOW (ref 4.22–5.81)
RETIC CT PCT: 3.3 % — AB (ref 0.4–3.1)
Retic Count, Absolute: 101 10*3/uL (ref 19.0–186.0)

## 2014-06-12 LAB — FOLATE: Folate: 17 ng/mL (ref >5.9)

## 2014-06-12 LAB — GLUCOSE, CAPILLARY: Glucose-Capillary: 107 mg/dL — ABNORMAL HIGH (ref 65–99)

## 2014-06-12 LAB — VITAMIN B12: VITAMIN B 12: 392 pg/mL (ref 180–914)

## 2014-06-12 MED ORDER — ASPIRIN EC 81 MG PO TBEC
81.0000 mg | DELAYED_RELEASE_TABLET | Freq: Every day | ORAL | Status: DC
  Administered 2014-06-12: 81 mg via ORAL
  Filled 2014-06-12 (×2): qty 1

## 2014-06-12 MED ORDER — SODIUM CHLORIDE 0.9 % IV SOLN
Freq: Once | INTRAVENOUS | Status: AC
  Administered 2014-06-12: 02:00:00 via INTRAVENOUS

## 2014-06-12 MED ORDER — POLYETHYLENE GLYCOL 3350 17 G PO PACK
17.0000 g | PACK | Freq: Every day | ORAL | Status: DC | PRN
  Filled 2014-06-12: qty 1

## 2014-06-12 MED ORDER — SODIUM CHLORIDE 0.9 % IV SOLN: 250.0000 mL | INTRAVENOUS | Status: DC | PRN

## 2014-06-12 MED ORDER — AMLODIPINE BESYLATE 2.5 MG PO TABS
2.5000 mg | ORAL_TABLET | Freq: Every day | ORAL | Status: DC
  Administered 2014-06-12: 2.5 mg via ORAL
  Filled 2014-06-12 (×2): qty 1

## 2014-06-12 MED ORDER — SODIUM CHLORIDE 0.9 % IJ SOLN: 3.0000 mL | INTRAMUSCULAR | Status: DC | PRN

## 2014-06-12 MED ORDER — SODIUM CHLORIDE 0.9 % IJ SOLN
3.0000 mL | Freq: Two times a day (BID) | INTRAMUSCULAR | Status: DC
  Administered 2014-06-12 – 2014-06-13 (×3): 3 mL via INTRAVENOUS

## 2014-06-12 MED ORDER — CINACALCET HCL 30 MG PO TABS
30.0000 mg | ORAL_TABLET | Freq: Every day | ORAL | Status: DC
  Administered 2014-06-12: 30 mg via ORAL
  Filled 2014-06-12 (×4): qty 1

## 2014-06-12 MED ORDER — SODIUM CHLORIDE 0.9 % IV SOLN
125.0000 mg | INTRAVENOUS | Status: DC
  Administered 2014-06-13: 125 mg via INTRAVENOUS
  Filled 2014-06-12 (×2): qty 10

## 2014-06-12 NOTE — Consult Note (Signed)
[facility] Renal Consultation Note  Indication for Consultation:  Management of ESRD/hemodialysis; anemia, hypertension/volume and secondary hyperparathyroidism  HPI: [name] [name] is a 81 y.o. male admitted with symptomatic anemia. He reports progressive weakness and sob with exertion past week  With wife also noting this osteoporosis labs from [facility] showing hgb 7.3 5/26.<7.0<7.2<7.6 with low iron TSat 12% with no iron given and 225 Mircera 06/02/14 . Saw  GI with Colonscopy "showing 1 ploy last week okay " Fu  For prior colon polys. He reports n o fevers, n/v/d or  melana or brbpr. Now "feel great after transfusion  "ate all brk /  Prior very little intake" Has not missed HD and is compliant with HD.   Past Medical History  Diagnosis Date  . Hypertension   . Gout   . Dyslipidemia   . Obesity   . Volume overload   . MYOCARDIAL INFARCTION, ACUTE, NON-Q WAVE 04/26/2009    Qualifier: Diagnosis of  By: Percival Spanish, MD, Farrel Gordon    . Chest pain   . Anemia of chronic disease   . Shortness of breath     "comes up at anytime" (12/26/2012)  . Type II diabetes mellitus     "diet controlled" (12/26/2012)  . ESRD (end stage renal disease) on dialysis     "Industrial; T, Thurs, Sat" (12/26/2012)    Past Surgical History  Procedure Laterality Date  . Arteriovenous graft placement Right 12/2008    "upper arm"  . Coronary artery bypass graft N/A 12/31/2012    Procedure: CORONARY ARTERY BYPASS GRAFTING times four using left internal mammary and right saphenous vein;  Surgeon: Grace Isaac, MD;  Location: [place];  Service: Open Heart Surgery;  Laterality: N/A;  . Intraoperative transesophageal echocardiogram N/A 12/31/2012    Procedure: INTRAOPERATIVE TRANSESOPHAGEAL ECHOCARDIOGRAM;  Surgeon: Grace Isaac, MD;  Location: [place];  Service: Open Heart Surgery;  Laterality: N/A;  . Left heart catheterization with coronary angiogram N/A 12/30/2012    Procedure: LEFT HEART  CATHETERIZATION WITH CORONARY ANGIOGRAM;  Surgeon: Burnell Blanks, MD;  Location: [facility] CATH LAB;  Service: Cardiovascular;  Laterality: N/A;     History reviewed. No pertinent family history.    reports that he quit smoking about 19 years ago. His smoking use included Cigarettes. He has a 40 pack-year smoking history. He has never used smokeless tobacco. He reports that he drinks alcohol. He reports that he does not use illicit drugs.  No Known Allergies  Prior to Admission medications   Medication Sig Start Date End Date Taking? Authorizing Provider  aspirin EC 81 MG EC tablet Take 1 tablet (81 mg total) by mouth daily. 01/07/13  Yes Wayne E Gold, PA-C  calcium acetate (PHOSLO) 667 MG capsule Take 2 capsules (1,334 mg total) by mouth 3 (three) times daily with meals. 01/07/13  Yes Wayne E Gold, PA-C  cinacalcet (SENSIPAR) 30 MG tablet Take 30 mg by mouth daily.   Yes Historical Provider, MD  folic acid-vitamin b complex-vitamin c-selenium-zinc (DIALYVITE) 3 MG TABS tablet Take 1 tablet by mouth at bedtime.   Yes Historical Provider, MD  polyethylene glycol (MIRALAX / GLYCOLAX) packet Take 17 g by mouth daily as needed for moderate constipation.   Yes Historical Provider, MD  sodium chloride 0.9 % SOLN 100 mL with ferric gluconate 12.5 MG/ML SOLN 125 mg Inject 125 mg into the vein Every Tuesday,Thursday,and Saturday with dialysis. 01/07/13  Yes Wayne E Gold, PA-C  amLODipine (NORVASC) 2.5 MG tablet Take  1 tablet (2.5 mg total) by mouth daily. 03/02/14   Minus Breeding, MD     Anti-infectives    None      Results for orders placed or performed during the hospital encounter of 06/11/14 (from the past 48 hour(s))  CBC with Differential     Status: Abnormal   Collection Time: 06/11/14 10:16 PM  Result Value Ref Range   WBC 3.4 (L) 4.0 - 10.5 K/uL   RBC 2.26 (L) 4.22 - 5.81 MIL/uL   Hemoglobin 6.6 (LL) 13.0 - 17.0 g/dL    Comment: REPEATED TO VERIFY CRITICAL RESULT CALLED TO, READ  BACK BY AND VERIFIED WITH: MONTAGUE,G RN 2249 06/11/14 TEETERN    HCT 21.9 (L) 39.0 - 52.0 %   MCV 96.9 78.0 - 100.0 fL   MCH 29.2 26.0 - 34.0 pg   MCHC 30.1 30.0 - 36.0 g/dL   RDW 16.6 (H) 11.5 - 15.5 %   Platelets 241 150 - 400 K/uL  I-stat troponin, ED     Status: None   Collection Time: 06/11/14 10:35 PM  Result Value Ref Range   Troponin i, poc 0.03 0.00 - 0.08 ng/mL   Comment 3            Comment: Due to the release kinetics of cTnI, a negative result within the first hours of the onset of symptoms does not rule out myocardial infarction with certainty. If myocardial infarction is still suspected, repeat the test at appropriate intervals.   I-stat chem 8, ed     Status: Abnormal   Collection Time: 06/11/14 10:38 PM  Result Value Ref Range   Sodium 140 135 - 145 mmol/L   Potassium 3.3 (L) 3.5 - 5.1 mmol/L   Chloride 95 (L) 101 - 111 mmol/L   BUN 10 6 - 20 mg/dL   Creatinine, Ser 5.10 (H) 0.61 - 1.24 mg/dL   Glucose, Bld 91 65 - 99 mg/dL   Calcium, Ion 1.09 (L) 1.13 - 1.30 mmol/L   TCO2 31 0 - 100 mmol/L   Hemoglobin 7.5 (L) 13.0 - 17.0 g/dL   HCT 22.0 (L) 39.0 - 52.0 %  Type and screen     Status: None (Preliminary result)   Collection Time: 06/11/14 11:00 PM  Result Value Ref Range   ABO/RH(D) O POS    Antibody Screen NEG    Sample Expiration 06/14/2014    Unit Number [id number]    Blood Component Type RBC LR PHER2    Unit division 00    Status of Unit ISSUED    Transfusion Status OK TO TRANSFUSE    Crossmatch Result Compatible    Unit Number [id number]    Blood Component Type RBC LR PHER2    Unit division 00    Status of Unit ISSUED    Transfusion Status OK TO TRANSFUSE    Crossmatch Result Compatible   Prepare RBC     Status: None   Collection Time: 06/11/14 11:00 PM  Result Value Ref Range   Order Confirmation ORDER PROCESSED BY BLOOD BANK   Glucose, capillary     Status: Abnormal   Collection Time: 06/12/14  8:01 AM  Result Value Ref Range    Glucose-Capillary 107 (H) 65 - 99 mg/dL  Basic metabolic panel     Status: Abnormal   Collection Time: 06/12/14  9:50 AM  Result Value Ref Range   Sodium 140 135 - 145 mmol/L   Potassium 3.6 3.5 - 5.1 mmol/L   Chloride 97 (  L) 101 - 111 mmol/L   CO2 32 22 - 32 mmol/L   Glucose, Bld 96 65 - 99 mg/dL   BUN 10 6 - 20 mg/dL   Creatinine, Ser 6.27 (H) 0.61 - 1.24 mg/dL   Calcium 8.8 (L) 8.9 - 10.3 mg/dL   GFR calc non Af Amer 7 (L) >60 mL/min   GFR calc Af Amer 9 (L) >60 mL/min    Comment: (NOTE) The eGFR has been calculated using the CKD EPI equation. This calculation has not been validated in all clinical situations. eGFR's persistently <60 mL/min signify possible Chronic Kidney Disease.    Anion gap 11 5 - 15  CBC     Status: Abnormal   Collection Time: 06/12/14  9:50 AM  Result Value Ref Range   WBC 5.0 4.0 - 10.5 K/uL   RBC 3.06 (L) 4.22 - 5.81 MIL/uL   Hemoglobin 9.0 (L) 13.0 - 17.0 g/dL   HCT 28.6 (L) 39.0 - 52.0 %   MCV 93.5 78.0 - 100.0 fL   MCH 29.4 26.0 - 34.0 pg   MCHC 31.5 30.0 - 36.0 g/dL   RDW 18.2 (H) 11.5 - 15.5 %   Platelets 251 150 - 400 K/uL  Reticulocytes     Status: Abnormal   Collection Time: 06/12/14  9:50 AM  Result Value Ref Range   Retic Ct Pct 3.3 (H) 0.4 - 3.1 %   RBC. 3.06 (L) 4.22 - 5.81 MIL/uL   Retic Count, Manual 101.0 19.0 - 186.0 K/uL     ROS:see hpi  Physical Exam: Filed Vitals:   06/12/14 1018  BP: 160/54  Pulse: 75  Temp: 98.1 F (36.7 C)  Resp: 17     General: alert  BM , NAD , Pleasant cooperative OX 4 HEENT: [place], MMM,nonicteric  Neck: supple no jvd Heart: RRR, no rub, mur or gallop Lungs: CTA bilat Nonlabored Abdomen: Soft , NT, ND, pos BS normal Extremities: no pedal edema Skin: warm dry no overt rash, no pedal ulcers Neuro: OX3 , moves all extrem.  Dialysis Access: Pos. Bruit R UA AVF  Dialysis Orders: Center: [facility] on TTs . EDW 90.5 HD Bath 2k, 2.25 ca Time 4hr Heparin 8000. Access RUA AVF  Mircera 231mg q  2weeks (lastgiven 06/02/14) Units IV/HD  Other op labs hgb 7.3 06/09/14 Ca 9.4 phos 3.2 pth 183 On 06/02/14  Assessment/Plan 1.  Symptomatic Anemia = transfusedPRBCS unit since admit Admit RX / no hep HD in am  2. ESRD - TTS hd Vol and k stable No hep with SAT hd  3. Hypertension/volume - bp 160/54 On op meds/ Amlodipine 2.530mhs/ Hd in am Get stand wts 4. Anemia - HGB now up to 8.0 esa for next wk fu am hgb pre hd  5. Metabolic bone disease - sensipar/ vit d/ On binders/ Fu ca /phos  6. Nutrition - Renal carb mod Renal vit  7. CAD- 8. DM type 2 - diet controlled no op meds listed  DaErnest HaberPA-C Ca[phone]/03/2014, 11:01 AM   Pt seen, examined and agree w A/P as above.  RoKelly SplinterD pager [phone]  cell [phone]/03/2014, 3:27 PM

## 2014-06-12 NOTE — Progress Notes (Signed)
[facility] PROGRESS NOTE  [name] [name] [id number] DOB: Jun 22, 1930 DOA: 06/11/2014 PCP: [name], MD  Assessment/Plan: 1. Symptomatic anemia -cause of subacute decompensation unclear, suspect some chronic anemia from underlying from CKD -s/p 2units PRBC, no overt bleeding -post transfusion CBC pending -check hemoccult stool -check anemia panel -colonoscopy 2weeks ago at [facility], will get report; no s/s of upper GI bleed -unclear if getting Epo with HD   2. ESRD -on HD, TTS, renal notified  3. CAD s/p CABG 12/14 -stable  4. HTN -stable, on amlodipine  DVT proph: SCDs  Code Status: Full Code Family Communication: wife at bedside (indicate person spoken with, relationship, and if by phone, the number) Disposition Plan: home when stable   Consultants:  Renal  HPI/Subjective: Feels much better after getting blood  Objective: Filed Vitals:   06/12/14 0708  BP: 133/48  Pulse: 68  Temp: 98 F (36.7 C)  Resp: 18    Intake/Output Summary (Last 24 hours) at 06/12/14 0946 Last data filed at 06/12/14 0705  Gross per 24 hour  Intake    643 ml  Output      0 ml  Net    643 ml   Filed Weights   06/12/14 0017  Weight: 87.998 kg (194 lb)    Exam:   General:  AAOx3, no distress  Cardiovascular: S1S2/RRR  Respiratory: CTAB  Abdomen: soft, NT, Bs present  Musculoskeletal: trace edema  Data Reviewed: Basic Metabolic Panel:  Recent Labs Lab 06/11/14 2238  NA 140  K 3.3*  CL 95*  GLUCOSE 91  BUN 10  CREATININE 5.10*   Liver Function Tests: No results for input(s): AST, ALT, ALKPHOS, BILITOT, PROT, ALBUMIN in the last 168 hours. No results for input(s): LIPASE, AMYLASE in the last 168 hours. No results for input(s): AMMONIA in the last 168 hours. CBC:  Recent Labs Lab 06/11/14 2216 06/11/14 2238  WBC 3.4*  --   HGB 6.6* 7.5*  HCT 21.9* 22.0*  MCV 96.9  --   PLT 241  --    Cardiac Enzymes: No results for input(s):  CKTOTAL, CKMB, CKMBINDEX, TROPONINI in the last 168 hours. BNP (last 3 results) No results for input(s): BNP in the last 8760 hours.  ProBNP (last 3 results) No results for input(s): PROBNP in the last 8760 hours.  CBG:  Recent Labs Lab 06/12/14 0801  GLUCAP 107*    No results found for this or any previous visit (from the past 240 hour(s)).   Studies: Dg Chest 2 View  06/11/2014   CLINICAL DATA:  New onset shortness of Breath following dialysis. Recent dry cough. Systolic hypertension.  EXAM: CHEST  2 VIEW  COMPARISON:  01/30/2013  FINDINGS: Mild enlargement of the cardiopericardial silhouette with tortuosity of the thoracic aorta and atherosclerotic arch calcification. Prior CABG.  Indistinct pulmonary vasculature suggesting pulmonary venous hypertension. Faint interstitial accentuation.  IMPRESSION: 1. Mild pulmonary vascular indistinctness and faint interstitial accentuation, greater than I would expect for the patient having had dialysis today. Appearance is borderline for interstitial edema. There is mild cardiomegaly. 2. Atherosclerotic aortic arch.   Electronically Signed   By: Van Clines M.D.   On: 06/11/2014 22:07    Scheduled Meds: . amLODipine  2.5 mg Oral Daily  . aspirin EC  81 mg Oral Daily  . cinacalcet  30 mg Oral Q breakfast  . [START ON 06/13/2014] ferric gluconate (FERRLECIT/NULECIT) IV  125 mg Intravenous Q T,Th,Sa-HD  . sodium chloride  3 mL Intravenous Q12H  Continuous Infusions:  Antibiotics Given (last 72 hours)    None      Principal Problem:   Symptomatic anemia Active Problems:   Obesity   End-stage renal disease (ESRD)   S/P CABG x 4   Anemia   Shortness of breath    Time spent: 43min    [name]  [facility] Pager [phone]. If 7PM-7AM, please contact night-coverage at [email or web address], password [facility] 06/12/2014, 9:46 AM

## 2014-06-13 DIAGNOSIS — N186 End stage renal disease: Secondary | ICD-10-CM | POA: Diagnosis not present

## 2014-06-13 DIAGNOSIS — D649 Anemia, unspecified: Secondary | ICD-10-CM | POA: Diagnosis not present

## 2014-06-13 LAB — BASIC METABOLIC PANEL
Anion gap: 9 (ref 5–15)
BUN: 19 mg/dL (ref 6–20)
CALCIUM: 8.1 mg/dL — AB (ref 8.9–10.3)
CHLORIDE: 98 mmol/L — AB (ref 101–111)
CO2: 32 mmol/L (ref 22–32)
CREATININE: 8.02 mg/dL — AB (ref 0.61–1.24)
GFR calc non Af Amer: 5 mL/min — ABNORMAL LOW (ref >60)
GFR, EST AFRICAN AMERICAN: 6 mL/min — AB (ref >60)
GLUCOSE: 77 mg/dL (ref 65–99)
Potassium: 3.4 mmol/L — ABNORMAL LOW (ref 3.5–5.1)
Sodium: 139 mmol/L (ref 135–145)

## 2014-06-13 LAB — TYPE AND SCREEN
ABO/RH(D): O POS
Antibody Screen: NEGATIVE
UNIT DIVISION: 0
Unit division: 0

## 2014-06-13 LAB — CBC
HCT: 26.5 % — ABNORMAL LOW (ref 39.0–52.0)
HEMATOCRIT: 24.3 % — AB (ref 39.0–52.0)
HEMOGLOBIN: 7.8 g/dL — AB (ref 13.0–17.0)
HEMOGLOBIN: 8.5 g/dL — AB (ref 13.0–17.0)
MCH: 29.7 pg (ref 26.0–34.0)
MCH: 29.5 pg (ref 26.0–34.0)
MCHC: 32.1 g/dL (ref 30.0–36.0)
MCHC: 32.1 g/dL (ref 30.0–36.0)
MCV: 92.4 fL (ref 78.0–100.0)
MCV: 92 fL (ref 78.0–100.0)
PLATELETS: 217 10*3/uL (ref 150–400)
Platelets: 196 10*3/uL (ref 150–400)
RBC: 2.63 MIL/uL — AB (ref 4.22–5.81)
RBC: 2.88 MIL/uL — ABNORMAL LOW (ref 4.22–5.81)
RDW: 17.5 % — AB (ref 11.5–15.5)
RDW: 17.3 % — ABNORMAL HIGH (ref 11.5–15.5)
WBC: 4 10*3/uL (ref 4.0–10.5)
WBC: 4.1 10*3/uL (ref 4.0–10.5)

## 2014-06-13 LAB — HEPATIC FUNCTION PANEL
ALK PHOS: 48 U/L (ref 38–126)
ALT: 12 U/L — ABNORMAL LOW (ref 17–63)
AST: 19 U/L (ref 15–41)
Albumin: 3 g/dL — ABNORMAL LOW (ref 3.5–5.0)
BILIRUBIN TOTAL: 0.6 mg/dL (ref 0.3–1.2)
TOTAL PROTEIN: 5.7 g/dL — AB (ref 6.5–8.1)

## 2014-06-13 NOTE — Progress Notes (Signed)
Subjective:  No cos [name] [name] hd today   Objective Vital signs in last 24 hours: Filed Vitals:   06/12/14 1700 06/12/14 2100 06/13/14 0447 06/13/14 0859  BP: 161/53 144/54 135/45 169/57  Pulse: 69 67 64 77  Temp: 98.7 F (37.1 C) 98.1 F (36.7 C) 98.5 F (36.9 C) 97.5 F (36.4 C)  TempSrc: Oral Oral Oral Oral  Resp: 22 20 20 18   Height:      Weight:      SpO2: 100% 100% 100% 100%   Weight change:   Physical Exam: General: alert , NAD , Pleasant cooperative  Heart: RRR, no rub, mur or gallop Lungs: CTA bilat Nonlabored Abdomen: Soft , NT, ND, pos BS normal Extremities: no pedal edema Dialysis Access: Pos. Bruit R UA AVF  Dialysis Orders: Center: [facility] on TTs . EDW 90.5 HD Bath 2k, 2.25 ca Time 4hr Heparin 8000. Access RUA AVF  Mircera 21mcg q 2weeks (lastgiven 06/02/14) Units IV/HD  Other op labs hgb 7.3 06/09/14 Ca 9.4 phos 3.2 pth 183 On 06/02/14   Problem/Plan: 1. Symptomatic Anemia = transfusedPRBCS unit since admit sl drop this am  6.6>7.5>9.0 >7.8 this am reck pre hd today / Admit RX /   2. ESRD - TTS hd Vol and k 3.4/ No hep with  hd  3. Hypertension/volume - bp 169/57 On op meds/ Amlodipine 2.5mg  hs/ Hd today  is below his edw  get stand wts Fu wt bp with uf  4. Anemia - HGB as above/ esa for next wk fu am hgb pre hd today  5. Metabolic bone disease - sensipar/ vit d/ On binders/ Fu ca /phos  6. Nutrition - Renal carb mod Renal vit  7. CAD- 8. DM  Type 2 - diet controlled   Ernest Haber, PA-C [facility] Beeper [phone] 06/13/2014,9:59 AM    Pt seen, examined, agree w assess/plan as above with additions as indicated.  Kelly Splinter MD pager [phone]    cell [phone] 06/13/2014, 1:10 PM      Labs: Basic Metabolic Panel:  Recent Labs Lab 06/11/14 2238 06/12/14 0950 06/13/14 0502  NA 140 140 139  K 3.3* 3.6 3.4*  CL 95* 97* 98*  CO2  --  32 32  GLUCOSE 91 96 77  BUN 10 10 19   CREATININE 5.10*  6.27* 8.02*  CALCIUM  --  8.8* 8.1*   Liver Function Tests: No results for input(s): AST, ALT, ALKPHOS, BILITOT, PROT, ALBUMIN in the last 168 hours. No results for input(s): LIPASE, AMYLASE in the last 168 hours. No results for input(s): AMMONIA in the last 168 hours. CBC:  Recent Labs Lab 06/11/14 2216 06/11/14 2238 06/12/14 0950 06/13/14 0502  WBC 3.4*  --  5.0 4.0  HGB 6.6* 7.5* 9.0* 7.8*  HCT 21.9* 22.0* 28.6* 24.3*  MCV 96.9  --  93.5 92.4  PLT 241  --  251 196   Cardiac Enzymes: No results for input(s): CKTOTAL, CKMB, CKMBINDEX, TROPONINI in the last 168 hours. CBG:  Recent Labs Lab 06/12/14 0801  GLUCAP 107*    Studies/Results: Dg Chest 2 View  06/11/2014   CLINICAL DATA:  New onset shortness of Breath following dialysis. Recent dry cough. Systolic hypertension.  EXAM: CHEST  2 VIEW  COMPARISON:  01/30/2013  FINDINGS: Mild enlargement of the cardiopericardial silhouette with tortuosity of the thoracic aorta and atherosclerotic arch calcification. Prior CABG.  Indistinct pulmonary vasculature suggesting pulmonary venous hypertension. Faint interstitial accentuation.  IMPRESSION: 1. Mild pulmonary vascular indistinctness  and faint interstitial accentuation, greater than I would expect for the patient having had dialysis today. Appearance is borderline for interstitial edema. There is mild cardiomegaly. 2. Atherosclerotic aortic arch.   Electronically Signed   By: Van Clines M.D.   On: 06/11/2014 22:07   Medications:   . amLODipine  2.5 mg Oral Daily  . aspirin EC  81 mg Oral Daily  . cinacalcet  30 mg Oral Q breakfast  . ferric gluconate (FERRLECIT/NULECIT) IV  125 mg Intravenous Q T,Th,Sa-HD  . sodium chloride  3 mL Intravenous Q12H     ]

## 2014-06-13 NOTE — Progress Notes (Addendum)
Discharge instructions given. Verbalizes understanding. No complaints. 

## 2014-06-13 NOTE — Progress Notes (Signed)
Discharge home via wheelchair.  

## 2014-06-13 NOTE — Discharge Summary (Signed)
Physician Discharge Summary  [name] [name] [id number] DOB: 09-07-1930 DOA: 06/11/2014  PCP: [name], MD  Admit date: 06/11/2014 Discharge date: 06/13/2014  Time spent: 45 minutes  Recommendations for Outpatient Follow-up:  1. Please check CBC in 1 week at Dialysis  Discharge Diagnoses:  Principal Problem:   Symptomatic anemia Active Problems:   Obesity   End-stage renal disease (ESRD)   S/P CABG x 4   Anemia of chronic disease   Shortness of breath   Discharge Condition: stable  Diet recommendation: Renal  Filed Weights   06/12/14 0017  Weight: 87.998 kg (194 lb)    History of present illness:  Chief Complaint: sob HPI: 81 yo male esrd diaysis , htn, diet controlled dm admitted with 3 weeks of sob, fatigue worse with activity. No fevers. No n/v/d. No melana or brbpr. Has not required blood transfusion in past that he knows of. Has been on dialysis for 5 years and getting iron infusions with dialysis. No chest pain. noted to have worsening anemia  Hospital Course:  1. Symptomatic anemia -cause of subacute decompensation unclear, has chronic anemia from underlying from CKD -s/p 2units PRBC, no overt bleeding -post transfusion Hb 9 and now 8.5 today -hemoccult negative -Anemia panel WNL -colonoscopy 2weeks ago at [facility], unremarkable and no s/s of upper GI bleed -getting IV Iron and Epo with HD  -clinically much improved and anxious to go home, will need  CBC checked in 1 week in HD  2. ESRD -on HD, TTS, renal notified  3. CAD s/p CABG 12/14 -stable  4. HTN -stable, on amlodipine   Procedures:  2 units PRBC transfusion  Consultations:  Renal  Discharge Exam: Filed Vitals:   06/13/14 0859  BP: 169/57  Pulse: 77  Temp: 97.5 F (36.4 C)  Resp: 18    General: AAOx3 Cardiovascular: S1S2/RRR Respiratory: CTAB  Discharge Instructions   Discharge Instructions    Discharge instructions    Complete by:  As directed   Renal Diet      Increase activity slowly    Complete by:  As directed           Current Discharge Medication List    CONTINUE these medications which have NOT CHANGED   Details  aspirin EC 81 MG EC tablet Take 1 tablet (81 mg total) by mouth daily.    calcium acetate (PHOSLO) 667 MG capsule Take 2 capsules (1,334 mg total) by mouth 3 (three) times daily with meals. Qty: 180 capsule, Refills: 1    cinacalcet (SENSIPAR) 30 MG tablet Take 30 mg by mouth daily.    folic acid-vitamin b complex-vitamin c-selenium-zinc (DIALYVITE) 3 MG TABS tablet Take 1 tablet by mouth at bedtime.    polyethylene glycol (MIRALAX / GLYCOLAX) packet Take 17 g by mouth daily as needed for moderate constipation.    sodium chloride 0.9 % SOLN 100 mL with ferric gluconate 12.5 MG/ML SOLN 125 mg Inject 125 mg into the vein Every Tuesday,Thursday,and Saturday with dialysis.    amLODipine (NORVASC) 2.5 MG tablet Take 1 tablet (2.5 mg total) by mouth daily. Qty: 180 tablet, Refills: 3       No Known Allergies    The results of significant diagnostics from this hospitalization (including imaging, microbiology, ancillary and laboratory) are listed below for reference.    Significant Diagnostic Studies: Dg Chest 2 View  06/11/2014   CLINICAL DATA:  New onset shortness of Breath following dialysis. Recent dry cough. Systolic hypertension.  EXAM: CHEST  2  VIEW  COMPARISON:  01/30/2013  FINDINGS: Mild enlargement of the cardiopericardial silhouette with tortuosity of the thoracic aorta and atherosclerotic arch calcification. Prior CABG.  Indistinct pulmonary vasculature suggesting pulmonary venous hypertension. Faint interstitial accentuation.  IMPRESSION: 1. Mild pulmonary vascular indistinctness and faint interstitial accentuation, greater than I would expect for the patient having had dialysis today. Appearance is borderline for interstitial edema. There is mild cardiomegaly. 2. Atherosclerotic aortic arch.   Electronically  Signed   By: Van Clines M.D.   On: 06/11/2014 22:07    Microbiology: No results found for this or any previous visit (from the past 240 hour(s)).   Labs: Basic Metabolic Panel:  Recent Labs Lab 06/11/14 2238 06/12/14 0950 06/13/14 0502  NA 140 140 139  K 3.3* 3.6 3.4*  CL 95* 97* 98*  CO2  --  32 32  GLUCOSE 91 96 77  BUN 10 10 19   CREATININE 5.10* 6.27* 8.02*  CALCIUM  --  8.8* 8.1*   Liver Function Tests:  Recent Labs Lab 06/13/14 0935  AST 19  ALT 12*  ALKPHOS 48  BILITOT 0.6  PROT 5.7*  ALBUMIN 3.0*   No results for input(s): LIPASE, AMYLASE in the last 168 hours. No results for input(s): AMMONIA in the last 168 hours. CBC:  Recent Labs Lab 06/11/14 2216 06/11/14 2238 06/12/14 0950 06/13/14 0502 06/13/14 0935  WBC 3.4*  --  5.0 4.0 4.1  HGB 6.6* 7.5* 9.0* 7.8* 8.5*  HCT 21.9* 22.0* 28.6* 24.3* 26.5*  MCV 96.9  --  93.5 92.4 92.0  PLT 241  --  251 196 217   Cardiac Enzymes: No results for input(s): CKTOTAL, CKMB, CKMBINDEX, TROPONINI in the last 168 hours. BNP: BNP (last 3 results) No results for input(s): BNP in the last 8760 hours.  ProBNP (last 3 results) No results for input(s): PROBNP in the last 8760 hours.  CBG:  Recent Labs Lab 06/12/14 0801  GLUCAP 107*       Signed:  Marrissa Dai  [facility] 06/13/2014, 11:09 AM

## 2014-06-14 LAB — HAPTOGLOBIN: Haptoglobin: 136 mg/dL (ref 34–200)

## 2014-08-15 ENCOUNTER — Encounter (HOSPITAL_COMMUNITY): Payer: Self-pay | Admitting: Emergency Medicine

## 2014-08-15 ENCOUNTER — Observation Stay (HOSPITAL_COMMUNITY)
Admission: EM | Admit: 2014-08-15 | Discharge: 2014-08-16 | Disposition: A | Discharge status: Home / Self Care | Payer: Medicare Other | Attending: Internal Medicine | Admitting: Internal Medicine

## 2014-08-15 DIAGNOSIS — I25810 Atherosclerosis of coronary artery bypass graft(s) without angina pectoris: Secondary | ICD-10-CM | POA: Diagnosis present

## 2014-08-15 DIAGNOSIS — I120 Hypertensive chronic kidney disease with stage 5 chronic kidney disease or end stage renal disease: Secondary | ICD-10-CM | POA: Diagnosis not present

## 2014-08-15 DIAGNOSIS — Z992 Dependence on renal dialysis: Secondary | ICD-10-CM | POA: Diagnosis not present

## 2014-08-15 DIAGNOSIS — N186 End stage renal disease: Secondary | ICD-10-CM | POA: Diagnosis present

## 2014-08-15 DIAGNOSIS — D649 Anemia, unspecified: Secondary | ICD-10-CM | POA: Diagnosis not present

## 2014-08-15 DIAGNOSIS — Z951 Presence of aortocoronary bypass graft: Secondary | ICD-10-CM

## 2014-08-15 DIAGNOSIS — E785 Hyperlipidemia, unspecified: Secondary | ICD-10-CM | POA: Diagnosis not present

## 2014-08-15 DIAGNOSIS — N185 Chronic kidney disease, stage 5: Secondary | ICD-10-CM | POA: Diagnosis not present

## 2014-08-15 DIAGNOSIS — E119 Type 2 diabetes mellitus without complications: Secondary | ICD-10-CM | POA: Insufficient documentation

## 2014-08-15 DIAGNOSIS — Z87891 Personal history of nicotine dependence: Secondary | ICD-10-CM | POA: Diagnosis not present

## 2014-08-15 DIAGNOSIS — I252 Old myocardial infarction: Secondary | ICD-10-CM | POA: Diagnosis not present

## 2014-08-15 DIAGNOSIS — D631 Anemia in chronic kidney disease: Secondary | ICD-10-CM | POA: Diagnosis not present

## 2014-08-15 DIAGNOSIS — I2510 Atherosclerotic heart disease of native coronary artery without angina pectoris: Secondary | ICD-10-CM | POA: Insufficient documentation

## 2014-08-15 DIAGNOSIS — Z7982 Long term (current) use of aspirin: Secondary | ICD-10-CM | POA: Diagnosis not present

## 2014-08-15 DIAGNOSIS — N189 Chronic kidney disease, unspecified: Secondary | ICD-10-CM

## 2014-08-15 LAB — CBC
HCT: 23.5 % — ABNORMAL LOW (ref 39.0–52.0)
HEMOGLOBIN: 7.2 g/dL — AB (ref 13.0–17.0)
MCH: 29.3 pg (ref 26.0–34.0)
MCHC: 30.6 g/dL (ref 30.0–36.0)
MCV: 95.5 fL (ref 78.0–100.0)
Platelets: 228 10*3/uL (ref 150–400)
RBC: 2.46 MIL/uL — ABNORMAL LOW (ref 4.22–5.81)
RDW: 16.5 % — AB (ref 11.5–15.5)
WBC: 5.3 10*3/uL (ref 4.0–10.5)

## 2014-08-15 LAB — BASIC METABOLIC PANEL
ANION GAP: 7 (ref 5–15)
BUN: 8 mg/dL (ref 6–20)
CHLORIDE: 93 mmol/L — AB (ref 101–111)
CO2: 36 mmol/L — ABNORMAL HIGH (ref 22–32)
CREATININE: 3.75 mg/dL — AB (ref 0.61–1.24)
Calcium: 8.8 mg/dL — ABNORMAL LOW (ref 8.9–10.3)
GFR calc Af Amer: 16 mL/min — ABNORMAL LOW (ref >60)
GFR calc non Af Amer: 14 mL/min — ABNORMAL LOW (ref >60)
Glucose, Bld: 93 mg/dL (ref 65–99)
Potassium: 3.3 mmol/L — ABNORMAL LOW (ref 3.5–5.1)
Sodium: 136 mmol/L (ref 135–145)

## 2014-08-15 LAB — PREPARE RBC (CROSSMATCH)

## 2014-08-15 LAB — GLUCOSE, CAPILLARY
Glucose-Capillary: 86 mg/dL (ref 65–99)
Glucose-Capillary: 126 mg/dL — ABNORMAL HIGH (ref 65–99)

## 2014-08-15 MED ORDER — POLYETHYLENE GLYCOL 3350 17 G PO PACK: 17.0000 g | PACK | Freq: Every day | ORAL | Status: DC | PRN

## 2014-08-15 MED ORDER — SODIUM CHLORIDE 0.9 % IV SOLN
Freq: Once | INTRAVENOUS | Status: AC
Start: 2014-08-15 — End: 2014-08-15
  Administered 2014-08-15: 14:00:00 via INTRAVENOUS

## 2014-08-15 MED ORDER — ACETAMINOPHEN 650 MG RE SUPP: 650.0000 mg | Freq: Four times a day (QID) | RECTAL | Status: DC | PRN

## 2014-08-15 MED ORDER — AMLODIPINE BESYLATE 2.5 MG PO TABS
2.5000 mg | ORAL_TABLET | Freq: Every day | ORAL | Status: DC
  Administered 2014-08-16: 2.5 mg via ORAL
  Filled 2014-08-15: qty 1

## 2014-08-15 MED ORDER — ACETAMINOPHEN 325 MG PO TABS
325.0000 mg | ORAL_TABLET | Freq: Once | ORAL | Status: AC
Start: 2014-08-15 — End: 2014-08-15
  Administered 2014-08-15: 325 mg via ORAL
  Filled 2014-08-15: qty 1

## 2014-08-15 MED ORDER — ACETAMINOPHEN 325 MG PO TABS: 650.0000 mg | ORAL_TABLET | Freq: Four times a day (QID) | ORAL | Status: DC | PRN

## 2014-08-15 MED ORDER — CARBOXYMETHYLCELLULOSE SODIUM 1 % OP SOLN: 1.0000 [drp] | OPHTHALMIC | Status: DC | PRN

## 2014-08-15 MED ORDER — DIPHENHYDRAMINE HCL 50 MG/ML IJ SOLN
25.0000 mg | INTRAMUSCULAR | Status: DC | PRN
  Administered 2014-08-15: 25 mg via INTRAVENOUS
  Filled 2014-08-15: qty 1

## 2014-08-15 MED ORDER — HYPROMELLOSE (GONIOSCOPIC) 2.5 % OP SOLN
1.0000 [drp] | OPHTHALMIC | Status: DC | PRN
  Filled 2014-08-15: qty 15

## 2014-08-15 MED ORDER — SODIUM CHLORIDE 0.9 % IV SOLN: 250.0000 mL | INTRAVENOUS | Status: DC | PRN

## 2014-08-15 MED ORDER — CALCIUM ACETATE (PHOS BINDER) 667 MG PO CAPS
1334.0000 mg | ORAL_CAPSULE | Freq: Three times a day (TID) | ORAL | Status: DC
  Administered 2014-08-15 – 2014-08-16 (×2): 1334 mg via ORAL
  Filled 2014-08-15 (×3): qty 2

## 2014-08-15 MED ORDER — SODIUM CHLORIDE 0.9 % IJ SOLN
3.0000 mL | Freq: Two times a day (BID) | INTRAMUSCULAR | Status: DC
  Administered 2014-08-15 – 2014-08-16 (×2): 3 mL via INTRAVENOUS

## 2014-08-15 MED ORDER — CINACALCET HCL 30 MG PO TABS
30.0000 mg | ORAL_TABLET | Freq: Every day | ORAL | Status: DC
  Administered 2014-08-15: 30 mg via ORAL
  Filled 2014-08-15: qty 1

## 2014-08-15 MED ORDER — CALCIUM ACETATE (PHOS BINDER) 667 MG PO CAPS: 667.0000 mg | ORAL_CAPSULE | ORAL | Status: DC | PRN

## 2014-08-15 MED ORDER — SODIUM CHLORIDE 0.9 % IJ SOLN: 3.0000 mL | INTRAMUSCULAR | Status: DC | PRN

## 2014-08-15 NOTE — H&P (Signed)
History and Physical  [name] [name] [id number] DOB: 15-Oct-1930 DOA: 08/15/2014  Referring physician: Dr. Jola Schmidt in ED PCP: [name], MD   Chief Complaint: low hemoglobin  HPI:  33yom PMH ESRD, anemia of CKD underwent HD today and Hgb noted to be 7.1. Nephrologist Dr. Arty Baumgartner recommended transfer to ED for transfusion 2 units PRBC. Hgb confirmed 7.2.  Patient reports feeling well lately. Today he felt a little bit tired and slow on the way dialysis. No chest pain or shortness breath. No bleeding. Reported mild lightheadedness. No focal weakness. No other complaints.  Admitted 06/2014 for symptomatic anemia, was transfused 2 units PRBC, no overt bleeding noted. Hemoccult was negative at thime and anemia panel was unremarkable. According to chart: "colonoscopy 2weeks ago at [facility] GI, unremarkable and no s/s of upper GI bleed. Getting IV Iron and Epo with HD "  In the emergency department afebrile, VSS, no hypoxia Pertinent labs: Hgb 7.2, K+ 3.3. BMP unremarkable.  Review of Systems:  Negative for fever, visual changes, sore throat, rash, new muscle aches, chest pain, SOB, dysuria, bleeding, n/v/abdominal pain.  Past Medical History  Diagnosis Date  . Hypertension   . Gout   . Dyslipidemia   . Obesity   . Volume overload   . MYOCARDIAL INFARCTION, ACUTE, NON-Q WAVE 04/26/2009    Qualifier: Diagnosis of  By: [name] Spanish, MD, [name] [name]    . Chest pain   . Anemia of chronic disease   . Shortness of breath     "comes up at anytime" (12/26/2012)  . Type II diabetes mellitus     "diet controlled" (12/26/2012)  . ESRD (end stage renal disease) on dialysis     "[employer]; T, Thurs, Sat" (12/26/2012)    Past Surgical History  Procedure Laterality Date  . Arteriovenous graft placement Right 12/2008    "upper arm"  . Coronary artery bypass graft N/A 12/31/2012    Procedure: CORONARY ARTERY BYPASS GRAFTING times four using left internal mammary and right saphenous  vein;  Surgeon: [name] Isaac, MD;  Location: [place];  Service: Open Heart Surgery;  Laterality: N/A;  . Intraoperative transesophageal echocardiogram N/A 12/31/2012    Procedure: INTRAOPERATIVE TRANSESOPHAGEAL ECHOCARDIOGRAM;  Surgeon: [name] Isaac, MD;  Location: [place];  Service: Open Heart Surgery;  Laterality: N/A;  . Left heart catheterization with coronary angiogram N/A 12/30/2012    Procedure: LEFT HEART CATHETERIZATION WITH CORONARY ANGIOGRAM;  Surgeon: [name] Blanks, MD;  Location: [facility] CATH LAB;  Service: Cardiovascular;  Laterality: N/A;    Social History:  reports that he quit smoking about 19 years ago. His smoking use included Cigarettes. He has a 40 pack-year smoking history. He has never used smokeless tobacco. He reports that he drinks alcohol. He reports that he does not use illicit drugs. lives with their spouse Self-care  No Known Allergies  Family History  Problem Relation Age of Onset  . Stroke Mother   . Diabetes Sister      Prior to Admission medications   Medication Sig Start Date End Date Taking? Authorizing Provider  amLODipine (NORVASC) 2.5 MG tablet Take 1 tablet (2.5 mg total) by mouth daily. 03/02/14  Yes Minus Breeding, MD  aspirin EC 81 MG EC tablet Take 1 tablet (81 mg total) by mouth daily. 01/07/13  Yes [name] Gold, PA-C  calcium acetate (PHOSLO) 667 MG capsule Take 2 capsules (1,334 mg total) by mouth 3 (three) times daily with meals. Patient taking differently: Take 667-1,334 mg by mouth See  admin instructions. Take 2 capsules (1334 mg) with meals and 1 capsule (667 mg) with snacks or a sandwich 01/07/13  Yes [name] Gold, PA-C  cinacalcet (SENSIPAR) 30 MG tablet Take 30 mg by mouth at bedtime.    Yes Historical Provider, MD  folic acid-vitamin b complex-vitamin c-selenium-zinc (DIALYVITE) 3 MG TABS tablet Take 1 tablet by mouth at bedtime.   Yes Historical Provider, MD  polyethylene glycol (MIRALAX / GLYCOLAX) packet Take 17 g by mouth  daily as needed for moderate constipation.   Yes Historical Provider, MD  Polyvinyl Alcohol-Povidone (REFRESH OP) Place 1 drop into both eyes daily as needed (dry eyes).   Yes Historical Provider, MD  sodium chloride 0.9 % SOLN 100 mL with ferric gluconate 12.5 MG/ML SOLN 125 mg Inject 125 mg into the vein Every Tuesday,Thursday,and Saturday with dialysis. 01/07/13   [name] Giovanni, PA-C   Physical Exam: Filed Vitals:   08/15/14 1130 08/15/14 1145 08/15/14 1200 08/15/14 1215  BP: 142/59 138/55 137/54 139/54  Pulse:  87 76 74  Temp:      TempSrc:      Resp: 18 17 18 19   Height:      Weight:      SpO2: 100% 98% 98% 100%    General: Appears calm and comfortable Eyes: PERRL, normal lids, irises  ENT: grossly normal hearing, lips & tongue Neck: There is grossly unremarkable. Cardiovascular: RRR, no m/r/g.  Respiratory: CTA bilaterally, no w/r/r. Normal respiratory effort. Abdomen: soft, ntnd Skin: no rash or induration seen on limited exam Musculoskeletal: grossly normal tone BUE/BLE Psychiatric: grossly normal mood and affect, speech fluent and appropriate Neurologic: grossly non-focal.  Wt Readings from Last 3 Encounters:  08/15/14 91.627 kg (202 lb)  06/13/14 90.9 kg (200 lb 6.4 oz)  03/02/14 96.163 kg (212 lb)    Labs on Admission:  Basic Metabolic Panel:  Recent Labs Lab 08/15/14 1153  NA 136  K 3.3*  CL 93*  CO2 36*  GLUCOSE 93  BUN 8  CREATININE 3.75*  CALCIUM 8.8*   CBC:  Recent Labs Lab 08/15/14 1153  WBC 5.3  HGB 7.2*  HCT 23.5*  MCV 95.5  PLT 228    Principal Problem:   Symptomatic anemia Active Problems:   CAD (coronary artery disease) of artery bypass graft   End-stage renal disease (ESRD)   S/P CABG x 4   Anemia of chronic kidney failure   Assessment/Plan 1. Anemia of CKD, acute on chronic. Anemia panel 06/2014 unremarkable. Colonoscopy within the last few months reportedly negative. No upper GI symptoms. 2. ESRD on HD TTS. S/p full HD  today. Potassium slightly low. No indication for nephrology consultation at this point. 3. HTN, stable. 4. DM type 2, diet-controlled. Random blood sugar 93. AG 7. 5. CAD, s/p CABG 2014; asymptomatic.   We will proceed with observation and transfusion as per nephrology recommendations.  Transfuse 2 units PRBC per nephrology's recommendations, premedicate with Benadryl and Tylenol, give each unit over 3 hours.  Given previous evaluation will not pursue any further inpatient evaluation at this time. His anemia is most likely related to chronic kidney disease.  Anticipate discharge 8/7.  Discussed with wife at bedside.  Code Status: full code  DVT prophylaxis: SCDs Family Communication:  Disposition Plan/Anticipated LOS: observation. Home.  Time spent: 45 minutes  Murray Hodgkins, MD  [facility] Pager [phone] 08/15/2014, 1:39 PM

## 2014-08-15 NOTE — ED Provider Notes (Signed)
CSN: [id number]     Arrival date & time 08/15/14  1116 History   First MD Initiated Contact with Patient 08/15/14 1123     Chief Complaint  Patient presents with  . Abnormal Lab      HPI Patient is complaining of mild lightheadedness today and was noted to have a hemoglobin of 7.1 at dialysis.  He is required blood transfusions before in the past.  He has a history of chronic anemia.  He denies black or bloody stools.  No significant shortness of breath.  He had a full run of dialysis today.  He was sent to the emergency department for blood transfusion with orders from his nephrologist to transfuse if his hemoglobin is less than 7.4.  His hemoglobin today is 7.2.  Patient is otherwise without significant symptoms.   Past Medical History  Diagnosis Date  . Hypertension   . Gout   . Dyslipidemia   . Obesity   . Volume overload   . MYOCARDIAL INFARCTION, ACUTE, NON-Q WAVE 04/26/2009    Qualifier: Diagnosis of  By: Percival Spanish, MD, Farrel Gordon    . Chest pain   . Anemia of chronic disease   . Shortness of breath     "comes up at anytime" (12/26/2012)  . Type II diabetes mellitus     "diet controlled" (12/26/2012)  . ESRD (end stage renal disease) on dialysis     "Industrial; T, Thurs, Sat" (12/26/2012)   Past Surgical History  Procedure Laterality Date  . Arteriovenous graft placement Right 12/2008    "upper arm"  . Coronary artery bypass graft N/A 12/31/2012    Procedure: CORONARY ARTERY BYPASS GRAFTING times four using left internal mammary and right saphenous vein;  Surgeon: Grace Isaac, MD;  Location: [place];  Service: Open Heart Surgery;  Laterality: N/A;  . Intraoperative transesophageal echocardiogram N/A 12/31/2012    Procedure: INTRAOPERATIVE TRANSESOPHAGEAL ECHOCARDIOGRAM;  Surgeon: Grace Isaac, MD;  Location: [place];  Service: Open Heart Surgery;  Laterality: N/A;  . Left heart catheterization with coronary angiogram N/A 12/30/2012    Procedure: LEFT HEART  CATHETERIZATION WITH CORONARY ANGIOGRAM;  Surgeon: Burnell Blanks, MD;  [facility] C[facility] LAB;  Service: Cardiovascular;  Laterality: N/A;   No family history on file. History  Substance Use Topics  . Smoking status: Former Smoker -- 1.00 packs/day for 40 years    Types: Cigarettes    Quit date: 06/16/1995  . Smokeless tobacco: Never Used  . Alcohol Use: Yes     Comment: 12/26/2012 "none in 30 yr; drank some when I was young"    Review of Systems  All other systems reviewed and are negative.     Allergies  Review of patient's allergies indicates no known allergies.  Home Medications   Prior to Admission medications   Medication Sig Start Date End Date Taking? Authorizing Provider  amLODipine (NORVASC) 2.5 MG tablet Take 1 tablet (2.5 mg total) by mouth daily. 03/02/14  Yes Minus Breeding, MD  aspirin EC 81 MG EC tablet Take 1 tablet (81 mg total) by mouth daily. 01/07/13  Yes Wayne E Gold, PA-C  calcium acetate (PHOSLO) 667 MG capsule Take 2 capsules (1,334 mg total) by mouth 3 (three) times daily with meals. Patient taking differently: Take 667-1,334 mg by mouth See admin instructions. Take 2 capsules (1334 mg) with meals and 1 capsule (667 mg) with snacks or a sandwich 01/07/13  Yes Wayne E Gold, PA-C  cinacalcet (SENSIPAR) 30 MG tablet Take  30 mg by mouth at bedtime.    Yes Historical Provider, MD  folic acid-vitamin b complex-vitamin c-selenium-zinc (DIALYVITE) 3 MG TABS tablet Take 1 tablet by mouth at bedtime.   Yes Historical Provider, MD  polyethylene glycol (MIRALAX / GLYCOLAX) packet Take 17 g by mouth daily as needed for moderate constipation.   Yes Historical Provider, MD  Polyvinyl Alcohol-Povidone (REFRESH OP) Place 1 drop into both eyes daily as needed (dry eyes).   Yes Historical Provider, MD  sodium chloride 0.9 % SOLN 100 mL with ferric gluconate 12.5 MG/ML SOLN 125 mg Inject 125 mg into the vein Every Tuesday,Thursday,and Saturday with dialysis. 01/07/13    Wayne E Gold, PA-C   BP 139/54 mmHg  Pulse 74  Temp(Src) 97.7 F (36.5 C) (Oral)  Resp 19  Ht 6\' 2"  (1.88 m)  Wt 202 lb (91.627 kg)  BMI 25.92 kg/m2  SpO2 100% Physical Exam  Constitutional: He is oriented to person, place, and time. He appears well-developed and well-nourished.  HENT:  Head: Normocephalic.  Eyes: EOM are normal.  Neck: Normal range of motion.  Cardiovascular: Normal rate.   Pulmonary/Chest: Effort normal. No respiratory distress.  Abdominal: He exhibits no distension.  Musculoskeletal: Normal range of motion.  Neurological: He is alert and oriented to person, place, and time.  Psychiatric: He has a normal mood and affect.  Nursing note and vitals reviewed.   ED Course  Procedures (including critical care time) Labs Review Labs Reviewed  CBC - Abnormal; Notable for the following:    RBC 2.46 (*)    Hemoglobin 7.2 (*)    HCT 23.5 (*)    RDW 16.5 (*)    All other components within normal limits  BASIC METABOLIC PANEL - Abnormal; Notable for the following:    Potassium 3.3 (*)    Chloride 93 (*)    CO2 36 (*)    Creatinine, Ser 3.75 (*)    Calcium 8.8 (*)    GFR calc non Af Amer 14 (*)    GFR calc Af Amer 16 (*)    All other components within normal limits  TYPE AND SCREEN  PREPARE RBC (CROSSMATCH)    Imaging Review No results found.   EKG Interpretation None      MDM   Final diagnoses:  Anemia, unspecified anemia type    Per nephrology orders will transfuse the patient 2 units of blood.  Per the nephrology ordered there is recommendation for premedication and 25 mg IV Benadryl and 325 mg of Tylenol prior to transfusion.  It's recommended that each transfusion be given over 3 hours.    Jola Schmidt, MD 08/15/14 [phone]

## 2014-08-15 NOTE — ED Notes (Signed)
Had dialysis today, full treatment.  Sent to ED because of low hemoglobin, which was 7.1.  C/o a little lightheadedness.  VSS.

## 2014-08-16 DIAGNOSIS — N185 Chronic kidney disease, stage 5: Secondary | ICD-10-CM

## 2014-08-16 DIAGNOSIS — N186 End stage renal disease: Secondary | ICD-10-CM

## 2014-08-16 DIAGNOSIS — D649 Anemia, unspecified: Secondary | ICD-10-CM

## 2014-08-16 DIAGNOSIS — D631 Anemia in chronic kidney disease: Secondary | ICD-10-CM

## 2014-08-16 LAB — TYPE AND SCREEN
ABO/RH(D): O POS
ANTIBODY SCREEN: NEGATIVE
UNIT DIVISION: 0
Unit division: 0

## 2014-08-16 LAB — CBC
HEMATOCRIT: 26.6 % — AB (ref 39.0–52.0)
Hemoglobin: 8.3 g/dL — ABNORMAL LOW (ref 13.0–17.0)
MCH: 29 pg (ref 26.0–34.0)
MCHC: 31.2 g/dL (ref 30.0–36.0)
MCV: 93 fL (ref 78.0–100.0)
Platelets: 202 10*3/uL (ref 150–400)
RBC: 2.86 MIL/uL — ABNORMAL LOW (ref 4.22–5.81)
RDW: 17.9 % — AB (ref 11.5–15.5)
WBC: 4.8 10*3/uL (ref 4.0–10.5)

## 2014-08-16 LAB — GLUCOSE, CAPILLARY
GLUCOSE-CAPILLARY: 86 mg/dL (ref 65–99)
Glucose-Capillary: 84 mg/dL (ref 65–99)

## 2014-08-16 MED ORDER — CALCIUM ACETATE 667 MG PO CAPS: 667.0000 mg | ORAL_CAPSULE | ORAL | Status: DC

## 2014-08-16 NOTE — Discharge Summary (Signed)
Physician Discharge Summary  [name] [name] [id number] DOB: 1930-03-06 DOA: 08/15/2014  PCP: [name], MD  Admit date: 08/15/2014 Discharge date: 08/16/2014  Time spent: 25 minutes  Recommendations for Outpatient Follow-up:  1. Follow up with dialysis on 8/9 as previously scheduled  Discharge Diagnoses:  Principal Problem:   Symptomatic anemia Active Problems:   CAD (coronary artery disease) of artery bypass graft   End-stage renal disease (ESRD)   S/P CABG x 4   Anemia of chronic kidney failure   Discharge Condition: improved  Diet recommendation: low salt  Filed Weights   08/15/14 1129 08/16/14 0552  Weight: 91.627 kg (202 lb) 90.719 kg (200 lb)    History of present illness:  This patient was admitted to the hospital after he was found have a low hemoglobin during his dialysis session. He is under hemoglobin 7.1 reported feeling weak, lightheaded and generally fatigued. He recently had a GI workup with colonoscopy approximately 2 weeks prior to admission. He does have a history of anemia of chronic disease related to renal failure and is chronically on Epogen injections. Per recommendations from nephrology, he was admitted for transfusion of PRBCs.  Hospital Course:  Patient was submitted to the hospital and transfused 2 units of PRBCs. His hemoglobin did improve to 8.3 after transfusion. The patient was feeling significantly improved. He was no longer feeling fatigued, lightheaded or weak. He does not have any evidence of bleeding and his anemia is likely related to chronic disease. He will follow up with his dialysis center on 8/9 as previously scheduled and can have a repeat CBC at that time. Currently, he does not meet criteria for further transfusions. The patient is otherwise stable for discharge home today.  Procedures:    Consultations:    Discharge Exam: Filed Vitals:   08/16/14 1033  BP: 151/60  Pulse:   Temp:   Resp:     General:  NAd Cardiovascular: S1, S2 RRR Respiratory: CTA B  Discharge Instructions   Discharge Instructions    Diet - low sodium heart healthy    Complete by:  As directed      Increase activity slowly    Complete by:  As directed           Discharge Medication List as of 08/16/2014 12:19 PM    CONTINUE these medications which have CHANGED   Details  calcium acetate (PHOSLO) 667 MG capsule Take 1-2 capsules (667-1,334 mg total) by mouth See admin instructions. Take 2 capsules (1334 mg) with meals and 1 capsule (667 mg) with snacks or a sandwich, Starting 08/16/2014, Until Discontinued, Print      CONTINUE these medications which have NOT CHANGED   Details  amLODipine (NORVASC) 2.5 MG tablet Take 1 tablet (2.5 mg total) by mouth daily., Starting 03/02/2014, Until Discontinued, Normal    aspirin EC 81 MG EC tablet Take 1 tablet (81 mg total) by mouth daily., Starting 01/07/2013, Until Discontinued, OTC    cinacalcet (SENSIPAR) 30 MG tablet Take 30 mg by mouth at bedtime. , Until Discontinued, Historical Med    folic acid-vitamin b complex-vitamin c-selenium-zinc (DIALYVITE) 3 MG TABS tablet Take 1 tablet by mouth at bedtime., Until Discontinued, Historical Med    polyethylene glycol (MIRALAX / GLYCOLAX) packet Take 17 g by mouth daily as needed for moderate constipation., Until Discontinued, Historical Med    Polyvinyl Alcohol-Povidone (REFRESH OP) Place 1 drop into both eyes daily as needed (dry eyes)., Until Discontinued, Historical Med    sodium chloride 0.9 %  SOLN 100 mL with ferric gluconate 12.5 MG/ML SOLN 125 mg Inject 125 mg into the vein Every Tuesday,Thursday,and Saturday with dialysis., Starting 01/07/2013, Until Discontinued, No Print       No Known Allergies Follow-up Information    Follow up with follow up at dialysis on 8/9 as scheduled.       The results of significant diagnostics from this hospitalization (including imaging, microbiology, ancillary and laboratory) are  listed below for reference.    Significant Diagnostic Studies: No results found.  Microbiology: No results found for this or any previous visit (from the past 240 hour(s)).   Labs: Basic Metabolic Panel:  Recent Labs Lab 08/15/14 1153  NA 136  K 3.3*  CL 93*  CO2 36*  GLUCOSE 93  BUN 8  CREATININE 3.75*  CALCIUM 8.8*   Liver Function Tests: No results for input(s): AST, ALT, ALKPHOS, BILITOT, PROT, ALBUMIN in the last 168 hours. No results for input(s): LIPASE, AMYLASE in the last 168 hours. No results for input(s): AMMONIA in the last 168 hours. CBC:  Recent Labs Lab 08/15/14 1153 08/16/14 0623  WBC 5.3 4.8  HGB 7.2* 8.3*  HCT 23.5* 26.6*  MCV 95.5 93.0  PLT 228 202   Cardiac Enzymes: No results for input(s): CKTOTAL, CKMB, CKMBINDEX, TROPONINI in the last 168 hours. BNP: BNP (last 3 results) No results for input(s): BNP in the last 8760 hours.  ProBNP (last 3 results) No results for input(s): PROBNP in the last 8760 hours.  CBG:  Recent Labs Lab 08/15/14 1623 08/15/14 2141 08/16/14 0613 08/16/14 1106  GLUCAP 86 126* 84 86       Signed:  MEMON,JEHANZEB  [facility] 08/16/2014, 6:33 PM

## 2014-08-16 NOTE — Progress Notes (Signed)
Patient and wife given discharge instructions and prescription.  Patient and wife deny any questions.  IV removed from patient.  Patient escorted via wheelchair to wife's vehicle by [place], [place].

## 2014-10-09 ENCOUNTER — Telehealth: Payer: Self-pay | Admitting: *Deleted

## 2014-10-09 NOTE — Telephone Encounter (Signed)
Signed ordr was faxed back to [facility] as per Dr Percival Spanish " ok to hold aspirin as needed" pt hah Endoscopy on 09/27/14

## 2014-12-18 ENCOUNTER — Other Ambulatory Visit: Payer: Self-pay | Admitting: Gastroenterology

## 2014-12-18 DIAGNOSIS — K921 Melena: Secondary | ICD-10-CM

## 2014-12-18 DIAGNOSIS — D500 Iron deficiency anemia secondary to blood loss (chronic): Secondary | ICD-10-CM

## 2014-12-18 DIAGNOSIS — R634 Abnormal weight loss: Secondary | ICD-10-CM

## 2014-12-23 ENCOUNTER — Ambulatory Visit
Admission: RE | Admit: 2014-12-23 | Discharge: 2014-12-23 | Disposition: A | Discharge status: Home / Self Care | Payer: Medicare Other | Source: Ambulatory Visit | Attending: *Deleted | Admitting: *Deleted

## 2014-12-23 ENCOUNTER — Other Ambulatory Visit: Payer: Self-pay | Admitting: *Deleted

## 2014-12-23 DIAGNOSIS — R634 Abnormal weight loss: Secondary | ICD-10-CM

## 2014-12-28 ENCOUNTER — Ambulatory Visit
Admission: RE | Admit: 2014-12-28 | Discharge: 2014-12-28 | Disposition: A | Discharge status: Home / Self Care | Payer: Medicare Other | Source: Ambulatory Visit | Attending: Gastroenterology | Admitting: Gastroenterology

## 2014-12-28 DIAGNOSIS — D500 Iron deficiency anemia secondary to blood loss (chronic): Secondary | ICD-10-CM

## 2014-12-28 DIAGNOSIS — K921 Melena: Secondary | ICD-10-CM

## 2014-12-28 DIAGNOSIS — R634 Abnormal weight loss: Secondary | ICD-10-CM

## 2015-01-05 ENCOUNTER — Other Ambulatory Visit: Payer: Self-pay | Admitting: Urology

## 2015-01-05 DIAGNOSIS — C61 Malignant neoplasm of prostate: Secondary | ICD-10-CM

## 2015-01-18 ENCOUNTER — Encounter (HOSPITAL_COMMUNITY)
Admission: RE | Admit: 2015-01-18 | Discharge: 2015-01-18 | Disposition: A | Discharge status: Home / Self Care | Payer: Medicare Other | Source: Ambulatory Visit | Attending: Urology | Admitting: Urology

## 2015-01-18 DIAGNOSIS — C61 Malignant neoplasm of prostate: Secondary | ICD-10-CM | POA: Diagnosis present

## 2015-01-18 MED ORDER — FLUDEOXYGLUCOSE F - 18 (FDG) INJECTION
25.6000 | Freq: Once | INTRAVENOUS | Status: AC | PRN
  Administered 2015-01-18: 25.6 via INTRAVENOUS

## 2015-01-19 ENCOUNTER — Encounter (HOSPITAL_COMMUNITY): Payer: Medicare Other

## 2015-01-19 ENCOUNTER — Ambulatory Visit (HOSPITAL_COMMUNITY): Payer: Medicare Other

## 2015-03-04 ENCOUNTER — Other Ambulatory Visit: Payer: Self-pay | Admitting: Cardiology

## 2015-05-29 ENCOUNTER — Encounter (HOSPITAL_COMMUNITY): Payer: Self-pay

## 2015-05-29 ENCOUNTER — Observation Stay (HOSPITAL_COMMUNITY)
Admission: EM | Admit: 2015-05-29 | Discharge: 2015-05-30 | Disposition: A | Discharge status: Home / Self Care | Payer: Medicare Other | Attending: Internal Medicine | Admitting: Internal Medicine

## 2015-05-29 DIAGNOSIS — Z87891 Personal history of nicotine dependence: Secondary | ICD-10-CM | POA: Insufficient documentation

## 2015-05-29 DIAGNOSIS — I252 Old myocardial infarction: Secondary | ICD-10-CM | POA: Diagnosis not present

## 2015-05-29 DIAGNOSIS — Z951 Presence of aortocoronary bypass graft: Secondary | ICD-10-CM | POA: Diagnosis not present

## 2015-05-29 DIAGNOSIS — I120 Hypertensive chronic kidney disease with stage 5 chronic kidney disease or end stage renal disease: Secondary | ICD-10-CM | POA: Diagnosis not present

## 2015-05-29 DIAGNOSIS — N186 End stage renal disease: Secondary | ICD-10-CM

## 2015-05-29 DIAGNOSIS — D649 Anemia, unspecified: Principal | ICD-10-CM | POA: Insufficient documentation

## 2015-05-29 DIAGNOSIS — R195 Other fecal abnormalities: Secondary | ICD-10-CM

## 2015-05-29 DIAGNOSIS — R7989 Other specified abnormal findings of blood chemistry: Secondary | ICD-10-CM | POA: Diagnosis present

## 2015-05-29 DIAGNOSIS — Z7982 Long term (current) use of aspirin: Secondary | ICD-10-CM | POA: Insufficient documentation

## 2015-05-29 DIAGNOSIS — E669 Obesity, unspecified: Secondary | ICD-10-CM | POA: Diagnosis not present

## 2015-05-29 DIAGNOSIS — Z79899 Other long term (current) drug therapy: Secondary | ICD-10-CM | POA: Diagnosis not present

## 2015-05-29 DIAGNOSIS — E1122 Type 2 diabetes mellitus with diabetic chronic kidney disease: Secondary | ICD-10-CM | POA: Insufficient documentation

## 2015-05-29 DIAGNOSIS — E785 Hyperlipidemia, unspecified: Secondary | ICD-10-CM | POA: Diagnosis not present

## 2015-05-29 DIAGNOSIS — M109 Gout, unspecified: Secondary | ICD-10-CM | POA: Diagnosis not present

## 2015-05-29 LAB — COMPREHENSIVE METABOLIC PANEL
ALT: 23 U/L (ref 17–63)
AST: 31 U/L (ref 15–41)
Albumin: 3.1 g/dL — ABNORMAL LOW (ref 3.5–5.0)
Alkaline Phosphatase: 152 U/L — ABNORMAL HIGH (ref 38–126)
Anion gap: 11 (ref 5–15)
BILIRUBIN TOTAL: 0.6 mg/dL (ref 0.3–1.2)
BUN: 12 mg/dL (ref 6–20)
CO2: 31 mmol/L (ref 22–32)
CREATININE: 3.39 mg/dL — AB (ref 0.61–1.24)
Calcium: 8.8 mg/dL — ABNORMAL LOW (ref 8.9–10.3)
Chloride: 98 mmol/L — ABNORMAL LOW (ref 101–111)
GFR, EST AFRICAN AMERICAN: 18 mL/min — AB (ref >60)
GFR, EST NON AFRICAN AMERICAN: 15 mL/min — AB (ref >60)
Glucose, Bld: 137 mg/dL — ABNORMAL HIGH (ref 65–99)
Potassium: 3.2 mmol/L — ABNORMAL LOW (ref 3.5–5.1)
Sodium: 140 mmol/L (ref 135–145)
TOTAL PROTEIN: 6.8 g/dL (ref 6.5–8.1)

## 2015-05-29 LAB — CBC
HEMATOCRIT: 24.8 % — AB (ref 39.0–52.0)
Hemoglobin: 7.5 g/dL — ABNORMAL LOW (ref 13.0–17.0)
MCH: 28.5 pg (ref 26.0–34.0)
MCHC: 30.2 g/dL (ref 30.0–36.0)
MCV: 94.3 fL (ref 78.0–100.0)
PLATELETS: 249 10*3/uL (ref 150–400)
RBC: 2.63 MIL/uL — ABNORMAL LOW (ref 4.22–5.81)
RDW: 16.8 % — AB (ref 11.5–15.5)
WBC: 4.3 10*3/uL (ref 4.0–10.5)

## 2015-05-29 LAB — PREPARE RBC (CROSSMATCH)

## 2015-05-29 LAB — GLUCOSE, CAPILLARY: Glucose-Capillary: 117 mg/dL — ABNORMAL HIGH (ref 65–99)

## 2015-05-29 MED ORDER — POLYVINYL ALCOHOL 1.4 % OP SOLN
1.0000 [drp] | Freq: Two times a day (BID) | OPHTHALMIC | Status: DC | PRN
  Filled 2015-05-29: qty 15

## 2015-05-29 MED ORDER — SODIUM CHLORIDE 0.9% FLUSH: 3.0000 mL | Freq: Two times a day (BID) | INTRAVENOUS | Status: DC

## 2015-05-29 MED ORDER — SODIUM CHLORIDE 0.9% FLUSH: 3.0000 mL | INTRAVENOUS | Status: DC | PRN

## 2015-05-29 MED ORDER — SODIUM CHLORIDE 0.9 % IV SOLN: Freq: Once | INTRAVENOUS | Status: DC

## 2015-05-29 MED ORDER — SODIUM CHLORIDE 0.9 % IV SOLN: 250.0000 mL | INTRAVENOUS | Status: DC | PRN

## 2015-05-29 MED ORDER — CALCIUM ACETATE (PHOS BINDER) 667 MG PO CAPS
1334.0000 mg | ORAL_CAPSULE | Freq: Three times a day (TID) | ORAL | Status: DC
  Administered 2015-05-30: 1334 mg via ORAL
  Filled 2015-05-29: qty 2

## 2015-05-29 MED ORDER — RENA-VITE PO TABS
1.0000 | ORAL_TABLET | Freq: Every day | ORAL | Status: DC
  Administered 2015-05-29: 1 via ORAL
  Filled 2015-05-29: qty 1

## 2015-05-29 MED ORDER — CALCIUM ACETATE (PHOS BINDER) 667 MG PO CAPS: 667.0000 mg | ORAL_CAPSULE | ORAL | Status: DC | PRN

## 2015-05-29 MED ORDER — INSULIN ASPART 100 UNIT/ML ~~LOC~~ SOLN: 0.0000 [IU] | Freq: Every day | SUBCUTANEOUS | Status: DC

## 2015-05-29 MED ORDER — AMLODIPINE BESYLATE 5 MG PO TABS
2.5000 mg | ORAL_TABLET | Freq: Every day | ORAL | Status: DC
  Administered 2015-05-30: 2.5 mg via ORAL
  Filled 2015-05-29: qty 1

## 2015-05-29 MED ORDER — POLYETHYLENE GLYCOL 3350 17 G PO PACK: 17.0000 g | PACK | Freq: Every day | ORAL | Status: DC | PRN

## 2015-05-29 MED ORDER — SODIUM CHLORIDE 0.9% FLUSH
3.0000 mL | Freq: Two times a day (BID) | INTRAVENOUS | Status: DC
Start: 2015-05-29 — End: 2015-05-30
  Administered 2015-05-29: 3 mL via INTRAVENOUS

## 2015-05-29 MED ORDER — HEPARIN SODIUM (PORCINE) 5000 UNIT/ML IJ SOLN
5000.0000 [IU] | Freq: Three times a day (TID) | INTRAMUSCULAR | Status: DC
  Administered 2015-05-29 – 2015-05-30 (×2): 5000 [IU] via SUBCUTANEOUS
  Filled 2015-05-29 (×2): qty 1

## 2015-05-29 MED ORDER — ASPIRIN EC 81 MG PO TBEC
81.0000 mg | DELAYED_RELEASE_TABLET | Freq: Every day | ORAL | Status: DC
  Administered 2015-05-30: 81 mg via ORAL
  Filled 2015-05-29: qty 1

## 2015-05-29 MED ORDER — CARBOXYMETHYLCELLULOSE SODIUM 1 % OP SOLN: 1.0000 [drp] | Freq: Two times a day (BID) | OPHTHALMIC | Status: DC | PRN

## 2015-05-29 MED ORDER — CALCIUM ACETATE 667 MG PO CAPS: 667.0000 mg | ORAL_CAPSULE | ORAL | Status: DC

## 2015-05-29 MED ORDER — CINACALCET HCL 30 MG PO TABS: 30.0000 mg | ORAL_TABLET | Freq: Every day | ORAL | Status: DC

## 2015-05-29 MED ORDER — ACETAMINOPHEN 650 MG RE SUPP: 650.0000 mg | Freq: Four times a day (QID) | RECTAL | Status: DC | PRN

## 2015-05-29 MED ORDER — ACETAMINOPHEN 325 MG PO TABS: 650.0000 mg | ORAL_TABLET | Freq: Four times a day (QID) | ORAL | Status: DC | PRN

## 2015-05-29 MED ORDER — DIALYVITE 3000 3 MG PO TABS: 1.0000 | ORAL_TABLET | Freq: Every day | ORAL | Status: DC

## 2015-05-29 MED ORDER — INSULIN ASPART 100 UNIT/ML ~~LOC~~ SOLN: 0.0000 [IU] | Freq: Three times a day (TID) | SUBCUTANEOUS | Status: DC

## 2015-05-29 NOTE — Progress Notes (Signed)
Arrival Method: via stretcher Mental Status: alert and oriented x 4 Telemetry: applied, CMD notified Skin: intact, some old scabs on bilateral legs.  Tubes: n/a IV: LFA, blood infusing, R arm HD access Pain: denies Family: wife at bedside Living Situation: home with wife  Safety Measures: bed in lowest position, call bell in reach, non skid socks on 6E Orientation: oriented to staff and unit

## 2015-05-29 NOTE — ED Notes (Signed)
Blood consent form signed by patient.

## 2015-05-29 NOTE — ED Provider Notes (Signed)
CSN: [id number]     Arrival date & time 05/29/15  1157 History   First MD Initiated Contact with Patient 05/29/15 1407     Chief Complaint  Patient presents with  . Abnormal Lab    hgb 7.1      HPI  Patient presents for evaluation of an anemia. She has history of end-stage renal disease on 3 times weekly maintenance HD, Tuesday, Thursday, Saturday. Has required a transfusion for symptomatically anemia twice. June, and August 2016.  States he gets short of breath if he walks "a ways", not simply with ADLs. Not dizzy or lightheaded.  States that he was at his 75 office on Thursday of this week and was called and told that the "stool card showed blood". He states his stools are brown likely always are. No frank melena or BRBPR.  During his admission in June of last year he had had colonoscopy 2 weeks prior at [place] that was unremarkable. Is getting IV iron and Depo with HD.    Past Medical History  Diagnosis Date  . Hypertension   . Gout   . Dyslipidemia   . Obesity   . Volume overload   . MYOCARDIAL INFARCTION, ACUTE, NON-Q WAVE 04/26/2009    Qualifier: Diagnosis of  By: Percival Spanish, MD, Farrel Gordon    . Chest pain   . Anemia of chronic disease   . Shortness of breath     "comes up at anytime" (12/26/2012)  . Type II diabetes mellitus ([place])     "diet controlled" (12/26/2012)  . ESRD (end stage renal disease) on dialysis [facility])     "Industrial; T, [place], Sat" (12/26/2012)   Past Surgical History  Procedure Laterality Date  . Arteriovenous graft placement Right 12/2008    "upper arm"  . Coronary artery bypass graft N/A 12/31/2012    Procedure: CORONARY ARTERY BYPASS GRAFTING times four using left internal mammary and right saphenous vein;  Surgeon: Grace Isaac, MD;  Location: [place];  Service: Open Heart Surgery;  Laterality: N/A;  . Intraoperative transesophageal echocardiogram N/A 12/31/2012    Procedure: INTRAOPERATIVE TRANSESOPHAGEAL ECHOCARDIOGRAM;  Surgeon:  Grace Isaac, MD;  Location: [place];  Service: Open Heart Surgery;  Laterality: N/A;  . Left heart catheterization with coronary angiogram N/A 12/30/2012    Procedure: LEFT HEART CATHETERIZATION WITH CORONARY ANGIOGRAM;  Surgeon: Burnell Blanks, MD;  Location: [facility] CATH LAB;  Service: Cardiovascular;  Laterality: N/A;   Family History  Problem Relation Age of Onset  . Stroke Mother   . Diabetes Sister    Social History  Substance Use Topics  . Smoking status: Former Smoker -- 1.00 packs/day for 40 years    Types: Cigarettes    Quit date: 06/16/1995  . Smokeless tobacco: Never Used  . Alcohol Use: Yes     Comment: 12/26/2012 "none in 30 yr; drank some when I was young"    Review of Systems  Constitutional: Positive for fatigue. Negative for fever, chills, diaphoresis and appetite change.  HENT: Negative for mouth sores, sore throat and trouble swallowing.   Eyes: Negative for visual disturbance.  Respiratory: Negative for cough, chest tightness, shortness of breath and wheezing.   Cardiovascular: Negative for chest pain.  Gastrointestinal: Negative for nausea, vomiting, abdominal pain, diarrhea and abdominal distention.  Endocrine: Negative for polydipsia, polyphagia and polyuria.  Genitourinary: Negative for dysuria, frequency and hematuria.  Musculoskeletal: Negative for gait problem.  Skin: Negative for color change, pallor and rash.  Neurological: Positive  for weakness. Negative for dizziness, syncope, light-headedness and headaches.  Hematological: Does not bruise/bleed easily.  Psychiatric/Behavioral: Negative for behavioral problems and confusion.      Allergies  Review of patient's allergies indicates no known allergies.  Home Medications   Prior to Admission medications   Medication Sig Start Date End Date Taking? Authorizing Provider  amLODipine (NORVASC) 2.5 MG tablet TAKE 1 TABLET BY MOUTH DAILY 03/04/15   Minus Breeding, MD  aspirin EC 81 MG EC tablet  Take 1 tablet (81 mg total) by mouth daily. 01/07/13   Wayne E Gold, PA-C  calcium acetate (PHOSLO) 667 MG capsule Take 1-2 capsules (667-1,334 mg total) by mouth See admin instructions. Take 2 capsules (1334 mg) with meals and 1 capsule (667 mg) with snacks or a sandwich 08/16/14   Kathie Dike, MD  cinacalcet (SENSIPAR) 30 MG tablet Take 30 mg by mouth at bedtime.     Historical Provider, MD  folic acid-vitamin b complex-vitamin c-selenium-zinc (DIALYVITE) 3 MG TABS tablet Take 1 tablet by mouth at bedtime.    Historical Provider, MD  polyethylene glycol (MIRALAX / GLYCOLAX) packet Take 17 g by mouth daily as needed for moderate constipation.    Historical Provider, MD  Polyvinyl Alcohol-Povidone (REFRESH OP) Place 1 drop into both eyes daily as needed (dry eyes).    Historical Provider, MD  sodium chloride 0.9 % SOLN 100 mL with ferric gluconate 12.5 MG/ML SOLN 125 mg Inject 125 mg into the vein Every Tuesday,Thursday,and Saturday with dialysis. 01/07/13   Wayne E Gold, PA-C   BP 120/47 mmHg  Pulse 72  Temp(Src) 97.7 F (36.5 C) (Oral)  Resp 20  Ht 6\' 1"  (1.854 m)  Wt 183 lb 12.8 oz (83.371 kg)  BMI 24.25 kg/m2  SpO2 100% Physical Exam  Constitutional: He is oriented to person, place, and time. He appears well-developed and well-nourished. No distress.  Awake and alert. Oriented lucid.  HENT:  Head: Normocephalic.  Eyes: Conjunctivae are normal. Pupils are equal, round, and reactive to light. No scleral icterus.  Conjunctiva slightly pale. Anicteric.  Neck: Normal range of motion. Neck supple. No thyromegaly present.  Cardiovascular: Normal rate and regular rhythm.  Exam reveals no gallop and no friction rub.   No murmur heard. Pulmonary/Chest: Effort normal and breath sounds normal. No respiratory distress. He has no wheezes. He has no rales.  Abdominal: Soft. Bowel sounds are normal. He exhibits no distension. There is no tenderness. There is no rebound.  Musculoskeletal: Normal  range of motion.  Neurological: He is alert and oriented to person, place, and time.  Skin: Skin is warm and dry. No rash noted.  Psychiatric: He has a normal mood and affect. His behavior is normal.    ED Course  Procedures (including critical care time) Labs Review Labs Reviewed  CBC - Abnormal; Notable for the following:    RBC 2.63 (*)    Hemoglobin 7.5 (*)    HCT 24.8 (*)    RDW 16.8 (*)    All other components within normal limits  COMPREHENSIVE METABOLIC PANEL - Abnormal; Notable for the following:    Potassium 3.2 (*)    Chloride 98 (*)    Glucose, Bld 137 (*)    Creatinine, Ser 3.39 (*)    Calcium 8.8 (*)    Albumin 3.1 (*)    Alkaline Phosphatase 152 (*)    GFR calc non Af Amer 15 (*)    GFR calc Af Amer 18 (*)    All  other components within normal limits  TYPE AND SCREEN  PREPARE RBC (CROSSMATCH)    Imaging Review No results found. I have personally reviewed and evaluated these images and lab results as part of my medical decision-making.   EKG Interpretation None      MDM   Final diagnoses:  Symptomatic anemia    Essentially, patient really gets anemic and requires transfusion. Minimally symptomatic now. Currently normocytic with MCV 94.3.  We'll discussed with hospitalist regarding obs admit for transfusion.   Tanna Furry, MD 05/29/15 1501

## 2015-05-29 NOTE — ED Notes (Signed)
MD at bedside. 

## 2015-05-29 NOTE — H&P (Addendum)
TRH H&P   Patient Demographics:    [name] [name], is a 82 y.o. male  MRN: [id number]   DOB - Oct 13, 1930  Admit Date - 05/29/2015  Outpatient Primary MD for the patient is [name], MD  Referring MD/NP/PA:  Dialysis , Tanna Furry  Outpatient Specialists: Erling Cruz (nephrology)  Patient coming from: Dialysis  Chief Complaint  Patient presents with  . Abnormal Lab    hgb 7.1      HPI:    [name] [name]  is a 82 y.o. male, CAD s/p CABG, ESRD on HD T, T, S, Prostate cancer, Anemia, apparently felt more dyspnea and went to [facility] (on [address])  and had labs, and given hemoccult (Tuesday), and was called today and notifed that hgb low.  Pt states his stool is brown.  Denies fever, chills, abd pain, diarrhea, brbpr, black stool.  Pt came to ED and Hgb 7.5  (down from 8.3)  Pt has dyspnea with exertion. Pt will be admitted for observation and transfusion.      Review of systems:    In addition to the HPI above,   No Fever-chills, No Headache, No changes with Vision or hearing, No problems swallowing food or Liquids, No Chest pain, Cough.  + Shortness of Breath, No Abdominal pain, No Nausea or Vommitting, Bowel movements are regular, No Blood in stool or Urine, No dysuria, No new skin rashes or bruises, No new joints pains-aches,  No new weakness, tingling, numbness in any extremity, No recent weight gain or loss, No polyuria, polydypsia or polyphagia, No significant Mental Stressors.  A full 10 point Review of Systems was done, except as stated above, all other Review of Systems were negative.   With Past History of the following :    Past Medical History  Diagnosis Date  . Hypertension   . Gout   . Dyslipidemia   . Obesity   . Volume overload   . MYOCARDIAL INFARCTION, ACUTE, NON-Q WAVE 04/26/2009    Qualifier: Diagnosis of  By: Percival Spanish, MD, Farrel Gordon      . Chest pain   . Anemia of chronic disease   . Shortness of breath     "comes up at anytime" (12/26/2012)  . Type II diabetes mellitus ([place])     "diet controlled" (12/26/2012)  . ESRD (end stage renal disease) on dialysis [facility])     "Industrial; T, [place], Sat" (12/26/2012)      Past Surgical History  Procedure Laterality Date  . Arteriovenous graft placement Right 12/2008    "upper arm"  . Coronary artery bypass graft N/A 12/31/2012    Procedure: CORONARY ARTERY BYPASS GRAFTING times four using left internal mammary and right saphenous vein;  Surgeon: Grace Isaac, MD;  Location: [place];  Service: Open Heart Surgery;  Laterality: N/A;  . Intraoperative transesophageal echocardiogram N/A 12/31/2012    Procedure: INTRAOPERATIVE TRANSESOPHAGEAL  ECHOCARDIOGRAM;  Surgeon: Grace Isaac, MD;  Location: [place];  Service: Open Heart Surgery;  Laterality: N/A;  . Left heart catheterization with coronary angiogram N/A 12/30/2012    Procedure: LEFT HEART CATHETERIZATION WITH CORONARY ANGIOGRAM;  Surgeon: Burnell Blanks, MD;  Location: [facility] CATH LAB;  Service: Cardiovascular;  Laterality: N/A;      Social History:     Social History  Substance Use Topics  . Smoking status: Former Smoker -- 1.00 packs/day for 40 years    Types: Cigarettes    Quit date: 06/16/1995  . Smokeless tobacco: Never Used  . Alcohol Use: No     Comment: 12/26/2012 "none in 30 yr; drank some when I was young"     Lives - at home  Mobility - walks without cane    Family History :     Family History  Problem Relation Age of Onset  . Stroke Mother   . Diabetes Sister        Home Medications:   Prior to Admission medications   Medication Sig Start Date End Date Taking? Authorizing Provider  amLODipine (NORVASC) 2.5 MG tablet TAKE 1 TABLET BY MOUTH DAILY 03/04/15   Minus Breeding, MD  aspirin EC 81 MG EC tablet Take 1 tablet (81 mg total) by mouth daily. 01/07/13   Wayne E Gold, PA-C  calcium  acetate (PHOSLO) 667 MG capsule Take 1-2 capsules (667-1,334 mg total) by mouth See admin instructions. Take 2 capsules (1334 mg) with meals and 1 capsule (667 mg) with snacks or a sandwich 08/16/14   Kathie Dike, MD  cinacalcet (SENSIPAR) 30 MG tablet Take 30 mg by mouth at bedtime.     Historical Provider, MD  folic acid-vitamin b complex-vitamin c-selenium-zinc (DIALYVITE) 3 MG TABS tablet Take 1 tablet by mouth at bedtime.    Historical Provider, MD  polyethylene glycol (MIRALAX / GLYCOLAX) packet Take 17 g by mouth daily as needed for moderate constipation.    Historical Provider, MD  Polyvinyl Alcohol-Povidone (REFRESH OP) Place 1 drop into both eyes daily as needed (dry eyes).    Historical Provider, MD  sodium chloride 0.9 % SOLN 100 mL with ferric gluconate 12.5 MG/ML SOLN 125 mg Inject 125 mg into the vein Every Tuesday,Thursday,and Saturday with dialysis. 01/07/13   John Giovanni, PA-C     Allergies:    No Known Allergies   Physical Exam:   Vitals  Blood pressure 120/47, pulse 72, temperature 97.7 F (36.5 C), temperature source Oral, resp. rate 20, height 6\' 1"  (1.854 m), weight 83.371 kg (183 lb 12.8 oz), SpO2 100 %.   1. General elderly african american male lying in bed in NAD,    2. Normal affect and insight, Not Suicidal or Homicidal, Awake Alert, Oriented X 3.  3. No F.N deficits, ALL C.Nerves Intact, Strength 5/5 all 4 extremities, Sensation intact all 4 extremities, Plantars down going.  4. Ears and Eyes appear Normal, Conjunctivae clear, PERRLA. Moist Oral Mucosa.  5. Supple Neck, No JVD, No cervical lymphadenopathy appriciated, No Carotid Bruits.  6. Symmetrical Chest wall movement, Good air movement bilaterally, CTAB.  7. RRR, No Gallops, Rubs or Murmurs, No Parasternal Heave.  8. Positive Bowel Sounds, Abdomen Soft, No tenderness, No organomegaly appriciated,No rebound -guarding or rigidity.  9.  No Cyanosis, Normal Skin Turgor, No Skin Rash or  Bruise.  10. Good muscle tone,  joints appear normal , no effusions, Normal ROM.  11. No Palpable Lymph Nodes in Neck or Axillae  Data Review:    CBC  Recent Labs Lab 05/29/15 1215  WBC 4.3  HGB 7.5*  HCT 24.8*  PLT 249  MCV 94.3  MCH 28.5  MCHC 30.2  RDW 16.8*   ------------------------------------------------------------------------------------------------------------------  Chemistries   Recent Labs Lab 05/29/15 1215  NA 140  K 3.2*  CL 98*  CO2 31  GLUCOSE 137*  BUN 12  CREATININE 3.39*  CALCIUM 8.8*  AST 31  ALT 23  ALKPHOS 152*  BILITOT 0.6   ------------------------------------------------------------------------------------------------------------------ estimated creatinine clearance is 18.7 mL/min (by C-G formula based on Cr of 3.39). ------------------------------------------------------------------------------------------------------------------ No results for input(s): TSH, T4TOTAL, T3FREE, THYROIDAB in the last 72 hours.  Invalid input(s): FREET3  Coagulation profile No results for input(s): INR, PROTIME in the last 168 hours. ------------------------------------------------------------------------------------------------------------------- No results for input(s): DDIMER in the last 72 hours. -------------------------------------------------------------------------------------------------------------------  Cardiac Enzymes No results for input(s): CKMB, TROPONINI, MYOGLOBIN in the last 168 hours.  Invalid input(s): CK ------------------------------------------------------------------------------------------------------------------ No results found for: BNP   ---------------------------------------------------------------------------------------------------------------  Urinalysis No results found for: COLORURINE, APPEARANCEUR, LABSPEC, [place], GLUCOSEU, HGBUR, BILIRUBINUR, KETONESUR, PROTEINUR, UROBILINOGEN, NITRITE,  LEUKOCYTESUR  ----------------------------------------------------------------------------------------------------------------   Imaging Results:    No results found.    Assessment & Plan:    Active Problems:   End-stage renal disease (ESRD) (HCC)   Anemia   Occult blood in stools    1. Anemia (symptomatic) Check ferritin, iron, tibc, b12, folate, consider tsh, spep, upep as outpatient Type and screen for 2 units prbc.  Transfuse 2 units prbc.  Check cbc in am  2. ESRD on HD T, T, S Check cmp in am  3. Dm2 fsbs ac and qhs, iss  4. CAD Cont current medications  5.  Hypokalemia Check cmp in am.   6. Hemeoccult stool GI consultation as outpatient please   DVT Prophylaxis Heparin  AM Labs Ordered, also please review Full Orders  Family Communication: Admission, patients condition and plan of care including tests being ordered have been discussed with the patient who indicate understanding and agree with the plan and Code Status.  Code Status FULL CODE  Likely DC to  home  Condition GUARDED    Consults called: none  Admission status: observation   Time spent in minutes : 40  Jani Gravel M.D on 05/29/2015 at 3:09 PM  Between 7am to 7pm - Pager - [phone]. After 7pm go to [email or web address] - password [facility]  [facility] - Office  [phone]

## 2015-05-29 NOTE — ED Notes (Signed)
Pt tolerating blood admin well, no complaints. Pt alert and oriented x 4 and pain free.

## 2015-05-29 NOTE — ED Notes (Signed)
Pt sent from [facility] today for hgb 7.1 and to have PRBC.  Pt did have dialysis today.

## 2015-05-29 NOTE — ED Notes (Signed)
PT denies SOB, chest pain, and/or weakness.

## 2015-05-30 DIAGNOSIS — D649 Anemia, unspecified: Secondary | ICD-10-CM | POA: Diagnosis not present

## 2015-05-30 LAB — GLUCOSE, CAPILLARY: Glucose-Capillary: 95 mg/dL (ref 65–99)

## 2015-05-30 LAB — CBC
HCT: 27.6 % — ABNORMAL LOW (ref 39.0–52.0)
HEMOGLOBIN: 8.7 g/dL — AB (ref 13.0–17.0)
MCH: 30 pg (ref 26.0–34.0)
MCHC: 31.5 g/dL (ref 30.0–36.0)
MCV: 95.2 fL (ref 78.0–100.0)
PLATELETS: 223 10*3/uL (ref 150–400)
RBC: 2.9 MIL/uL — AB (ref 4.22–5.81)
RDW: 17.2 % — ABNORMAL HIGH (ref 11.5–15.5)
WBC: 4 10*3/uL (ref 4.0–10.5)

## 2015-05-30 LAB — TYPE AND SCREEN
ABO/RH(D): O POS
ANTIBODY SCREEN: NEGATIVE
Unit division: 0
Unit division: 0

## 2015-05-30 LAB — FERRITIN: FERRITIN: 56 ng/mL (ref 24–336)

## 2015-05-30 LAB — VITAMIN B12: Vitamin B-12: 277 pg/mL (ref 180–914)

## 2015-05-30 NOTE — Progress Notes (Signed)
Patient Discharge: Disposition: patient discharged to home. Education: Reviewed medications, prescriptions, follow-up appointments and discharge instructions, understood and acknowledged. IV: Discontinued IV before discharge. Telemetry: Discontinued before discharge, CCMD notified. Transportation: Patient escorted out of the unit in w/c. Belongings: Patient took all his belongings with him.

## 2015-05-30 NOTE — Discharge Instructions (Signed)
Anemia, Nonspecific Anemia is a condition in which the concentration of red blood cells or hemoglobin in the blood is below normal. Hemoglobin is a substance in red blood cells that carries oxygen to the tissues of the body. Anemia results in not enough oxygen reaching these tissues.  CAUSES  Common causes of anemia include:   Excessive bleeding. Bleeding may be internal or external. This includes excessive bleeding from periods (in women) or from the intestine.   Poor nutrition.   Chronic kidney, thyroid, and liver disease.  Bone marrow disorders that decrease red blood cell production.  Cancer and treatments for cancer.  HIV, AIDS, and their treatments.  Spleen problems that increase red blood cell destruction.  Blood disorders.  Excess destruction of red blood cells due to infection, medicines, and autoimmune disorders. SIGNS AND SYMPTOMS   Minor weakness.   Dizziness.   Headache.  Palpitations.   Shortness of breath, especially with exercise.   Paleness.  Cold sensitivity.  Indigestion.  Nausea.  Difficulty sleeping.  Difficulty concentrating. Symptoms may occur suddenly or they may develop slowly.  DIAGNOSIS  Additional blood tests are often needed. These help your health care provider determine the best treatment. Your health care provider will check your stool for blood and look for other causes of blood loss.  TREATMENT  Treatment varies depending on the cause of the anemia. Treatment can include:   Supplements of iron, vitamin B12, or folic acid.   Hormone medicines.   A blood transfusion. This may be needed if blood loss is severe.   Hospitalization. This may be needed if there is significant continual blood loss.   Dietary changes.  Spleen removal. HOME CARE INSTRUCTIONS Keep all follow-up appointments. It often takes many weeks to correct anemia, and having your health care provider check on your condition and your response to  treatment is very important. SEEK IMMEDIATE MEDICAL CARE IF:   You develop extreme weakness, shortness of breath, or chest pain.   You become dizzy or have trouble concentrating.  You develop heavy vaginal bleeding.   You develop a rash.   You have bloody or black, tarry stools.   You faint.   You vomit up blood.   You vomit repeatedly.   You have abdominal pain.  You have a fever or persistent symptoms for more than 2-3 days.   You have a fever and your symptoms suddenly get worse.   You are dehydrated.  MAKE SURE YOU:  Understand these instructions.  Will watch your condition.  Will get help right away if you are not doing well or get worse.   This information is not intended to replace advice given to you by your health care provider. Make sure you discuss any questions you have with your health care provider.   Document Released: 02/03/2004 Document Revised: 08/28/2012 Document Reviewed: 06/21/2012 Elsevier Interactive Patient Education 2016 Elsevier Inc.  

## 2015-05-30 NOTE — Discharge Summary (Signed)
[facility]  Physician Discharge Summary   Patient ID: [name] [name] MRN: [id number] DOB/AGE: July 15, 1930 82 y.o.  Admit date: 05/29/2015 Discharge date: 05/30/2015  PCP: [name], MD  DISCHARGE DIAGNOSES:  Active Problems:   End-stage renal disease (ESRD) ([place])   Symptomatic anemia   Anemia   Occult blood in stools   RECOMMENDATIONS FOR OUTPATIENT FOLLOW UP: 1. Patient to go for his dialysis as per usual schedule 2. Monitor hemoglobin as outpatient.   DISCHARGE CONDITION: fair  Diet recommendation: As before  Filed Weights   05/29/15 1204 05/29/15 2051  Weight: 83.371 kg (183 lb 12.8 oz) 85.3 kg (188 lb 0.8 oz)    INITIAL HISTORY: 82 year old African-American male with past medical history of coronary artery disease status post CABG, end-stage renal disease on dialysis, prostate cancer, anemia, was sent over to the emergency department due to low hemoglobin. He was hospitalized for blood transfusion.  Consultations:  None  Procedures:  Blood transfusion  HOSPITAL COURSE:   Anemia (symptomatic) Patient was transfused 2 units of blood. He feels better symptomatically. His hemoglobin has improved. No overt bleeding has been noted. His anemia is most likely secondary to chronic disease. It looks like he has undergone GI workup for his anemia recently with a colonoscopy a few weeks ago. Ferritin was 56. Vitamin B-12 level was 277. He has been asked to discuss further with his PCP regarding his anemia. May need referral to hematology. It looks like he is getting iron infusion with dialysis.  ESRD on HD T, T, S Stable. Patient is on high twos. It is Saturday dialysis regimen. He was dialyzed last on Saturday, which was yesterday. He will resume his usual schedule from Tuesday.   Dm2 Stable. May continue his home regimen  CAD Continue home medications. Stable.   Hypokalemia Patient is on dialysis. Can be monitored as an outpatient.  Positive  Hemeoccult stool Patient tells me that he underwent colonoscopy recently. According to him, it did not show any concerning findings. He will need to discuss this further with his PCP.   Overall improved. Patient wishes to go home. He tolerated his transfusion well. Okay for discharge today.  PERTINENT LABS:  The results of significant diagnostics from this hospitalization (including imaging, microbiology, ancillary and laboratory) are listed below for reference.      Labs: Basic Metabolic Panel:  Recent Labs Lab 05/29/15 1215  NA 140  K 3.2*  CL 98*  CO2 31  GLUCOSE 137*  BUN 12  CREATININE 3.39*  CALCIUM 8.8*   Liver Function Tests:  Recent Labs Lab 05/29/15 1215  AST 31  ALT 23  ALKPHOS 152*  BILITOT 0.6  PROT 6.8  ALBUMIN 3.1*   CBC:  Recent Labs Lab 05/29/15 1215 05/30/15 0548  WBC 4.3 4.0  HGB 7.5* 8.7*  HCT 24.8* 27.6*  MCV 94.3 95.2  PLT 249 223   CBG:  Recent Labs Lab 05/29/15 2052 05/30/15 0820  GLUCAP 117* 95     IMAGING STUDIES No results found.  DISCHARGE EXAMINATION: Filed Vitals:   05/29/15 1845 05/29/15 2051 05/30/15 0505 05/30/15 0821  BP: 115/42 124/51 111/43 135/50  Pulse: 69 69 63 64  Temp: 97.7 F (36.5 C) 98.6 F (37 C) 98.2 F (36.8 C) 98 F (36.7 C)  TempSrc: Oral Oral Oral Oral  Resp: 16 18 18 17   Height:      Weight:  85.3 kg (188 lb 0.8 oz)    SpO2: 100% 100% 100% 99%   General  appearance: alert, cooperative, appears stated age and no distress Resp: clear to auscultation bilaterally Cardio: regular rate and rhythm, S1, S2 normal, no murmur, click, rub or gallop GI: soft, non-tender; bowel sounds normal; no masses,  no organomegaly Extremities: extremities normal, atraumatic, no cyanosis or edema   DISPOSITION: Home with family  Discharge Instructions    Call MD for:  difficulty breathing, headache or visual disturbances    Complete by:  As directed      Call MD for:  extreme fatigue    Complete by:   As directed      Call MD for:  persistant dizziness or light-headedness    Complete by:  As directed      Call MD for:  persistant nausea and vomiting    Complete by:  As directed      Call MD for:  redness, tenderness, or signs of infection (pain, swelling, redness, odor or green/yellow discharge around incision site)    Complete by:  As directed      Call MD for:  severe uncontrolled pain    Complete by:  As directed      Call MD for:  temperature >100.4    Complete by:  As directed      Discharge instructions    Complete by:  As directed   Please go for dialysis as per usual schedule. You will need to discuss with your PCP or nephrologist regarding further evaluation/treatment of the anemia.  You were cared for by a hospitalist during your hospital stay. If you have any questions about your discharge medications or the care you received while you were in the hospital after you are discharged, you can call the unit and asked to speak with the hospitalist on call if the hospitalist that took care of you is not available. Once you are discharged, your primary care physician will handle any further medical issues. Please note that NO REFILLS for any discharge medications will be authorized once you are discharged, as it is imperative that you return to your primary care physician (or establish a relationship with a primary care physician if you do not have one) for your aftercare needs so that they can reassess your need for medications and monitor your lab values. If you do not have a primary care physician, you can call [phone] for a physician referral.     Increase activity slowly    Complete by:  As directed            ALLERGIES: No Known Allergies   Discharge Medication List as of 05/30/2015  8:53 AM    CONTINUE these medications which have NOT CHANGED   Details  amLODipine (NORVASC) 2.5 MG tablet TAKE 1 TABLET BY MOUTH DAILY, Normal    aspirin EC 81 MG EC tablet Take 1 tablet (81  mg total) by mouth daily., Starting 01/07/2013, Until Discontinued, OTC    calcium acetate (PHOSLO) 667 MG capsule Take 1-2 capsules (667-1,334 mg total) by mouth See admin instructions. Take 2 capsules (1334 mg) with meals and 1 capsule (667 mg) with snacks or a sandwich, Starting 08/16/2014, Until Discontinued, Print    cinacalcet (SENSIPAR) 30 MG tablet Take 30 mg by mouth at bedtime. , Until Discontinued, Historical Med    folic acid-vitamin b complex-vitamin c-selenium-zinc (DIALYVITE) 3 MG TABS tablet Take 1 tablet by mouth at bedtime., Until Discontinued, Historical Med    polyethylene glycol (MIRALAX / GLYCOLAX) packet Take 17 g by mouth daily as needed for  moderate constipation., Until Discontinued, Historical Med    Polyvinyl Alcohol-Povidone (REFRESH OP) Place 1 drop into both eyes daily as needed (dry eyes)., Until Discontinued, Historical Med    sodium chloride 0.9 % SOLN 100 mL with ferric gluconate 12.5 MG/ML SOLN 125 mg Inject 125 mg into the vein Every Tuesday,Thursday,and Saturday with dialysis., Starting 01/07/2013, Until Discontinued, No Print       Follow-up Information    Follow up with [name], MD. Schedule an appointment as soon as possible for a visit in 1 week.   Specialty:  Family Medicine   Why:  post hospitalization follow up and to discuss further management of anemia   Contact information:   [address] [place] [address] [phone]       TOTAL DISCHARGE TIME: 35 minutes  [facility]  [facility] Pager [phone]  05/30/2015, 1:40 PM

## 2015-05-31 LAB — HEMOGLOBIN A1C
Hgb A1c MFr Bld: 5.4 % (ref 4.8–5.6)
MEAN PLASMA GLUCOSE: 108 mg/dL

## 2015-06-01 LAB — FOLATE RBC
FOLATE, HEMOLYSATE: 430.3 ng/mL
FOLATE, RBC: 1576 ng/mL (ref >498)
HEMATOCRIT: 27.3 % — AB (ref 37.5–51.0)

## 2015-07-07 ENCOUNTER — Other Ambulatory Visit: Payer: Self-pay | Admitting: Gastroenterology

## 2015-07-26 ENCOUNTER — Other Ambulatory Visit: Payer: Self-pay | Admitting: Vascular Surgery

## 2015-07-29 ENCOUNTER — Encounter: Payer: Self-pay | Admitting: Vascular Surgery

## 2015-08-04 ENCOUNTER — Ambulatory Visit (INDEPENDENT_AMBULATORY_CARE_PROVIDER_SITE_OTHER): Payer: Medicare Other | Admitting: Vascular Surgery

## 2015-08-04 ENCOUNTER — Encounter: Payer: Self-pay | Admitting: Vascular Surgery

## 2015-08-04 VITALS — BP 138/56 | HR 69 | Temp 97.3°F | Ht 73.0 in | Wt 184.0 lb

## 2015-08-04 DIAGNOSIS — T82590A Other mechanical complication of surgically created arteriovenous fistula, initial encounter: Secondary | ICD-10-CM | POA: Diagnosis not present

## 2015-08-04 NOTE — Progress Notes (Signed)
Referring Physician: Elmarie Shiley MD Patient name: [name] [name] MRN: [id number] DOB: Jun 05, 1930 Sex: male  REASON FOR CONSULT: AV fistula aneurysm right arm  HPI: [name] [name] is a 82 y.o. male,  sent for evaluation of the fistula aneurysm. The patient states he has had the fistula for 6 years. He has had no prior bleeding episodes. He has never developed ulceration on the fistula. He has had interventions on the fistula in the past for narrowing or thrombosis. He is not on any anticoagulation. Other medical problems include anemia, hypertension, diabetes all of which are currently stable. The patient dialyzes on Tuesday Thursday Saturday.  Past Medical History:  Diagnosis Date  . Anemia of chronic disease   . Chest pain   . Dyslipidemia   . ESRD (end stage renal disease) on dialysis [facility])    "Industrial; T, Thurs, Sat" (12/26/2012)  . Gout   . Hypertension   . MYOCARDIAL INFARCTION, ACUTE, NON-Q WAVE 04/26/2009   Qualifier: Diagnosis of  By: Percival Spanish, MD, Farrel Gordon    . Obesity   . Prostate cancer ([place])   . Shortness of breath    "comes up at anytime" (12/26/2012)  . Type II diabetes mellitus ([place])    "diet controlled" (12/26/2012)  . Volume overload    Past Surgical History:  Procedure Laterality Date  . ARTERIOVENOUS GRAFT PLACEMENT Right 12/2008   "upper arm"  . AV FISTULA PLACEMENT    . CORONARY ARTERY BYPASS GRAFT N/A 12/31/2012   Procedure: CORONARY ARTERY BYPASS GRAFTING times four using left internal mammary and right saphenous vein;  Surgeon: Grace Isaac, MD;  Location: [place];  Service: Open Heart Surgery;  Laterality: N/A;  . INTRAOPERATIVE TRANSESOPHAGEAL ECHOCARDIOGRAM N/A 12/31/2012   Procedure: INTRAOPERATIVE TRANSESOPHAGEAL ECHOCARDIOGRAM;  Surgeon: Grace Isaac, MD;  Location: [place];  Service: Open Heart Surgery;  Laterality: N/A;  . LEFT HEART CATHETERIZATION WITH CORONARY ANGIOGRAM N/A 12/30/2012   Procedure: LEFT HEART CATHETERIZATION WITH CORONARY  ANGIOGRAM;  Surgeon: Burnell Blanks, MD;  Location: [facility] CATH LAB;  Service: Cardiovascular;  Laterality: N/A;    Family History  Problem Relation Age of Onset  . Stroke Mother   . Diabetes Sister     SOCIAL HISTORY: Social History   Social History  . Marital status: Married    Spouse name: N/A  . Number of children: 2  . Years of education: N/A   Occupational History  . retired     Administrator   Social History Main Topics  . Smoking status: Former Smoker    Packs/day: 1.00    Years: 40.00    Types: Cigarettes    Quit date: 06/16/1995  . Smokeless tobacco: Never Used  . Alcohol use No     Comment: 12/26/2012 "none in 30 yr; drank some when I was young"  . Drug use: No  . Sexual activity: Not Currently   Other Topics Concern  . 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 He has two sons    No Known Allergies  Current Outpatient Prescriptions  Medication Sig Dispense Refill  . amLODipine (NORVASC) 2.5 MG tablet TAKE 1 TABLET BY MOUTH DAILY 90 tablet 3  . amoxicillin (AMOXIL) 500 MG capsule     . cinacalcet (SENSIPAR) 30 MG tablet Take 30 mg by mouth at bedtime.     . clarithromycin (BIAXIN) 500 MG tablet     .  aspirin EC 81 MG EC tablet Take 1 tablet (81 mg total) by mouth daily. (Patient not taking: Reported on 08/04/2015)    . calcium acetate (PHOSLO) 667 MG capsule Take 1-2 capsules (667-1,334 mg total) by mouth See admin instructions. Take 2 capsules (1334 mg) with meals and 1 capsule (667 mg) with snacks or a sandwich (Patient not taking: Reported on 08/04/2015) [id number] capsule 1  . folic acid-vitamin b complex-vitamin c-selenium-zinc (DIALYVITE) 3 MG TABS tablet Take 1 tablet by mouth at bedtime.    . polyethylene glycol (MIRALAX / GLYCOLAX) packet Take 17 g by mouth daily as needed for moderate constipation.    . Polyvinyl Alcohol-Povidone (REFRESH OP) Place 1 drop into both eyes daily as needed  (dry eyes).    . sodium chloride 0.9 % SOLN 100 mL with ferric gluconate 12.5 MG/ML SOLN 125 mg Inject 125 mg into the vein Every Tuesday,Thursday,and Saturday with dialysis. (Patient not taking: Reported on 08/04/2015)     No current facility-administered medications for this visit.     ROS:   General:  No weight loss, Fever, chills  HEENT: No recent headaches, no nasal bleeding, no visual changes, no sore throat  Neurologic: No dizziness, blackouts, seizures. No recent symptoms of stroke or mini- stroke. No recent episodes of slurred speech, or temporary blindness.  Cardiac: No recent episodes of chest pain/pressure, no shortness of breath at rest.  No shortness of breath with exertion.  Denies history of atrial fibrillation or irregular heartbeat  Vascular: No history of rest pain in feet.  No history of claudication.  No history of non-healing ulcer, No history of DVT   Pulmonary: No home oxygen, no productive cough, no hemoptysis,  No asthma or wheezing  Musculoskeletal:  [ ]  Arthritis, [ ]  Low back pain,  [ ]  Joint pain  Hematologic:No history of hypercoagulable state.  No history of easy bleeding.  No history of anemia  Gastrointestinal: No hematochezia or melena,  No gastroesophageal reflux, no trouble swallowing  Urinary: [x ] chronic Kidney disease, [ ]  on HD - [ ]  MWF or [x ] TTHS, [ ]  Burning with urination, [ ]  Frequent urination, [ ]  Difficulty urinating;   Skin: No rashes  Psychological: No history of anxiety,  No history of depression   Physical Examination  Vitals:   08/04/15 0901  BP: (!) 138/56  Pulse: 69  Temp: 97.3 F (36.3 C)  TempSrc: Oral  SpO2: 99%  Weight: 184 lb (83.5 kg)  Height: 6\' 1"  (1.854 m)    Body mass index is 24.28 kg/m.  General:  Alert and oriented, no acute distress HEENT: Normal Neck: No bruit or JVD Pulmonary: Clear to auscultation bilaterally Cardiac: Regular Rate and Rhythm without murmur Abdomen: Soft, non-tender,  non-distended Skin: No rash, 2 aneurysms right upper arm AV fistula each 3-4 cm in diameter. The skin is not thinned out or shiny in appearance or ulcerated. 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  PLAN:  Rotate stick sites to a new area. Patient was advised on how to control bleeding if this does occur. If the patient's skin becomes more thinned out over time develops ulceration or a shiny appearance suggesting it is going to break down over time I will be happy to see him  back at any point. Otherwise he will follow-up on an as-needed basis.   Ruta Hinds, MD [facility] Office: [phone] Pager: [phone]

## 2016-03-06 ENCOUNTER — Other Ambulatory Visit: Payer: Self-pay | Admitting: Urology

## 2016-03-06 DIAGNOSIS — C61 Malignant neoplasm of prostate: Secondary | ICD-10-CM

## 2016-03-20 ENCOUNTER — Encounter (HOSPITAL_COMMUNITY): Payer: Medicare Other

## 2016-03-20 ENCOUNTER — Encounter (HOSPITAL_COMMUNITY)
Admission: RE | Admit: 2016-03-20 | Discharge: 2016-03-20 | Disposition: A | Discharge status: Home / Self Care | Payer: Medicare Other | Source: Ambulatory Visit | Attending: Urology | Admitting: Urology

## 2016-03-20 ENCOUNTER — Encounter (HOSPITAL_COMMUNITY): Payer: Self-pay

## 2016-03-28 ENCOUNTER — Telehealth: Payer: Self-pay | Admitting: Oncology

## 2016-03-28 NOTE — Telephone Encounter (Signed)
Tc to schedule the pt to see Dr. Alen Blew. The phone continued to ring, vm didn't pick up. Will try back tomorrow.

## 2016-04-07 ENCOUNTER — Encounter: Payer: Self-pay | Admitting: Oncology

## 2016-04-07 ENCOUNTER — Telehealth: Payer: Self-pay | Admitting: Oncology

## 2016-04-07 NOTE — Telephone Encounter (Signed)
Unable to reach the pt. Will mail a letter w/the appt date and time.

## 2016-04-10 ENCOUNTER — Telehealth: Payer: Self-pay | Admitting: Oncology

## 2016-04-10 NOTE — Telephone Encounter (Signed)
Pt's wife cld to reschedule appt. He can only have appts on Mon & Wed due to dialysis. Appt has been rescheduled to 4/25 at 11am. Agreed to the appt date and time.

## 2016-04-20 ENCOUNTER — Ambulatory Visit: Payer: Medicare Other | Admitting: Oncology

## 2016-05-03 ENCOUNTER — Telehealth: Payer: Self-pay | Admitting: Oncology

## 2016-05-03 ENCOUNTER — Ambulatory Visit (HOSPITAL_BASED_OUTPATIENT_CLINIC_OR_DEPARTMENT_OTHER): Payer: Medicare Other | Admitting: Oncology

## 2016-05-03 VITALS — BP 134/40 | HR 71 | Temp 97.9°F | Resp 18 | Ht 73.0 in | Wt 210.5 lb

## 2016-05-03 DIAGNOSIS — C61 Malignant neoplasm of prostate: Secondary | ICD-10-CM | POA: Diagnosis not present

## 2016-05-03 DIAGNOSIS — E291 Testicular hypofunction: Secondary | ICD-10-CM

## 2016-05-03 NOTE — Progress Notes (Signed)
Reason for Referral: Prostate cancer.   HPI: This is a pleasant 83 year old gentleman currently of [place] where he lived the majority of his life. He has history of long-standing hypertension and end-stage renal disease and currently hemodialysis-dependent for the last 6 years. He was diagnosed with prostate cancer in December 2016. At that time he presented with PSA of 369 and underwent a prostate biopsy by Dr. Diona Fanti the biopsy showed Gleason score 4+4 = 8 prostate cancer in multiple cores. No clear-cut metastasis was noted at that time. He received therapy with androgen deprivation started with Mills Koller on January 2017. His PSA nadir was 31 and 2017. In November 2017 his PSA was 29 and in March 2018 his PSA was 29.6 with testosterone level of 47. CT scan of the abdomen and pelvis obtained in March 2018 showed no evidence of soft tissue or adenopathy in the abdomen or pelvis. There is a new sclerotic lesion identified within the right iliac bone compared to scan done in December 2016. A bone scan obtained on showed no evidence of metastatic disease. He was referred to me for evaluation for possible second line hormonal therapy. Clinically, he reports feeling reasonably well at this time. He is asymptomatic from his prostate cancer. He denied any shoulder pain, back pain or pathological fractures. He still enjoys a reasonable quality of life despite his age and hemodialysis dependence. He is and uric and does not report any urinary symptoms.  He does not report any headaches, blurry vision, syncope or seizures. He hadn't reported any fevers, chills, sweats or weight loss. He does not report any chest pain, palpitation, orthopnea or leg edema. He does not report any cough, wheezing or hemoptysis. He does not report any nausea, vomiting or abdominal pain. He does not report any constipation, diarrhea or hematochezia. He does not report any ecchymosis or rash. He does not report any skeletal complaints.  Remaining review of system is unremarkable.   Past Medical History:  Diagnosis Date  . Anemia of chronic disease   . Chest pain   . Dyslipidemia   . ESRD (end stage renal disease) on dialysis [facility])    "Industrial; T, Thurs, Sat" (12/26/2012)  . Gout   . Hypertension   . MYOCARDIAL INFARCTION, ACUTE, NON-Q WAVE 04/26/2009   Qualifier: Diagnosis of  By: Percival Spanish, MD, Farrel Gordon    . Obesity   . Prostate cancer ([place])   . Shortness of breath    "comes up at anytime" (12/26/2012)  . Type II diabetes mellitus ([place])    "diet controlled" (12/26/2012)  . Volume overload   :  Past Surgical History:  Procedure Laterality Date  . ARTERIOVENOUS GRAFT PLACEMENT Right 12/2008   "upper arm"  . AV FISTULA PLACEMENT    . CORONARY ARTERY BYPASS GRAFT N/A 12/31/2012   Procedure: CORONARY ARTERY BYPASS GRAFTING times four using left internal mammary and right saphenous vein;  Surgeon: Grace Isaac, MD;  Location: [place];  Service: Open Heart Surgery;  Laterality: N/A;  . INTRAOPERATIVE TRANSESOPHAGEAL ECHOCARDIOGRAM N/A 12/31/2012   Procedure: INTRAOPERATIVE TRANSESOPHAGEAL ECHOCARDIOGRAM;  Surgeon: Grace Isaac, MD;  Location: [place];  Service: Open Heart Surgery;  Laterality: N/A;  . LEFT HEART CATHETERIZATION WITH CORONARY ANGIOGRAM N/A 12/30/2012   Procedure: LEFT HEART CATHETERIZATION WITH CORONARY ANGIOGRAM;  Surgeon: Burnell Blanks, MD;  Location[facility] C[facility] LAB;  Service: Cardiovascular;  Laterality: N/A;  :   Current Outpatient Prescriptions:  .  amLODipine (NORVASC) 2.5 MG tablet, TAKE 1 TABLET BY  MOUTH DAILY, Disp: 90 tablet, Rfl: 3 .  calcium acetate (PHOSLO) 667 MG capsule, Take 1-2 capsules (667-1,334 mg total) by mouth See admin instructions. Take 2 capsules (1334 mg) with meals and 1 capsule (667 mg) with snacks or a sandwich, Disp: 180 capsule, Rfl: 1 .  cinacalcet (SENSIPAR) 30 MG tablet, Take 30 mg by mouth at bedtime. , Disp: , Rfl:  .  Polyvinyl Alcohol-Povidone  (REFRESH OP), Place 1 drop into both eyes daily as needed (dry eyes)., Disp: , Rfl:  .  polyethylene glycol (MIRALAX / GLYCOLAX) packet, Take 17 g by mouth daily as needed for moderate constipation., Disp: , Rfl: :  No Known Allergies:  Family History  Problem Relation Age of Onset  . Stroke Mother   . Diabetes Sister   :  Social History   Social History  . Marital status: Married    Spouse name: N/A  . Number of children: 2  . Years of education: N/A   Occupational History  . retired     Administrator   Social History Main Topics  . Smoking status: Former Smoker    Packs/day: 1.00    Years: 40.00    Types: Cigarettes    Quit date: 06/16/1995  . Smokeless tobacco: Never Used  . Alcohol use No     Comment: 12/26/2012 "none in 30 yr; drank some when I was young"  . Drug use: No  . Sexual activity: Not Currently   Other Topics Concern  . 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 He has two sons  :  Pertinent items are noted in HPI.  Exam: Blood pressure (!) 134/40, pulse 71, temperature 97.9 F (36.6 C), temperature source Oral, resp. rate 18, height 6\' 1"  (1.854 m), weight 210 lb 8 oz (95.5 kg), SpO2 100 %.  ECOG 1 General appearance: alert and cooperative appeared without distress. Throat: No oral thrush or ulcers. Neck: no adenopathy Back: negative Resp: clear to auscultation bilaterally Chest wall: no tenderness Cardio: regular rate and rhythm, S1, S2 normal, no murmur, click, rub or gallop GI: soft, non-tender; bowel sounds normal; no masses,  no organomegaly Extremities: Without edema or induration.   CBC    Component Value Date/Time   WBC 4.0 05/30/2015 0548   RBC 2.90 (L) 05/30/2015 0548   HGB 8.7 (L) 05/30/2015 0548   HCT 27.6 (L) 05/30/2015 0548   HCT 27.3 (L) 05/29/2015 2258   PLT 223 05/30/2015 0548   MCV 95.2 05/30/2015 0548   MCH 30.0 05/30/2015 0548   MCHC 31.5 05/30/2015  0548   RDW 17.2 (H) 05/30/2015 0548   LYMPHSABS 0.7 01/26/2009 1325   MONOABS 0.8 01/26/2009 1325   EOSABS 0.0 01/26/2009 1325   BASOSABS 0.0 01/26/2009 1325     Chemistry      Component Value Date/Time   NA 140 05/29/2015 1215   K 3.2 (L) 05/29/2015 1215   CL 98 (L) 05/29/2015 1215   CO2 31 05/29/2015 1215   BUN 12 05/29/2015 1215   CREATININE 3.39 (H) 05/29/2015 1215      Component Value Date/Time   CALCIUM 8.8 (L) 05/29/2015 1215   CALCIUM 7.8 (L) 01/27/2009 1503   ALKPHOS 152 (H) 05/29/2015 1215   AST 31 05/29/2015 1215   ALT 23 05/29/2015 1215   BILITOT 0.6 05/29/2015 1215       Assessment and Plan:   83 year old gentleman  with the following issues:  1. Prostate cancer diagnosed in December 2016. His PSA was 369 and a prostate biopsy showed a Gleason score 4+4 = 8 and multiple cores. He did not have evidence of metastatic disease at presentation. He was started on androgen deprivation therapy and he remains on it under the care of Dr. Diona Fanti. His PSA decreased to 31 in 2017. His PSA in November 2017 was 41 and in March 2018 was 29. CT scan of the abdomen and pelvis showed possible sclerotic bone lesions indicating metastatic disease.  The natural course of advanced prostate cancer was discussed today with the patient and his wife. He understands that any treatment option moving forward and in the past is palliative at this time. He is currently responding reasonably well to androgen deprivation therapy with PSA potentially plateauing around 29. The rationale for using sig line hormonal therapy in the form of Xtandi, Zytiga and potentially systemic chemotherapy was discussed. I think there will be benefit of using any of these agents at this time given the fact that he may be developing castration resistant disease.  Risks and benefits of all these options were debated today and I feel he would be a better candidate for oral Xtandi if his PSA continues to rise. I would  favor a period of observation and monitoring his PSA given his last PSA did decrease slightly. If his PSA starts to rise again, repeat a bone scan and starting Gillermina Phy will be recommended. This PSA continues to decline I would favor leaving him on androgen deprivation therapy only to preserve his quality of life which is very important to him at this time.  The plan is to repeat his PSA in 2 months and consider adding second line hormonal therapy such as Gillermina Phy if his PSA continues to rise.  2. Androgen deprivation therapy: I recommended continuing that indefinitely under the care of Dr. Diona Fanti.  3. Bone directed therapy: He has poor dental hygiene and will need dental clearance before bone directed therapy if he develops clear-cut evidence of metastatic bony disease.  4. Follow-up: Will be in 2 months after repeating a PSA.

## 2016-05-03 NOTE — Telephone Encounter (Signed)
Appointments scheduled per 05/03/16 los. Patient was given a copy of the AVS report and appointment schedule per 05/03/16 los. °

## 2016-05-11 ENCOUNTER — Other Ambulatory Visit: Payer: Self-pay | Admitting: Cardiology

## 2016-07-03 ENCOUNTER — Other Ambulatory Visit (HOSPITAL_BASED_OUTPATIENT_CLINIC_OR_DEPARTMENT_OTHER): Payer: Medicare Other

## 2016-07-03 ENCOUNTER — Encounter: Payer: Self-pay | Admitting: *Deleted

## 2016-07-03 DIAGNOSIS — C61 Malignant neoplasm of prostate: Secondary | ICD-10-CM

## 2016-07-03 LAB — COMPREHENSIVE METABOLIC PANEL
ALBUMIN: 3.5 g/dL (ref 3.5–5.0)
ALK PHOS: 135 U/L (ref 40–150)
ALT: 13 U/L (ref 0–55)
AST: 19 U/L (ref 5–34)
Anion Gap: 16 mEq/L — ABNORMAL HIGH (ref 3–11)
BUN: 33.4 mg/dL — AB (ref 7.0–26.0)
CALCIUM: 9.9 mg/dL (ref 8.4–10.4)
CHLORIDE: 97 meq/L — AB (ref 98–109)
CO2: 31 mEq/L — ABNORMAL HIGH (ref 22–29)
Creatinine: 9.4 mg/dL (ref 0.7–1.3)
EGFR: 5 mL/min/{1.73_m2} — AB (ref >90)
GLUCOSE: 99 mg/dL (ref 70–140)
POTASSIUM: 4.8 meq/L (ref 3.5–5.1)
SODIUM: 144 meq/L (ref 136–145)
Total Bilirubin: 0.69 mg/dL (ref 0.20–1.20)
Total Protein: 7.4 g/dL (ref 6.4–8.3)

## 2016-07-03 LAB — CBC WITH DIFFERENTIAL/PLATELET
BASO%: 1.1 % (ref 0.0–2.0)
BASOS ABS: 0 10*3/uL (ref 0.0–0.1)
EOS ABS: 0.1 10*3/uL (ref 0.0–0.5)
EOS%: 3.1 % (ref 0.0–7.0)
HCT: 37.8 % — ABNORMAL LOW (ref 38.4–49.9)
HGB: 12.4 g/dL — ABNORMAL LOW (ref 13.0–17.1)
LYMPH%: 35 % (ref 14.0–49.0)
MCH: 30.3 pg (ref 27.2–33.4)
MCHC: 33 g/dL (ref 32.0–36.0)
MCV: 91.9 fL (ref 79.3–98.0)
MONO#: 0.5 10*3/uL (ref 0.1–0.9)
MONO%: 12.4 % (ref 0.0–14.0)
NEUT#: 2.1 10*3/uL (ref 1.5–6.5)
NEUT%: 48.4 % (ref 39.0–75.0)
Platelets: 213 10*3/uL (ref 140–400)
RBC: 4.11 10*6/uL — ABNORMAL LOW (ref 4.20–5.82)
RDW: 15.7 % — ABNORMAL HIGH (ref 11.0–14.6)
WBC: 4.3 10*3/uL (ref 4.0–10.3)
lymph#: 1.5 10*3/uL (ref 0.9–3.3)

## 2016-07-04 LAB — PSA: Prostate Specific Ag, Serum: 23.5 ng/mL — ABNORMAL HIGH (ref 0.0–4.0)

## 2016-07-05 ENCOUNTER — Telehealth: Payer: Self-pay | Admitting: Oncology

## 2016-07-05 ENCOUNTER — Ambulatory Visit (HOSPITAL_BASED_OUTPATIENT_CLINIC_OR_DEPARTMENT_OTHER): Payer: Medicare Other | Admitting: Oncology

## 2016-07-05 VITALS — BP 154/53 | HR 68 | Temp 97.8°F | Resp 18 | Ht 73.0 in | Wt 209.6 lb

## 2016-07-05 DIAGNOSIS — C61 Malignant neoplasm of prostate: Secondary | ICD-10-CM | POA: Diagnosis not present

## 2016-07-05 DIAGNOSIS — E291 Testicular hypofunction: Secondary | ICD-10-CM | POA: Diagnosis not present

## 2016-07-05 NOTE — Progress Notes (Signed)
Hematology and Oncology Follow Up Visit  [name] [name] [id number] 1930/07/22 83 y.o. 07/05/2016 8:28 AM [name] [name], MDElkins, [name] [name], *   Principle Diagnosis: 83 year old gentleman with prostate cancer diagnosed in December 2016. His PSA was 369 and a prostate biopsy showed a Gleason score 4+4 = 8 and multiple cores. He did not have evidence of metastatic disease at presentation.    Prior Therapy: He was started on androgen deprivation therapy and he remains on it under the care of Dr. Diona [name]. His PSA decreased to 31 in 2017. His PSA in November 2017 was 41 and in March 2018 was 29. CT scan of the abdomen and pelvis showed possible sclerotic bone lesions indicating metastatic disease.   Current therapy: Adjuvant deprivation only and under consideration to start cycle on hormone therapy.  Interim History: [name] [name] presents today for a follow-up visit. Since the last visit, he continues to be in excellent health and shape without any recent complaints. He remains active and attends activities of daily living. He denied any recent hospitalizations or illnesses. He denied any oncological fractures or bone pain. He denied any urinary symptoms or excessive fatigue.  He does not report any headaches, blurry vision, syncope or seizures. He hadn't reported any fevers, chills, sweats or weight loss. He does not report any chest pain, palpitation, orthopnea or leg edema. He does not report any cough, wheezing or hemoptysis. He does not report any nausea, vomiting or abdominal pain. He does not report any constipation, diarrhea or hematochezia. He does not report any ecchymosis or rash. He does not report any skeletal complaints. Remaining review of system is unremarkable.    Medications: I have reviewed the patient's current medications.  Current Outpatient Prescriptions  Medication Sig Dispense Refill  . amLODipine (NORVASC) 2.5 MG tablet Take 1 tablet (2.5 mg total) by mouth daily.  OVERDUE FOR FOLLOW UP. PLEASE CALL AND SCHEDULE [phone] 30 tablet 0  . calcium acetate (PHOSLO) 667 MG capsule Take 1-2 capsules (667-1,334 mg total) by mouth See admin instructions. Take 2 capsules (1334 mg) with meals and 1 capsule (667 mg) with snacks or a sandwich 180 capsule 1  . cinacalcet (SENSIPAR) 30 MG tablet Take 30 mg by mouth at bedtime.     . polyethylene glycol (MIRALAX / GLYCOLAX) packet Take 17 g by mouth daily as needed for moderate constipation.    . Polyvinyl Alcohol-Povidone (REFRESH OP) Place 1 drop into both eyes daily as needed (dry eyes).     No current facility-administered medications for this visit.      Allergies: No Known Allergies  Past Medical History, Surgical history, Social history, and Family History were reviewed and updated.  Physical Exam: Blood pressure (!) 154/53, pulse 68, temperature 97.8 F (36.6 C), temperature source Oral, resp. rate 18, height 6\' 1"  (1.854 m), weight 209 lb 9.6 oz (95.1 kg), SpO2 98 %. ECOG: 1 General appearance: alert and cooperative appeared without distress. Head: Normocephalic, without obvious abnormality Neck: no adenopathy Lymph nodes: Cervical, supraclavicular, and axillary nodes normal. Heart:regular rate and rhythm, S1, S2 normal, no murmur, click, rub or gallop Lung:chest clear, no wheezing, rales, normal symmetric air entry Abdomin: soft, non-tender, without masses or organomegaly EXT:no erythema, induration, or nodules   Lab Results: Lab Results  Component Value Date   WBC 4.3 07/03/2016   HGB 12.4 (L) 07/03/2016   HCT 37.8 (L) 07/03/2016   MCV 91.9 07/03/2016   PLT 213 07/03/2016     Chemistry  Component Value Date/Time   NA 144 07/03/2016 0842   K 4.8 07/03/2016 0842   CL 98 (L) 05/29/2015 1215   CO2 31 (H) 07/03/2016 0842   BUN 33.4 (H) 07/03/2016 0842   CREATININE 9.4 (HH) 07/03/2016 0842      Component Value Date/Time   CALCIUM 9.9 07/03/2016 0842   ALKPHOS 135 07/03/2016 0842    AST 19 07/03/2016 0842   ALT 13 07/03/2016 0842   BILITOT 0.69 07/03/2016 0842     Results for [name] [name] (MRN [id number]) as of 07/05/2016 07:55  Ref. Range 07/03/2016 08:42  PSA Latest Ref Range: 0.0 - 4.0 ng/mL 23.5 (H)     Impression and Plan:   83 year old gentleman with the following issues:  1. Prostate cancer diagnosed in December 2016. His PSA was 369 and a prostate biopsy showed a Gleason score 4+4 = 8 and multiple cores. He did not have evidence of metastatic disease at presentation. He was started on androgen deprivation therapy and he remains on it under the care of Dr. Diona [name]. His PSA decreased to 31 in 2017. His PSA in November 2017 was 41 and in March 2018 was 29. CT scan of the abdomen and pelvis showed possible sclerotic bone lesions indicating metastatic disease.  PSA in June 2018 was 23 which is less than what it was in March 2018. Risks and benefits of starting Xtandi at this time were reviewed and we elected to defer this treatment time being. Given the fact that he is completely asymptomatic and his PSA is declining elected to hold off on any additional treatment. He understands he will require that in the future I will continue to monitor him closely.  2. Androgen deprivation therapy: I recommended continuing that indefinitely under the care of Dr. Diona [name].  3. Bone directed therapy: He has poor dental hygiene and will need dental clearance before bone directed therapy if he develops clear-cut evidence of metastatic bony disease.  4. Follow-up: Will be in 3 months after repeating a PSA.   [facility], MD 6/27/20188:28 AM

## 2016-07-05 NOTE — Telephone Encounter (Signed)
Appointments scheduled per 07/05/16 los. Patient was given a copy of the AVS report and appointment schedule, per 07/05/16 los.

## 2016-07-27 ENCOUNTER — Encounter: Payer: Self-pay | Admitting: *Deleted

## 2016-07-31 ENCOUNTER — Telehealth (HOSPITAL_COMMUNITY): Payer: Self-pay | Admitting: Dentistry

## 2016-07-31 NOTE — Telephone Encounter (Signed)
07/31/16  Called  home # and unable to leave msg. regarding pt. scheduling Dental Consult appt. w/Dr. Enrique Sack.  LRI

## 2016-08-09 ENCOUNTER — Encounter (HOSPITAL_COMMUNITY): Payer: Self-pay | Admitting: Dentistry

## 2016-08-09 ENCOUNTER — Ambulatory Visit (HOSPITAL_COMMUNITY): Payer: Self-pay | Admitting: Dentistry

## 2016-08-09 ENCOUNTER — Encounter (INDEPENDENT_AMBULATORY_CARE_PROVIDER_SITE_OTHER): Payer: Self-pay

## 2016-08-09 VITALS — BP 143/43 | HR 64 | Temp 98.3°F

## 2016-08-09 DIAGNOSIS — M264 Malocclusion, unspecified: Secondary | ICD-10-CM

## 2016-08-09 DIAGNOSIS — C61 Malignant neoplasm of prostate: Secondary | ICD-10-CM

## 2016-08-09 DIAGNOSIS — K036 Deposits [accretions] on teeth: Secondary | ICD-10-CM

## 2016-08-09 DIAGNOSIS — C7951 Secondary malignant neoplasm of bone: Secondary | ICD-10-CM

## 2016-08-09 DIAGNOSIS — K06020 Generalized gingival recession, unspecified: Secondary | ICD-10-CM

## 2016-08-09 DIAGNOSIS — Z01818 Encounter for other preprocedural examination: Secondary | ICD-10-CM

## 2016-08-09 DIAGNOSIS — K08409 Partial loss of teeth, unspecified cause, unspecified class: Secondary | ICD-10-CM

## 2016-08-09 DIAGNOSIS — K0530 Chronic periodontitis, unspecified: Secondary | ICD-10-CM

## 2016-08-09 DIAGNOSIS — K0889 Other specified disorders of teeth and supporting structures: Secondary | ICD-10-CM

## 2016-08-09 DIAGNOSIS — K029 Dental caries, unspecified: Secondary | ICD-10-CM

## 2016-08-09 DIAGNOSIS — M278 Other specified diseases of jaws: Secondary | ICD-10-CM

## 2016-08-09 NOTE — Progress Notes (Signed)
DENTAL CONSULTATION  Date of Consultation:  08/09/2016 Patient Name:   [name] [name] Date of Birth:   1930/10/17 Medical Record Number: [id number]  VITALS: BP (!) 143/43 (BP Location: Left Arm)   Pulse 64   Temp 98.3 F (36.8 C) (Oral)   CHIEF COMPLAINT: Patient referred by Dr. Diona Fanti for a dental consultation.  HPI: Satish Hammers is an 83 year old male with history of prostate cancer with metastatic bone disease. Patient with anticipated use of Xgeva therapy. Patient is now seen as part of a medically necessary pre-Xgeva therapy dental protocol examination.  The patient currently denies acute toothaches, swellings, or abscesses. Patient was last seen by a dentist a couple of years ago to have 3 teeth pulled and a maxillary partial denture inserted. This was at [facility].  Patient indicates that the maxillary partial denture "fits fine". Patient denies having a lower partial denture. Patient does not seek regular dental care for periodontal therapy. Patient denies having dental phobia.   PROBLEM LIST: Patient Active Problem List   Diagnosis Date Noted  . Anemia 05/29/2015  . Occult blood in stools 05/29/2015  . Anemia of chronic kidney failure 08/15/2014  . Shortness of breath   . Symptomatic anemia 06/11/2014  . 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 End-stage renal disease (ESRD) ([place]) 12/30/2012  . Secondary hyperparathyroidism ([place]) 12/30/2012  . Hypotension 06/16/2011  . Palpitations 06/16/2011  . CAD (coronary artery disease) of artery bypass graft 04/26/2009  . DYSPNEA ON EXERTION 03/04/2009  . DYSLIPIDEMIA 03/03/2009  . GOUT 03/03/2009  . Obesity 03/03/2009  . HYPERTENSION 03/03/2009    PMH: Past Medical History:  Diagnosis Date  . Anemia of chronic disease   . Chest pain   . Dyslipidemia   . ESRD (end stage renal disease) on dialysis [facility])    "[employer]; T, Thurs, Sat" (12/26/2012)  . Gout   . Hypertension   . MYOCARDIAL INFARCTION, ACUTE, NON-Q WAVE 04/26/2009    Qualifier: Diagnosis of  By: Percival Spanish, MD, Farrel Gordon    . Obesity   . Prostate cancer ([place])   . Shortness of breath    "comes up at anytime" (12/26/2012)  . Type II diabetes mellitus ([place])    "diet controlled" (12/26/2012)  . Volume overload     PSH: Past Surgical History:  Procedure Laterality Date  . ARTERIOVENOUS GRAFT PLACEMENT Right 12/2008   "upper arm"  . AV FISTULA PLACEMENT    . CORONARY ARTERY BYPASS GRAFT N/A 12/31/2012   Procedure: CORONARY ARTERY BYPASS GRAFTING times four using left internal mammary and right saphenous vein;  Surgeon: Grace Isaac, MD;  Location: [place];  Service: Open Heart Surgery;  Laterality: N/A;  . INTRAOPERATIVE TRANSESOPHAGEAL ECHOCARDIOGRAM N/A 12/31/2012   Procedure: INTRAOPERATIVE TRANSESOPHAGEAL ECHOCARDIOGRAM;  Surgeon: Grace Isaac, MD;  Location: [place];  Service: Open Heart Surgery;  Laterality: N/A;  . LEFT HEART CATHETERIZATION WITH CORONARY ANGIOGRAM N/A 12/30/2012   Procedure: LEFT HEART CATHETERIZATION WITH CORONARY ANGIOGRAM;  Surgeon: Burnell Blanks, MD;  Location: [facility] CATH LAB;  Service: Cardiovascular;  Laterality: N/A;    ALLERGIES: No Known Allergies  MEDICATIONS: Current Outpatient Prescriptions  Medication Sig Dispense Refill  . amLODipine (NORVASC) 2.5 MG tablet Take 1 tablet (2.5 mg total) by mouth daily. OVERDUE FOR FOLLOW UP. PLEASE CALL AND SCHEDULE [phone] 30 tablet 0  . calcium acetate (PHOSLO) 667 MG capsule Take 1-2 capsules (667-1,334 mg total) by mouth See admin instructions. Take 2 capsules (1334 mg) with meals and 1 capsule (  667 mg) with snacks or a sandwich 180 capsule 1  . cinacalcet (SENSIPAR) 30 MG tablet Take 30 mg by mouth at bedtime.     Marland Kitchen latanoprost (XALATAN) 0.005 % ophthalmic solution Place 1 drop into both eyes at bedtime.    Marland Kitchen leuprolide (LUPRON) 30 MG injection Inject 30 mg into the muscle every 4 (four) months.    . Polyvinyl Alcohol-Povidone (REFRESH OP) Place 1 drop  into both eyes daily as needed (dry eyes).    . polyethylene glycol (MIRALAX / GLYCOLAX) packet Take 17 g by mouth daily as needed for moderate constipation.     No current facility-administered medications for this visit.     LABS: Lab Results  Component Value Date   WBC 4.3 07/03/2016   HGB 12.4 (L) 07/03/2016   HCT 37.8 (L) 07/03/2016   MCV 91.9 07/03/2016   PLT 213 07/03/2016      Component Value Date/Time   NA 144 07/03/2016 0842   K 4.8 07/03/2016 0842   CL 98 (L) 05/29/2015 1215   CO2 31 (H) 07/03/2016 0842   GLUCOSE 99 07/03/2016 0842   BUN 33.4 (H) 07/03/2016 0842   CREATININE 9.4 (HH) 07/03/2016 0842   CALCIUM 9.9 07/03/2016 0842   GFRNONAA 15 (L) 05/29/2015 1215   GFRAA 18 (L) 05/29/2015 1215   Lab Results  Component Value Date   INR 1.45 12/31/2012   INR 1.12 12/30/2012   INR 1.10 12/26/2012   No results found for: PTT  SOCIAL HISTORY: Social History   Social History  . Marital status: Married    Spouse name: N/A  . Number of children: 2  . Years of education: N/A   Occupational History  . retired     Administrator   Social History Main Topics  . Smoking status: Former Smoker    Packs/day: 1.00    Years: 40.00    Types: Cigarettes    Quit date: 06/16/1995  . Smokeless tobacco: Never Used  . Alcohol use No     Comment: 12/26/2012 "none in 30 yr; drank some when I was young"  . Drug use: No  . Sexual activity: Not Currently   [name] Topics Concern  . Not on file   Social History Narrative   He is a retired Administrator. He lives in [place] with his wife. He has two sons    FAMILY HISTORY: Family History  Problem Relation Age of Onset  . Stroke Mother   . Diabetes Sister     REVIEW OF SYSTEMS: Reviewed with the patient as per History of present illness. Psych: Patient denies having dental phobia.  DENTAL HISTORY: CHIEF COMPLAINT: Patient referred by Dr. Diona Fanti for a dental consultation.  HPI: [name] [name] is an 83 year old  male with history of prostate cancer with metastatic bone disease. Patient with anticipated use of Xgeva therapy. Patient is now seen as part of a medically necessary pre-Xgeva therapy dental protocol examination.  The patient currently denies acute toothaches, swellings, or abscesses. Patient was last seen by a dentist a couple of years ago to have 3 teeth pulled and a maxillary partial denture inserted. This was at [facility].  Patient indicates that the maxillary partial denture "fits fine". Patient denies having a lower partial denture. Patient does not seek regular dental care for periodontal therapy. Patient denies having dental phobia.   DENTAL EXAMINATION: GENERAL:Patient is a tall, well-developed, well-nourished male in no acute distress.  HEAD AND NECK: There is no palpable neck lymphadenopathy.  The patient denies acute TMJ symptoms.  INTRAORAL EXAM:Patient has normal saliva. There is no evidence of oral abscess formation. The patient has buccal exostoses in the area of tooth numbers 1 through 3 and 14 through 16.  DENTITION:Patient is missing tooth numbers 2, 3, 5-8, 11-13, 15, 17-20, and 29-31. PERIODONTAL:Patient has chronic periodontitis with plaque and calculus accumulations, generalized gingival recession, and maxillary and mandibular anterior tooth mobility as charted.  DENTAL CARIES/SUBOPTIMAL RESTORATIONS:Multiple dental caries are noted as per dental charting form. Extensive dental caries on tooth numbers 9 and 10 may be impinging on the pulp.  ENDODONTIC:Patient denies acute pulpitis symptoms. The patient does not appear to have any periapical pathology or radiolucency. Caries on tooth numbers 9-10 appeared to be close to impinging on the pulp.  CROWN AND BRIDGE:There are no crown or bridge restorations.  PROSTHODONTIC: The patient has a maxillary partial denture but did not bring the partial denture in with him today. Patient denies having a lower partial denture. Patient  usually wears the maxillary partial denture only on the weekends, by report. Patient indicates that the maxillary partial denture "fits fine".  OCCLUSION:Patient has a poor occlusal scheme secondary to multiple missing teeth, supra-eruption and drifting of the unopposed teeth into the edentulous areas, and lack of replacement of all missing teeth with dental prostheses.  RADIOGRAPHIC INTERPRETATION: Orthopantogram was taken and supplemented with 13 periapical radiographs. There are multiple missing teeth. There is supra-eruption and drifting of the unopposed teeth into the edentulous areas. Multiple dental caries are noted. There is moderate bone loss noted. Radiographic calculus is noted. There is supra-eruption and drifting of the unopposed teeth into the edentulous areas.  ASSESSMENTS: 1. Prostate cancer with bone metastases 2. Pre-Xgeva therapy dental protocol examination 3. Dental caries 4. Chronic periodontitis with bone loss 5. Gingival recession 6. Accretions 7. Tooth mobility  8. Buccal exostoses in the area tooth numbers 1 through 3 and 14 through 16. 9. Multiple missing teeth 10. Supra-eruption and drifting of the unopposed teeth into the edentulous areas 11. Poor occlusal scheme and malocclusion   PLAN/RECOMMENDATIONS: 1. I discussed the risks, benefits, and complications of various treatment options with the patient in relationship to his medical and dental conditions, anticipated use of Xgeva therapy, and risk for osteonecrosis of the jaw with invasive dental procedures due to [facility] therapy. We discussed various treatment options to include no treatment, multiple extractions with alveoloplasty, pre-prosthetic surgery as indicated, periodontal therapy, dental restorations, root canal therapy, crown and bridge therapy, implant therapy, and replacement of missing teeth as indicated. The patient currently wishes to proceed with initial periodontal therapy with Dental Medicine.  Patient  will bring the maxillary partial denture in at that time for evaluation of the dental prosthesis.  Patient will then decide if he wishes to proceed with extraction of remaining maxillary teeth as well as tooth #32 with alveoloplasty and pre-prosthetic surgery as needed in the operating room with general anesthesia. Patient may alternatively wish to proceed with extraction of tooth numbers 9 and 10 only by [facility] with subsequent fabrication of a new maxillary partial denture that will allow for the additional replacement of tooth numbers 9 and 10. Patient will then need to find a primary dentist to allow for maintenance periodontal therapy in the future. The patient will be cleared for Xgeva therapy after adequate healing from the anticipated dental extractions. In the interim, Xgeva therapy is not to be administered.   2. Discussion of findings with medical team and coordination of  future medical and dental care as needed.  I spent in excess of  120 minutes during the conduct of this consultation and >50% of this time involved direct face-to-face encounter for counseling and/or coordination of the patient's care.    Lenn Cal, DDS

## 2016-08-09 NOTE — Patient Instructions (Addendum)
Patient is return to Dental Medicine on Monday, 08/14/2016 and 8 a.m. for initial periodontal therapy. Patient is to bring his partial denture with him at that time for evaluation of the prosthesis to consider ability to convert this partial denture to a new partial denture replacing tooth numbers 9 and 10. A decision will then be made as to the plan of care for the patient. In the interim, Xgeva therapy is not to be administered.  Dr. Enrique Sack

## 2016-08-14 ENCOUNTER — Ambulatory Visit (HOSPITAL_COMMUNITY): Payer: Self-pay | Admitting: Dentistry

## 2016-08-14 ENCOUNTER — Encounter (HOSPITAL_COMMUNITY): Payer: Self-pay | Admitting: Dentistry

## 2016-08-14 ENCOUNTER — Encounter (INDEPENDENT_AMBULATORY_CARE_PROVIDER_SITE_OTHER): Payer: Self-pay

## 2016-08-14 VITALS — BP 160/44 | HR 70 | Temp 98.0°F

## 2016-08-14 DIAGNOSIS — C61 Malignant neoplasm of prostate: Secondary | ICD-10-CM

## 2016-08-14 DIAGNOSIS — K0530 Chronic periodontitis, unspecified: Secondary | ICD-10-CM

## 2016-08-14 DIAGNOSIS — Z01818 Encounter for other preprocedural examination: Secondary | ICD-10-CM

## 2016-08-14 DIAGNOSIS — K036 Deposits [accretions] on teeth: Secondary | ICD-10-CM

## 2016-08-14 DIAGNOSIS — C7951 Secondary malignant neoplasm of bone: Secondary | ICD-10-CM

## 2016-08-14 DIAGNOSIS — K029 Dental caries, unspecified: Secondary | ICD-10-CM

## 2016-08-14 NOTE — Progress Notes (Signed)
08/14/2016  Patient:            [name] [name] Date of Birth:  08-02-1930 MRN:                [id number]   BP (!) 160/44 (BP Location: Left Arm)   Pulse 70   Temp 98 F (36.7 C) (Oral)    Khalifa Knecht is an 83 year old male with prostate cancer with metastasis to bone. Patient was seen as part of a pre-Xgeva therapy dental protocol examination.  Patient now presents for initial periodontal therapy and evaluation of maxillary partial denture.  We will then discuss further treatment options to include multiple extractions with alveoloplasty and evaluation for replacement of missing teeth as indicated.  Procedure: K9326 Scaling in the presence of gingival inflammation-full mouth. Cavitron scaling system used to remove significant accretions. A series of hand curettes were then used to further refine removal of accretions.  [place] Oral hygiene instruction was provided. Maxillary partial denture was then evaluated. This has less than ideal retention and stability but may be able to be relined and repaired if additional teeth are extracted. Patient tolerated the procedure well.  Plan/Recommendations: 1. I discussed the risks, benefits, and complications for additional treatment options for the patient in relationship to his medical and dental conditions and anticipated use of Xgeva therapy with risk for osteonecrosis of the jaw related to the [facility] therapy. I discussed extraction of tooth numbers 1, 4, 9, 10, 14, 16, and 32 with alveoloplasty and pre-prosthetic surgery as needed in the operating room with general anesthesia with [facility]. I also discussed extraction of tooth numbers 9 and 10 only with alveoloplasty with an oral surgeon and subsequent evaluation by [facility] or [facility] for a partial denture.  The patient currently is interested in follow-up with [facility] for evaluation for extraction of tooth numbers 9 and 10 and repair or replacement of the maxillary acrylic  partial denture. The patient indicates he will follow-up with [facility] early next week and will contact Dental Medicine when the extraction procedures have been completed.  Patient will then be scheduled for postop evaluation and I will evaluate patient for formal clearance for Xgeva therapy at that time. In the meantime, no Xgeva therapy should be given.  2. Patient to call if acute problems arise before then.  Patient was dismissed in stable condition.  Lenn Cal, DDS

## 2016-08-14 NOTE — Patient Instructions (Signed)
Patient is to contact [facility] for an appointment to evaluate extraction of tooth numbers 9 and 10 and repair or replacement of maxillary acrylic partial denture.  The patient is to then contact dental medicine for evaluation of healing after dental extraction with subsequent evaluation for clearance to start Xgeva therapy.  No Xgeva therapy is to be given at this time. Dr. Enrique Sack

## 2016-09-04 ENCOUNTER — Ambulatory Visit (HOSPITAL_COMMUNITY): Payer: Self-pay | Admitting: Dentistry

## 2016-09-04 ENCOUNTER — Encounter (INDEPENDENT_AMBULATORY_CARE_PROVIDER_SITE_OTHER): Payer: Self-pay

## 2016-09-04 ENCOUNTER — Encounter (HOSPITAL_COMMUNITY): Payer: Self-pay | Admitting: Dentistry

## 2016-09-04 VITALS — BP 162/40 | HR 66 | Temp 97.8°F

## 2016-09-04 DIAGNOSIS — K08199 Complete loss of teeth due to other specified cause, unspecified class: Secondary | ICD-10-CM

## 2016-09-04 DIAGNOSIS — C61 Malignant neoplasm of prostate: Secondary | ICD-10-CM

## 2016-09-04 DIAGNOSIS — K029 Dental caries, unspecified: Secondary | ICD-10-CM

## 2016-09-04 DIAGNOSIS — Z01818 Encounter for other preprocedural examination: Secondary | ICD-10-CM

## 2016-09-04 NOTE — Progress Notes (Signed)
PROGRESS NOTE:  09/04/2016 [name] [name] [id number]  VITALS: BP (!) 162/40 (BP Location: Left Arm)   Pulse 66   Temp 97.8 F (36.6 C) (Oral)   LABS:  Lab Results  Component Value Date   WBC 4.3 07/03/2016   HGB 12.4 (L) 07/03/2016   HCT 37.8 (L) 07/03/2016   MCV 91.9 07/03/2016   PLT 213 07/03/2016   BMET    Component Value Date/Time   NA 144 07/03/2016 0842   K 4.8 07/03/2016 0842   CL 98 (L) 05/29/2015 1215   CO2 31 (H) 07/03/2016 0842   GLUCOSE 99 07/03/2016 0842   BUN 33.4 (H) 07/03/2016 0842   CREATININE 9.4 (HH) 07/03/2016 0842   CALCIUM 9.9 07/03/2016 0842   GFRNONAA 15 (L) 05/29/2015 1215   GFRAA 18 (L) 05/29/2015 1215    Lab Results  Component Value Date   INR 1.45 12/31/2012   INR 1.12 12/30/2012   INR 1.10 12/26/2012   No results found for: PTT   [name] [name] is status post Extraction of tooth numbers 9 and 10 on 08/21/16 with insertion of maxillary partial denture repair on 08/23/2016 by [facility].  SUBJECTIVE: Patient without complaints of dental extraction sites or the maxillary partial denture repair.  EXAM: There is no sign of infection, heme, or loose from the dental extraction sites numbers 9 and 10. No sutures are present. Maxillary acrylic partial denture now replacing additional tooth numbers 9 and 10 is present. There is less than ideal retention and stability but is acceptable to the patient. There is no evidence of denture irritation. Incipient dental caries on tooth #4 is still present.  ASSESSMENT: Post operative course is consistent with dental procedures performed on 08/28/2016. Loss of teeth due to extraction Insertion of maxillary acrylic partial denture repair by [facility]. Incipient dental caries #4  PLAN: 1. Use salt water rinses as needed to aid healing from dental extraction sites. 2. Suggest follow-up with a new primary dentist for exam, restoration of caries on tooth #4, periodontal therapy as indicated.  This dentist may also evaluate for fabrication of a new partial denture if patient so desires. Patient to sign a release of information to send records to his new dentist once ne dentist is obtained. 3. Suggest an additional 3 weeks of healing before started Xgeva therapy by Dr. Diona Fanti.   Lenn Cal, DDS

## 2016-09-04 NOTE — Patient Instructions (Signed)
Patient was instructed to follow-up with a new primary dentist for exam, restoration of caries on tooth #4, and evaluation for new partial denture along with continued periodontal therapy. Patient will contact dental medicine if he wishes to have his records sent to the new dentist after release of information is signed. Patient is cleared for [facility] therapy after an additional 3 weeks of healing. Dr. Enrique Sack

## 2016-09-29 ENCOUNTER — Other Ambulatory Visit (HOSPITAL_BASED_OUTPATIENT_CLINIC_OR_DEPARTMENT_OTHER): Payer: Medicare Other

## 2016-09-29 ENCOUNTER — Encounter: Payer: Self-pay | Admitting: *Deleted

## 2016-09-29 DIAGNOSIS — C61 Malignant neoplasm of prostate: Secondary | ICD-10-CM | POA: Diagnosis not present

## 2016-09-29 LAB — CBC WITH DIFFERENTIAL/PLATELET
BASO%: 1 % (ref 0.0–2.0)
BASOS ABS: 0 10*3/uL (ref 0.0–0.1)
EOS ABS: 0.1 10*3/uL (ref 0.0–0.5)
EOS%: 2.4 % (ref 0.0–7.0)
HCT: 37.4 % — ABNORMAL LOW (ref 38.4–49.9)
HEMOGLOBIN: 12.4 g/dL — AB (ref 13.0–17.1)
LYMPH%: 27.1 % (ref 14.0–49.0)
MCH: 30.9 pg (ref 27.2–33.4)
MCHC: 33.2 g/dL (ref 32.0–36.0)
MCV: 93 fL (ref 79.3–98.0)
MONO#: 0.5 10*3/uL (ref 0.1–0.9)
MONO%: 12.2 % (ref 0.0–14.0)
NEUT#: 2.6 10*3/uL (ref 1.5–6.5)
NEUT%: 57.3 % (ref 39.0–75.0)
Platelets: 234 10*3/uL (ref 140–400)
RBC: 4.02 10*6/uL — AB (ref 4.20–5.82)
RDW: 14.7 % — ABNORMAL HIGH (ref 11.0–14.6)
WBC: 4.5 10*3/uL (ref 4.0–10.3)
lymph#: 1.2 10*3/uL (ref 0.9–3.3)

## 2016-09-29 LAB — COMPREHENSIVE METABOLIC PANEL
ALBUMIN: 3.5 g/dL (ref 3.5–5.0)
ALK PHOS: 141 U/L (ref 40–150)
ALT: 10 U/L (ref 0–55)
ANION GAP: 13 meq/L — AB (ref 3–11)
AST: 21 U/L (ref 5–34)
BUN: 27.9 mg/dL — AB (ref 7.0–26.0)
CO2: 31 mEq/L — ABNORMAL HIGH (ref 22–29)
Calcium: 9.9 mg/dL (ref 8.4–10.4)
Chloride: 98 mEq/L (ref 98–109)
Creatinine: 7.4 mg/dL (ref 0.7–1.3)
EGFR: 7 mL/min/{1.73_m2} — AB (ref >90)
Glucose: 91 mg/dl (ref 70–140)
POTASSIUM: 4.3 meq/L (ref 3.5–5.1)
SODIUM: 143 meq/L (ref 136–145)
Total Bilirubin: 0.48 mg/dL (ref 0.20–1.20)
Total Protein: 7.5 g/dL (ref 6.4–8.3)

## 2016-09-30 LAB — PSA: Prostate Specific Ag, Serum: 21.8 ng/mL — ABNORMAL HIGH (ref 0.0–4.0)

## 2016-09-30 LAB — TESTOSTERONE: TESTOSTERONE: 58 ng/dL — AB (ref 264–916)

## 2016-10-04 ENCOUNTER — Telehealth: Payer: Self-pay | Admitting: Oncology

## 2016-10-04 ENCOUNTER — Ambulatory Visit (HOSPITAL_BASED_OUTPATIENT_CLINIC_OR_DEPARTMENT_OTHER): Payer: Medicare Other | Admitting: Oncology

## 2016-10-04 VITALS — BP 161/47 | HR 76 | Temp 97.5°F | Resp 18 | Ht 73.0 in | Wt 209.7 lb

## 2016-10-04 DIAGNOSIS — C61 Malignant neoplasm of prostate: Secondary | ICD-10-CM

## 2016-10-04 DIAGNOSIS — E291 Testicular hypofunction: Secondary | ICD-10-CM | POA: Diagnosis not present

## 2016-10-04 NOTE — Progress Notes (Signed)
Hematology and Oncology Follow Up Visit  [name] [name] [id number] 1930-05-03 83 y.o. 10/04/2016 10:39 AM [name], MDElkins, [name], *   Principle Diagnosis: 83 year old gentleman with prostate cancer diagnosed in December 2016. His PSA was 369 and a prostate biopsy showed a Gleason score 4+4 = 8 and multiple cores. He did not have evidence of metastatic disease at presentation.    Prior Therapy: He was started on androgen deprivation therapy and he remains on it under the care of Dr. Diona Fanti. His PSA decreased to 31 in 2017. His PSA in November 2017 was 41 and in March 2018 was 29. CT scan of the abdomen and pelvis showed possible sclerotic bone lesions indicating metastatic disease.   Current therapy: Androgen deprivation only.   Interim History: [name] [name] presents today for a follow-up visit. Since the last visit, he reports no changes in his health. He remains independent and attends to activities of daily living. He is able to drive and make to his appointments without any decline. He denied any falls or syncope. He denied any pathological fractures or bone pain. He denied any weight loss or appetite changes. He denied any urinary symptoms or excessive fatigue.  He does not report any headaches, blurry vision, syncope or seizures. He hadn't reported any fevers, chills, sweats or weight loss. He does not report any chest pain, palpitation, orthopnea or leg edema. He does not report any cough, wheezing or hemoptysis. He does not report any nausea, vomiting or abdominal pain. He does not report any constipation, diarrhea or hematochezia. He does not report any ecchymosis or rash. He does not report any skeletal complaints. Remaining review of system is unremarkable.    Medications: I have reviewed the patient's current medications.  Current Outpatient Prescriptions  Medication Sig Dispense Refill  . amLODipine (NORVASC) 2.5 MG tablet Take 1 tablet (2.5 mg total) by mouth  daily. OVERDUE FOR FOLLOW UP. PLEASE CALL AND SCHEDULE [phone] 30 tablet 0  . calcium acetate (PHOSLO) 667 MG capsule Take 1-2 capsules (667-1,334 mg total) by mouth See admin instructions. Take 2 capsules (1334 mg) with meals and 1 capsule (667 mg) with snacks or a sandwich 180 capsule 1  . cinacalcet (SENSIPAR) 30 MG tablet Take 30 mg by mouth at bedtime.     Marland Kitchen latanoprost (XALATAN) 0.005 % ophthalmic solution Place 1 drop into both eyes at bedtime.    Marland Kitchen leuprolide (LUPRON) 30 MG injection Inject 30 mg into the muscle every 4 (four) months.    . polyethylene glycol (MIRALAX / GLYCOLAX) packet Take 17 g by mouth daily as needed for moderate constipation.    . Polyvinyl Alcohol-Povidone (REFRESH OP) Place 1 drop into both eyes daily as needed (dry eyes).     No current facility-administered medications for this visit.      Allergies: No Known Allergies  Past Medical History, Surgical history, Social history, and Family History were reviewed and updated.  Physical Exam: Blood pressure (!) 161/47, pulse 76, temperature (!) 97.5 F (36.4 C), temperature source Oral, resp. rate 18, height 6\' 1"  (1.854 m), weight 209 lb 11.2 oz (95.1 kg), SpO2 99 %. ECOG: 1 General appearance: alert and cooperative without distress. Head: Normocephalic, without obvious abnormality oral thrush or ulcers. Neck: no adenopathy no neck masses. Lymph nodes: Cervical, supraclavicular, and axillary nodes normal. Heart:regular rate and rhythm, S1, S2 normal, no murmur, click, rub or gallop Lung:chest clear, no wheezing, rales, normal symmetric air entry Abdomin: soft, non-tender, without masses or organomegaly  no shifting dullness or ascites. EXT:no erythema, induration, or nodules   Lab Results: Lab Results  Component Value Date   WBC 4.5 09/29/2016   HGB 12.4 (L) 09/29/2016   HCT 37.4 (L) 09/29/2016   MCV 93.0 09/29/2016   PLT 234 09/29/2016     Chemistry      Component Value Date/Time   NA 143  09/29/2016 0810   K 4.3 09/29/2016 0810   CL 98 (L) 05/29/2015 1215   CO2 31 (H) 09/29/2016 0810   BUN 27.9 (H) 09/29/2016 0810   CREATININE 7.4 (HH) 09/29/2016 0810      Component Value Date/Time   CALCIUM 9.9 09/29/2016 0810   ALKPHOS 141 09/29/2016 0810   AST 21 09/29/2016 0810   ALT 10 09/29/2016 0810   BILITOT 0.48 09/29/2016 0810      Results for [name] [name], [name] [name] (MRN [id number]) as of 10/04/2016 10:12  Ref. Range 07/03/2016 08:42 09/29/2016 08:10  Prostate Specific Ag, Serum Latest Ref Range: 0.0 - 4.0 ng/mL 23.5 (H) 21.8 (H)     Impression and Plan:   83 year old gentleman with the following issues:  1. Prostate cancer diagnosed in December 2016. His PSA was 369 and a prostate biopsy showed a Gleason score 4+4 = 8 and multiple cores. He did not have evidence of metastatic disease at presentation. He was started on androgen deprivation therapy and he remains on it under the care of Dr. Diona Fanti. His PSA decreased to 31 in 2017. His PSA in November 2017 was 41 and in March 2018 was 29. CT scan of the abdomen and pelvis showed possible sclerotic bone lesions indicating metastatic disease.  His PSA in the last 6 months continues to decline on androgen deprivation therapy only. I recommended continuing this therapy only and adding second line hormonal therapy if his PSA starts to rise again.  2. Androgen deprivation therapy: I recommended continuing that indefinitely under the care of Dr. Diona Fanti.  3. Bone directed therapy: He has poor dental hygiene and will need dental clearance before bone directed therapy if he develops clear-cut evidence of metastatic bony disease.  4. Follow-up: Will be in 6 months after repeating a PSA.   Zola Button, MD 9/26/201810:39 AM

## 2016-10-04 NOTE — Telephone Encounter (Signed)
Gave avs and calendar for march 2019 

## 2016-11-28 ENCOUNTER — Encounter: Payer: Self-pay | Admitting: *Deleted

## 2017-03-19 ENCOUNTER — Ambulatory Visit: Payer: Self-pay | Admitting: Surgery

## 2017-03-28 ENCOUNTER — Inpatient Hospital Stay: Payer: Medicare Other | Attending: Oncology

## 2017-03-28 DIAGNOSIS — C61 Malignant neoplasm of prostate: Secondary | ICD-10-CM | POA: Insufficient documentation

## 2017-03-28 DIAGNOSIS — N19 Unspecified kidney failure: Secondary | ICD-10-CM | POA: Insufficient documentation

## 2017-03-28 DIAGNOSIS — E291 Testicular hypofunction: Secondary | ICD-10-CM | POA: Diagnosis not present

## 2017-03-28 DIAGNOSIS — Z992 Dependence on renal dialysis: Secondary | ICD-10-CM | POA: Insufficient documentation

## 2017-03-28 DIAGNOSIS — C7951 Secondary malignant neoplasm of bone: Secondary | ICD-10-CM | POA: Insufficient documentation

## 2017-03-28 LAB — COMPREHENSIVE METABOLIC PANEL
ALT: 8 U/L (ref 0–55)
ANION GAP: 14 — AB (ref 3–11)
AST: 15 U/L (ref 5–34)
Albumin: 3.5 g/dL (ref 3.5–5.0)
Alkaline Phosphatase: 78 U/L (ref 40–150)
BILIRUBIN TOTAL: 0.5 mg/dL (ref 0.2–1.2)
BUN: 40 mg/dL — AB (ref 7–26)
CHLORIDE: 99 mmol/L (ref 98–109)
CO2: 29 mmol/L (ref 22–29)
Calcium: 10.4 mg/dL (ref 8.4–10.4)
Creatinine, Ser: 8.55 mg/dL (ref 0.70–1.30)
GFR calc Af Amer: 6 mL/min — ABNORMAL LOW (ref >60)
GFR, EST NON AFRICAN AMERICAN: 5 mL/min — AB (ref >60)
Glucose, Bld: 100 mg/dL (ref 70–140)
POTASSIUM: 4.8 mmol/L (ref 3.5–5.1)
Sodium: 142 mmol/L (ref 136–145)
Total Protein: 7.8 g/dL (ref 6.4–8.3)

## 2017-03-28 LAB — CBC WITH DIFFERENTIAL/PLATELET
Basophils Absolute: 0 10*3/uL (ref 0.0–0.1)
Basophils Relative: 1 %
Eosinophils Absolute: 0.1 10*3/uL (ref 0.0–0.5)
Eosinophils Relative: 2 %
HEMATOCRIT: 45.2 % (ref 38.4–49.9)
HEMOGLOBIN: 14.6 g/dL (ref 13.0–17.1)
LYMPHS PCT: 34 %
Lymphs Abs: 1.5 10*3/uL (ref 0.9–3.3)
MCH: 30.4 pg (ref 27.2–33.4)
MCHC: 32.3 g/dL (ref 32.0–36.0)
MCV: 94.2 fL (ref 79.3–98.0)
Monocytes Absolute: 0.4 10*3/uL (ref 0.1–0.9)
Monocytes Relative: 8 %
NEUTROS ABS: 2.4 10*3/uL (ref 1.5–6.5)
NEUTROS PCT: 55 %
PLATELETS: 218 10*3/uL (ref 140–400)
RBC: 4.8 MIL/uL (ref 4.20–5.82)
RDW: 14.3 % (ref 11.0–14.6)
WBC: 4.4 10*3/uL (ref 4.0–10.3)

## 2017-03-29 LAB — TESTOSTERONE: Testosterone: 54 ng/dL — ABNORMAL LOW (ref 264–916)

## 2017-03-29 LAB — PROSTATE-SPECIFIC AG, SERUM (LABCORP): Prostate Specific Ag, Serum: 63.5 ng/mL — ABNORMAL HIGH (ref 0.0–4.0)

## 2017-04-04 ENCOUNTER — Telehealth: Payer: Self-pay | Admitting: Pharmacist

## 2017-04-04 ENCOUNTER — Inpatient Hospital Stay (HOSPITAL_BASED_OUTPATIENT_CLINIC_OR_DEPARTMENT_OTHER): Payer: Medicare Other | Admitting: Oncology

## 2017-04-04 ENCOUNTER — Telehealth: Payer: Self-pay | Admitting: Oncology

## 2017-04-04 VITALS — BP 155/53 | HR 71 | Temp 97.7°F | Resp 18 | Wt 208.1 lb

## 2017-04-04 DIAGNOSIS — E291 Testicular hypofunction: Secondary | ICD-10-CM

## 2017-04-04 DIAGNOSIS — C7951 Secondary malignant neoplasm of bone: Secondary | ICD-10-CM | POA: Diagnosis not present

## 2017-04-04 DIAGNOSIS — Z992 Dependence on renal dialysis: Secondary | ICD-10-CM

## 2017-04-04 DIAGNOSIS — C61 Malignant neoplasm of prostate: Secondary | ICD-10-CM

## 2017-04-04 DIAGNOSIS — N19 Unspecified kidney failure: Secondary | ICD-10-CM

## 2017-04-04 MED ORDER — ENZALUTAMIDE 40 MG PO CAPS
160.0000 mg | ORAL_CAPSULE | Freq: Every day | ORAL | 0 refills | Status: DC
Start: 2017-04-04 — End: 2017-04-04

## 2017-04-04 MED ORDER — ENZALUTAMIDE 40 MG PO CAPS: 160.0000 mg | ORAL_CAPSULE | Freq: Every day | ORAL | 0 refills | Status: DC

## 2017-04-04 NOTE — Telephone Encounter (Signed)
Oral Oncology Patient Advocate Encounter  Prior Authorization for [name] [name] has been approved.    PA# [id number] Effective dates: 04/04/2017 through 01/08/2018  Oral Oncology Clinic will continue to follow.   [facility] Patient Advocate [phone] 04/04/2017 12:13 PM

## 2017-04-04 NOTE — Progress Notes (Signed)
Hematology and Oncology Follow Up Visit  [name] [name] [id number] 1930/10/01 84 y.o. 04/04/2017 8:25 AM [name] [name], MDElkins, [name] [name], *   Principle Diagnosis: 84 year old man with advanced prostate cancer with disease to the bone.  He presented with PSA of 369 and a prostate biopsy showed a Gleason score 4+4 = 8 and multiple cores.  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   Prior Therapy: Androgen deprivation therapy and he remains on it under the care of Dr. Diona Fanti. His PSA decreased to 31 in 2017. His PSA in November 2017 was 41 and in March 2018 was 29.    Current therapy: Under consideration to start second line hormone therapy.  Interim History: [name] is here for a follow-up visit.  He reports no major changes since the last visit.  He is scheduled to have thyroid surgery next week but he is completely asymptomatic at this time.  He denies any back pain for the pain.  He denied any decline in his performance status or activity level.  He continues to eat well without any decline in his weight.  He continues to drive and attends to activities of daily living without any decline in ability to do so.  He denies any urination difficulties.  He does not report any headaches, blurry vision, syncope or seizures. He denied any fevers, chills, sweats or weight loss. He does not report any chest pain, palpitation, orthopnea or leg edema. He does not report any cough, wheezing or hemoptysis. He does not report any nausea, vomiting or abdominal pain. He does not report any constipation, diarrhea or hematochezia. He does not report any ecchymosis or rash. He denies any arthralgias or myalgias.  He denies any lymphadenopathy or petechiae.. Remaining review of system is negative.    Medications: I have reviewed the patient's current medications.  Current Outpatient Medications  Medication Sig Dispense Refill  . amLODipine (NORVASC) 2.5 MG tablet Take 1 tablet  (2.5 mg total) by mouth daily. OVERDUE FOR FOLLOW UP. PLEASE CALL AND SCHEDULE [phone] 30 tablet 0  . calcium acetate (PHOSLO) 667 MG capsule Take 1-2 capsules (667-1,334 mg total) by mouth See admin instructions. Take 2 capsules (1334 mg) with meals and 1 capsule (667 mg) with snacks or a sandwich 180 capsule 1  . cinacalcet (SENSIPAR) 30 MG tablet Take 30 mg by mouth at bedtime.     Marland Kitchen latanoprost (XALATAN) 0.005 % ophthalmic solution Place 1 drop into both eyes at bedtime.    Marland Kitchen leuprolide (LUPRON) 30 MG injection Inject 30 mg into the muscle every 4 (four) months.    . polyethylene glycol (MIRALAX / GLYCOLAX) packet Take 17 g by mouth daily as needed for moderate constipation.    . Polyvinyl Alcohol-Povidone (REFRESH OP) Place 1 drop into both eyes daily as needed (dry eyes).     No current facility-administered medications for this visit.      Allergies: No Known Allergies  Past Medical History, Surgical history, Social history, and Family History were reviewed and updated.  Physical Exam: Blood pressure (!) 155/53, pulse 71, temperature 97.7 F (36.5 C), resp. rate 18, weight 208 lb 2 oz (94.4 kg), SpO2 100 %.   ECOG: 1 General appearance: Well-appearing gentleman appeared comfortable. Head: Atraumatic without abnormalities Eyes: No scleral icterus. Oropharynx: Without thrush or ulcers. Lymph nodes: Cervical, supraclavicular, and axillary nodes normal. Heart: Regular rate and rhythm without murmurs rubs or gallops. Lung: Clear to auscultation without any wheezes or dullness to percussion. Abdomin:  Soft, nontender without any rebound or guarding. Musculoskeletal: No joint deformity or effusion. Skin: No rashes or lesions.   Lab Results: Lab Results  Component Value Date   WBC 4.4 03/28/2017   HGB 14.6 03/28/2017   HCT 45.2 03/28/2017   MCV 94.2 03/28/2017   PLT 218 03/28/2017     Chemistry      Component Value Date/Time   NA 142 03/28/2017 0830   NA 143  09/29/2016 0810   K 4.8 03/28/2017 0830   K 4.3 09/29/2016 0810   CL 99 03/28/2017 0830   CO2 29 03/28/2017 0830   CO2 31 (H) 09/29/2016 0810   BUN 40 (H) 03/28/2017 0830   BUN 27.9 (H) 09/29/2016 0810   CREATININE 8.55 (HH) 03/28/2017 0830   CREATININE 7.4 (HH) 09/29/2016 0810      Component Value Date/Time   CALCIUM 10.4 03/28/2017 0830   CALCIUM 9.9 09/29/2016 0810   ALKPHOS 78 03/28/2017 0830   ALKPHOS 141 09/29/2016 0810   AST 15 03/28/2017 0830   AST 21 09/29/2016 0810   ALT 8 03/28/2017 0830   ALT 10 09/29/2016 0810   BILITOT 0.5 03/28/2017 0830   BILITOT 0.48 09/29/2016 0810       Results for [name] [name], [name] [name] (MRN [id number]) as of 04/04/2017 08:11  Ref. Range 09/29/2016 08:10 03/28/2017 08:32  Prostate Specific Ag, Serum Latest Ref Range: 0.0 - 4.0 ng/mL 21.8 (H) 63.5 (H)     Impression and Plan:   84 year old gentleman with the following issues:  1.  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  He was initially diagnosed in December 2016 with PSA of 369.  His PSA nadir was 21 in September 2018 and currently up to 63.5.  The natural course of this disease was reviewed today with the patient in detail.  His PSA has tripled and 6 months and likely will require additional therapy.  Prior to starting any additional therapy staging workup will be needed which include a CT scan and a bone scan.  Depending on these findings options of therapy will be reviewed.  This would include Zytiga, Xtandi and systemic chemotherapy.  Risks and benefits and rationale for using these medications were discussed today and he would be a good candidate for Xtandi.  After discussion today, he is agreeable to proceed with Xtandi at 160 mg daily.  We will tentatively start this after his thyroid surgery scheduled for the first week of April.  2. Androgen deprivation therapy: Despite his castration resistant disease, I recommended continuing this therapy indefinitely.  He  continues to receive Lupron at 30 mg under the care of Dr. Diona Fanti.  3. Bone directed therapy: He is currently receiving Xgeva under the care of Dr. Diona Fanti.  4.  Renal failure: Continues to be hemodialysis dependent but remains in reasonable health.  5. Follow-up: Will be in 6 weeks to follow his progress and assess for any complications.  25  minutes was spent with the patient face-to-face today.  More than 50% of time was dedicated to patient counseling, education and coordination of his multifaceted care.    Zola Button, MD 3/27/20198:25 AM

## 2017-04-04 NOTE — Telephone Encounter (Signed)
Oral Oncology Patient Advocate Encounter  Received notification from [place] that prior authorization for Gillermina Phy is required.  PA submitted on CoverMyMeds Key [id number] Status is pending  Oral Oncology Clinic will continue to follow.  [place] Patient Advocate [phone] 04/04/2017 12:12 PM

## 2017-04-04 NOTE — Telephone Encounter (Signed)
Oral Oncology Pharmacist Encounter  Received notification from [facility] that patient has approximate $2500 co-pay for first fill of Xtandi. This is prohibitively expensive for the patient. There are no copayment assistance grants available for patient's diagnosis at this time.  Completed application for Xtandi support solutions (XSS) faxed to [phone] to try to obtain Xtandi at $0 out-of-pocket cost to patient.  I called and updated patient about need for manufacturer assistance application. We will reach out to patient with any information that we received from the manufacturer.  This encounter will continue to be updated until final determination.  Patient knows to call the office with any additional questions or concerns.  Oral Oncology Clinic will continue to follow.  Johny Drilling, PharmD, BCPS, BCOP 04/04/2017 1:51 PM Oral Oncology Clinic [phone]

## 2017-04-04 NOTE — Telephone Encounter (Signed)
Oral Oncology Pharmacist Encounter  Received new prescription for Xtandi (enzalutamide) for the treatment of metastatic, castration resistant prostate cancer in conjunction with Lupron injections, planned duration until disease progression or unacceptable toxicity.  Labs from 03/28/2017 assessed, okay for treatment. Noted extremely high serum creatinine, no dose adjustment needed for renal dysfunction.  Noted patient on dialysis.  Current medication list in Epic reviewed, no significant DDIs with Xtandi identified.  Prescription has been e-scribed to the [facility] for benefits analysis and approval.  Oral Oncology Clinic will continue to follow for insurance authorization, copayment issues, initial counseling and start date.  Johny Drilling, PharmD, BCPS, BCOP 04/04/2017 11:04 AM Oral Oncology Clinic [phone]

## 2017-04-04 NOTE — Telephone Encounter (Signed)
Scheduled appt per 3/27 los- Gave patient AVS and calender per los.  

## 2017-04-04 NOTE — Telephone Encounter (Signed)
Oral Chemotherapy Pharmacist Encounter   I spoke with patient in office for overview of: Xtandi.   Counseled patient on administration, dosing, side effects, monitoring, drug-food interactions, safe handling, storage, and disposal.  Patient will take Xtandi 40mg  capsules, 4 capsules (160mg ) by mouth once daily without regard to food.  Xtandi start date: TBD, pending medication acquisition Per MD patient is to start Xtandi after his thyroid surgery scheduled for 04/16/2017  Side effects include but not limited to: peripheral edema, GI upset, hypertension, hot flashes, fatigue, falls/fractures, and arthralgias.   Patient instructed about small risk of seizures with Xtandi treatment.  Reviewed with patient importance of keeping a medication schedule and plan for any missed doses.  Mr. Sakuma voiced understanding and appreciation.   All questions answered. Medication reconciliation performed and medication/allergy list updated.  We will follow-up with patient after insurance authorization has been obtained and copayment is known.  Patient knows to call the office with questions or concerns.  Oral Oncology Clinic will continue to follow.  Thank you,  Johny Drilling, PharmD, BCPS, BCOP 04/04/2017   11:17 AM Oral Oncology Clinic [phone]

## 2017-04-09 ENCOUNTER — Ambulatory Visit (HOSPITAL_COMMUNITY)
Admission: RE | Admit: 2017-04-09 | Discharge: 2017-04-09 | Disposition: A | Discharge status: Home / Self Care | Payer: Medicare Other | Source: Ambulatory Visit | Attending: Oncology | Admitting: Oncology

## 2017-04-09 ENCOUNTER — Encounter (HOSPITAL_COMMUNITY): Payer: Self-pay

## 2017-04-09 DIAGNOSIS — K5730 Diverticulosis of large intestine without perforation or abscess without bleeding: Secondary | ICD-10-CM | POA: Diagnosis not present

## 2017-04-09 DIAGNOSIS — K8020 Calculus of gallbladder without cholecystitis without obstruction: Secondary | ICD-10-CM | POA: Diagnosis not present

## 2017-04-09 DIAGNOSIS — R911 Solitary pulmonary nodule: Secondary | ICD-10-CM | POA: Diagnosis not present

## 2017-04-09 DIAGNOSIS — I700 Atherosclerosis of aorta: Secondary | ICD-10-CM | POA: Diagnosis not present

## 2017-04-09 DIAGNOSIS — C7951 Secondary malignant neoplasm of bone: Secondary | ICD-10-CM | POA: Diagnosis not present

## 2017-04-09 DIAGNOSIS — I2510 Atherosclerotic heart disease of native coronary artery without angina pectoris: Secondary | ICD-10-CM | POA: Insufficient documentation

## 2017-04-09 DIAGNOSIS — C61 Malignant neoplasm of prostate: Secondary | ICD-10-CM

## 2017-04-09 HISTORY — DX: Secondary malignant neoplasm of bone: C79.51

## 2017-04-09 HISTORY — DX: Malignant neoplasm of prostate: C61

## 2017-04-10 NOTE — Pre-Procedure Instructions (Signed)
[name] [name]  04/10/2017      [facility] [place]  - [address] [address] [place] [address] Phone: [phone] Fax: [phone]    Your procedure is scheduled on April 16, 2017.  Report to [facility] Admitting at 530 AM.  Call this number if you have problems the morning of surgery:  [phone]   Remember:  Do not eat food or drink liquids after midnight.  Take these medicines the morning of surgery with A SIP OF WATER amlodipine (norvasc), enzalutamide Gillermina Phy), eye drops.  7 days prior to surgery STOP taking any Aspirin (unless otherwise instructed by your surgeon), Aleve, Naproxen, Ibuprofen, Motrin, Advil, Goody's, BC's, all herbal medications, fish oil, and all vitamins  Continue all other medications as instructed by your physician except follow the above medication instructions before surgery    How to Manage Your Diabetes Before and After Surgery  Why is it important to control my blood sugar before and after surgery? . Improving blood sugar levels before and after surgery helps healing and can limit problems. . A way of improving blood sugar control is eating a healthy diet by: o  Eating less sugar and carbohydrates o  Increasing activity/exercise o  Talking with your doctor about reaching your blood sugar goals . High blood sugars (greater than 180 mg/dL) can raise your risk of infections and slow your recovery, so you will need to focus on controlling your diabetes during the weeks before surgery. . Make sure that the doctor who takes care of your diabetes knows about your planned surgery including the date and location.  How do I manage my blood sugar before surgery? . Check your blood sugar at least 4 times a day, starting 2 days before surgery, to make sure that the level is not too high or low. o Check your blood sugar the morning of your surgery when you wake up and every 2 hours  until you get to the Short Stay unit. . If your blood sugar is less than 70 mg/dL, you will need to treat for low blood sugar: o Do not take insulin. o Treat a low blood sugar (less than 70 mg/dL) with  cup of clear juice (cranberry or apple), 4 glucose tablets, OR glucose gel. Recheck blood sugar in 15 minutes after treatment (to make sure it is greater than 70 mg/dL). If your blood sugar is not greater than 70 mg/dL on recheck, call [phone] o  for further instructions. . Report your blood sugar to the short stay nurse when you get to Short Stay.  . If you are admitted to the hospital after surgery: o Your blood sugar will be checked by the staff and you will probably be given insulin after surgery (instead of oral diabetes medicines) to make sure you have good blood sugar levels. o The goal for blood sugar control after surgery is 80-180 mg/dL.  Reviewed and Endorsed by [facility] Patient Education Committee, August 2015               Do not wear jewelry..  Do not wear lotions, powders, or colognes, or deodorant.  Men may shave face and neck.  Do not bring valuables to the hospital.  [facility] is not responsible for any belongings or valuables.  Contacts, dentures or bridgework may not be worn into surgery.  Leave your suitcase in the car.  After surgery it may be brought to  your room.  For patients admitted to the hospital, discharge time will be determined by your treatment team.  Patients discharged the day of surgery will not be allowed to drive home.    [place]- Preparing For Surgery  Before surgery, you can play an important role. Because skin is not sterile, your skin needs to be as free of germs as possible. You can reduce the number of germs on your skin by washing with CHG (chlorahexidine gluconate) Soap before surgery.  CHG is an antiseptic cleaner which kills germs and bonds with the skin to continue killing germs even after washing.  Please do not use if you  have an allergy to CHG or antibacterial soaps. If your skin becomes reddened/irritated stop using the CHG.  Do not shave (including legs and underarms) for at least 48 hours prior to first CHG shower. It is OK to shave your face.  Please follow these instructions carefully.   1. Shower the NIGHT BEFORE SURGERY and the MORNING OF SURGERY with CHG.   2. If you chose to wash your hair, wash your hair first as usual with your normal shampoo.  3. After you shampoo, rinse your hair and body thoroughly to remove the shampoo.  4. Use CHG as you would any other liquid soap. You can apply CHG directly to the skin and wash gently with a scrungie or a clean washcloth.   5. Apply the CHG Soap to your body ONLY FROM THE NECK DOWN.  Do not use on open wounds or open sores. Avoid contact with your eyes, ears, mouth and genitals (private parts). Wash Face and genitals (private parts)  with your normal soap.  6. Wash thoroughly, paying special attention to the area where your surgery will be performed.  7. Thoroughly rinse your body with warm water from the neck down.  8. DO NOT shower/wash with your normal soap after using and rinsing off the CHG Soap.  9. Pat yourself dry with a CLEAN TOWEL.  10. Wear CLEAN PAJAMAS to bed the night before surgery, wear comfortable clothes the morning of surgery  11. Place CLEAN SHEETS on your bed the night of your first shower and DO NOT SLEEP WITH PETS.   Day of Surgery: Do not apply any deodorants/lotions. Please wear clean clothes to the hospital/surgery center.    Please read over the following fact sheets that you were given. Pain Booklet, Coughing and Deep Breathing and Surgical Site Infection Prevention

## 2017-04-11 ENCOUNTER — Encounter (HOSPITAL_COMMUNITY): Payer: Self-pay | Admitting: Vascular Surgery

## 2017-04-11 ENCOUNTER — Encounter (HOSPITAL_COMMUNITY)
Admission: RE | Admit: 2017-04-11 | Discharge: 2017-04-11 | Disposition: A | Discharge status: Home / Self Care | Payer: Medicare Other | Source: Ambulatory Visit | Attending: Surgery | Admitting: Surgery

## 2017-04-11 ENCOUNTER — Telehealth: Payer: Self-pay | Admitting: Pharmacy Technician

## 2017-04-11 ENCOUNTER — Ambulatory Visit (HOSPITAL_COMMUNITY)
Admission: RE | Admit: 2017-04-11 | Discharge: 2017-04-11 | Disposition: A | Discharge status: Home / Self Care | Payer: Medicare Other | Source: Ambulatory Visit | Attending: Anesthesiology | Admitting: Anesthesiology

## 2017-04-11 ENCOUNTER — Encounter (HOSPITAL_COMMUNITY): Payer: Self-pay | Admitting: Anesthesiology

## 2017-04-11 ENCOUNTER — Encounter (HOSPITAL_COMMUNITY): Payer: Self-pay

## 2017-04-11 DIAGNOSIS — Z79899 Other long term (current) drug therapy: Secondary | ICD-10-CM | POA: Insufficient documentation

## 2017-04-11 DIAGNOSIS — M109 Gout, unspecified: Secondary | ICD-10-CM | POA: Insufficient documentation

## 2017-04-11 DIAGNOSIS — N2581 Secondary hyperparathyroidism of renal origin: Secondary | ICD-10-CM | POA: Insufficient documentation

## 2017-04-11 DIAGNOSIS — I700 Atherosclerosis of aorta: Secondary | ICD-10-CM | POA: Diagnosis not present

## 2017-04-11 DIAGNOSIS — Z01810 Encounter for preprocedural cardiovascular examination: Secondary | ICD-10-CM | POA: Diagnosis not present

## 2017-04-11 DIAGNOSIS — Z01818 Encounter for other preprocedural examination: Secondary | ICD-10-CM

## 2017-04-11 DIAGNOSIS — I120 Hypertensive chronic kidney disease with stage 5 chronic kidney disease or end stage renal disease: Secondary | ICD-10-CM | POA: Diagnosis not present

## 2017-04-11 DIAGNOSIS — Z01812 Encounter for preprocedural laboratory examination: Secondary | ICD-10-CM | POA: Insufficient documentation

## 2017-04-11 DIAGNOSIS — Z87891 Personal history of nicotine dependence: Secondary | ICD-10-CM | POA: Diagnosis not present

## 2017-04-11 DIAGNOSIS — D631 Anemia in chronic kidney disease: Secondary | ICD-10-CM | POA: Insufficient documentation

## 2017-04-11 DIAGNOSIS — C61 Malignant neoplasm of prostate: Secondary | ICD-10-CM | POA: Diagnosis not present

## 2017-04-11 DIAGNOSIS — N186 End stage renal disease: Secondary | ICD-10-CM | POA: Insufficient documentation

## 2017-04-11 DIAGNOSIS — E1122 Type 2 diabetes mellitus with diabetic chronic kidney disease: Secondary | ICD-10-CM | POA: Insufficient documentation

## 2017-04-11 DIAGNOSIS — Z992 Dependence on renal dialysis: Secondary | ICD-10-CM | POA: Insufficient documentation

## 2017-04-11 DIAGNOSIS — I2510 Atherosclerotic heart disease of native coronary artery without angina pectoris: Secondary | ICD-10-CM | POA: Insufficient documentation

## 2017-04-11 HISTORY — DX: Dependence on renal dialysis: Z99.2

## 2017-04-11 LAB — CBC
HEMATOCRIT: 44.7 % (ref 39.0–52.0)
HEMOGLOBIN: 14.3 g/dL (ref 13.0–17.0)
MCH: 29.7 pg (ref 26.0–34.0)
MCHC: 32 g/dL (ref 30.0–36.0)
MCV: 92.9 fL (ref 78.0–100.0)
Platelets: 220 10*3/uL (ref 150–400)
RBC: 4.81 MIL/uL (ref 4.22–5.81)
RDW: 14.5 % (ref 11.5–15.5)
WBC: 5.1 10*3/uL (ref 4.0–10.5)

## 2017-04-11 LAB — BASIC METABOLIC PANEL
ANION GAP: 12 (ref 5–15)
BUN: 26 mg/dL — AB (ref 6–20)
CHLORIDE: 99 mmol/L — AB (ref 101–111)
CO2: 29 mmol/L (ref 22–32)
Calcium: 10.4 mg/dL — ABNORMAL HIGH (ref 8.9–10.3)
Creatinine, Ser: 8.11 mg/dL — ABNORMAL HIGH (ref 0.61–1.24)
GFR calc Af Amer: 6 mL/min — ABNORMAL LOW (ref >60)
GFR calc non Af Amer: 5 mL/min — ABNORMAL LOW (ref >60)
GLUCOSE: 98 mg/dL (ref 65–99)
POTASSIUM: 5.4 mmol/L — AB (ref 3.5–5.1)
Sodium: 140 mmol/L (ref 135–145)

## 2017-04-11 LAB — GLUCOSE, CAPILLARY: Glucose-Capillary: 88 mg/dL (ref 65–99)

## 2017-04-11 LAB — HEMOGLOBIN A1C
Hgb A1c MFr Bld: 5.9 % — ABNORMAL HIGH (ref 4.8–5.6)
Mean Plasma Glucose: 122.63 mg/dL

## 2017-04-11 MED ORDER — CHLORHEXIDINE GLUCONATE CLOTH 2 % EX PADS: 6.0000 | MEDICATED_PAD | Freq: Once | CUTANEOUS | Status: DC

## 2017-04-11 NOTE — Progress Notes (Signed)
Anesthesia PAT Evaluation: Patient is an 84 year old male scheduled for total parathyroidectomy with autotransplant to left forearm on 04/16/17 by Dr. Armandina Gemma. Dx: Secondary hyperparathyroidism of renal origin.  History includes CAD/NSTEMI 01/2009 & 12/2012 (s/p CABG: LIMA-LAD, SVG-INT, SVG-OM, SVG-PDA) 12/31/12, ESRD (TTS [facility]; right brachiocephalic AVF 54/65/68), advanced prostate cancer (diagnosed 12/2014), former smoker (quit '97), hypertension, dyslipidemia, anemia of chronic disease, diabetes mellitus type 2 (diet controlled), gout.  - PCP is Dr. Claris Gower. He thinks last visit was ~ 6-7 months ago. - Nephrologist is Dr. Erling Cruz (although sees different providers at hemodialysis center).  - HEM-ONC  is Dr. Zola Button, last visit 04/04/17.  - Cardiologist is Dr. Minus Breeding, last seen 03/02/14.  - Urologist is Dr. Franchot Gallo.   Meds include amlodipine, Phoslo, Sensipar, Xtandi (to be started after surgery once approved), Xalatan ophthalmic, Lupron injections (~ Q 4 months), MVI. He received another monthly injection--he didn't recall the name, but oncology notes mention Xgeva.   BP (!) 150/48   Pulse 67   Temp 36.5 C (Oral)   Resp 18   Ht 6' (1.829 m)   Wt 208 lb 8 oz (94.6 kg)   SpO2 95%   BMI 28.28 kg/m   Patient is a pleasant black male. Wife at side. He is in no acute distress. No conversational dyspnea. He denied chest pain, palpitations, edema, SOB, syncope. He tries to stay active--he mowed his 1/2 acre yard last week on a riding lawnmower. When weather is nice he is also able to weed eat and change his oil. He does not have any significant exertional dyspnea with this level of activity. He does not feel he is quite as active as he was a year ago, but doesn't describe a change due to any CV symptoms. He was able to walk from the hospital entrance to PAT without CV symptoms. He tolerated hemodialysis for 4 hours 3X/week and is able to go about his usual  activities afterwards. Heart RRR, no murmur. Lungs clear. No carotid bruits noted. No ankle edema. He has a RUE AVF with two aneurysmal segments, but without bleeding or erosion. AVF with + thrill. He has upper dentures and is missing several lower back teeth (molars). He denied any issues with anesthesia.   EKG 04/11/17: NSR, LAFB, minimal voltage criteria for LVH, may be normal variant. Cannot rule out inferior infarct (masked by bifascicular block?), age undetermined. T wave abnormality, consider anterolateral ischemia. T wave abnormalities not as apparent on 06/11/14 EKG, but negative T waves in V3-6 were present on 03/02/14 tracing and in inferior leads to a slightly lesser extent.  Intra-op (CABG) TEE 12/31/12:  Study Conclusions - Left ventricle: The cavity size was grosslynormal. Wall thickness was grossly normal. The estimated ejection fraction was in the range of 45% to 50%. Mild hypokinesis of the inferolateral and inferior myocardium. - Aortic valve: Noncoronary cusp mobility was moderately restricted. Trivial regurgitation originating from the central coaptation point. - Mitral valve: No evidence of vegetation. - Right atrium: No evidence of thrombus in the atrial cavity or appendage. - Atrial septum: No defect or patent foramen ovale was identified. Echo contrast study showed no right-to-left atrial level shunt, following an increase in RA pressure induced by provocative maneuvers. - Tricuspid valve: No evidence of vegetation. Impressions: - LV function improvement from pre-bypass images. Post bypass: - LV size was normal. - LV global systolic function was normal and improved from the prior stage. The estimated LV ejection  fraction was 55% to 60%. - Hypokinesis of the inferolateral and inferior LV myocardium, improved from baseline.  Last cardiac cath was 12/30/12 prior to CABG and showed 3V CAD and mild segmental LV systolic dysfunction, EF 56%,  inferior wall hypokinesis.  Carotid U/S 12/30/12: Summary: - The vertebral arteries appear patent with antegrade flow. - Findings consistent with less than 39 percent stenosis involving the right internal carotid artery and the left internal carotid artery.  CXR 04/11/17: IMPRESSION: No acute cardiopulmonary process. Aortic Atherosclerosis (ICD10-I70.0).  Preoperative labs noted. Cr 8.11. Glucose 98. Ca 10.4. CBC WNL. A1c 5.9. He will need an ISTAT4 prior to surgery given ESRD history.  Patient with secondary hyperparathyroidism and needs parathyroidectomy. He is 84 years old with known ESRD, CAD, and advanced prostate cancer. He has not having any acute CV issues and is tolerating hemodialysis, but has not seen cardiology in > 3 years. No symptoms today to suggest acute CHF. His EKG does show some T wave inversions that may be worse, but I don't think they are completely new when compared to his 03/02/14 tracing. Discussed with anesthesiologist Dr. Adele Barthel, would recommend preoperative cardiology evaluation. I notified [facility] triage nurse Sunday Spillers and Mr. Suder. [facility] staff will work on getting him an appointment and can update patient further.  George Hugh [facility] Short Stay Center/Anesthesiology Phone [phone] 04/11/2017 3:21 PM

## 2017-04-11 NOTE — Telephone Encounter (Signed)
Oral Oncology Patient Advocate Encounter  Copay assistance grant funding was obtained for the patient.   The application for medication assistance has been suspended.    [name] [name]. [name] [name], [place] Patient [place] [phone] 04/11/2017 12:13 PM

## 2017-04-11 NOTE — Telephone Encounter (Signed)
Oral Oncology Patient Advocate Encounter  Was successful in securing patient an $ 7500 grant from Patient [place] [facility]) to provide copayment coverage for his Xtandi.  This will keep the out of pocket expense at $0.    I have spoken with the patient.    The billing information is as follows and has been shared with [place].   Member ID: [id number] Group ID: [id number] RxBin: [id number] Dates of Eligibility: 01/09/2017 through 04/09/2018  I have made arrangements with [name] [name] for him to pick up his medication on 04-20-2017 after his appointment in the office.    [name]. Melynda Keller, [place] Patient [place] [phone] 04/11/2017 12:12 PM

## 2017-04-12 ENCOUNTER — Telehealth: Payer: Self-pay

## 2017-04-12 NOTE — Telephone Encounter (Signed)
   [facility] Pre-operative Risk Assessment    Request for surgical clearance:  1. What type of surgery is being performed? Parathyroidectomy with Auto Transplant to left Forearm   2. When is this surgery scheduled? TBD   3. What type of clearance is required (medical clearance vs. Pharmacy clearance to hold med vs. Both)? Medical clearance  4. Are there any medications that need to be held prior to surgery and how long?n/a   5. Practice name and name of physician performing surgery? [facility] Dr. Armandina Gemma  6. What is your office phone and fax number? Phone: [phone] Fax: [phone] Attn: Mammie Lorenzo, LPN   7. Anesthesia type (None, local, MAC, general) ? General   Newt Minion 04/12/2017, 9:48 AM  _________________________________________________________________   (provider comments below)

## 2017-04-13 NOTE — Telephone Encounter (Signed)
   Primary Cardiologist:James Hochrein, MD last seen 2016  Chart reviewed as part of pre-operative protocol coverage. Because of Marcella Dunnaway past medical history and time since last visit, he/she will require a follow-up visit in order to better assess preoperative cardiovascular risk.  Pre-op covering staff: - Please schedule appointment and call patient to inform them. - Please contact requesting surgeon's office via preferred method (i.e, phone, fax) to inform them of need for appointment prior to surgery.  Rosaria Ferries, PA-C  04/13/2017, 5:10 PM

## 2017-04-16 ENCOUNTER — Inpatient Hospital Stay (HOSPITAL_COMMUNITY): Admission: RE | Admit: 2017-04-16 | Payer: Medicare Other | Source: Ambulatory Visit | Admitting: Surgery

## 2017-04-16 ENCOUNTER — Encounter (HOSPITAL_COMMUNITY): Admission: RE | Payer: Self-pay | Source: Ambulatory Visit

## 2017-04-16 SURGERY — PARATHYROIDECTOMY, WITH AUTOGRAFT TRANSPLANT
Anesthesia: General

## 2017-04-16 NOTE — Telephone Encounter (Signed)
Forwarded to requesting party via EPIC 

## 2017-04-16 NOTE — Telephone Encounter (Signed)
Called pt and scheduled appt for clearance 4-29

## 2017-04-19 MED FILL — XTANDI 40 MG CAPSULE: 40 | 30 days supply | Qty: 120 | Fill #0

## 2017-04-20 ENCOUNTER — Ambulatory Visit (HOSPITAL_COMMUNITY): Payer: Medicare Other

## 2017-04-20 ENCOUNTER — Encounter (HOSPITAL_COMMUNITY): Payer: Medicare Other

## 2017-04-20 ENCOUNTER — Telehealth: Payer: Self-pay | Admitting: *Deleted

## 2017-04-20 NOTE — Telephone Encounter (Signed)
Called patient as requested.  Wife Earlie Server reports "he's at [facility] to pick up prescription".  Provided and spelled pharmacy name and address as requested for a written note she will give upon his arrival.  Triage received note from registration awaiting return call from [place] as advised by collaborative to call.  Patient left [place], leaving note requesting nurse call to [phone], he'll come back for medication he was called to pick up today. See 04-11-2017 oral oncology note.

## 2017-05-07 ENCOUNTER — Encounter: Payer: Self-pay | Admitting: Physician Assistant

## 2017-05-07 ENCOUNTER — Ambulatory Visit: Payer: Medicare Other | Admitting: Physician Assistant

## 2017-05-07 VITALS — BP 152/52 | HR 66 | Ht 72.0 in | Wt 207.0 lb

## 2017-05-07 DIAGNOSIS — I10 Essential (primary) hypertension: Secondary | ICD-10-CM

## 2017-05-07 DIAGNOSIS — E785 Hyperlipidemia, unspecified: Secondary | ICD-10-CM | POA: Diagnosis not present

## 2017-05-07 DIAGNOSIS — Z01818 Encounter for other preprocedural examination: Secondary | ICD-10-CM

## 2017-05-07 DIAGNOSIS — I25709 Atherosclerosis of coronary artery bypass graft(s), unspecified, with unspecified angina pectoris: Secondary | ICD-10-CM

## 2017-05-07 DIAGNOSIS — Z992 Dependence on renal dialysis: Secondary | ICD-10-CM

## 2017-05-07 DIAGNOSIS — R011 Cardiac murmur, unspecified: Secondary | ICD-10-CM | POA: Diagnosis not present

## 2017-05-07 DIAGNOSIS — N186 End stage renal disease: Secondary | ICD-10-CM

## 2017-05-07 DIAGNOSIS — E119 Type 2 diabetes mellitus without complications: Secondary | ICD-10-CM | POA: Diagnosis not present

## 2017-05-07 MED ORDER — ASPIRIN EC 81 MG PO TBEC: 81.0000 mg | DELAYED_RELEASE_TABLET | Freq: Every day | ORAL | 3 refills | Status: DC

## 2017-05-07 MED ORDER — AMLODIPINE BESYLATE 5 MG PO TABS: 5.0000 mg | ORAL_TABLET | Freq: Every day | ORAL | 3 refills | Status: DC

## 2017-05-07 MED ORDER — ATORVASTATIN CALCIUM 20 MG PO TABS: 20.0000 mg | ORAL_TABLET | Freq: Every day | ORAL | 3 refills | Status: DC

## 2017-05-07 NOTE — Progress Notes (Signed)
Cardiology Office Note    Date:  05/07/2017   ID:  [name] [name], DOB 05-31-1930, MRN [id number]  PCP:  [name] Downing, MD  Cardiologist:  Dr. Percival [name]  Nephrologist: Dr. Florene [name]  Chief Complaint  Patient presents with  . Follow-up    last visit 2016, h/o CAD s/p CABG  . Pre-op Exam    parathyroidectomy by Dr. Harlow [name]    History of Present Illness:  [name] [name] is a 84 y.o. male with PMH of CAD s/p CABG (LIMA to LAD, SVG to intermediate, reverse SVG to OM, reverse SVG to posterior descending artery) 12/31/2012, ESRD on HD TThS, prostate CA, and HTN, HLD, and prediabetes. His original presenting symptom was exertional dyspnea.  His last office visit was in February 2016, his blood pressure was elevated at the time, low-dose amlodipine was added.  Otherwise he denied any chest discomfort at the time.  Patient presents today for cardiology office visit.  He denies any recent chest pain, however he does become more short of breath with exertion.  He has not been followed by cardiology since 2016.  Since that time, he is no longer on aspirin, Lipitor or metoprolol.  It is unclear to me why this 3 medications were discontinued.  On review of the record, it appears aspirin was still on his medication list despite symptomatic anemia in 2017, however by the time he was seen by Dr. Alen Blew in April 2018, aspirin was no longer on his medication list.  It is unclear to me who between this period discontinued his medication.  He will need CBC during the next work-up with oncology to make sure his hemoglobin does not drop on the aspirin.  His blood pressure remain elevated on the 2.5 mg daily of amlodipine, I will increase amlodipine to 5 mg daily.  I will also restart the Lipitor at 20 mg daily, he will need a fasting lipid panel and LFT in 6 to 8 weeks.  On physical exam, he has a heart murmur near the aortic valve area, he has never known to have a heart murmur in the past.  I will obtain an  echocardiogram.  Since he never had chest pain even prior to his bypass surgery and his anginal symptom has always been exertional dyspnea, I will proceed with a treadmill nuclear stress test to evaluate cardiac risk prior to the procedure.  He has upcoming visit with Dr. Harlow [name] for parathyroidectomy, cardiac clearance pending depend on the result of the echocardiogram and stress test.   Past Medical History:  Diagnosis Date  . Anemia of chronic disease   . Chest pain   . Dialysis patient ([place])    T, TH, Sat  . Dyslipidemia   . ESRD (end stage renal disease) on dialysis [facility])    "Industrial; T, Thurs, Sat" (12/26/2012)  . Gout   . Hypertension   . Metastatic cancer to bone ([place]) dx'd 04/04/17  . MYOCARDIAL INFARCTION, ACUTE, NON-Q WAVE 04/26/2009   Qualifier: Diagnosis of  By: [name] Spanish, MD, Farrel Gordon    . Obesity   . Prostate CA ([place]) dx'd 12/2014   initial dx  . Prostate cancer ([place]) dx'd 04/04/17   recurrence  . Shortness of breath    "comes up at anytime" (12/26/2012)  . Type II diabetes mellitus ([place])    "diet controlled" (12/26/2012)  . Volume overload     Past Surgical History:  Procedure Laterality Date  . ARTERIOVENOUS GRAFT PLACEMENT Right 12/2008   "upper arm"  .  AV FISTULA PLACEMENT    . CORONARY ARTERY BYPASS GRAFT N/A 12/31/2012   Procedure: CORONARY ARTERY BYPASS GRAFTING times four using left internal mammary and right saphenous vein;  Surgeon: Grace Isaac, MD;  Location: [place];  Service: Open Heart Surgery;  Laterality: N/A;  . INTRAOPERATIVE TRANSESOPHAGEAL ECHOCARDIOGRAM N/A 12/31/2012   Procedure: INTRAOPERATIVE TRANSESOPHAGEAL ECHOCARDIOGRAM;  Surgeon: Grace Isaac, MD;  Location: [place];  Service: Open Heart Surgery;  Laterality: N/A;  . LEFT HEART CATHETERIZATION WITH CORONARY ANGIOGRAM N/A 12/30/2012   Procedure: LEFT HEART CATHETERIZATION WITH CORONARY ANGIOGRAM;  Surgeon: Burnell Blanks, MD;  Location: [facility] CATH LAB;  Service:  Cardiovascular;  Laterality: N/A;    Current Medications: Outpatient Medications Prior to Visit  Medication Sig Dispense Refill  . calcium acetate (PHOSLO) 667 MG capsule Take 1-2 capsules (667-1,334 mg total) by mouth See admin instructions. Take 2 capsules (1334 mg) with meals and 1 capsule (667 mg) with snacks or a sandwich (Patient taking differently: Take 2,001 mg by mouth 3 (three) times daily with meals. ) 180 capsule 1  . cinacalcet (SENSIPAR) 30 MG tablet Take 30 mg by mouth at bedtime.     . enzalutamide (XTANDI) 40 MG capsule Take 4 capsules (160 mg total) by mouth daily. 120 capsule 0  . latanoprost (XALATAN) 0.005 % ophthalmic solution Place 1 drop into both eyes at bedtime.    Marland Kitchen leuprolide (LUPRON) 30 MG injection Inject 30 mg into the muscle every 4 (four) months.    . Multiple Vitamin (DAILY VITE PO) Take 1 tablet by mouth daily.    . Polyethylene Glycol 400 (BLINK TEARS OP) Place 1 drop into both eyes daily as needed (dry eyes).    Marland Kitchen amLODipine (NORVASC) 2.5 MG tablet Take 1 tablet (2.5 mg total) by mouth daily. OVERDUE FOR FOLLOW UP. PLEASE CALL AND SCHEDULE [phone] 30 tablet 0   No facility-administered medications prior to visit.      Allergies:   Patient has no known allergies.   Social History   Socioeconomic History  . Marital status: Married    Spouse name: Not on file  . Number of children: 2  . Years of education: Not on file  . Highest education level: Not on file  Occupational History  . 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 Financial resource strain: Not on file  . Food insecurity:    Worry: Not on file    Inability: Not on file  . Transportation needs:    Medical: Not on file    Non-medical: Not on file  Tobacco Use  . Smoking status: Former Smoker    Packs/day: 1.00    Years: 40.00    Pack years: 40.00    Types: Cigarettes    Last attempt to quit: 06/16/1995    Years since quitting: 21.9  . Smokeless tobacco:  Never Used  Substance and Sexual Activity  . Alcohol use: No    Alcohol/week: 0.0 oz    Comment: 12/26/2012 "none in 30 yr; drank some when I was young"  . Drug use: No  . Sexual activity: Not Currently  Lifestyle  . Physical activity:    Days per week: Not on file    Minutes per session: Not on file  . Stress: Not on file  Relationships  . Social connections:    Talks on phone: Not on file    Gets together: Not on file    Attends religious service: Not  on file    Active member of club or organization: Not on file    Attends meetings of clubs or organizations: Not on file    Relationship status: Not on file  Other Topics Concern  . Not on file  Social History Narrative   He is a retired Administrator. He lives in [place] with his wife. He has two sons     Family History:  The patient's family history includes Diabetes in his sister; Stroke in his mother.   ROS:   Please see the history of present illness.    ROS All other systems reviewed and are negative.   PHYSICAL EXAM:   VS:  BP (!) 152/52   Pulse 66   Ht 6' (1.829 m)   Wt 207 lb (93.9 kg)   BMI 28.07 kg/m    GEN: Well nourished, well developed, in no acute distress  HEENT: normal  Neck: no JVD, carotid bruits, or masses Cardiac: RRR; no rubs, or gallops,no edema  2/6 murmur at RUSB Respiratory:  clear to auscultation bilaterally, normal work of breathing GI: soft, nontender, nondistended, + BS MS: no deformity or atrophy  Skin: warm and dry, no rash Neuro:  Alert and Oriented x 3, Strength and sensation are intact Psych: euthymic mood, full affect  Wt Readings from Last 3 Encounters:  05/07/17 207 lb (93.9 kg)  04/11/17 208 lb 8 oz (94.6 kg)  04/04/17 208 lb 2 oz (94.4 kg)      Studies/Labs Reviewed:   EKG:  EKG is not ordered today.  Recent EKG reviewed, sinus rhythm with T wave inversion in lateral leads.  Recent Labs: 03/28/2017: ALT 8 04/11/2017: BUN 26; Creatinine, Ser 8.11; Hemoglobin 14.3;  Platelets 220; Potassium 5.4; Sodium 140   Lipid Panel    Component Value Date/Time   CHOL 170 12/27/2012 0545   TRIG 136 12/27/2012 0545   HDL 36 (L) 12/27/2012 0545   CHOLHDL 4.7 12/27/2012 0545   VLDL 27 12/27/2012 0545   LDLCALC 107 (H) 12/27/2012 0545    Additional studies/ records that were reviewed today include:   Cath 12/30/2012 Angiographic Findings:  Left main: Normal caliber. Mild plaque.   Left Anterior Descending Artery: Large caliber vessel that courses to the apex. The proximal vessel has diffuse 40% stenosis. The mid vessel has diffuse 80% stenosis. The distal vessel has diffuse 50% stenosis. The first diagonal branch is moderate in caliber with 90% ostial stenosis, proximal 90% stenosis. The second diagonal branch is moderate in caliber with mild plaque disease.   Circumflex Artery: Large caliber vessel with termination into a large obtuse marginal branch. There is a 99% mid stenosis leading into the obtuse marginal branch. The proximal segment of the obtuse marginal branch has a 70% stenosis.   Right Coronary Artery: Large dominant vessel with 70% proximal stenosis, 100% mid stenosis. The distal vessel is seen to fill from left to right collaterals.   Left Ventricular Angiogram: LVEF=45% with inferior wall hypokinesis.   Impression: 1. Severe triple vessel CAD 2. Mild segmental LV systolic dysfunction 3. Unstable angina  Recommendations: Functional 84 yo male with severe multi-vessel CAD. Will ask CT surgery to see for possible CABG. If he is not felt to be a candidate for CABG, would require multi-vessel stenting.     CABG x4 by Dr. Servando Snare 12/31/2012 SURGICAL PROCEDURE:  Urgent coronary artery bypass grafting x4 with left internal mammary to the left anterior descending coronary artery, reverse saphenous vein graft to the intermediate, reverse  saphenous vein graft to the obtuse marginal, reverse saphenous vein graft to the posterior descending  coronary artery with right thigh and calf endo veins harvesting.  ASSESSMENT:    1. Coronary artery disease involving coronary bypass graft of native heart with angina pectoris ([place])   2. ESRD (end stage renal disease) on dialysis ([place])   3. Essential hypertension   4. Hyperlipidemia, unspecified hyperlipidemia type   5. Controlled type 2 diabetes mellitus without complication, without long-term current use of insulin ([place])   6. Heart murmur   7. Preoperative clearance      PLAN:  In order of problems listed above:  1. Preoperative clearance: Prior to parathyroidectomy by Dr. Harlow [name] I will obtain an echocardiogram to assess the heart murmur and also treadmill Myoview.  He is 5 years out from bypass surgery.  He never had any chest pain, his anginal symptom has been exertional dyspnea.  If Myoview is normal and echocardiogram does not show severe valve disease, he is cleared to proceed with surgery.  2. CAD s/p CABG: Restart aspirin and low-dose Lipitor.  He is to be on low-dose metoprolol as well, unclear why this was discontinued.  On the next office visit, consider addition of beta-blocker.  3. Heart murmur: Heart murmur near aortic valve area, obtain echocardiogram  4. End-stage renal disease on dialysis: Followed by Dr. Florene [name]  5. Hypertension: Blood pressure elevated, increase amlodipine to 5 mg daily, target blood pressure range in the 140s, expect blood pressure drop after dialysis therefore will avoid aggressive blood pressure control.  6. Hyperlipidemia: Restart Lipitor, obtain 6 to 8 weeks fasting lipid panel and LFT  7. Prediabetes: Recent hemoglobin A1c 5.9    Medication Adjustments/Labs and Tests Ordered: Current medicines are reviewed at length with the patient today.  Concerns regarding medicines are outlined above.  Medication changes, Labs and Tests ordered today are listed in the Patient Instructions below. Patient Instructions  Medication Instructions:  1.  RESTART Aspirin 81mg  Take 1 tablet once a day 2. INCREASE Norvasc to 5mg  Take 1 tablet once a day 3. START Lipitor 20mg  Take 1 tablet once a daY  Labwork: Your physician recommends that you return for lab work in: CBC-at next visit with Dr Alen Blew Your physician recommends that you return for lab work in: 6-8 weeks FASTING LIPID, LFT  Testing/Procedures: Your physician has requested that you have an echocardiogram. Echocardiography is a painless test that uses sound waves to create images of your heart. It provides your doctor with information about the size and shape of your heart and how well your heart's chambers and valves are working. This procedure takes approximately one hour. There are no restrictions for this procedure. [place] has requested that you have an Treadmill myoview. For further information please visit [facility]iest[facility][email or web address]. Please follow instruction sheet, as given. [facility]  Follow-Up: Your physician recommends that you schedule a follow-up appointment in: 3-6 MONTHS with DR [facility] ONLY  Any Other Special Instructions Will Be Listed Below (If Applicable).  If you need a refill on your cardiac medications before your next appointment, please call your pharmacy.     Hilbert Corrigan, [place]  05/07/2017 2:43 PM    [facility] [address], [place], [place]  [address] Phone: [phone]; Fax: [phone]

## 2017-05-07 NOTE — Patient Instructions (Addendum)
Medication Instructions:  1. RESTART Aspirin 81mg  Take 1 tablet once a day 2. INCREASE Norvasc to 5mg  Take 1 tablet once a day 3. START Lipitor 20mg  Take 1 tablet once a daY  Labwork: Your physician recommends that you return for lab work in: CBC-at next visit with Dr Alen Blew Your physician recommends that you return for lab work in: 6-8 weeks FASTING LIPID, LFT  Testing/Procedures: Your physician has requested that you have an echocardiogram. Echocardiography is a painless test that uses sound waves to create images of your heart. It provides your doctor with information about the size and shape of your heart and how well your heart's chambers and valves are working. This procedure takes approximately one hour. There are no restrictions for this procedure. [place] has requested that you have an Treadmill myoview. For further information please visit [email or web address]. Please follow instruction sheet, as given. [facility]  Follow-Up: Your physician recommends that you schedule a follow-up appointment in: 3-6 MONTHS with DR [facility] ONLY  Any Other Special Instructions Will Be Listed Below (If Applicable).  If you need a refill on your cardiac medications before your next appointment, please call your pharmacy.

## 2017-05-11 ENCOUNTER — Ambulatory Visit (HOSPITAL_COMMUNITY): Payer: Medicare Other | Attending: Cardiology

## 2017-05-11 ENCOUNTER — Other Ambulatory Visit: Payer: Self-pay

## 2017-05-11 DIAGNOSIS — I77810 Thoracic aortic ectasia: Secondary | ICD-10-CM | POA: Diagnosis not present

## 2017-05-11 DIAGNOSIS — E119 Type 2 diabetes mellitus without complications: Secondary | ICD-10-CM | POA: Diagnosis not present

## 2017-05-11 DIAGNOSIS — I1311 Hypertensive heart and chronic kidney disease without heart failure, with stage 5 chronic kidney disease, or end stage renal disease: Secondary | ICD-10-CM | POA: Diagnosis not present

## 2017-05-11 DIAGNOSIS — Z01818 Encounter for other preprocedural examination: Secondary | ICD-10-CM | POA: Insufficient documentation

## 2017-05-11 DIAGNOSIS — E669 Obesity, unspecified: Secondary | ICD-10-CM | POA: Insufficient documentation

## 2017-05-11 DIAGNOSIS — Z6828 Body mass index (BMI) 28.0-28.9, adult: Secondary | ICD-10-CM | POA: Insufficient documentation

## 2017-05-11 DIAGNOSIS — I351 Nonrheumatic aortic (valve) insufficiency: Secondary | ICD-10-CM | POA: Diagnosis not present

## 2017-05-11 DIAGNOSIS — I10 Essential (primary) hypertension: Secondary | ICD-10-CM

## 2017-05-11 DIAGNOSIS — R011 Cardiac murmur, unspecified: Secondary | ICD-10-CM | POA: Insufficient documentation

## 2017-05-11 DIAGNOSIS — R002 Palpitations: Secondary | ICD-10-CM | POA: Insufficient documentation

## 2017-05-11 DIAGNOSIS — Z992 Dependence on renal dialysis: Secondary | ICD-10-CM

## 2017-05-11 DIAGNOSIS — N186 End stage renal disease: Secondary | ICD-10-CM | POA: Diagnosis not present

## 2017-05-11 DIAGNOSIS — Z951 Presence of aortocoronary bypass graft: Secondary | ICD-10-CM | POA: Diagnosis not present

## 2017-05-11 DIAGNOSIS — E1122 Type 2 diabetes mellitus with diabetic chronic kidney disease: Secondary | ICD-10-CM | POA: Insufficient documentation

## 2017-05-11 DIAGNOSIS — I25709 Atherosclerosis of coronary artery bypass graft(s), unspecified, with unspecified angina pectoris: Secondary | ICD-10-CM | POA: Diagnosis not present

## 2017-05-11 DIAGNOSIS — E785 Hyperlipidemia, unspecified: Secondary | ICD-10-CM | POA: Insufficient documentation

## 2017-05-11 DIAGNOSIS — D631 Anemia in chronic kidney disease: Secondary | ICD-10-CM | POA: Insufficient documentation

## 2017-05-11 DIAGNOSIS — I371 Nonrheumatic pulmonary valve insufficiency: Secondary | ICD-10-CM | POA: Insufficient documentation

## 2017-05-11 DIAGNOSIS — Z87891 Personal history of nicotine dependence: Secondary | ICD-10-CM | POA: Diagnosis not present

## 2017-05-11 MED ORDER — PERFLUTREN LIPID MICROSPHERE
1.0000 mL | INTRAVENOUS | Status: AC | PRN
  Administered 2017-05-11: 2 mL via INTRAVENOUS

## 2017-05-14 ENCOUNTER — Other Ambulatory Visit: Payer: Self-pay | Admitting: Oncology

## 2017-05-14 DIAGNOSIS — C61 Malignant neoplasm of prostate: Secondary | ICD-10-CM

## 2017-05-16 ENCOUNTER — Telehealth (HOSPITAL_COMMUNITY): Payer: Self-pay

## 2017-05-16 MED FILL — XTANDI 40 MG CAPSULE: 40 | 30 days supply | Qty: 120 | Fill #0

## 2017-05-16 NOTE — Telephone Encounter (Signed)
Encounter complete. 

## 2017-05-18 ENCOUNTER — Ambulatory Visit (HOSPITAL_COMMUNITY)
Admission: RE | Admit: 2017-05-18 | Discharge: 2017-05-18 | Disposition: A | Discharge status: Home / Self Care | Payer: Medicare Other | Source: Ambulatory Visit | Attending: Cardiology | Admitting: Cardiology

## 2017-05-18 DIAGNOSIS — N186 End stage renal disease: Secondary | ICD-10-CM | POA: Diagnosis not present

## 2017-05-18 DIAGNOSIS — I120 Hypertensive chronic kidney disease with stage 5 chronic kidney disease or end stage renal disease: Secondary | ICD-10-CM | POA: Insufficient documentation

## 2017-05-18 DIAGNOSIS — I10 Essential (primary) hypertension: Secondary | ICD-10-CM

## 2017-05-18 DIAGNOSIS — R011 Cardiac murmur, unspecified: Secondary | ICD-10-CM | POA: Diagnosis not present

## 2017-05-18 DIAGNOSIS — E1122 Type 2 diabetes mellitus with diabetic chronic kidney disease: Secondary | ICD-10-CM | POA: Diagnosis not present

## 2017-05-18 DIAGNOSIS — Z01818 Encounter for other preprocedural examination: Secondary | ICD-10-CM

## 2017-05-18 DIAGNOSIS — R9439 Abnormal result of other cardiovascular function study: Secondary | ICD-10-CM | POA: Diagnosis not present

## 2017-05-18 DIAGNOSIS — I25709 Atherosclerosis of coronary artery bypass graft(s), unspecified, with unspecified angina pectoris: Secondary | ICD-10-CM | POA: Insufficient documentation

## 2017-05-18 DIAGNOSIS — E119 Type 2 diabetes mellitus without complications: Secondary | ICD-10-CM | POA: Diagnosis not present

## 2017-05-18 DIAGNOSIS — Z992 Dependence on renal dialysis: Secondary | ICD-10-CM | POA: Diagnosis not present

## 2017-05-18 DIAGNOSIS — I252 Old myocardial infarction: Secondary | ICD-10-CM | POA: Insufficient documentation

## 2017-05-18 DIAGNOSIS — E785 Hyperlipidemia, unspecified: Secondary | ICD-10-CM | POA: Diagnosis not present

## 2017-05-18 DIAGNOSIS — Z87891 Personal history of nicotine dependence: Secondary | ICD-10-CM | POA: Insufficient documentation

## 2017-05-18 LAB — MYOCARDIAL PERFUSION IMAGING
CHL CUP NUCLEAR SRS: 13
CHL CUP NUCLEAR SSS: 14
LVDIAVOL: 178 mL (ref 62–150)
LVSYSVOL: 89 mL
NUC STRESS TID: 0.96
Peak HR: 85 {beats}/min
Rest HR: 67 {beats}/min
SDS: 1

## 2017-05-18 MED ORDER — TECHNETIUM TC 99M TETROFOSMIN IV KIT
11.0000 | PACK | Freq: Once | INTRAVENOUS | Status: AC | PRN
  Administered 2017-05-18: 11 via INTRAVENOUS
  Filled 2017-05-18: qty 11

## 2017-05-18 MED ORDER — AMINOPHYLLINE 25 MG/ML IV SOLN
75.0000 mg | Freq: Once | INTRAVENOUS | Status: AC
  Administered 2017-05-18: 75 mg via INTRAVENOUS

## 2017-05-18 MED ORDER — REGADENOSON 0.4 MG/5ML IV SOLN
0.4000 mg | Freq: Once | INTRAVENOUS | Status: AC
  Administered 2017-05-18: 0.4 mg via INTRAVENOUS

## 2017-05-18 MED ORDER — TECHNETIUM TC 99M TETROFOSMIN IV KIT
31.6000 | PACK | Freq: Once | INTRAVENOUS | Status: AC | PRN
  Administered 2017-05-18: 31.6 via INTRAVENOUS
  Filled 2017-05-18: qty 32

## 2017-05-21 ENCOUNTER — Encounter: Payer: Self-pay | Admitting: Physician Assistant

## 2017-05-23 ENCOUNTER — Inpatient Hospital Stay: Payer: Medicare Other

## 2017-05-23 ENCOUNTER — Telehealth: Payer: Self-pay | Admitting: Oncology

## 2017-05-23 ENCOUNTER — Inpatient Hospital Stay: Payer: Medicare Other | Attending: Oncology | Admitting: Oncology

## 2017-05-23 VITALS — BP 177/59 | HR 72 | Temp 97.7°F | Resp 18 | Ht 72.0 in | Wt 207.2 lb

## 2017-05-23 DIAGNOSIS — C7951 Secondary malignant neoplasm of bone: Secondary | ICD-10-CM | POA: Insufficient documentation

## 2017-05-23 DIAGNOSIS — C61 Malignant neoplasm of prostate: Secondary | ICD-10-CM

## 2017-05-23 DIAGNOSIS — E291 Testicular hypofunction: Secondary | ICD-10-CM | POA: Diagnosis not present

## 2017-05-23 DIAGNOSIS — N19 Unspecified kidney failure: Secondary | ICD-10-CM | POA: Diagnosis not present

## 2017-05-23 DIAGNOSIS — Z992 Dependence on renal dialysis: Secondary | ICD-10-CM | POA: Insufficient documentation

## 2017-05-23 LAB — CBC WITH DIFFERENTIAL (CANCER CENTER ONLY)
BASOS ABS: 0.1 10*3/uL (ref 0.0–0.1)
BASOS PCT: 1 %
EOS ABS: 0.2 10*3/uL (ref 0.0–0.5)
Eosinophils Relative: 3 %
HEMATOCRIT: 33.5 % — AB (ref 38.4–49.9)
HEMOGLOBIN: 11.3 g/dL — AB (ref 13.0–17.1)
Lymphocytes Relative: 20 %
Lymphs Abs: 1.4 10*3/uL (ref 0.9–3.3)
MCH: 30.8 pg (ref 27.2–33.4)
MCHC: 33.6 g/dL (ref 32.0–36.0)
MCV: 91.7 fL (ref 79.3–98.0)
Monocytes Absolute: 0.6 10*3/uL (ref 0.1–0.9)
Monocytes Relative: 9 %
NEUTROS ABS: 4.8 10*3/uL (ref 1.5–6.5)
Neutrophils Relative %: 67 %
Platelet Count: 291 10*3/uL (ref 140–400)
RBC: 3.66 MIL/uL — AB (ref 4.20–5.82)
RDW: 15.4 % — ABNORMAL HIGH (ref 11.0–14.6)
WBC: 7.1 10*3/uL (ref 4.0–10.3)

## 2017-05-23 LAB — CMP (CANCER CENTER ONLY)
ALK PHOS: 80 U/L (ref 40–150)
ALT: 12 U/L (ref 0–55)
AST: 22 U/L (ref 5–34)
Albumin: 3.6 g/dL (ref 3.5–5.0)
Anion gap: 11 (ref 3–11)
BILIRUBIN TOTAL: 0.7 mg/dL (ref 0.2–1.2)
BUN: 40 mg/dL — AB (ref 7–26)
CALCIUM: 10.4 mg/dL (ref 8.4–10.4)
CHLORIDE: 100 mmol/L (ref 98–109)
CO2: 30 mmol/L — ABNORMAL HIGH (ref 22–29)
CREATININE: 6.44 mg/dL — AB (ref 0.70–1.30)
GFR, EST AFRICAN AMERICAN: 8 mL/min — AB (ref >60)
GFR, EST NON AFRICAN AMERICAN: 7 mL/min — AB (ref >60)
Glucose, Bld: 87 mg/dL (ref 70–140)
Potassium: 4.7 mmol/L (ref 3.5–5.1)
Sodium: 141 mmol/L (ref 136–145)
TOTAL PROTEIN: 8 g/dL (ref 6.4–8.3)

## 2017-05-23 NOTE — Telephone Encounter (Signed)
Scheduled appt per 5/15 los - Gave patient AVS and calender per los.  

## 2017-05-23 NOTE — Progress Notes (Signed)
Hematology and Oncology Follow Up Visit  [name] [name] [id number] [birth year] 84 y.o. 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  He he was diagnosed in 2017 with PSA of 369 and a prostate biopsy showed a Gleason score 4+4 = 8.     Prior Therapy: Androgen deprivation therapy and he remains on it under the care of Dr. Diona Fanti. His PSA decreased to 31 in 2017. His PSA in November 2017 was 41 and in March 2018 was 29.    Current therapy: Xtandi 160 mg daily started in March 2019.  Interim History: [name] [name] resents today for a follow-up visit.  Since the last visit, he started Xtandi at 160 mg daily without complications.  He has been taking this medication on a regular basis for almost 2 months without any complications noted.  He denies any excessive fatigue or tiredness, edema or hematuria.  He does report improvement in his overall energy and performance status.  He continues to receive hemodialysis 3 times a week without complications.  He does not report any headaches, blurry vision, syncope or seizures. He denied any fevers, chills, sweats or weight loss. He does not report any chest pain, palpitation, orthopnea or leg edema. He does not report any cough, wheezing or hemoptysis. He does not report any nausea, vomiting or abdominal pain. He does not report any medic easy or melena.  He does not report any ecchymosis or rash. He denies any arthralgias or myalgias.  He denies any lymphadenopathy or petechiae.  Does not report any anxiety or depression.  Remaining review of system is negative.    Medications: I have reviewed the patient's current medications.  Current Outpatient Medications  Medication Sig Dispense Refill  . amLODipine (NORVASC) 5 MG tablet Take 1 tablet (5 mg total) by mouth daily. 90 tablet 3  . aspirin EC 81 MG tablet Take 1 tablet (81 mg  total) by mouth daily. 90 tablet 3  . atorvastatin (LIPITOR) 20 MG tablet Take 1 tablet (20 mg total) by mouth daily. 90 tablet 3  . calcium acetate (PHOSLO) 667 MG capsule Take 1-2 capsules (667-1,334 mg total) by mouth See admin instructions. Take 2 capsules (1334 mg) with meals and 1 capsule (667 mg) with snacks or a sandwich (Patient taking differently: Take 2,001 mg by mouth 3 (three) times daily with meals. ) 180 capsule 1  . cinacalcet (SENSIPAR) 30 MG tablet Take 30 mg by mouth at bedtime.     Marland Kitchen latanoprost (XALATAN) 0.005 % ophthalmic solution Place 1 drop into both eyes at bedtime.    Marland Kitchen leuprolide (LUPRON) 30 MG injection Inject 30 mg into the muscle every 4 (four) months.    . Multiple Vitamin (DAILY VITE PO) Take 1 tablet by mouth daily.    . Polyethylene Glycol 400 (BLINK TEARS OP) Place 1 drop into both eyes daily as needed (dry eyes).    Gillermina Phy 40 MG capsule TAKE 4 CAPSULES (160 MG TOTAL) BY MOUTH DAILY. 120 capsule 0   No current facility-administered medications for this visit.      Allergies: No Known Allergies  Past Medical History, Surgical history, Social history, and Family History were reviewed and updated.  Physical Exam: Blood pressure (!) 177/59, pulse 72, temperature 97.7 F (36.5 C), temperature source Oral, resp. rate 18, height 6' (1.829 m), weight 207 lb 3.2 oz (94 kg), SpO2 100 %.   ECOG:  1 General appearance: Alert, awake gentleman without distress. Head: Atraumatic without abnormalities. Eyes: Pupils are equal and round reactive to light. Oropharynx: Week's membranes are moist and pink. Lymph nodes: No lymphadenopathy noted in the cervical, supraclavicular, and axillary nodes  Heart: Regular rate without any murmurs or gallops. Lung: Clear without any rhonchi, wheezes or dullness to percussion. Abdomin: Soft, nontender without any rebound or guarding. Musculoskeletal: No joint deformity or effusion. Skin: No ecchymosis or petechiae.   Lab  Results: Lab Results  Component Value Date   WBC 7.1 05/23/2017   HGB 11.3 (L) 05/23/2017   HCT 33.5 (L) 05/23/2017   MCV 91.7 05/23/2017   PLT 291 05/23/2017     Chemistry      Component Value Date/Time   NA 140 04/11/2017 0940   NA 143 09/29/2016 0810   K 5.4 (H) 04/11/2017 0940   K 4.3 09/29/2016 0810   CL 99 (L) 04/11/2017 0940   CO2 29 04/11/2017 0940   CO2 31 (H) 09/29/2016 0810   BUN 26 (H) 04/11/2017 0940   BUN 27.9 (H) 09/29/2016 0810   CREATININE 8.11 (H) 04/11/2017 0940   CREATININE 7.4 (HH) 09/29/2016 0810      Component Value Date/Time   CALCIUM 10.4 (H) 04/11/2017 0940   CALCIUM 9.9 09/29/2016 0810   ALKPHOS 78 03/28/2017 0830   ALKPHOS 141 09/29/2016 0810   AST 15 03/28/2017 0830   AST 21 09/29/2016 0810   ALT 8 03/28/2017 0830   ALT 10 09/29/2016 0810   BILITOT 0.5 03/28/2017 0830   BILITOT 0.48 09/29/2016 0810      Results for [name] [name] (MRN [id number]) as of 05/23/2017 10:23  Ref. Range 07/03/2016 08:42 09/29/2016 08:10 03/28/2017 08:32  Prostate Specific Ag, Serum Latest Ref Range: 0.0 - 4.0 ng/mL 23.5 (H) 21.8 (H) 63.5 (H)      Impression and Plan:   85 year old gentleman with the following issues:  1.  Castration-resistant  prostate cancer with disease to the bone.  He was initially diagnosed in December 2016 with PSA of 369.  He developed castration resistant disease in March 2019 with a PSA up to 6-3.5.  He was started on Xtandi at 160 mg daily in March 9381 without complications.  He reports excellent tolerance to this medication and improvement in his performance status and energy level.  His PSA is currently pending from today but I see no need for any dose reduction or delay. We will continue to monitor his PSA periodically at this time.  2. Androgen deprivation therapy: He continues to receive Lupron under the care of Dr. Diona Fanti.  I recommended continuing this indefinitely.  3. Bone directed therapy: I recommended continuing  Xgeva at this time given his bone disease.  He is currently receiving Xgeva under the care of Dr. Diona Fanti.  Long-term complication associated with this medication were reviewed again.  4.  Renal failure: He is on hemodialysis without any complications at this time.  5.  Prognosis and goals of care: He has an incurable malignancy but the goal is to palliate his disease at this time.  He is enjoying excellent quality of life and performance status and aggressive therapy is warranted.  6. Follow-up: In 2 months to follow his progress.  25  minutes was spent with the patient face-to-face today.  More than 50% of time was dedicated to patient counseling, education and answering questions regarding his diagnosis, prognosis and future plan of care.   Zola Button, MD 5/15/201910:35 AM

## 2017-05-24 LAB — PROSTATE-SPECIFIC AG, SERUM (LABCORP): PROSTATE SPECIFIC AG, SERUM: 7.7 ng/mL — AB (ref 0.0–4.0)

## 2017-05-30 ENCOUNTER — Telehealth: Payer: Self-pay | Admitting: Pharmacy Technician

## 2017-05-30 NOTE — Telephone Encounter (Signed)
Oral Oncology Patient Advocate Encounter  Was successful in securing patient an additional $ 6500 grant from [employer] (PAF) to provide copayment coverage for his Xtandi.  This will keep the out of pocket expense at $0.    I have left a message for the patient.    The billing information is as follows and has been shared with [employer].   Member ID: [id number] Group ID: [id number] RxBin: [id number] Dates of Eligibility: 04/27/2017 through 05/29/2018  [name] [name]. [name] [name], [place] [facility] [place] [phone] 05/30/2017 9:56 AM

## 2017-06-08 ENCOUNTER — Other Ambulatory Visit: Payer: Self-pay | Admitting: Oncology

## 2017-06-08 DIAGNOSIS — C61 Malignant neoplasm of prostate: Secondary | ICD-10-CM

## 2017-06-13 MED FILL — XTANDI 40 MG CAPSULE: 40 | 30 days supply | Qty: 120 | Fill #0

## 2017-07-02 ENCOUNTER — Ambulatory Visit: Payer: Self-pay | Admitting: Surgery

## 2017-07-02 NOTE — H&P (Signed)
General Surgery [facility]  [name] DOB: 12-26-1930 Married / Language: English / Race: Black or African American Male   History of Present Illness  The patient is a 84 year old male who presents with secondary hyperparathyroidism.  CC: secondary hyperparathyroidism, ESRD  Patient is referred by Dr. Donato Heinz for surgical evaluation and management of secondary hyperparathyroidism due to end-stage renal disease. Patient has end-stage renal disease secondary to hypertension and type 2 diabetes. He started dialysis in January 2011. He currently dialyzes on Tuesdays Thursdays and Saturdays at the [facility]. He has a fistula in the right upper extremity. Patient has had problems with elevated intact PTH level. Also elevated phosphorus levels. Recent PTH levels have jumped over 5000. He has been on and off of Sensipar in the past. He is currently taking oral Sensipar. Patient has not developed any complications of hyperparathyroidism. He presents today to discuss total parathyroidectomy with autotransplantation at the recommendation of his nephrologist. Patient does have a history of coronary artery disease and underwent coronary artery bypass grafting in December 2014. Patient has had no head or neck surgery.   Diagnostic Studies History  Colonoscopy  1-5 years ago  Allergies No Known Drug Allergies [03/19/2017]: Allergies Reconciled   Medication History Amlodipine Besy-Benazepril HCl (2.5-10MG  Capsule, Oral) Active. Cinacalcet HCl (30MG  Tablet, Oral) Active. Medications Reconciled  Social History Alcohol use  Remotely quit alcohol use. Tobacco use  Former smoker.  Other Problems High blood pressure   Review of Systems General Present- Night Sweats. Not Present- Appetite Loss, Chills, Fatigue, Fever, Weight Gain and Weight Loss. Skin Not Present- Change in Wart/Mole, Dryness, Hives, Jaundice, New Lesions,  Non-Healing Wounds, Rash and Ulcer. HEENT Not Present- Earache, Hearing Loss, Hoarseness, Nose Bleed, Oral Ulcers, Ringing in the Ears, Seasonal Allergies, Sinus Pain, Sore Throat, Visual Disturbances, Wears glasses/contact lenses and Yellow Eyes. Respiratory Not Present- Bloody sputum, Chronic Cough, Difficulty Breathing, Snoring and Wheezing. Breast Not Present- Breast Mass, Breast Pain, Nipple Discharge and Skin Changes. Cardiovascular Not Present- Chest Pain, Difficulty Breathing Lying Down, Leg Cramps, Palpitations, Rapid Heart Rate, Shortness of Breath and Swelling of Extremities. Gastrointestinal Present- Constipation. Not Present- Abdominal Pain, Bloating, Bloody Stool, Change in Bowel Habits, Chronic diarrhea, Difficulty Swallowing, Excessive gas, Gets full quickly at meals, Hemorrhoids, Indigestion, Nausea, Rectal Pain and Vomiting. Male Genitourinary Not Present- Blood in Urine, Change in Urinary Stream, Frequency, Impotence, Nocturia, Painful Urination, Urgency and Urine Leakage. Musculoskeletal Not Present- Back Pain, Joint Pain, Joint Stiffness, Muscle Pain, Muscle Weakness and Swelling of Extremities. Neurological Not Present- Decreased Memory, Fainting, Headaches, Numbness, Seizures, Tingling, Tremor, Trouble walking and Weakness.  Vitals Weight: 210 lb Height: 72in Body Surface Area: 2.18 m Body Mass Index: 28.48 kg/m  Temp.: 98.59F  Pulse: 82 (Regular)  BP: 136/72 (Sitting, Left Arm, Standard)  Physical Exam   See vital signs recorded above  GENERAL APPEARANCE Development: normal Nutritional status: normal Gross deformities: none  SKIN Rash, lesions, ulcers: none Induration, erythema: none Nodules: none palpable  EYES Conjunctiva and lids: normal Pupils: equal and reactive Iris: normal bilaterally  EARS, NOSE, MOUTH, THROAT External ears: no lesion or deformity External nose: no lesion or deformity Hearing: grossly normal Lips: no lesion or  deformity Dentition: normal for age Oral mucosa: moist  NECK Symmetric: yes Trachea: midline Thyroid: no palpable nodules in the thyroid bed  CHEST Respiratory effort: normal Retraction or accessory muscle use: no Breath sounds: normal bilaterally Rales, rhonchi, wheeze: none  CARDIOVASCULAR Auscultation: regular  rhythm, normal rate Murmurs: none Pulses: carotid and radial pulse 2+ palpable Lower extremity edema: none Lower extremity varicosities: none  MUSCULOSKELETAL Station and gait: normal Digits and nails: no clubbing or cyanosis Muscle strength: grossly normal all extremities Range of motion: grossly normal all extremities; mild limitation in extension of the neck Deformity: none Arteriovenous fistula right upper extremity  LYMPHATIC Cervical: none palpable Supraclavicular: none palpable  PSYCHIATRIC Oriented to person, place, and time: yes Mood and affect: normal for situation Judgment and insight: appropriate for situation    Assessment & Plan   SECONDARY HYPERPARATHYROIDISM, RENAL (N25.81)  Patient presents on referral from his nephrologist for evaluation for total parathyroidectomy with autotransplantation to the forearm for management of secondary hyperparathyroidism due to end-stage renal disease.  The patient and I discussed a surgical operation. We discussed the location of the incisions. We discussed the hospital stay to be anticipated. We discussed potential complications including injury to recurrent laryngeal nerves and the possibility of not being able to identify all 4 parathyroid glands. He understands and wishes to proceed in the near future.  The risks and benefits of the procedure have been discussed at length with the patient. The patient understands the proposed procedure, potential alternative treatments, and the course of recovery to be expected. All of the patient's questions have been answered at this time. The patient wishes to  proceed with surgery.  Armandina Gemma, [facility] Office: [phone]

## 2017-07-04 ENCOUNTER — Other Ambulatory Visit: Payer: Self-pay | Admitting: Oncology

## 2017-07-04 DIAGNOSIS — C61 Malignant neoplasm of prostate: Secondary | ICD-10-CM

## 2017-07-05 NOTE — Pre-Procedure Instructions (Addendum)
[name] [name]  07/05/2017      [facility] - [place], [place] - [address] [place] [address] Phone: [phone] Fax: [phone]  [place], [place] - [address] [place] [address] Phone: [phone] Fax: [phone]    Your procedure is scheduled on July 1st at 9:30 A.M.-12 P.M.  Report to [facility] Admitting at 7:30 A.M.  Call this number if you have problems the morning of surgery:  [phone]   Remember:  Do not eat or drink after midnight.    Continue all medications as directed by your physician except follow these medication instructions before surgery below    Take these medicines the morning of surgery with A SIP OF WATER   amLODipine (NORVASC) 5 MG tablet    Follow your doctors instructions regarding your Aspirin.  If no instructions were given by your doctor, then you will need to call the prescribing office  office to get instructions.     7 days prior to surgery STOP taking any Aspirin(unless otherwise instructed by your surgeon), Aleve, Naproxen, Ibuprofen, Motrin, Advil, Goody's, BC's,  all herbal medications, fish oil, and all vitamins   WHAT DO I DO ABOUT MY DIABETES MEDICATION?   Marland Kitchen Do not take oral diabetes medicines (pills) the morning of surgery.  . The day of surgery, do not take other diabetes injectables, including Byetta (exenatide), Bydureon (exenatide ER), Victoza (liraglutide), or Trulicity (dulaglutide).   How to Manage Your Diabetes Before and After Surgery  Why is it important to control my blood sugar before and after surgery? . Improving blood sugar levels before and after surgery helps healing and can limit problems. . A way of improving blood sugar control is eating a healthy diet by: o  Eating less sugar and carbohydrates o  Increasing activity/exercise o  Talking with your doctor about  reaching your blood sugar goals . High blood sugars (greater than 180 mg/dL) can raise your risk of infections and slow your recovery, so you will need to focus on controlling your diabetes during the weeks before surgery. . Make sure that the doctor who takes care of your diabetes knows about your planned surgery including the date and location.  How do I manage my blood sugar before surgery? . Check your blood sugar at least 4 times a day, starting 2 days before surgery, to make sure that the level is not too high or low. o Check your blood sugar the morning of your surgery when you wake up and every 2 hours until you get to the Short Stay unit. . If your blood sugar is less than 70 mg/dL, you will need to treat for low blood sugar: o Do not take insulin. o Treat a low blood sugar (less than 70 mg/dL) with  cup of clear juice (cranberry or apple), 4 glucose tablets, OR glucose gel. o Recheck blood sugar in 15 minutes after treatment (to make sure it is greater than 70 mg/dL). If your blood sugar is not greater than 70 mg/dL on recheck, call [phone] for further instructions. . Report your blood sugar to the short stay nurse when you get to Short Stay.  . If you are admitted to the hospital after surgery: o Your blood sugar will be checked by the staff and you will probably be given insulin after surgery (instead of oral diabetes medicines) to make sure  you have good blood sugar levels. o The goal for blood sugar control after surgery is 80-180 mg/dL.     Do not wear jewelry.  Do not wear lotions, powders, or colognes, or deodorant.  Do not shave 48 hours prior to surgery.  Do not bring valuables to the hospital.  [facility] is not responsible for any belongings or valuables.  Hearing aids, Eyeglasses, contacts, dentures or bridgework may not be worn into surgery.  Leave your suitcase in the car.  After surgery it may be brought to your room.  For patients admitted to the hospital,  discharge time will be determined by your treatment team.  Patients discharged the day of surgery will not be allowed to drive home.    [facility]- Preparing For Surgery  Before surgery, you can play an important role. Because skin is not sterile, your skin needs to be as free of germs as possible. You can reduce the number of germs on your skin by washing with CHG (chlorahexidine gluconate) Soap before surgery.  CHG is an antiseptic cleaner which kills germs and bonds with the skin to continue killing germs even after washing.    Oral Hygiene is also important to reduce your risk of infection.  Remember - BRUSH YOUR TEETH THE MORNING OF SURGERY WITH YOUR REGULAR TOOTHPASTE  Please do not use if you have an allergy to CHG or antibacterial soaps. If your skin becomes reddened/irritated stop using the CHG.  Do not shave (including legs and underarms) for at least 48 hours prior to first CHG shower. It is OK to shave your face.  Please follow these instructions carefully.   1. Shower the NIGHT BEFORE SURGERY and the MORNING OF SURGERY with CHG.   2. If you chose to wash your hair, wash your hair first as usual with your normal shampoo.  3. After you shampoo, rinse your hair and body thoroughly to remove the shampoo.  4. Use CHG as you would any other liquid soap. You can apply CHG directly to the skin and wash gently with a scrungie or a clean washcloth.   5. Apply the CHG Soap to your body ONLY FROM THE NECK DOWN.  Do not use on open wounds or open sores. Avoid contact with your eyes, ears, mouth and genitals (private parts). Wash Face and genitals (private parts)  with your normal soap.  6. Wash thoroughly, paying special attention to the area where your surgery will be performed.  7. Thoroughly rinse your body with warm water from the neck down.  8. DO NOT shower/wash with your normal soap after using and rinsing off the CHG Soap.  9. Pat yourself dry with a CLEAN TOWEL.  10. Wear  CLEAN PAJAMAS to bed the night before surgery, wear comfortable clothes the morning of surgery  11. Place CLEAN SHEETS on your bed the night of your first shower and DO NOT SLEEP WITH PETS.    Day of Surgery:  Do not apply any deodorants/lotions.  Please wear clean clothes to the hospital/surgery center.   Remember to brush your teeth WITH YOUR REGULAR TOOTHPASTE.  Please read over the following fact sheets that you were given. Coughing and Deep Breathing and Surgical Site Infection Prevention

## 2017-07-06 ENCOUNTER — Encounter (HOSPITAL_COMMUNITY): Payer: Self-pay | Admitting: Vascular Surgery

## 2017-07-06 ENCOUNTER — Encounter (HOSPITAL_COMMUNITY)
Admission: RE | Admit: 2017-07-06 | Discharge: 2017-07-06 | Disposition: A | Discharge status: Home / Self Care | Payer: Medicare Other | Source: Ambulatory Visit | Attending: Surgery | Admitting: Surgery

## 2017-07-06 ENCOUNTER — Other Ambulatory Visit: Payer: Self-pay

## 2017-07-06 ENCOUNTER — Ambulatory Visit (HOSPITAL_COMMUNITY)
Admission: RE | Admit: 2017-07-06 | Discharge: 2017-07-06 | Disposition: A | Discharge status: Home / Self Care | Payer: Medicare Other | Source: Ambulatory Visit | Attending: Anesthesiology | Admitting: Anesthesiology

## 2017-07-06 ENCOUNTER — Encounter (HOSPITAL_COMMUNITY): Payer: Self-pay

## 2017-07-06 DIAGNOSIS — Z01818 Encounter for other preprocedural examination: Secondary | ICD-10-CM | POA: Diagnosis not present

## 2017-07-06 DIAGNOSIS — I517 Cardiomegaly: Secondary | ICD-10-CM | POA: Diagnosis not present

## 2017-07-06 DIAGNOSIS — Z951 Presence of aortocoronary bypass graft: Secondary | ICD-10-CM | POA: Insufficient documentation

## 2017-07-06 HISTORY — DX: Atherosclerotic heart disease of native coronary artery without angina pectoris: I25.10

## 2017-07-06 LAB — BASIC METABOLIC PANEL
Anion gap: 10 (ref 5–15)
BUN: 27 mg/dL — ABNORMAL HIGH (ref 8–23)
CHLORIDE: 100 mmol/L (ref 98–111)
CO2: 30 mmol/L (ref 22–32)
Calcium: 9.8 mg/dL (ref 8.9–10.3)
Creatinine, Ser: 4.75 mg/dL — ABNORMAL HIGH (ref 0.61–1.24)
GFR calc Af Amer: 12 mL/min — ABNORMAL LOW (ref >60)
GFR calc non Af Amer: 10 mL/min — ABNORMAL LOW (ref >60)
GLUCOSE: 123 mg/dL — AB (ref 70–99)
POTASSIUM: 4.2 mmol/L (ref 3.5–5.1)
Sodium: 140 mmol/L (ref 135–145)

## 2017-07-06 LAB — CBC
HEMATOCRIT: 19.5 % — AB (ref 39.0–52.0)
Hemoglobin: 5.9 g/dL — CL (ref 13.0–17.0)
MCH: 32.1 pg (ref 26.0–34.0)
MCHC: 30.3 g/dL (ref 30.0–36.0)
MCV: 106 fL — AB (ref 78.0–100.0)
Platelets: 310 10*3/uL (ref 150–400)
RBC: 1.84 MIL/uL — ABNORMAL LOW (ref 4.22–5.81)
RDW: 17.2 % — AB (ref 11.5–15.5)
WBC: 6.6 10*3/uL (ref 4.0–10.5)

## 2017-07-06 LAB — GLUCOSE, CAPILLARY: Glucose-Capillary: 120 mg/dL — ABNORMAL HIGH (ref 70–99)

## 2017-07-06 LAB — HEMOGLOBIN A1C
Hgb A1c MFr Bld: 4 % — ABNORMAL LOW (ref 4.8–5.6)
Mean Plasma Glucose: 68.1 mg/dL

## 2017-07-06 NOTE — Progress Notes (Addendum)
PCP - Dr. Claris Gower Cardiologist - Dr. Percival Spanish Oncologist-Dr. Zola Button  Chest x-ray - 07/06/2017 EKG - 04/11/2017 Stress Test - 05/18/17 ECHO - 05/11/2017 Cardiac Cath - 12/30/2012 Cardiac clearance by Almyra Deforest, PA. Note in letters tab in chart review.   Sleep Study - denies  Fasting Blood Sugar - usually between 100-110 CBG at PAT appointment 120 Checks Blood Sugar-only in dialysis  Aspirin Instructions:Pt received no instructions. Spoke with nurse at Dr. Tera Helper office. Will find out and call patient directly at home with instructions.   BP on arrival to PAT appointment 144/34. Pt took his amlodipine this morning and also had a HD treatment last evening. Pt reported a small bleed out from his fistula site once returning home from dialysis that was stopped by pressure and a few paper towels. Rechecked BP at end of appointment, 158/38. Pt denies any symptoms of dizziness or lightheadedness.   Anesthesia review: Yes  Patient denies shortness of breath, fever, cough and chest pain at PAT appointment  Patient verbalized understanding of instructions that were given to them at the PAT appointment. Patient was also instructed that they will need to review over the PAT instructions again at home before surgery.  Jacqlyn Larsen, RN

## 2017-07-06 NOTE — Progress Notes (Addendum)
Anesthesia Chart Review:   Case:  [id number] Date/Time:  07/09/17 0915   Procedure:  TOTAL PARATHYROIDECTOMY WITH AUTOTRANSPLANT TO LEFT FOREARM (N/A )   Anesthesia type:  General   Pre-op diagnosis:  SECONDARY HYPERPARATHYROIDISM OF RENAL ORIGIN   Location:  [facility] OR ROOM 02 / [facility] OR   Surgeon:  Armandina Gemma, MD      DISCUSSION: Patient is an 84 year old male scheduled for total parathyroidectomy with autotransplant to left forearm on 07/09/17 by Dr. Armandina Gemma. Dx: Secondary hyperparathyroidism of renal origin. Case was initially scheduled for 04/16/17, but was postponed to allow for cardiology evaluation due to overdue CAD follow-up (last visit had been 03/02/14) with worsening EKG changes. Since then patient was seen by Almyra Deforest, PA-C and "cleared to proceed with parathyroidectomy" following recent stress and echo (see below).   History includes CAD/NSTEMI 01/2009 & 12/2012 (s/p CABG: LIMA-LAD, SVG-INT, SVG-OM, SVG-PDA) 12/31/12, ESRD (TTS [facility]; right brachiocephalic AVF 14/48/18), advanced prostate cancer (diagnosed 12/2014), former smoker (quit '97), hypertension, dyslipidemia, anemia of chronic disease, diabetes mellitus type 2 (diet controlled), gout.  Patient had PAT visit this morning. After patient had left his appointment, lab notified nursing staff of critical HGB of 5.9. H/H trends have dropped significantly since 04/11/17 (14.3/44.7 04/11/17 --> 11.3/33.5 05/23/17 --> 5.9/19.5 07/06/17). PAT RN Lilia Pro notified nurse Sunday Spillers at Dr. Gala Lewandowsky office who contacted Dr. Harlow Asa for recommendations. She reported that he is aware of lab results and that patient had not held ASA. He contacted nephrologist Dr. Roney Jaffe. A joint decision was made to cancel surgery for 07/09/17. At this point they are not planning to send patient to the ED, but nephrology will follow-up with him at his hemodialysis session tomorrow and will initiate further anemia work-up. Dr. Harlow Asa will have his staff contact the patient  and OR scheduling.   VS: BP (!) 158/38 Comment: notified Art gallery manager  Pulse 77   Temp 36.6 C   Resp 20   Ht 6' (1.829 m)   Wt 205 lb (93 kg)   SpO2 100%   BMI 27.80 kg/m   PROVIDERS: - PCP is Dr. Claris Gower.  - Nephrologist is Dr. Erling Cruz (although sees different providers at hemodialysis center).  - HEM-ONC  is Dr. Zola Button, last visit 05/23/17. No new changes in his prostate cancer treatment regimen made and 2 month follow-up recommended. - Cardiologist is Dr. Minus Breeding. Last visit with Almyra Deforest, PA on 05/07/17. He was restarted on ASA and low dose Lipitor and would consider restarting b-blocker at follow-up visits. Since details of stopping ASA were not completely clear, he did recommend a repeat CBC at his next oncology visit to make sure hemoglobin stable back on ASA. Repeat H/H on 05/23/08 was 11.3/33.5, down from 14.3/44.7.  - Urologist is Dr. Franchot Gallo.    LABS: Glucose 123. K 4.2. A1c 4.0. PLT 310, H/H 5.9/19.5, down from 11.3/33.5 on 05/23/17. (all labs ordered are listed, but only abnormal results are displayed)  Labs Reviewed  HEMOGLOBIN A1C - Abnormal; Notable for the following components:      Result Value   Hgb A1c MFr Bld 4.0 (*)    All other components within normal limits  BASIC METABOLIC PANEL - Abnormal; Notable for the following components:   Glucose, Bld 123 (*)    BUN 27 (*)    Creatinine, Ser 4.75 (*)    GFR calc non Af Amer 10 (*)    GFR calc Af Amer 12 (*)  All other components within normal limits  CBC - Abnormal; Notable for the following components:   RBC 1.84 (*)    Hemoglobin 5.9 (*)    HCT 19.5 (*)    MCV 106.0 (*)    RDW 17.2 (*)    All other components within normal limits    IMAGES:  CXR 07/06/17:  IMPRESSION: 1.  Prior CABG.  Stable cardiomegaly. 2.  Low lung volumes with mild basilar atelectasis.   EKG: EKG 04/11/17: NSR, LAFB, minimal voltage criteria for LVH, may be normal variant. Cannot rule out inferior  infarct (masked by bifascicular block?), age undetermined. T wave abnormality, consider anterolateral ischemia. T wave abnormalities not as apparent on 06/11/14 EKG, but negative T waves in V3-6 were present on 03/02/14 tracing and in inferior leads to a slightly lesser extent.   CV: Nuclear stress test 05/18/17:  The left ventricular ejection fraction is mildly decreased (45-54%).  Nuclear stress EF: 50%.  There was no ST segment deviation noted during stress.  Defect 1: There is a small defect of severe severity present in the basal inferior and mid inferior location.  Findings consistent with prior myocardial infarction.  This is an intermediate risk study. There is a small size, irreversible defect in the basal and mid inferior walls consistent with prior infarction, no ischemia is seen. LVEF is mildly decreased at 50%.   Echo 05/11/17: Study Conclusions - Procedure narrative: Transthoracic echocardiography. Image   quality was suboptimal. The study was technically difficult.   Intravenous contrast (Definity) was administered. - Left ventricle: The cavity size was mildly dilated. Systolic   function was normal. The estimated ejection fraction was in the   range of 55% to 60%. Wall motion was normal; there were no   regional wall motion abnormalities. There was an increased   relative contribution of atrial contraction to ventricular   filling. Doppler parameters are consistent with abnormal left   ventricular relaxation (grade 1 diastolic dysfunction). - Aortic valve: Severe diffuse thickening and calcification   involving the noncoronary cusp. Noncoronary cusp immobility was   noted. There was mild regurgitation. Regurgitation pressure   half-time: 452 ms. - Aorta: Ascending aortic diameter: 40 mm (S). - Ascending aorta: The ascending aorta was mildly dilated. - Mitral valve: Severely calcified annulus. There was trivial   regurgitation. - Left atrium: The atrium was mildly  dilated. - Right ventricle: The cavity size was moderately dilated. Wall   thickness was normal. - Pulmonic valve: There was mild regurgitation. - Pulmonary arteries: PA peak pressure: 36 mm Hg (S).  Last cardiac cath was 12/30/12 prior to CABG and showed 3V CAD and mild segmental LV systolic dysfunction, EF 00%, inferior wall hypokinesis.  Carotid U/S 12/30/12: Summary: - The vertebral arteries appear patent with antegrade flow. - Findings consistent with less than 39 percent stenosis involving the right internal carotid artery and the left internal carotid artery.   Past Medical History:  Diagnosis Date  . Anemia of chronic disease   . Chest pain   . Coronary artery disease   . Dialysis patient ([place])    T, TH, Sat  . Dyslipidemia   . ESRD (end stage renal disease) on dialysis [facility])    "[employer]; T, Thurs, Sat" (12/26/2012)  . Gout   . Heart murmur   . Hypertension   . Metastatic cancer to bone ([place]) dx'd 04/04/17  . MYOCARDIAL INFARCTION, ACUTE, NON-Q WAVE 04/26/2009   Qualifier: Diagnosis of  By: Percival Spanish, MD, Farrel Gordon    .  Obesity   . Prostate CA ([place]) dx'd 12/2014   initial dx  . Prostate cancer ([place]) dx'd 04/04/17   recurrence  . Shortness of breath    "comes up at anytime" (12/26/2012)  . Type II diabetes mellitus ([place])    "diet controlled" (12/26/2012)  . Volume overload     Past Surgical History:  Procedure Laterality Date  . ARTERIOVENOUS GRAFT PLACEMENT Right 12/2008   "upper arm"  . AV FISTULA PLACEMENT    . CARDIAC CATHETERIZATION  12/30/2012  . CORONARY ARTERY BYPASS GRAFT N/A 12/31/2012   Procedure: CORONARY ARTERY BYPASS GRAFTING times four using left internal mammary and right saphenous vein;  Surgeon: Grace Isaac, MD;  Location: [facility];  Service: Open Heart Surgery;  Laterality: N/A;  . INTRAOPERATIVE TRANSESOPHAGEAL ECHOCARDIOGRAM N/A 12/31/2012   Procedure: INTRAOPERATIVE TRANSESOPHAGEAL ECHOCARDIOGRAM;  Surgeon: Grace Isaac,  MD;  Location: [facility];  Service: Open Heart Surgery;  Laterality: N/A;  . LEFT HEART CATHETERIZATION WITH CORONARY ANGIOGRAM N/A 12/30/2012   Procedure: LEFT HEART CATHETERIZATION WITH CORONARY ANGIOGRAM;  Surgeon: Burnell Blanks, MD;  Location: [facility] CATH LAB;  Service: Cardiovascular;  Laterality: N/A;    MEDICATIONS: . amLODipine (NORVASC) 5 MG tablet  . aspirin EC 81 MG tablet  . atorvastatin (LIPITOR) 20 MG tablet  . calcium acetate (PHOSLO) 667 MG capsule  . cinacalcet (SENSIPAR) 60 MG tablet  . latanoprost (XALATAN) 0.005 % ophthalmic solution  . leuprolide (LUPRON) 30 MG injection  . Multiple Vitamin (DAILY VITE PO)  . XTANDI 40 MG capsule   No current facility-administered medications for this encounter.     George Hugh [facility] Short Stay Center/Anesthesiology Phone [phone] 07/06/2017 2:35 PM

## 2017-07-06 NOTE — Progress Notes (Signed)
CRITICAL VALUE ALERT  Critical Value:  HGB 5.9  Date & Time Notied:  07/06/2017 0955  Provider Notified: Damaris Schooner with Sunday Spillers at Dr. Tera Helper office, will notify Dr. Tera Helper and notify patient for further instructions.   Orders Received/Actions taken: none at this time.

## 2017-07-07 ENCOUNTER — Encounter (HOSPITAL_COMMUNITY): Payer: Self-pay

## 2017-07-07 ENCOUNTER — Other Ambulatory Visit: Payer: Self-pay

## 2017-07-07 ENCOUNTER — Observation Stay (HOSPITAL_COMMUNITY)
Admission: EM | Admit: 2017-07-07 | Discharge: 2017-07-09 | Disposition: A | Discharge status: Home / Self Care | Payer: Medicare Other | Attending: Internal Medicine | Admitting: Internal Medicine

## 2017-07-07 DIAGNOSIS — D649 Anemia, unspecified: Secondary | ICD-10-CM | POA: Diagnosis present

## 2017-07-07 DIAGNOSIS — Z823 Family history of stroke: Secondary | ICD-10-CM | POA: Diagnosis not present

## 2017-07-07 DIAGNOSIS — R002 Palpitations: Secondary | ICD-10-CM | POA: Insufficient documentation

## 2017-07-07 DIAGNOSIS — I371 Nonrheumatic pulmonary valve insufficiency: Secondary | ICD-10-CM | POA: Insufficient documentation

## 2017-07-07 DIAGNOSIS — I252 Old myocardial infarction: Secondary | ICD-10-CM | POA: Diagnosis not present

## 2017-07-07 DIAGNOSIS — E785 Hyperlipidemia, unspecified: Secondary | ICD-10-CM | POA: Diagnosis not present

## 2017-07-07 DIAGNOSIS — Z6827 Body mass index (BMI) 27.0-27.9, adult: Secondary | ICD-10-CM | POA: Diagnosis not present

## 2017-07-07 DIAGNOSIS — I25810 Atherosclerosis of coronary artery bypass graft(s) without angina pectoris: Secondary | ICD-10-CM | POA: Diagnosis present

## 2017-07-07 DIAGNOSIS — I10 Essential (primary) hypertension: Secondary | ICD-10-CM | POA: Diagnosis present

## 2017-07-07 DIAGNOSIS — N2581 Secondary hyperparathyroidism of renal origin: Secondary | ICD-10-CM | POA: Insufficient documentation

## 2017-07-07 DIAGNOSIS — I120 Hypertensive chronic kidney disease with stage 5 chronic kidney disease or end stage renal disease: Secondary | ICD-10-CM | POA: Insufficient documentation

## 2017-07-07 DIAGNOSIS — R079 Chest pain, unspecified: Secondary | ICD-10-CM | POA: Diagnosis not present

## 2017-07-07 DIAGNOSIS — Z85830 Personal history of malignant neoplasm of bone: Secondary | ICD-10-CM | POA: Diagnosis not present

## 2017-07-07 DIAGNOSIS — M109 Gout, unspecified: Secondary | ICD-10-CM | POA: Diagnosis present

## 2017-07-07 DIAGNOSIS — Z79899 Other long term (current) drug therapy: Secondary | ICD-10-CM | POA: Diagnosis not present

## 2017-07-07 DIAGNOSIS — Z951 Presence of aortocoronary bypass graft: Secondary | ICD-10-CM | POA: Diagnosis not present

## 2017-07-07 DIAGNOSIS — K2950 Unspecified chronic gastritis without bleeding: Secondary | ICD-10-CM | POA: Diagnosis not present

## 2017-07-07 DIAGNOSIS — Z992 Dependence on renal dialysis: Secondary | ICD-10-CM | POA: Diagnosis not present

## 2017-07-07 DIAGNOSIS — Z8546 Personal history of malignant neoplasm of prostate: Secondary | ICD-10-CM | POA: Insufficient documentation

## 2017-07-07 DIAGNOSIS — R011 Cardiac murmur, unspecified: Secondary | ICD-10-CM | POA: Insufficient documentation

## 2017-07-07 DIAGNOSIS — D62 Acute posthemorrhagic anemia: Principal | ICD-10-CM | POA: Insufficient documentation

## 2017-07-07 DIAGNOSIS — Z87891 Personal history of nicotine dependence: Secondary | ICD-10-CM | POA: Diagnosis not present

## 2017-07-07 DIAGNOSIS — I351 Nonrheumatic aortic (valve) insufficiency: Secondary | ICD-10-CM | POA: Insufficient documentation

## 2017-07-07 DIAGNOSIS — K269 Duodenal ulcer, unspecified as acute or chronic, without hemorrhage or perforation: Secondary | ICD-10-CM | POA: Insufficient documentation

## 2017-07-07 DIAGNOSIS — E669 Obesity, unspecified: Secondary | ICD-10-CM | POA: Insufficient documentation

## 2017-07-07 DIAGNOSIS — Z833 Family history of diabetes mellitus: Secondary | ICD-10-CM | POA: Insufficient documentation

## 2017-07-07 DIAGNOSIS — E1122 Type 2 diabetes mellitus with diabetic chronic kidney disease: Secondary | ICD-10-CM | POA: Diagnosis not present

## 2017-07-07 DIAGNOSIS — D631 Anemia in chronic kidney disease: Secondary | ICD-10-CM | POA: Diagnosis present

## 2017-07-07 DIAGNOSIS — N186 End stage renal disease: Secondary | ICD-10-CM | POA: Diagnosis not present

## 2017-07-07 DIAGNOSIS — N189 Chronic kidney disease, unspecified: Secondary | ICD-10-CM

## 2017-07-07 LAB — CBC WITH DIFFERENTIAL/PLATELET
Abs Immature Granulocytes: 0 10*3/uL (ref 0.0–0.1)
Basophils Absolute: 0 10*3/uL (ref 0.0–0.1)
Basophils Relative: 0 %
Eosinophils Absolute: 0.1 10*3/uL (ref 0.0–0.7)
Eosinophils Relative: 2 %
HCT: 19.1 % — ABNORMAL LOW (ref 39.0–52.0)
Hemoglobin: 5.8 g/dL — CL (ref 13.0–17.0)
Immature Granulocytes: 0 %
Lymphocytes Relative: 21 %
Lymphs Abs: 1 10*3/uL (ref 0.7–4.0)
MCH: 32.6 pg (ref 26.0–34.0)
MCHC: 30.4 g/dL (ref 30.0–36.0)
MCV: 107.3 fL — ABNORMAL HIGH (ref 78.0–100.0)
Monocytes Absolute: 0.5 10*3/uL (ref 0.1–1.0)
Monocytes Relative: 10 %
Neutro Abs: 3.2 10*3/uL (ref 1.7–7.7)
Neutrophils Relative %: 67 %
Platelets: 283 10*3/uL (ref 150–400)
RBC: 1.78 MIL/uL — ABNORMAL LOW (ref 4.22–5.81)
RDW: 17.2 % — ABNORMAL HIGH (ref 11.5–15.5)
WBC: 4.8 10*3/uL (ref 4.0–10.5)

## 2017-07-07 LAB — BASIC METABOLIC PANEL
Anion gap: 10 (ref 5–15)
BUN: 9 mg/dL (ref 8–23)
CO2: 30 mmol/L (ref 22–32)
Calcium: 8.8 mg/dL — ABNORMAL LOW (ref 8.9–10.3)
Chloride: 98 mmol/L (ref 98–111)
Creatinine, Ser: 2.49 mg/dL — ABNORMAL HIGH (ref 0.61–1.24)
GFR calc Af Amer: 26 mL/min — ABNORMAL LOW (ref >60)
GFR calc non Af Amer: 22 mL/min — ABNORMAL LOW (ref >60)
Glucose, Bld: 110 mg/dL — ABNORMAL HIGH (ref 70–99)
Potassium: 3.5 mmol/L (ref 3.5–5.1)
Sodium: 138 mmol/L (ref 135–145)

## 2017-07-07 LAB — POC OCCULT BLOOD, ED: FECAL OCCULT BLD: POSITIVE — AB

## 2017-07-07 LAB — PREPARE RBC (CROSSMATCH)

## 2017-07-07 MED ORDER — ADULT MULTIVITAMIN W/MINERALS CH
ORAL_TABLET | Freq: Every day | ORAL | Status: DC
  Administered 2017-07-07 – 2017-07-09 (×3): 1 via ORAL
  Filled 2017-07-07 (×3): qty 1

## 2017-07-07 MED ORDER — ONDANSETRON HCL 4 MG/2ML IJ SOLN: 4.0000 mg | Freq: Four times a day (QID) | INTRAMUSCULAR | Status: DC | PRN

## 2017-07-07 MED ORDER — SODIUM CHLORIDE 0.9% FLUSH
3.0000 mL | INTRAVENOUS | Status: DC | PRN
Start: 2017-07-07 — End: 2017-07-09

## 2017-07-07 MED ORDER — ASPIRIN EC 81 MG PO TBEC
81.0000 mg | DELAYED_RELEASE_TABLET | Freq: Every day | ORAL | Status: DC
  Administered 2017-07-07 – 2017-07-09 (×3): 81 mg via ORAL
  Filled 2017-07-07 (×3): qty 1

## 2017-07-07 MED ORDER — CALCIUM ACETATE (PHOS BINDER) 667 MG PO CAPS
2001.0000 mg | ORAL_CAPSULE | Freq: Three times a day (TID) | ORAL | Status: DC
  Administered 2017-07-07 – 2017-07-09 (×5): 2001 mg via ORAL
  Filled 2017-07-07 (×7): qty 3

## 2017-07-07 MED ORDER — TRAMADOL HCL 50 MG PO TABS: 50.0000 mg | ORAL_TABLET | Freq: Two times a day (BID) | ORAL | Status: DC | PRN

## 2017-07-07 MED ORDER — AMLODIPINE BESYLATE 5 MG PO TABS
5.0000 mg | ORAL_TABLET | Freq: Every day | ORAL | Status: DC
  Administered 2017-07-07 – 2017-07-09 (×3): 5 mg via ORAL
  Filled 2017-07-07 (×3): qty 1

## 2017-07-07 MED ORDER — POLYETHYLENE GLYCOL 3350 17 G PO PACK: 17.0000 g | PACK | Freq: Every day | ORAL | Status: DC | PRN

## 2017-07-07 MED ORDER — SODIUM CHLORIDE 0.9% FLUSH
3.0000 mL | Freq: Two times a day (BID) | INTRAVENOUS | Status: DC
  Administered 2017-07-07 – 2017-07-09 (×3): 3 mL via INTRAVENOUS

## 2017-07-07 MED ORDER — SODIUM CHLORIDE 0.9 % IV SOLN
10.0000 mL/h | Freq: Once | INTRAVENOUS | Status: AC
  Administered 2017-07-07: 10 mL/h via INTRAVENOUS

## 2017-07-07 MED ORDER — HEPARIN SODIUM (PORCINE) 5000 UNIT/ML IJ SOLN
5000.0000 [IU] | Freq: Three times a day (TID) | INTRAMUSCULAR | Status: DC
  Administered 2017-07-07 – 2017-07-08 (×3): 5000 [IU] via SUBCUTANEOUS
  Filled 2017-07-07 (×3): qty 1

## 2017-07-07 MED ORDER — ATORVASTATIN CALCIUM 20 MG PO TABS
20.0000 mg | ORAL_TABLET | Freq: Every day | ORAL | Status: DC
  Administered 2017-07-07 – 2017-07-09 (×3): 20 mg via ORAL
  Filled 2017-07-07 (×3): qty 1

## 2017-07-07 MED ORDER — ONDANSETRON HCL 4 MG PO TABS: 4.0000 mg | ORAL_TABLET | Freq: Four times a day (QID) | ORAL | Status: DC | PRN

## 2017-07-07 MED ORDER — CINACALCET HCL 30 MG PO TABS
60.0000 mg | ORAL_TABLET | Freq: Every day | ORAL | Status: DC
  Administered 2017-07-07 – 2017-07-08 (×2): 60 mg via ORAL
  Filled 2017-07-07 (×2): qty 2

## 2017-07-07 MED ORDER — SODIUM CHLORIDE 0.9 % IV SOLN: 250.0000 mL | INTRAVENOUS | Status: DC | PRN

## 2017-07-07 NOTE — Progress Notes (Signed)
Transferred from ED alert, oriented, not in distress. Blood transfusion on-going @120ml /hr

## 2017-07-07 NOTE — ED Provider Notes (Signed)
[facility]  SURGICAL Provider Note   CSN: [id number] Arrival date & time: 07/07/17  1045     History   Chief Complaint Chief Complaint  Patient presents with  . Abnormal Lab    HPI [name] [name] is a 84 y.o. male.  HPI Patient presents to the emergency department with low hemoglobin as documented by his nephrologist.  The patient states that after his last dialysis he did have bleeding from the graft and was eventually able to get it stopped.  The patient states that he was due to have a procedure done on that but it was canceled due to his low hemoglobin.  Patient's hemoglobin yesterday was 5.9.  Today is 5.8.  The patient states that he is not having any symptoms at this time.  He states he was able to drive himself to dialysis.  Patient states that nothing seems to make the condition better or worse.  The patient denies chest pain, shortness of breath, headache,blurred vision, neck pain, fever, cough, weakness, numbness, dizziness, anorexia, edema, abdominal pain, nausea, vomiting, diarrhea, rash, back pain, dysuria, hematemesis, bloody stool, near syncope, or syncope. Past Medical History:  Diagnosis Date  . Anemia of chronic disease   . Chest pain   . Coronary artery disease   . Dialysis patient ([place])    T, TH, Sat  . Dyslipidemia   . ESRD (end stage renal disease) on dialysis [facility])    "Industrial; T, Thurs, Sat" (12/26/2012)  . Gout   . Heart murmur   . Hypertension   . Metastatic cancer to bone ([place]) dx'd 04/04/17  . MYOCARDIAL INFARCTION, ACUTE, NON-Q WAVE 04/26/2009   Qualifier: Diagnosis of  By: Percival Spanish, MD, Farrel Gordon    . Obesity   . Prostate CA ([place]) dx'd 12/2014   initial dx  . Prostate cancer ([place]) dx'd 04/04/17   recurrence  . Shortness of breath    "comes up at anytime" (12/26/2012)  . Type II diabetes mellitus ([place])    "diet controlled" (12/26/2012)  . Volume overload     Patient Active Problem List   Diagnosis Date Noted  .  Anemia 05/29/2015  . Occult blood in stools 05/29/2015  . Anemia of chronic kidney failure 08/15/2014  . Shortness of breath   . Symptomatic anemia 06/11/2014  . 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 End-stage renal disease (ESRD) ([place]) 12/30/2012  . Secondary hyperparathyroidism ([place]) 12/30/2012  . Hypotension 06/16/2011  . Palpitations 06/16/2011  . CAD (coronary artery disease) of artery bypass graft 04/26/2009  . DYSPNEA ON EXERTION 03/04/2009  . HLD (hyperlipidemia) 03/03/2009  . GOUT 03/03/2009  . Obesity 03/03/2009  . Essential hypertension 03/03/2009    Past Surgical History:  Procedure Laterality Date  . ARTERIOVENOUS GRAFT PLACEMENT Right 12/2008   "upper arm"  . AV FISTULA PLACEMENT    . CARDIAC CATHETERIZATION  12/30/2012  . CORONARY ARTERY BYPASS GRAFT N/A 12/31/2012   Procedure: CORONARY ARTERY BYPASS GRAFTING times four using left internal mammary and right saphenous vein;  Surgeon: Grace Isaac, MD;  Location: [place];  Service: Open Heart Surgery;  Laterality: N/A;  . INTRAOPERATIVE TRANSESOPHAGEAL ECHOCARDIOGRAM N/A 12/31/2012   Procedure: INTRAOPERATIVE TRANSESOPHAGEAL ECHOCARDIOGRAM;  Surgeon: Grace Isaac, MD;  Location: [place];  Service: Open Heart Surgery;  Laterality: N/A;  . LEFT HEART CATHETERIZATION WITH CORONARY ANGIOGRAM N/A 12/30/2012   Procedure: LEFT HEART CATHETERIZATION WITH CORONARY ANGIOGRAM;  Surgeon: Burnell Blanks, MD;  Location: [facility] CATH LAB;  Service: Cardiovascular;  Laterality: N/A;        Home Medications    Prior to Admission medications   Medication Sig Start Date End Date Taking? Authorizing Provider  amLODipine (NORVASC) 5 MG tablet Take 1 tablet (5 mg total) by mouth daily. 05/07/17  Yes Almyra Deforest, PA  aspirin EC 81 MG tablet Take 1 tablet (81 mg total) by mouth daily. 05/07/17  Yes Almyra Deforest, PA  atorvastatin (LIPITOR) 20 MG tablet Take 1 tablet (20 mg total) by mouth daily. 05/07/17 05/02/18 Yes Almyra Deforest, PA  calcium acetate  (PHOSLO) 667 MG capsule Take 1-2 capsules (667-1,334 mg total) by mouth See admin instructions. Take 2 capsules (1334 mg) with meals and 1 capsule (667 mg) with snacks or a sandwich 08/16/14  Yes Memon, Jolaine Artist, MD  cinacalcet (SENSIPAR) 60 MG tablet Take 60 mg by mouth at bedtime.    Yes [provider]  latanoprost (XALATAN) 0.005 % ophthalmic solution Place 1 drop into both eyes at bedtime.   Yes [provider]  leuprolide (LUPRON) 30 MG injection Inject 30 mg into the muscle every 4 (four) months.   Yes [provider]  Multiple Vitamin (DAILY VITE PO) Take 1 tablet by mouth daily.   Yes [provider]  XTANDI 40 MG capsule TAKE 4 CAPSULES (160 MG TOTAL) BY MOUTH DAILY. 07/04/17  Yes Wyatt Portela, MD    Family History Family History  Problem Relation Age of Onset  . Stroke Mother   . Diabetes Sister     Social History Social History   Tobacco Use  . Smoking status: Former Smoker    Packs/day: 1.00    Years: 40.00    Pack years: 40.00    Types: Cigarettes    Last attempt to quit: 06/16/1995    Years since quitting: 22.0  . Smokeless tobacco: Never Used  Substance Use Topics  . Alcohol use: No    Alcohol/week: 0.0 oz    Comment: 12/26/2012 "none in 30 yr; drank some when I was young"  . Drug use: No     Allergies   Patient has no known allergies.   Review of Systems Review of Systems All other systems negative except as documented in the HPI. All pertinent positives and negatives as reviewed in the HPI.  Physical Exam Updated Vital Signs BP (!) 125/44   Pulse 64   Temp 97.9 F (36.6 C) (Oral)   Resp 15   Ht 6' (1.829 m)   Wt 93 kg (205 lb)   SpO2 100%   BMI 27.80 kg/m   Physical Exam  Constitutional: He is oriented to person, place, and time. He appears well-developed and well-nourished. No distress.  HENT:  Head: Normocephalic and atraumatic.  Mouth/Throat: Oropharynx is clear and moist.  Eyes: Pupils are equal,  round, and reactive to light.  Neck: Normal range of motion. Neck supple.  Cardiovascular: Normal rate, regular rhythm and normal heart sounds. Exam reveals no gallop and no friction rub.  No murmur heard. Pulmonary/Chest: Effort normal and breath sounds normal. No respiratory distress. He has no wheezes.  Abdominal: Soft. Bowel sounds are normal. He exhibits no distension. There is no tenderness.  Neurological: He is alert and oriented to person, place, and time. He exhibits normal muscle tone. Coordination normal.  Skin: Skin is warm and dry. Capillary refill takes less than 2 seconds. No rash noted. No erythema.  Psychiatric: He has a normal mood and affect. His behavior is normal.  Nursing note and vitals reviewed.    ED Treatments / Results  Labs (all labs ordered are listed, but only abnormal results are displayed) Labs Reviewed  BASIC METABOLIC PANEL - Abnormal; Notable for the following components:      Result Value   Glucose, Bld 110 (*)    Creatinine, Ser 2.49 (*)    Calcium 8.8 (*)    GFR calc non Af Amer 22 (*)    GFR calc Af Amer 26 (*)    All other components within normal limits  CBC WITH DIFFERENTIAL/PLATELET - Abnormal; Notable for the following components:   RBC 1.78 (*)    Hemoglobin 5.8 (*)    HCT 19.1 (*)    MCV 107.3 (*)    RDW 17.2 (*)    All other components within normal limits  POC OCCULT BLOOD, ED - Abnormal; Notable for the following components:   Fecal Occult Bld POSITIVE (*)    All other components within normal limits  OCCULT BLOOD X 1 CARD TO LAB, STOOL  TYPE AND SCREEN  PREPARE RBC (CROSSMATCH)    EKG None  Radiology Dg Chest 2 View  Result Date: 07/06/2017 CLINICAL DATA:  Diabetes. Hypertension. Thyroidectomy. Preoperative chest x-ray. EXAM: CHEST - 2 VIEW COMPARISON:  Chest x-ray 04/11/2017. FINDINGS: Prior CABG. Stable cardiomegaly. Low lung volumes with mild basilar atelectasis. No pleural effusion or pneumothorax. Degenerative change  thoracic spine. IMPRESSION: 1.  Prior CABG.  Stable cardiomegaly. 2.  Low lung volumes with mild basilar atelectasis. Electronically Signed   By: Marcello Moores  Register   On: 07/06/2017 13:23    Procedures Procedures (including critical care time)  Medications Ordered in ED Medications  0.9 %  sodium chloride infusion (10 mL/hr Intravenous Transfusing/Transfer 07/07/17 1519)     Initial Impression / Assessment and Plan / ED Course  I have reviewed the triage vital signs and the nursing notes.  Pertinent labs & imaging results that were available during my care of the patient were reviewed by me and considered in my medical decision making (see chart for details).    Spoke with the [facility] about admitting the patient for the significant anemia.  The patient has been stable otherwise here in the emergency department.  Patient is advised the plan and all questions were answered.  CRITICAL CARE Performed by: Resa Miner Kalijah Zeiss Total critical care time: 30 minutes Critical care time was exclusive of separately billable procedures and treating other patients. Critical care was necessary to treat or prevent imminent or life-threatening deterioration. Critical care was time spent personally by me on the following activities: development of treatment plan with patient and/or surrogate as well as nursing, discussions with consultants, evaluation of patient's response to treatment, examination of patient, obtaining history from patient or surrogate, ordering and performing treatments and interventions, ordering and review of laboratory studies, ordering and review of radiographic studies, pulse oximetry and re-evaluation of patient's condition. She was given 2 units of packed red blood cells during the emergency department due to his significant anemia.  I spoke with the [facility] about admission for the patient to the hospital.    Final Clinical Impressions(s) / ED Diagnoses   Final  diagnoses:  Symptomatic anemia    ED Discharge Orders    None       Dalia Heading, Hershal Coria 07/07/17 1533    Dorie Rank, MD 07/07/17 [phone]

## 2017-07-07 NOTE — H&P (Signed)
History and Physical    Curry Seefeldt NKN:[id number] DOB: 1930/04/14 DOA: 07/07/2017  PCP: [name], MD Patient coming from: dialysis    Chief Complaint: anemia  HPI: [name] [name] is a 84 y.o. 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  Patient reportedly had labs drawn that showed a hemoglobin of 5.9.  Patient's general surgeon for pending parathyroidectomy recommended evaluation in the ER for transfusion and this would further delay his parathyroidectomy and so his parathyroidectomy was canceled.  On review the chart patient has had several prior admissions for anemia of unclear etiology.  Apparently has had a colonoscopy about 2 years ago that did not show a reason for his anemia other than polyps but has had positive FOBT's in the past.  It is unclear if patient was on iron during these positive FOBT's.  Additionally in the past his anemia is of largely been normocytic and have thought to be related to hypoproliferative anemia secondary to CKD.  Patient himself denies any chest pain, exertional dyspnea, nausea, vomiting, diarrhea.  He denies any overt GI bleeding or hematemesis.  He denies any cough, congestion, rhinorrhea, fevers.  Of note during prior dialysis session patient apparently had significant bleeding from the AV fistula and lost a significant amount of blood that required a great deal of pressure but did not require hospitalization or urgent evaluation.  ED Course: She is vitals were stable.  Repeat hemoglobin showed to go to 5.8 with significant macrocytosis.  2 units packed red blood cells  were ordered.  Review of Systems: As per HPI otherwise 10 point review of systems negative.    Past Medical History:  Diagnosis Date  . Anemia of chronic disease   . Chest pain   . Coronary artery disease   . Dialysis patient ([place])    T, TH, Sat  . Dyslipidemia   . ESRD (end stage renal disease) on dialysis [facility])    "Industrial; T, Thurs, Sat" (12/26/2012)  . Gout   . Heart murmur   . Hypertension   . Metastatic cancer to bone ([place]) dx'd 04/04/17  . MYOCARDIAL INFARCTION, ACUTE, NON-Q WAVE 04/26/2009   Qualifier: Diagnosis of  By: Percival Spanish, MD, Farrel Gordon    . Obesity   . Prostate CA ([place]) dx'd 12/2014   initial dx  . Prostate cancer ([place]) dx'd 04/04/17   recurrence  . Shortness of breath    "comes up at anytime" (12/26/2012)  . Type II diabetes mellitus ([place])    "diet controlled" (12/26/2012)  . Volume overload     Past Surgical History:  Procedure Laterality Date  . ARTERIOVENOUS GRAFT PLACEMENT Right 12/2008   "upper arm"  . AV FISTULA PLACEMENT    . CARDIAC CATHETERIZATION  12/30/2012  . CORONARY ARTERY BYPASS GRAFT N/A 12/31/2012   Procedure: CORONARY ARTERY BYPASS GRAFTING times four using left internal mammary and right saphenous vein;  Surgeon: Grace Isaac, MD;  Location: [place];  Service: Open Heart Surgery;  Laterality: N/A;  . INTRAOPERATIVE TRANSESOPHAGEAL ECHOCARDIOGRAM N/A 12/31/2012   Procedure: INTRAOPERATIVE TRANSESOPHAGEAL ECHOCARDIOGRAM;  Surgeon: Grace Isaac, MD;  Location: [place];  Service: Open Heart Surgery;  Laterality: N/A;  . LEFT HEART CATHETERIZATION WITH CORONARY ANGIOGRAM N/A  12/30/2012   Procedure: LEFT HEART CATHETERIZATION WITH CORONARY ANGIOGRAM;  Surgeon: Burnell Blanks, MD;  Location: [facility] CATH LAB;  Service: Cardiovascular;  Laterality: N/A;     reports that he quit smoking about 22 years ago. His smoking use included cigarettes. He has a 40.00 pack-year smoking history. He has never used smokeless tobacco. He reports  that he does not drink alcohol or use drugs.  No Known Allergies  Family History  Problem Relation Age of Onset  . Stroke Mother   . Diabetes Sister      Prior to Admission medications   Medication Sig Start Date End Date Taking? Authorizing Provider  amLODipine (NORVASC) 5 MG tablet Take 1 tablet (5 mg total) by mouth daily. 05/07/17   Almyra Deforest, PA  aspirin EC 81 MG tablet Take 1 tablet (81 mg total) by mouth daily. 05/07/17   Almyra Deforest, PA  atorvastatin (LIPITOR) 20 MG tablet Take 1 tablet (20 mg total) by mouth daily. 05/07/17 05/02/18  Almyra Deforest, PA  calcium acetate (PHOSLO) 667 MG capsule Take 1-2 capsules (667-1,334 mg total) by mouth See admin instructions. Take 2 capsules (1334 mg) with meals and 1 capsule (667 mg) with snacks or a sandwich Patient taking differently: Take 2,001 mg by mouth 3 (three) times daily with meals.  08/16/14   Kathie Dike, MD  cinacalcet (SENSIPAR) 60 MG tablet Take 60 mg by mouth at bedtime.     [provider]  latanoprost (XALATAN) 0.005 % ophthalmic solution Place 1 drop into both eyes at bedtime.    [provider]  leuprolide (LUPRON) 30 MG injection Inject 30 mg into the muscle every 4 (four) months.    [provider]  Multiple Vitamin (DAILY VITE PO) Take 1 tablet by mouth daily.    [provider]  XTANDI 40 MG capsule TAKE 4 CAPSULES (160 MG TOTAL) BY MOUTH DAILY. 07/04/17   Wyatt Portela, MD    Physical Exam: Vitals:   07/07/17 1230 07/07/17 1245 07/07/17 1300 07/07/17 1315  BP: (!) 131/42 (!) 131/57 (!) 134/44 (!) 134/52  Pulse: 65  62 67  Resp: 14 15 13 14   Temp:      TempSrc:      SpO2: 100%  100% 100%  Weight:      Height:        Constitutional: NAD, calm, comfortable Vitals:   07/07/17 1230 07/07/17 1245 07/07/17 1300 07/07/17 1315  BP: (!) 131/42 (!) 131/57 (!) 134/44 (!) 134/52  Pulse: 65  62 67  Resp: 14 15 13 14   Temp:      TempSrc:      SpO2: 100%  100% 100%  Weight:        Height:       Eyes: Anicteric sclera ENMT: Mucous membranes are moist.  Good dentition, pale mucous membranes Neck: normal, supple, Respiratory: clear to auscultation bilaterally, no wheezing, no crackles. Normal respiratory effort. No accessory muscle use.  Cardiovascular: Regular rate and rhythm, no murmurs Abdomen: Soft, nontender, no rebound or guarding, plus bowel sounds Musculoskeletal: Right upper extremity fistula with palpable thrill and bruit, no lower extremity edema Skin: Well-healed midline incision over sternum Neurologic: Grossly intact, moving all extremities Psychiatric: Normal judgment and insight. Alert and oriented x 3. Normal mood.     Labs on Admission: I have personally reviewed following labs and imaging studies  CBC: Recent Labs  Lab 07/06/17 0903 07/07/17 1140  WBC 6.6 4.8  NEUTROABS  --  3.2  HGB 5.9* 5.8*  HCT 19.5* 19.1*  MCV 106.0* 107.3*  PLT 310 277   Basic Metabolic Panel: Recent Labs  Lab 07/06/17 0903 07/07/17 1140  NA 140 138  K 4.2 3.5  CL 100 98  CO2 30 30  GLUCOSE 123* 110*  BUN 27* 9  CREATININE 4.75* 2.49*  CALCIUM 9.8 8.8*   GFR: Estimated Creatinine Clearance: 23.8 mL/min (A) (by C-G formula based on SCr of 2.49 mg/dL (H)). Liver Function Tests: No results for input(s): AST, ALT, ALKPHOS, BILITOT, PROT, ALBUMIN in the last 168 hours. No results for input(s): LIPASE, AMYLASE in the last 168 hours. No results for input(s): AMMONIA in the last 168 hours. Coagulation Profile: No results for input(s): INR, PROTIME in the last 168 hours. Cardiac Enzymes: No results for input(s): CKTOTAL, CKMB, CKMBINDEX, TROPONINI in the last 168 hours. BNP (last 3 results) No results for input(s): PROBNP in the last 8760 hours. HbA1C: Recent Labs    07/06/17 0903  HGBA1C 4.0*   CBG: Recent Labs  Lab 07/06/17 0912  GLUCAP 120*   Lipid Profile: No results for input(s): CHOL, HDL, LDLCALC, TRIG, CHOLHDL, LDLDIRECT in the last 72  hours. Thyroid Function Tests: No results for input(s): TSH, T4TOTAL, FREET4, T3FREE, THYROIDAB in the last 72 hours. Anemia Panel: No results for input(s): VITAMINB12, FOLATE, FERRITIN, TIBC, IRON, RETICCTPCT in the last 72 hours. Urine analysis: No results found for: COLORURINE, APPEARANCEUR, LABSPEC, PHURINE, GLUCOSEU, HGBUR, BILIRUBINUR, KETONESUR, PROTEINUR, UROBILINOGEN, NITRITE, LEUKOCYTESUR  Radiological Exams on Admission: Dg Chest 2 View  Result Date: 07/06/2017 CLINICAL DATA:  Diabetes. Hypertension. Thyroidectomy. Preoperative chest x-ray. EXAM: CHEST - 2 VIEW COMPARISON:  Chest x-ray 04/11/2017. FINDINGS: Prior CABG. Stable cardiomegaly. Low lung volumes with mild basilar atelectasis. No pleural effusion or pneumothorax. Degenerative change thoracic spine. IMPRESSION: 1.  Prior CABG.  Stable cardiomegaly. 2.  Low lung volumes with mild basilar atelectasis. Electronically Signed   By: Marcello Moores  Register   On: 07/06/2017 13:23    EKG: Independently reviewed.  None performed  Assessment/Plan Active Problems:   HLD (hyperlipidemia)   GOUT   Essential hypertension   CAD (coronary artery disease) of artery bypass graft   End-stage renal disease (ESRD) ([facility])   S/P CABG x 4   Symptomatic anemia   Anemia of chronic kidney failure   Anemia    #) Acute on chronic anemia: Patient likely has hypoproliferative anemia secondary to CKD with superimposed anemia of unclear etiology.  He has had multiple positive fecal occult blood tests however his colonoscopy is reportedly been negative however this cannot be seen in the chart.  He is apparently followed by [facility].  He is also profoundly macrocytic at this time suggesting that this anemia was likely somewhat acute as I suspect most of the microcytosis is related to reticulocytosis.  Regardless we will do another anemia work-up.  He is already followed by oncology and so any additional work-up could be performed by them.  He is already on  EPO as an outpatient -2 units packed red blood cells -We will send off ferritin, TIBC, iron, B12, folate, reticulocyte, LDH, haptoglobin, smear  #) Coronary artery disease status post CABG/hyperlipidemia: -Continue aspirin 81 mg -Continue atorvastatin 20 mill grams daily  #) Hypertension: -Continue amlodipine 5 mg daily  #) Metastatic prostate cancer: -Next dose of leuprolide is in July - Hold enzalutamide while inpatient, restart on discharge  #) ESRD on Tuesday Thursday Saturday dialysis complicated by secondary hyperparathyroidism: Discussed with nephrology and they feel  that if patient does not have any acute needs they do not did see him during his hospitalization as long as he can tolerate the 2 units packed red blood cells. -Continue calcium acetate 3 times daily -Continue cinacalcet 60 mg nightly  Fluids: Restricted Electrolyte: Monitor and supplement Nutrition: Renal diet  Prophylaxis: Subcu heparin  Disposition: Pending transfusion  DO NOT RESUSCITATE    Cristy Folks MD [facility]   If 7PM-7AM, please contact night-coverage [email or web address] Password [email or web address]  07/07/2017, 1:33 PM

## 2017-07-07 NOTE — ED Notes (Signed)
Dr. Suzette Battiest contacted with [facility].

## 2017-07-07 NOTE — ED Notes (Signed)
Pt denies c/o . RN remains at bedsdie.

## 2017-07-07 NOTE — ED Triage Notes (Signed)
Pt arrives [facility] from Dialysis after complete run with c/o low hemaglobin and orders from dialysis MD for trans fusion of blood. Pt denies any c/o

## 2017-07-08 DIAGNOSIS — I10 Essential (primary) hypertension: Secondary | ICD-10-CM | POA: Diagnosis not present

## 2017-07-08 DIAGNOSIS — N186 End stage renal disease: Secondary | ICD-10-CM

## 2017-07-08 DIAGNOSIS — D631 Anemia in chronic kidney disease: Secondary | ICD-10-CM | POA: Diagnosis not present

## 2017-07-08 DIAGNOSIS — I25810 Atherosclerosis of coronary artery bypass graft(s) without angina pectoris: Secondary | ICD-10-CM

## 2017-07-08 DIAGNOSIS — Z951 Presence of aortocoronary bypass graft: Secondary | ICD-10-CM | POA: Diagnosis not present

## 2017-07-08 LAB — BASIC METABOLIC PANEL
BUN: 18 mg/dL (ref 8–23)
CO2: 26 mmol/L (ref 22–32)
Calcium: 9.2 mg/dL (ref 8.9–10.3)
Chloride: 101 mmol/L (ref 98–111)
Creatinine, Ser: 3.86 mg/dL — ABNORMAL HIGH (ref 0.61–1.24)
Glucose, Bld: 76 mg/dL (ref 70–99)

## 2017-07-08 LAB — IRON AND TIBC
Iron: 79 ug/dL (ref 45–182)
Saturation Ratios: 26 % (ref 17.9–39.5)
TIBC: 309 ug/dL (ref 250–450)
UIBC: 230 ug/dL

## 2017-07-08 LAB — LACTATE DEHYDROGENASE: LDH: 146 U/L (ref 98–192)

## 2017-07-08 LAB — FERRITIN: Ferritin: 454 ng/mL — ABNORMAL HIGH (ref 24–336)

## 2017-07-08 LAB — CBC
HCT: 23.3 % — ABNORMAL LOW (ref 39.0–52.0)
Hemoglobin: 7.4 g/dL — ABNORMAL LOW (ref 13.0–17.0)
MCH: 31.9 pg (ref 26.0–34.0)
MCHC: 31.8 g/dL (ref 30.0–36.0)
MCV: 100.4 fL — ABNORMAL HIGH (ref 78.0–100.0)
Platelets: 247 10*3/uL (ref 150–400)
RBC: 2.32 MIL/uL — ABNORMAL LOW (ref 4.22–5.81)
RDW: 18.1 % — ABNORMAL HIGH (ref 11.5–15.5)
WBC: 5.8 K/uL (ref 4.0–10.5)

## 2017-07-08 LAB — BASIC METABOLIC PANEL WITH GFR
Anion gap: 12 (ref 5–15)
GFR calc Af Amer: 15 mL/min — ABNORMAL LOW (ref >60)
GFR calc non Af Amer: 13 mL/min — ABNORMAL LOW (ref >60)
Potassium: 4 mmol/L (ref 3.5–5.1)
Sodium: 139 mmol/L (ref 135–145)

## 2017-07-08 LAB — PREPARE RBC (CROSSMATCH)

## 2017-07-08 LAB — RETICULOCYTES
RBC.: 2.32 MIL/uL — ABNORMAL LOW (ref 4.22–5.81)
Retic Count, Absolute: 99.8 10*3/uL (ref 19.0–186.0)
Retic Ct Pct: 4.3 % — ABNORMAL HIGH (ref 0.4–3.1)

## 2017-07-08 LAB — SAVE SMEAR

## 2017-07-08 LAB — VITAMIN B12: Vitamin B-12: 333 pg/mL (ref 180–914)

## 2017-07-08 MED ORDER — SODIUM CHLORIDE 0.9% IV SOLUTION: Freq: Once | INTRAVENOUS | Status: DC

## 2017-07-08 NOTE — Progress Notes (Signed)
PROGRESS NOTE  [name] [name] GYK:[id number] DOB: Aug 29, 1930 DOA: 07/07/2017 PCP: [name], MD   LOS: 0 days   Brief Narrative / Interim history: 84 year old male with history of end-stage renal disease on TTS schedule, hyperparathyroidism status post parathyroidectomy, coronary artery disease status post CABG in 2014, prostate cancer, who was admitted to the hospital on 6/29 due to a hemoglobin of 5.8 in the dialysis center.  Patient states that he felt a little bit more fatigued and tired over the last few weeks but denies any shortness of breath, denies any chest pain.  He had some bleeding issue with his AV fistula after his hemodialysis last Thursday but says that it did not bleed as much, his wife is a former Therapist, sports and applied pressure.  He tells me he has been noticing his stools becoming darker over the last couple weeks.  Assessment & Plan: Active Problems:   HLD (hyperlipidemia)   GOUT   Essential hypertension   CAD (coronary artery disease) of artery bypass graft   End-stage renal disease (ESRD) (HCC)   S/P CABG x 4   Symptomatic anemia   Anemia of chronic kidney failure   Anemia   Symptomatic anemia -Patient has a degree of anemia secondary to end-stage renal disease, however his hemoglobin has been in the 12-14 range earlier this year.  He now has a significant drop of at least 5-6 points.  His stools have become darker in the last few weeks and he is fecal occult was positive in the ED.  He has had prior hospitalizations in 2016-2017 with symptomatic anemia, and he has been worked up by gastroenterology in the past.  His fecal occult was negative at that time. -I have consulted GI, discussed with Dr. Penelope Coop, appreciate input  Coronary artery disease status post CABG -Continue aspirin and atorvastatin, keep hemoglobin above 8.  It 7.4 after receiving 2 units yesterday, will transfuse 1 additional unit  ESRD -Completed dialysis yesterday, seems to have tolerated 2  units of packed red blood cells without issues, will transfuse an additional unit and closely monitor respiratory status and for signs of fluid overload.  If he stays still Tuesday will call nephrology tomorrow for the dialysis  Hypertension  -Continue Norvasc  Metastatic prostate cancer -Currently taking leuprolide per urology    DVT prophylaxis: SCDs Code Status: DNR Family Communication: No family present at bedside Disposition Plan: Home when cleared by GI  Consultants:   GI  Procedures:   None  Antimicrobials:  None  Subjective: -Doing well this morning, denies any chest pain, denies any shortness of breath.  He is feeling a little bit stronger.  No abdominal pain, no nausea or vomiting.  Objective: Vitals:   07/07/17 2113 07/08/17 0514 07/08/17 0920 07/08/17 0946  BP: (!) 134/43 (!) 131/54 (!) 153/53 (!) 153/50  Pulse: 71 66 67 78  Resp:  18 18 20   Temp: 97.7 F (36.5 C) 98.5 F (36.9 C) 97.9 F (36.6 C) (!) 97.5 F (36.4 C)  TempSrc: Oral Oral Oral Oral  SpO2: 99% 100% 97% 99%  Weight:      Height:        Intake/Output Summary (Last 24 hours) at 07/08/2017 1118 Last data filed at 07/08/2017 0933 Gross per 24 hour  Intake 1382.07 ml  Output -  Net 1382.07 ml   Filed Weights   07/07/17 1053  Weight: 93 kg (205 lb)    Examination:  Constitutional: NAD Eyes: lids and conjunctivae normal ENMT: Mucous membranes  are moist.  Neck: normal, supple Respiratory: clear to auscultation bilaterally, no wheezing, no crackles. Normal respiratory effort. Cardiovascular: Regular rate and rhythm, no murmurs / rubs / gallops. No LE edema. Abdomen: no tenderness. Bowel sounds positive.  Skin: no rashes Neurologic: CN 2-12 grossly intact. Strength 5/5 in all 4.  Psychiatric: Normal judgment and insight. Alert and oriented x 3. Normal mood.    Data Reviewed: I have independently reviewed following labs and imaging studies   CBC: Recent Labs  Lab  07-27-2017 0903 07/07/17 1140 07/08/17 0003  WBC 6.6 4.8 5.8  NEUTROABS  --  3.2  --   HGB 5.9* 5.8* 7.4*  HCT 19.5* 19.1* 23.3*  MCV 106.0* 107.3* 100.4*  PLT 310 283 659   Basic Metabolic Panel: Recent Labs  Lab 07/27/17 0903 07/07/17 1140 07/08/17 0003  NA 140 138 139  K 4.2 3.5 4.0  CL 100 98 101  CO2 30 30 26   GLUCOSE 123* 110* 76  BUN 27* 9 18  CREATININE 4.75* 2.49* 3.86*  CALCIUM 9.8 8.8* 9.2   GFR: Estimated Creatinine Clearance: 15.4 mL/min (A) (by C-G formula based on SCr of 3.86 mg/dL (H)). Liver Function Tests: No results for input(s): AST, ALT, ALKPHOS, BILITOT, PROT, ALBUMIN in the last 168 hours. No results for input(s): LIPASE, AMYLASE in the last 168 hours. No results for input(s): AMMONIA in the last 168 hours. Coagulation Profile: No results for input(s): INR, PROTIME in the last 168 hours. Cardiac Enzymes: No results for input(s): CKTOTAL, CKMB, CKMBINDEX, TROPONINI in the last 168 hours. BNP (last 3 results) No results for input(s): PROBNP in the last 8760 hours. HbA1C: Recent Labs    07-27-17 0903  HGBA1C 4.0*   CBG: Recent Labs  Lab 07-27-17 0912  GLUCAP 120*   Lipid Profile: No results for input(s): CHOL, HDL, LDLCALC, TRIG, CHOLHDL, LDLDIRECT in the last 72 hours. Thyroid Function Tests: No results for input(s): TSH, T4TOTAL, FREET4, T3FREE, THYROIDAB in the last 72 hours. Anemia Panel: Recent Labs    07/08/17 0003  VITAMINB12 333  FERRITIN 454*  TIBC 309  IRON 79  RETICCTPCT 4.3*   Urine analysis: No results found for: COLORURINE, APPEARANCEUR, LABSPEC, PHURINE, GLUCOSEU, HGBUR, BILIRUBINUR, KETONESUR, PROTEINUR, UROBILINOGEN, NITRITE, LEUKOCYTESUR Sepsis Labs: Invalid input(s): PROCALCITONIN, LACTICIDVEN  No results found for this or any previous visit (from the past 240 hour(s)).    Radiology Studies: No results found.   Scheduled Meds: . sodium chloride   Intravenous Once  . amLODipine  5 mg Oral Daily  .  aspirin EC  81 mg Oral Daily  . atorvastatin  20 mg Oral Daily  . calcium acetate  2,001 mg Oral TID WC  . cinacalcet  60 mg Oral QHS  . heparin  5,000 Units Subcutaneous Q8H  . multivitamin with minerals   Oral Daily  . sodium chloride flush  3 mL Intravenous Q12H   Continuous Infusions: . sodium chloride       Marzetta Board, MD, PhD [facility] Pager [phone]  If 7PM-7AM, please contact night-coverage [email or web address] Password [email or web address] 07/08/2017, 11:18 AM

## 2017-07-08 NOTE — Consult Note (Signed)
Subjective:   HPI  The patient is an 84 year old male who is a dialysis patient.  He was found to have a low hemoglobin at the dialysis center.  He was admitted to the hospital with a hemoglobin of 5.8.  He reports to me that he has had black stools for a while now.  He does not recall taking iron or Pepto-Bismol.  His stools were heme positive in the emergency room.  Denies abdominal pain.  Review of Systems No chest pain at this time  Past Medical History:  Diagnosis Date  . Anemia of chronic disease   . Chest pain   . Coronary artery disease   . Dialysis patient ([place])    T, TH, Sat  . Dyslipidemia   . ESRD (end stage renal disease) on dialysis [facility])    "[employer]; T, Thurs, Sat" (12/26/2012)  . Gout   . Heart murmur   . Hypertension   . Metastatic cancer to bone ([place]) dx'd 04/04/17  . MYOCARDIAL INFARCTION, ACUTE, NON-Q WAVE 04/26/2009   Qualifier: Diagnosis of  By: Percival Spanish, MD, Farrel Gordon    . Obesity   . Prostate CA ([place]) dx'd 12/2014   initial dx  . Prostate cancer ([place]) dx'd 04/04/17   recurrence  . Shortness of breath    "comes up at anytime" (12/26/2012)  . Type II diabetes mellitus ([place])    "diet controlled" (12/26/2012)  . Volume overload    Past Surgical History:  Procedure Laterality Date  . ARTERIOVENOUS GRAFT PLACEMENT Right 12/2008   "upper arm"  . AV FISTULA PLACEMENT    . CARDIAC CATHETERIZATION  12/30/2012  . CORONARY ARTERY BYPASS GRAFT N/A 12/31/2012   Procedure: CORONARY ARTERY BYPASS GRAFTING times four using left internal mammary and right saphenous vein;  Surgeon: Grace Isaac, MD;  Location: [place];  Service: Open Heart Surgery;  Laterality: N/A;  . INTRAOPERATIVE TRANSESOPHAGEAL ECHOCARDIOGRAM N/A 12/31/2012   Procedure: INTRAOPERATIVE TRANSESOPHAGEAL ECHOCARDIOGRAM;  Surgeon: Grace Isaac, MD;  Location: [place];  Service: Open Heart Surgery;  Laterality: N/A;  . LEFT HEART CATHETERIZATION WITH CORONARY ANGIOGRAM N/A 12/30/2012   Procedure: LEFT HEART CATHETERIZATION WITH CORONARY ANGIOGRAM;  Surgeon: Burnell Blanks, MD;  Location: [facility] CATH LAB;  Service: Cardiovascular;  Laterality: N/A;   Social History   Socioeconomic History  . Marital status: Married    Spouse name: Not on file  . Number of children: 2  . Years of education: Not on file  . Highest education level: Not on file  Occupational History  . 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 Financial resource strain: Not on file  . Food insecurity:    Worry: Not on file    Inability: Not on file  . Transportation needs:    Medical: Not on file    Non-medical: Not on file  Tobacco Use  . Smoking status: Former Smoker    Packs/day: 1.00    Years: 40.00    Pack years: 40.00    Types: Cigarettes    Last attempt to quit: 06/16/1995    Years since quitting: 22.0  . Smokeless tobacco: Never Used  Substance and Sexual Activity  . Alcohol use: No    Alcohol/week: 0.0 oz    Comment: 12/26/2012 "none in 30 yr; drank some when I was young"  . Drug use: No  . Sexual activity: Not Currently  Lifestyle  . Physical activity:    Days per week: Not on file  Minutes per session: Not on file  . Stress: Not on file  Relationships  . Social connections:    Talks on phone: Not on file    Gets together: Not on file    Attends religious service: Not on file    Active member of club or organization: Not on file    Attends meetings of clubs or organizations: Not on file    Relationship status: Not on file  . Intimate partner violence:    Fear of current or ex partner: Not on file    Emotionally abused: Not on file    Physically abused: Not on file    Forced sexual activity: Not on file  Other Topics Concern  . Not on file  Social History Narrative   He is a retired Administrator. He lives in [place] with his wife. He has two sons   family history includes Diabetes in his sister; Stroke in his mother.  Current  Facility-Administered Medications:  .  0.9 %  sodium chloride infusion (Manually program via Guardrails IV Fluids), , Intravenous, Once, Gherghe, Costin M, MD .  0.9 %  sodium chloride infusion, 250 mL, Intravenous, PRN, Purohit, Shrey C, MD .  amLODipine (NORVASC) tablet 5 mg, 5 mg, Oral, Daily, Purohit, Shrey C, MD, 5 mg at 07/08/17 0901 .  aspirin EC tablet 81 mg, 81 mg, Oral, Daily, Purohit, Shrey C, MD, 81 mg at 07/08/17 0901 .  atorvastatin (LIPITOR) tablet 20 mg, 20 mg, Oral, Daily, Purohit, Shrey C, MD, 20 mg at 07/08/17 0901 .  calcium acetate (PHOSLO) capsule 2,001 mg, 2,001 mg, Oral, TID WC, Purohit, Shrey C, MD, 2,001 mg at 07/08/17 0900 .  cinacalcet (SENSIPAR) tablet 60 mg, 60 mg, Oral, QHS, Purohit, Shrey C, MD, 60 mg at 07/07/17 2102 .  heparin injection 5,000 Units, 5,000 Units, Subcutaneous, Q8H, Purohit, Shrey C, MD, 5,000 Units at 07/08/17 5638 .  multivitamin with minerals tablet, , Oral, Daily, Purohit, Shrey C, MD, 1 tablet at 07/08/17 0901 .  ondansetron (ZOFRAN) tablet 4 mg, 4 mg, Oral, Q6H PRN **OR** ondansetron (ZOFRAN) injection 4 mg, 4 mg, Intravenous, Q6H PRN, Purohit, Shrey C, MD .  polyethylene glycol (MIRALAX / GLYCOLAX) packet 17 g, 17 g, Oral, Daily PRN, Purohit, Shrey C, MD .  sodium chloride flush (NS) 0.9 % injection 3 mL, 3 mL, Intravenous, Q12H, Purohit, Shrey C, MD, 3 mL at 07/07/17 2103 .  sodium chloride flush (NS) 0.9 % injection 3 mL, 3 mL, Intravenous, PRN, Purohit, Shrey C, MD .  traMADol (ULTRAM) tablet 50 mg, 50 mg, Oral, Q12H PRN, Purohit, Shrey C, MD No Known Allergies   Objective:     BP (!) 153/50   Pulse 78   Temp (!) 97.5 F (36.4 C) (Oral)   Resp 20   Ht 6' (1.829 m)   Wt 93 kg (205 lb)   SpO2 99%   BMI 27.80 kg/m   No distress  Heart regular rhythm no murmurs  Lungs clear  Abdomen soft and nontender  Laboratory No components found for: D1    Assessment:     Melena  Anemia      Plan:     Transfuse blood as  needed.  We will proceed with EGD tomorrow. Lab Results  Component Value Date   HGB 7.4 (L) 07/08/2017   HGB 5.8 (LL) 07/07/2017   HGB 5.9 (LL) 07/06/2017   HGB 11.3 (L) 05/23/2017   HGB 12.4 (L) 09/29/2016   HGB 12.4 (  L) 07/03/2016   HCT 23.3 (L) 07/08/2017   HCT 19.1 (L) 07/07/2017   HCT 19.5 (L) 07/06/2017   HCT 37.4 (L) 09/29/2016   HCT 37.8 (L) 07/03/2016   HCT 27.3 (L) 05/29/2015   ALKPHOS 80 05/23/2017   ALKPHOS 78 03/28/2017   ALKPHOS 141 09/29/2016   ALKPHOS 135 07/03/2016   ALKPHOS 152 (H) 05/29/2015   AST 22 05/23/2017   AST 15 03/28/2017   AST 21 09/29/2016   AST 19 07/03/2016   AST 31 05/29/2015   AST 19 06/13/2014   ALT 12 05/23/2017   ALT 8 03/28/2017   ALT 10 09/29/2016   ALT 13 07/03/2016   ALT 23 05/29/2015   ALT 12 (L) 06/13/2014

## 2017-07-09 ENCOUNTER — Encounter (HOSPITAL_COMMUNITY)
Admission: EM | Disposition: A | Discharge status: Home / Self Care | Payer: Self-pay | Source: Home / Self Care | Attending: Emergency Medicine

## 2017-07-09 ENCOUNTER — Encounter (HOSPITAL_COMMUNITY): Payer: Self-pay | Admitting: Anesthesiology

## 2017-07-09 ENCOUNTER — Observation Stay (HOSPITAL_COMMUNITY): Payer: Medicare Other | Admitting: Certified Registered Nurse Anesthetist

## 2017-07-09 ENCOUNTER — Encounter (HOSPITAL_COMMUNITY): Admission: RE | Payer: Self-pay | Source: Ambulatory Visit

## 2017-07-09 ENCOUNTER — Inpatient Hospital Stay (HOSPITAL_COMMUNITY): Admission: RE | Admit: 2017-07-09 | Payer: Medicare Other | Source: Ambulatory Visit | Admitting: Surgery

## 2017-07-09 DIAGNOSIS — D649 Anemia, unspecified: Secondary | ICD-10-CM

## 2017-07-09 DIAGNOSIS — D631 Anemia in chronic kidney disease: Secondary | ICD-10-CM | POA: Diagnosis not present

## 2017-07-09 DIAGNOSIS — K269 Duodenal ulcer, unspecified as acute or chronic, without hemorrhage or perforation: Secondary | ICD-10-CM | POA: Diagnosis not present

## 2017-07-09 DIAGNOSIS — E1122 Type 2 diabetes mellitus with diabetic chronic kidney disease: Secondary | ICD-10-CM | POA: Diagnosis not present

## 2017-07-09 DIAGNOSIS — I10 Essential (primary) hypertension: Secondary | ICD-10-CM | POA: Diagnosis not present

## 2017-07-09 DIAGNOSIS — K2950 Unspecified chronic gastritis without bleeding: Secondary | ICD-10-CM | POA: Diagnosis not present

## 2017-07-09 DIAGNOSIS — D62 Acute posthemorrhagic anemia: Secondary | ICD-10-CM | POA: Diagnosis not present

## 2017-07-09 HISTORY — PX: ESOPHAGOGASTRODUODENOSCOPY (EGD) WITH PROPOFOL: SHX5813

## 2017-07-09 HISTORY — PX: BIOPSY: SHX5522

## 2017-07-09 LAB — BPAM RBC
Blood Product Expiration Date: 201907282359
Blood Product Expiration Date: 201907282359
Blood Product Expiration Date: 201907312359
ISSUE DATE / TIME: 201906291422
ISSUE DATE / TIME: 201906291721
ISSUE DATE / TIME: 201906300916
Unit Type and Rh: 5100
Unit Type and Rh: 5100
Unit Type and Rh: 5100

## 2017-07-09 LAB — TYPE AND SCREEN
ABO/RH(D): O POS
Antibody Screen: NEGATIVE
Unit division: 0
Unit division: 0
Unit division: 0

## 2017-07-09 LAB — BASIC METABOLIC PANEL
Anion gap: 14 (ref 5–15)
BUN: 35 mg/dL — ABNORMAL HIGH (ref 8–23)
CALCIUM: 10.1 mg/dL (ref 8.9–10.3)
CO2: 28 mmol/L (ref 22–32)
CREATININE: 6.02 mg/dL — AB (ref 0.61–1.24)
Chloride: 97 mmol/L — ABNORMAL LOW (ref 98–111)
GFR calc Af Amer: 9 mL/min — ABNORMAL LOW (ref >60)
GFR, EST NON AFRICAN AMERICAN: 8 mL/min — AB (ref >60)
Glucose, Bld: 90 mg/dL (ref 70–99)
Potassium: 4.2 mmol/L (ref 3.5–5.1)
SODIUM: 139 mmol/L (ref 135–145)

## 2017-07-09 LAB — CBC
HCT: 26.2 % — ABNORMAL LOW (ref 39.0–52.0)
Hemoglobin: 8.5 g/dL — ABNORMAL LOW (ref 13.0–17.0)
MCH: 31.6 pg (ref 26.0–34.0)
MCHC: 32.4 g/dL (ref 30.0–36.0)
MCV: 97.4 fL (ref 78.0–100.0)
PLATELETS: 246 10*3/uL (ref 150–400)
RBC: 2.69 MIL/uL — AB (ref 4.22–5.81)
RDW: 17.1 % — AB (ref 11.5–15.5)
WBC: 5.1 10*3/uL (ref 4.0–10.5)

## 2017-07-09 LAB — FOLATE RBC
Folate, Hemolysate: 615.8 ng/mL
Folate, RBC: 2324 ng/mL (ref >498)
Hematocrit: 26.5 % — ABNORMAL LOW (ref 37.5–51.0)

## 2017-07-09 SURGERY — PARATHYROIDECTOMY
Anesthesia: General

## 2017-07-09 SURGERY — ESOPHAGOGASTRODUODENOSCOPY (EGD) WITH PROPOFOL
Anesthesia: Monitor Anesthesia Care

## 2017-07-09 MED ORDER — PROPOFOL 10 MG/ML IV BOLUS
INTRAVENOUS | Status: DC | PRN
  Administered 2017-07-09: 20 mg via INTRAVENOUS

## 2017-07-09 MED ORDER — PANTOPRAZOLE SODIUM 40 MG PO TBEC: 40.0000 mg | DELAYED_RELEASE_TABLET | Freq: Every day | ORAL | 1 refills | Status: DC

## 2017-07-09 MED ORDER — LIDOCAINE HCL (CARDIAC) PF 100 MG/5ML IV SOSY
PREFILLED_SYRINGE | INTRAVENOUS | Status: DC | PRN
  Administered 2017-07-09: 80 mg via INTRAVENOUS

## 2017-07-09 MED ORDER — PROPOFOL 500 MG/50ML IV EMUL
INTRAVENOUS | Status: DC | PRN
  Administered 2017-07-09: 75 ug/kg/min via INTRAVENOUS

## 2017-07-09 MED ORDER — SODIUM CHLORIDE 0.9 % IV SOLN
INTRAVENOUS | Status: DC
  Administered 2017-07-09: 09:00:00 via INTRAVENOUS

## 2017-07-09 MED ORDER — ONDANSETRON HCL 4 MG/2ML IJ SOLN: 4.0000 mg | Freq: Once | INTRAMUSCULAR | Status: DC | PRN

## 2017-07-09 MED ORDER — SODIUM CHLORIDE 0.9 % IV SOLN
INTRAVENOUS | Status: DC | PRN
  Administered 2017-07-09: 09:00:00 via INTRAVENOUS

## 2017-07-09 MED ORDER — PANTOPRAZOLE SODIUM 40 MG PO TBEC
40.0000 mg | DELAYED_RELEASE_TABLET | Freq: Every day | ORAL | Status: DC
  Administered 2017-07-09: 40 mg via ORAL
  Filled 2017-07-09: qty 1

## 2017-07-09 MED FILL — XTANDI 40 MG CAPSULE: 40 | 30 days supply | Qty: 120 | Fill #0

## 2017-07-09 SURGICAL SUPPLY — 15 items

## 2017-07-09 NOTE — Anesthesia Postprocedure Evaluation (Signed)
Anesthesia Post Note  Patient: Kaz Auld  Procedure(s) Performed: ESOPHAGOGASTRODUODENOSCOPY (EGD) WITH PROPOFOL (N/A ) BIOPSY     Patient location during evaluation: Endoscopy Anesthesia Type: MAC Level of consciousness: oriented and awake and alert Pain management: pain level controlled Vital Signs Assessment: post-procedure vital signs reviewed and stable Respiratory status: spontaneous breathing, respiratory function stable and nonlabored ventilation Cardiovascular status: blood pressure returned to baseline and stable Postop Assessment: no apparent nausea or vomiting Anesthetic complications: no    Last Vitals:  Vitals:   07/09/17 0925 07/09/17 0926  BP: (!) 143/23   Pulse: (!) 56 (!) 58  Resp: 15 16  Temp: 36.4 C 36.4 C  SpO2: 100% 100%    Last Pain:  Vitals:   07/09/17 0926  TempSrc: Oral  PainSc: 0-No pain                 Rosealynn Mateus A.

## 2017-07-09 NOTE — Discharge Instructions (Signed)
Follow with HD as scheduled Follow up with Dr. Watt Climes in 2-3 weeks  Please get a complete blood count and chemistry panel checked by your Primary MD at your next visit, and again as instructed by your Primary MD. Please get your medications reviewed and adjusted by your Primary MD.  Please request your Primary MD to go over all Hospital Tests and Procedure/Radiological results at the follow up, please get all Hospital records sent to your Prim MD by signing hospital release before you go home.  If you had Pneumonia of Lung problems at the Hospital: Please get a 2 view Chest X ray done in 6-8 weeks after hospital discharge or sooner if instructed by your Primary MD.  If you have Congestive Heart Failure: Please call your Cardiologist or Primary MD anytime you have any of the following symptoms:  1) 3 pound weight gain in 24 hours or 5 pounds in 1 week  2) shortness of breath, with or without a dry hacking cough  3) swelling in the hands, feet or stomach  4) if you have to sleep on extra pillows at night in order to breathe  Follow cardiac low salt diet and 1.5 lit/day fluid restriction.  If you have diabetes Accuchecks 4 times/day, Once in AM empty stomach and then before each meal. Log in all results and show them to your primary doctor at your next visit. If any glucose reading is under 80 or above 300 call your primary MD immediately.  If you have Seizure/Convulsions/Epilepsy: Please do not drive, operate heavy machinery, participate in activities at heights or participate in high speed sports until you have seen by Primary MD or a Neurologist and advised to do so again.  If you had Gastrointestinal Bleeding: Please ask your Primary MD to check a complete blood count within one week of discharge or at your next visit. Your endoscopic/colonoscopic biopsies that are pending at the time of discharge, will also need to followed by your Primary MD.  Get Medicines reviewed and  adjusted. Please take all your medications with you for your next visit with your Primary MD  Please request your Primary MD to go over all hospital tests and procedure/radiological results at the follow up, please ask your Primary MD to get all Hospital records sent to his/her office.  If you experience worsening of your admission symptoms, develop shortness of breath, life threatening emergency, suicidal or homicidal thoughts you must seek medical attention immediately by calling 911 or calling your MD immediately  if symptoms less severe.  You must read complete instructions/literature along with all the possible adverse reactions/side effects for all the Medicines you take and that have been prescribed to you. Take any new Medicines after you have completely understood and accpet all the possible adverse reactions/side effects.   Do not drive or operate heavy machinery when taking Pain medications.   Do not take more than prescribed Pain, Sleep and Anxiety Medications  Special Instructions: If you have smoked or chewed Tobacco  in the last 2 yrs please stop smoking, stop any regular Alcohol  and or any Recreational drug use.  Wear Seat belts while driving.  Please note You were cared for by a hospitalist during your hospital stay. If you have any questions about your discharge medications or the care you received while you were in the hospital after you are discharged, you can call the unit and asked to speak with the hospitalist on call if the hospitalist that took care of  you is not available. Once you are discharged, your primary care physician will handle any further medical issues. Please note that NO REFILLS for any discharge medications will be authorized once you are discharged, as it is imperative that you return to your primary care physician (or establish a relationship with a primary care physician if you do not have one) for your aftercare needs so that they can reassess your need  for medications and monitor your lab values.  You can reach the hospitalist office at phone [phone] or fax [phone]   If you do not have a primary care physician, you can call [phone] for a physician referral.  Activity: As tolerated with Full fall precautions use walker/cane & assistance as needed  Diet: renal  Disposition Home

## 2017-07-09 NOTE — Transfer of Care (Signed)
Immediate Anesthesia Transfer of Care Note  Patient: [name] [name]  Procedure(s) Performed: ESOPHAGOGASTRODUODENOSCOPY (EGD) WITH PROPOFOL (N/A ) BIOPSY  Patient Location: Endoscopy Unit  Anesthesia Type:MAC  Level of Consciousness: patient cooperative and responds to stimulation  Airway & Oxygen Therapy: Patient Spontanous Breathing  Post-op Assessment: Report given to RN and Post -op Vital signs reviewed and stable  Post vital signs: Reviewed and stable  Last Vitals:  Vitals Value Taken Time  BP 141/23 07/09/2017  9:21 AM  Temp    Pulse 53 07/09/2017  9:22 AM  Resp 17 07/09/2017  9:23 AM  SpO2 100 % 07/09/2017  9:22 AM  Vitals shown include unvalidated device data.  Last Pain:  Vitals:   07/09/17 0835  TempSrc: Oral  PainSc: 0-No pain         Complications: No apparent anesthesia complications

## 2017-07-09 NOTE — Discharge Summary (Signed)
Physician Discharge Summary  [name] [name] HEN:[id number] DOB: Dec 09, 1930 DOA: 07/07/2017  PCP: [name], MD  Admit date: 07/07/2017 Discharge date: 07/09/2017  Admitted From: home Disposition:  home  Recommendations for Outpatient Follow-up:  1. Follow up with PCP in 1-2 weeks 2. Follow-up with Dr. Watt Climes in 2 weeks 3. Continue scheduled HD  Home Health: none Equipment/Devices: none  Discharge Condition: stable CODE STATUS: Full code Diet recommendation: renal  HPI: Per Dr. Herbert Moors, [name] [name] is a 84 y.o. 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  Patient reportedly had labs drawn that showed a hemoglobin of 5.9.  Patient's general surgeon for pending parathyroidectomy recommended evaluation in the ER for transfusion and this would further delay his parathyroidectomy and so his parathyroidectomy was canceled.  On review the chart patient has had several prior admissions for anemia of unclear etiology.  Apparently has had a colonoscopy about 2 years ago that did not show a reason for his anemia other than polyps but has had positive FOBT's in the past.  It is unclear if patient was on iron during these positive FOBT's.  Additionally in the past his anemia is of largely been normocytic and have thought to be related to hypoproliferative anemia secondary to CKD.  Patient himself denies any chest pain, exertional dyspnea, nausea, vomiting, diarrhea.  He denies any overt GI bleeding or hematemesis.  He denies any cough, congestion, rhinorrhea, fevers.  Of note during prior dialysis session patient  apparently had significant bleeding from the AV fistula and lost a significant amount of blood that required a great deal of pressure but did not require hospitalization or urgent evaluation.  Hospital Course: Symptomatically anemia/acute blood loss anemia/GI bleed -patient with a degree of chronic anemia secondary to end-stage renal disease, however his normal ranges between 4 and 14 earlier this year.  Hemoglobin on admission was in the 5 range, he received 3 units of packed red blood cells and improved to 8.5.  Patient reported dark stools over the last couple weeks, his fecal occult was positive for blood and gastroenterology was consulted.  He underwent an EGD on 7/1 which showed multiple duodenal ulcers, no active bleed but possibly this was a source for his melena.  He was placed on a PPI and advised against NSAIDs.  Following the procedure, his hemoglobin is stable, he is tolerating a regular diet, discussed with gastroenterology and he will be discharged home in stable condition. Coronary artery disease status post CABG -Continue aspirin and atorvastatin ESRD -no signs of fluid overload, have dialysis tomorrow as an outpatient Hypertension  -Continue Norvasc Metastatic prostate cancer -Currently taking leuprolide per urology  Discharge Diagnoses:  Active Problems:   HLD (hyperlipidemia)   GOUT   Essential hypertension   CAD (coronary artery disease) of artery bypass graft   End-stage renal disease (ESRD) (HCC)   S/P CABG x 4   Symptomatic anemia   Anemia of chronic kidney failure   Anemia     Discharge Instructions   Allergies as of 07/09/2017   No Known Allergies     Medication List    TAKE these medications   amLODipine 5 MG tablet Commonly known as:  NORVASC Take 1 tablet (5 mg  total) by mouth daily.   aspirin EC 81 MG tablet Take 1 tablet (81 mg total) by mouth daily.   atorvastatin 20 MG tablet Commonly known as:  LIPITOR Take 1 tablet (20 mg total) by mouth  daily.   calcium acetate 667 MG capsule Commonly known as:  PHOSLO Take 1-2 capsules (667-1,334 mg total) by mouth See admin instructions. Take 2 capsules (1334 mg) with meals and 1 capsule (667 mg) with snacks or a sandwich   cinacalcet 60 MG tablet Commonly known as:  SENSIPAR Take 60 mg by mouth at bedtime.   DAILY VITE PO Take 1 tablet by mouth daily.   latanoprost 0.005 % ophthalmic solution Commonly known as:  XALATAN Place 1 drop into both eyes at bedtime.   leuprolide 30 MG injection Commonly known as:  LUPRON Inject 30 mg into the muscle every 4 (four) months.   pantoprazole 40 MG tablet Commonly known as:  PROTONIX Take 1 tablet (40 mg total) by mouth daily. Start taking on:  07/10/2017   XTANDI 40 MG capsule Generic drug:  enzalutamide TAKE 4 CAPSULES (160 MG TOTAL) BY MOUTH DAILY.      Follow-up Information    Clarene Essex, MD. Schedule an appointment as soon as possible for a visit in 2 week(s).   Specialty:  Gastroenterology Contact information: [address] [place] [address] [phone]           Consultations:  GI  Procedures/Studies:  EGD 7/1 Impression:               - Normal esophagus.                           - Normal stomach. Biopsied.                           - Multiple duodenal ulcers. Recommendation:           - Resume regular diet.                           - Continue present medications.                           - Await pathology results for H pylori.    Dg Chest 2 View  Result Date: 07/06/2017 CLINICAL DATA:  Diabetes. Hypertension. Thyroidectomy. Preoperative chest x-ray. EXAM: CHEST - 2 VIEW COMPARISON:  Chest x-ray 04/11/2017. FINDINGS: Prior CABG. Stable cardiomegaly. Low lung volumes with mild basilar atelectasis. No pleural effusion or pneumothorax. Degenerative change thoracic spine. IMPRESSION: 1.  Prior CABG.  Stable cardiomegaly. 2.  Low lung volumes with mild basilar atelectasis. Electronically  Signed   By: Marcello Moores  Register   On: 07/06/2017 13:23      Subjective: - no chest pain, shortness of breath, no abdominal pain, nausea or vomiting.   Discharge Exam: Vitals:   07/09/17 0945 07/09/17 1010  BP: (!) 166/31 (!) 160/54  Pulse: (!) 56 (!) 59  Resp: 13   Temp:    SpO2: 99%     General: Pt is alert, awake, not in acute distress Cardiovascular: RRR, S1/S2 +, no rubs, no gallops Respiratory: CTA bilaterally, no wheezing, no rhonchi Abdominal: Soft, NT, ND, bowel sounds + Extremities: no edema, no cyanosis    The results of significant diagnostics from this hospitalization (including imaging,  microbiology, ancillary and laboratory) are listed below for reference.     Microbiology: No results found for this or any previous visit (from the past 240 hour(s)).   Labs: BNP (last 3 results) No results for input(s): BNP in the last 8760 hours. Basic Metabolic Panel: Recent Labs  Lab 07/06/17 0903 07/07/17 1140 07/08/17 0003 07/09/17 0653  NA 140 138 139 139  K 4.2 3.5 4.0 4.2  CL 100 98 101 97*  CO2 30 30 26 28   GLUCOSE 123* 110* 76 90  BUN 27* 9 18 35*  CREATININE 4.75* 2.49* 3.86* 6.02*  CALCIUM 9.8 8.8* 9.2 10.1   Liver Function Tests: No results for input(s): AST, ALT, ALKPHOS, BILITOT, PROT, ALBUMIN in the last 168 hours. No results for input(s): LIPASE, AMYLASE in the last 168 hours. No results for input(s): AMMONIA in the last 168 hours. CBC: Recent Labs  Lab 07/06/17 0903 07/07/17 1140 07/08/17 0003 07/09/17 0653  WBC 6.6 4.8 5.8 5.1  NEUTROABS  --  3.2  --   --   HGB 5.9* 5.8* 7.4* 8.5*  HCT 19.5* 19.1* 23.3* 26.2*  MCV 106.0* 107.3* 100.4* 97.4  PLT 310 283 247 246   Cardiac Enzymes: No results for input(s): CKTOTAL, CKMB, CKMBINDEX, TROPONINI in the last 168 hours. BNP: Invalid input(s): POCBNP CBG: Recent Labs  Lab 07/06/17 0912  GLUCAP 120*   D-Dimer No results for input(s): DDIMER in the last 72 hours. Hgb A1c No results  for input(s): HGBA1C in the last 72 hours. Lipid Profile No results for input(s): CHOL, HDL, LDLCALC, TRIG, CHOLHDL, LDLDIRECT in the last 72 hours. Thyroid function studies No results for input(s): TSH, T4TOTAL, T3FREE, THYROIDAB in the last 72 hours.  Invalid input(s): FREET3 Anemia work up Recent Labs    07/08/17 0003  VITAMINB12 333  FERRITIN 454*  TIBC 309  IRON 79  RETICCTPCT 4.3*   Urinalysis No results found for: COLORURINE, APPEARANCEUR, LABSPEC, [place], GLUCOSEU, HGBUR, BILIRUBINUR, KETONESUR, PROTEINUR, UROBILINOGEN, NITRITE, LEUKOCYTESUR Sepsis Labs Invalid input(s): PROCALCITONIN,  WBC,  LACTICIDVEN   Time coordinating discharge: 35 minutes  SIGNED:  Marzetta Board, MD  [facility] 07/09/2017, 1:53 PM Pager [phone]  If 7PM-7AM, please contact night-coverage [email or web address] Password [email or web address]

## 2017-07-09 NOTE — Progress Notes (Signed)
Patient discharged to home. Verbalizes understanding of all discharge instructions including discharge medications and follow up MD visits. Patient transported by spouse.

## 2017-07-09 NOTE — Anesthesia Preprocedure Evaluation (Addendum)
Anesthesia Evaluation  Patient identified by MRN, date of birth, ID band Patient awake    Reviewed: Allergy & Precautions, NPO status , Patient's Chart, lab work & pertinent test results  Airway Mallampati: II  TM Distance: >3 FB Neck ROM: Full    Dental  (+) Poor Dentition   Pulmonary shortness of breath and with exertion, former smoker,    Pulmonary exam normal breath sounds clear to auscultation       Cardiovascular hypertension, Pt. on medications + CAD and + Past MI  Normal cardiovascular exam+ Valvular Problems/Murmurs  Rhythm:Regular Rate:Normal  EKG 04/11/2017- NSR, inferior MI, LVH, inverted  T waves V3-V6 consistent with anterolateral ischemia  Echo 05/11/2017-Transthoracic echocardiography. Image   quality was suboptimal. The study was technically difficult.  Intravenous contrast (Definity) was administered. - Left ventricle: The cavity size was mildly dilated. Systolic   function was normal. The estimated ejection fraction was in the  range of 55% to 60%. Wall motion was normal; there were no regional wall motion abnormalities. There was an increased relative contribution of atrial contraction to ventricular filling. Doppler parameters are consistent with abnormal left ventricular relaxation (grade 1 diastolic dysfunction). - Aortic valve: Severe diffuse thickening and calcification   involving the noncoronary cusp. Noncoronary cusp immobility was noted. There was mild regurgitation. Regurgitation pressure half-time: 452 ms. - Aorta: Ascending aortic diameter: 40 mm (S). - Ascending aorta: The ascending aorta was mildly dilated. - Mitral valve: Severely calcified annulus. There was trivial   regurgitation. - Left atrium: The atrium was mildly dilated. - Right ventricle: The cavity size was moderately dilated. Wall  thickness was normal. - Pulmonic valve: There was mild regurgitation. - Pulmonary arteries: PA peak pressure:  36 mm Hg (S).  Stress test 05/18/2017- The left ventricular ejection fraction is mildly decreased (45-54%).  Nuclear stress EF: 50%.  There was no ST segment deviation noted during stress.  Defect 1: There is a small defect of severe severity present in the basal inferior and mid inferior location.  Findings consistent with prior myocardial infarction.  This is an intermediate risk study.   There is a small size, irreversible defect in the basal and mid inferior walls consistent with prior infarction, no ischemia is seen. LVEF is mildly decreased at 50%.    Neuro/Psych negative neurological ROS  negative psych ROS   GI/Hepatic Melena   Endo/Other  diabetes, Well Controlled, Type 2Hyperlipidemia  Renal/GU Dialysis and ESRFRenal disease   Prostate Ca with mets to bone negative genitourinary   Musculoskeletal  (+) Arthritis , Hx/o metastatic prostate Ca   Abdominal   Peds  Hematology  (+) anemia ,   Anesthesia Other Findings   Reproductive/Obstetrics                           Anesthesia Physical Anesthesia Plan  ASA: III  Anesthesia Plan: MAC   Post-op Pain Management:    Induction: Intravenous  PONV Risk Score and Plan: 1 and Propofol infusion, Ondansetron and Treatment may vary due to age or medical condition  Airway Management Planned: Natural Airway and Nasal Cannula  Additional Equipment:   Intra-op Plan:   Post-operative Plan:   Informed Consent: I have reviewed the patients History and Physical, chart, labs and discussed the procedure including the risks, benefits and alternatives for the proposed anesthesia with the patient or authorized representative who has indicated his/her understanding and acceptance.   Dental advisory given  Plan Discussed with: CRNA  and Surgeon  Anesthesia Plan Comments:         Anesthesia Quick Evaluation

## 2017-07-09 NOTE — H&P (Signed)
Patient presents to the endoscopy unit for EGD to evaluate anemia and heme positive stool and melena.  Physical:  No distress  Heart regular rhythm  Lungs clear  Abdomen soft nontender  Impression: Anemia heme positive stool  Plan: EGD

## 2017-07-09 NOTE — Op Note (Signed)
[facility] Patient Name: [name] [name] Procedure Date : 07/09/2017 MRN: [id number] Attending MD: Wonda Horner , MD Date of Birth: April 11, 1930 CSN: [id number] Age: 84 Admit Type: Inpatient Procedure:                Upper GI endoscopy Indications:              Acute post hemorrhagic anemia, Melena Providers:                Wonda Horner, MD, Cleda Daub, RN, Elspeth Cho Tech., Technician, Tressia Miners, CRNA Referring MD:              Medicines:                Propofol per Anesthesia Complications:            No immediate complications. Estimated Blood Loss:     Estimated blood loss: none. Procedure:                Pre-Anesthesia Assessment:                           - Prior to the procedure, a History and Physical                            was performed, and patient medications and                            allergies were reviewed. The patient's tolerance of                            previous anesthesia was also reviewed. The risks                            and benefits of the procedure and the sedation                            options and risks were discussed with the patient.                            All questions were answered, and informed consent                            was obtained. Prior Anticoagulants: The patient has                            taken no previous anticoagulant or antiplatelet                            agents. ASA Grade Assessment: III - A patient with                            severe systemic disease. After reviewing the risks  and benefits, the patient was deemed in                            satisfactory condition to undergo the procedure.                           After obtaining informed consent, the endoscope was                            passed under direct vision. Throughout the                            procedure, the patient's blood pressure, pulse, and                oxygen saturations were monitored continuously. The                            EG-[id number] ([id number]) scope was introduced through the                            mouth, and advanced to the second part of duodenum.                            The upper GI endoscopy was accomplished without                            difficulty. The patient tolerated the procedure                            well. Scope In: Scope Out: Findings:      The examined esophagus was normal.      The entire examined stomach was normal. Biopsies were taken with a cold       forceps for Helicobacter pylori testing.      Two duodenal ulcers were found in the duodenal bulb. They were small.       Not bleeding at this time. Impression:               - Normal esophagus.                           - Normal stomach. Biopsied.                           - Multiple duodenal ulcers. Recommendation:           - Resume regular diet.                           - Continue present medications.                           - Await pathology results for H pylori. Procedure Code(s):        --- Professional ---                           [phone], Esophagogastroduodenoscopy, flexible,  transoral; with biopsy, single or multiple Diagnosis Code(s):        --- Professional ---                           K26.9, Duodenal ulcer, unspecified as acute or                            chronic, without hemorrhage or perforation                           D62, Acute posthemorrhagic anemia                           K92.1, Melena (includes Hematochezia) CPT copyright 2017 American Medical Association. All rights reserved. The codes documented in this report are preliminary and upon coder review may  be revised to meet current compliance requirements. Wonda Horner, MD 07/09/2017 9:25:39 AM This report has been signed electronically. Number of Addenda: 0

## 2017-07-10 ENCOUNTER — Encounter (HOSPITAL_COMMUNITY): Payer: Self-pay | Admitting: Gastroenterology

## 2017-07-10 LAB — HAPTOGLOBIN: Haptoglobin: 160 mg/dL (ref 34–200)

## 2017-07-18 ENCOUNTER — Telehealth: Payer: Self-pay

## 2017-07-18 ENCOUNTER — Inpatient Hospital Stay: Payer: Medicare Other

## 2017-07-18 ENCOUNTER — Other Ambulatory Visit: Payer: Self-pay

## 2017-07-18 ENCOUNTER — Encounter: Payer: Self-pay | Admitting: Oncology

## 2017-07-18 ENCOUNTER — Inpatient Hospital Stay: Payer: Medicare Other | Attending: Oncology | Admitting: Oncology

## 2017-07-18 VITALS — BP 142/40 | HR 71 | Temp 97.9°F | Resp 18 | Ht 72.0 in | Wt 200.0 lb

## 2017-07-18 DIAGNOSIS — K269 Duodenal ulcer, unspecified as acute or chronic, without hemorrhage or perforation: Secondary | ICD-10-CM | POA: Diagnosis not present

## 2017-07-18 DIAGNOSIS — Z9981 Dependence on supplemental oxygen: Secondary | ICD-10-CM | POA: Insufficient documentation

## 2017-07-18 DIAGNOSIS — C61 Malignant neoplasm of prostate: Secondary | ICD-10-CM

## 2017-07-18 DIAGNOSIS — N19 Unspecified kidney failure: Secondary | ICD-10-CM

## 2017-07-18 DIAGNOSIS — K921 Melena: Secondary | ICD-10-CM | POA: Insufficient documentation

## 2017-07-18 DIAGNOSIS — E291 Testicular hypofunction: Secondary | ICD-10-CM | POA: Diagnosis not present

## 2017-07-18 DIAGNOSIS — D500 Iron deficiency anemia secondary to blood loss (chronic): Secondary | ICD-10-CM | POA: Diagnosis not present

## 2017-07-18 DIAGNOSIS — D649 Anemia, unspecified: Secondary | ICD-10-CM

## 2017-07-18 LAB — CBC WITH DIFFERENTIAL (CANCER CENTER ONLY)
Basophils Absolute: 0 10*3/uL (ref 0.0–0.1)
Basophils Relative: 1 %
EOS PCT: 2 %
Eosinophils Absolute: 0.1 10*3/uL (ref 0.0–0.5)
HCT: 19.3 % — ABNORMAL LOW (ref 38.4–49.9)
Hemoglobin: 6.4 g/dL — CL (ref 13.0–17.1)
LYMPHS ABS: 1 10*3/uL (ref 0.9–3.3)
Lymphocytes Relative: 20 %
MCH: 32.8 pg (ref 27.2–33.4)
MCHC: 32.9 g/dL (ref 32.0–36.0)
MCV: 99.6 fL — AB (ref 79.3–98.0)
MONO ABS: 0.5 10*3/uL (ref 0.1–0.9)
MONOS PCT: 10 %
NEUTROS ABS: 3.4 10*3/uL (ref 1.5–6.5)
NEUTROS PCT: 67 %
PLATELETS: 264 10*3/uL (ref 140–400)
RBC: 1.94 MIL/uL — ABNORMAL LOW (ref 4.20–5.82)
RDW: 16.9 % — AB (ref 11.0–14.6)
WBC Count: 5.1 10*3/uL (ref 4.0–10.3)

## 2017-07-18 LAB — CMP (CANCER CENTER ONLY)
ALT: 12 U/L (ref 0–44)
AST: 21 U/L (ref 15–41)
Albumin: 3.2 g/dL — ABNORMAL LOW (ref 3.5–5.0)
Alkaline Phosphatase: 68 U/L (ref 38–126)
Anion gap: 10 (ref 5–15)
BILIRUBIN TOTAL: 0.8 mg/dL (ref 0.3–1.2)
BUN: 36 mg/dL — ABNORMAL HIGH (ref 8–23)
CO2: 32 mmol/L (ref 22–32)
CREATININE: 5.19 mg/dL — AB (ref 0.61–1.24)
Calcium: 10.6 mg/dL — ABNORMAL HIGH (ref 8.9–10.3)
Chloride: 99 mmol/L (ref 98–111)
GFR, EST AFRICAN AMERICAN: 11 mL/min — AB (ref >60)
GFR, Estimated: 9 mL/min — ABNORMAL LOW (ref >60)
Glucose, Bld: 96 mg/dL (ref 70–99)
POTASSIUM: 4.1 mmol/L (ref 3.5–5.1)
Sodium: 141 mmol/L (ref 135–145)
TOTAL PROTEIN: 6.1 g/dL — AB (ref 6.5–8.1)

## 2017-07-18 NOTE — Telephone Encounter (Signed)
Received notification of pt Hgb 6.4. Per Dr. Alen Blew, pt to be scheduled today or Friday for blood as pt has dialysis tomorrow. No space in infusion today. Pt appt scheduled for lab and blood transfusion on Friday (07/20/17). Orders placed for 2U PRBCs pre-medicated with tylenol and benadryl. Type and screen released to be used by lab on Friday.

## 2017-07-18 NOTE — Telephone Encounter (Signed)
Printed avs and calender of upcoming appointment. Patient has dialysis on Thursday's and request return visit to be on Wednesday instead. Per 7/10 los

## 2017-07-18 NOTE — Progress Notes (Signed)
Hematology and Oncology Follow Up Visit  [name] [name] [id number] [birth year] 84 y.o. 07/18/2017 10:19 AM [name] [name], MDElkins, [name] [name], *   Principle Diagnosis: 84 year old man with castration-resistant prostate cancer diagnosed in 2017 and developed castration resistant disease in March 2019.  He presented initially with PSA of 369 and a prostate biopsy showed a Gleason score 4+4 = 8.     Prior Therapy: Androgen deprivation therapy and he remains on it under the care of Dr. Diona Fanti. His PSA decreased to 31 in 2017. His PSA in November 2017 was 41 and in March 2018 was 29.    Current therapy: Xtandi 160 mg daily started in March 2019.  Interim History: [name] [name] is here for a follow-up visit.  Since last visit, he was hospitalized on 07/08/2015 and was discharged on July 09, 2017 for worsening anemia and GI bleed.  He underwent EGD which showed multiple duodenal ulcers but no active bleeding.  Since his discharge, he has felt better although he started developing more fatigue and tiredness in the last few days.  He continues to have melena in his stool.  He denies any chest pain or difficulty breathing.  He continues to take Xtandi without any major complications.  He denies any nausea or abdominal distention.  He denies any bone pain or pathological fractures.  He continues to ambulate without any difficulties.   He does not report any headaches, blurry vision, syncope or seizures.  He denies any confusion or alteration in mental status.  He denied any fevers, chills, sweats or weight loss. He does not report any chest pain, palpitation, orthopnea or leg edema. He does not report any cough, wheezing or hemoptysis. He does not report any nausea, vomiting or abdominal pain. He does not report any hemoptysis or hematemesis.  He does not report any ecchymosis or rash. He denies any arthralgias or myalgias.  He denies any lymphadenopathy or petechiae.  Does not report any anxiety or  depression.  He denies any bleeding or clotting tendencies.  Remaining review of system is negative.    Medications: I have reviewed the patient's current medications.  Current Outpatient Medications  Medication Sig Dispense Refill  . amLODipine (NORVASC) 5 MG tablet Take 1 tablet (5 mg total) by mouth daily. 90 tablet 3  . aspirin EC 81 MG tablet Take 1 tablet (81 mg total) by mouth daily. 90 tablet 3  . atorvastatin (LIPITOR) 20 MG tablet Take 1 tablet (20 mg total) by mouth daily. 90 tablet 3  . calcium acetate (PHOSLO) 667 MG capsule Take 1-2 capsules (667-1,334 mg total) by mouth See admin instructions. Take 2 capsules (1334 mg) with meals and 1 capsule (667 mg) with snacks or a sandwich 180 capsule 1  . cinacalcet (SENSIPAR) 60 MG tablet Take 60 mg by mouth at bedtime.     Marland Kitchen latanoprost (XALATAN) 0.005 % ophthalmic solution Place 1 drop into both eyes at bedtime.    Marland Kitchen leuprolide (LUPRON) 30 MG injection Inject 30 mg into the muscle every 4 (four) months.    . Multiple Vitamin (DAILY VITE PO) Take 1 tablet by mouth daily.    . pantoprazole (PROTONIX) 40 MG tablet Take 1 tablet (40 mg total) by mouth daily. 30 tablet 1  . XTANDI 40 MG capsule TAKE 4 CAPSULES (160 MG TOTAL) BY MOUTH DAILY. 120 capsule 0   No current facility-administered medications for this visit.      Allergies: No Known Allergies  Past Medical History, Surgical history,  Social history, and Family History were reviewed and updated.  Physical Exam: Blood pressure (!) 142/40, pulse 71, temperature 97.9 F (36.6 C), temperature source Oral, resp. rate 18, height 6' (1.829 m), weight 200 lb (90.7 kg), SpO2 100 %.    ECOG: 1 General appearance: Well-appearing gentleman without distress. Head: Normocephalic without abnormalities.. Eyes: Sclera anicteric. Oropharynx: No oral thrush or ulcers. Lymph nodes: No cervical, supraclavicular, or axillary lymphadenopathy. Heart: Regular rate and rhythm without any murmurs  or gallops. Lung: Clear without any rhonchi, wheezes or dullness to percussion. Abdomin: Soft, without any rebound or guarding.  No shifting dullness or ascites. Musculoskeletal: No clubbing or cyanosis. Skin: No rashes or lesions. Neurological: No motor or sensory deficits.   Lab Results: Lab Results  Component Value Date   WBC 5.1 07/09/2017   HGB 8.5 (L) 07/09/2017   HCT 26.2 (L) 07/09/2017   MCV 97.4 07/09/2017   PLT 246 07/09/2017     Chemistry      Component Value Date/Time   NA 139 07/09/2017 0653   NA 143 09/29/2016 0810   K 4.2 07/09/2017 0653   K 4.3 09/29/2016 0810   CL 97 (L) 07/09/2017 0653   CO2 28 07/09/2017 0653   CO2 31 (H) 09/29/2016 0810   BUN 35 (H) 07/09/2017 0653   BUN 27.9 (H) 09/29/2016 0810   CREATININE 6.02 (H) 07/09/2017 0653   CREATININE 6.44 (HH) 05/23/2017 1007   CREATININE 7.4 (HH) 09/29/2016 0810      Component Value Date/Time   CALCIUM 10.1 07/09/2017 0653   CALCIUM 9.9 09/29/2016 0810   ALKPHOS 80 05/23/2017 1007   ALKPHOS 141 09/29/2016 0810   AST 22 05/23/2017 1007   AST 21 09/29/2016 0810   ALT 12 05/23/2017 1007   ALT 10 09/29/2016 0810   BILITOT 0.7 05/23/2017 1007   BILITOT 0.48 09/29/2016 0810        Results for [name] [name] (MRN [id number]) as of 07/18/2017 10:07  Ref. Range 03/28/2017 08:32 05/23/2017 10:07  Prostate Specific Ag, Serum Latest Ref Range: 0.0 - 4.0 ng/mL 63.5 (H) 7.7 (H)     Impression and Plan:   84 year old gentleman with the following issues:  1.  Castration-resistant prostate cancer documented in March 2019 with a PSA of 63.5.   He remains on Xtandi without any complications at this time.  He had an excellent response to therapy with PSA dropping to 7.7 after 2 months of therapy.  Risks and benefits of continuing this therapy was reviewed today is agreeable to continue.  2. Androgen deprivation therapy: He remains on Lupron without any complications.  I have recommended continuing this  indefinitely.  3. Bone directed therapy: He is on Xgeva without any complications.  I have no objections to continuing this at this time.  4.  Renal failure: He remains on hemodialysis without any issues.  5.  Anemia: He has an element of GI bleed with duodenal ulcer as well as heme positive stool.  His hemoglobin has drifted again to less than 7 after his recent discharge on July 09, 2017.  He has follow-up with gastroenterology in the immediate future but I will transfusion with 2 units of packed red cells at this time.  Other causes of anemia are considered less likely.  I see no evidence to suggest hemolysis at this time.  His hemoglobin was normal in April 2019.  6.  Prognosis and goals of care: He has incurable malignancy that has been palliated adequately.  Aggressive therapy  is warranted.  7. Follow-up: In 2 months to follow his progress.  25  minutes was spent with the patient face-to-face today.  More than 50% of time was dedicated to patient counseling, education and coordinating his care including arranging for packed red cell transfusion and discussing future plan of care.   Zola Button, MD 7/10/201910:19 AM

## 2017-07-19 ENCOUNTER — Telehealth: Payer: Self-pay

## 2017-07-19 LAB — PROSTATE-SPECIFIC AG, SERUM (LABCORP): PROSTATE SPECIFIC AG, SERUM: 1.6 ng/mL (ref 0.0–4.0)

## 2017-07-19 NOTE — Telephone Encounter (Signed)
Called pt to notify that PSA is down to 1.6. Pt verbalized understanding and confirmed appt for tomorrow's labs and blood. Pt plans to arrive at 0715 in the morning to get registered.

## 2017-07-20 ENCOUNTER — Inpatient Hospital Stay: Payer: Medicare Other

## 2017-07-20 ENCOUNTER — Other Ambulatory Visit: Payer: Self-pay

## 2017-07-20 DIAGNOSIS — D649 Anemia, unspecified: Secondary | ICD-10-CM

## 2017-07-20 DIAGNOSIS — C61 Malignant neoplasm of prostate: Secondary | ICD-10-CM | POA: Diagnosis not present

## 2017-07-20 LAB — ABO/RH: ABO/RH(D): O POS

## 2017-07-20 LAB — PREPARE RBC (CROSSMATCH)

## 2017-07-20 MED ORDER — DIPHENHYDRAMINE HCL 25 MG PO CAPS
ORAL_CAPSULE | ORAL | Status: AC
  Filled 2017-07-20: qty 1

## 2017-07-20 MED ORDER — DIPHENHYDRAMINE HCL 25 MG PO CAPS
25.0000 mg | ORAL_CAPSULE | Freq: Once | ORAL | Status: AC
  Administered 2017-07-20: 25 mg via ORAL

## 2017-07-20 MED ORDER — ACETAMINOPHEN 325 MG PO TABS
650.0000 mg | ORAL_TABLET | Freq: Once | ORAL | Status: AC
  Administered 2017-07-20: 650 mg via ORAL

## 2017-07-20 MED ORDER — SODIUM CHLORIDE 0.9 % IV SOLN
250.0000 mL | Freq: Once | INTRAVENOUS | Status: AC
  Administered 2017-07-20: 250 mL via INTRAVENOUS

## 2017-07-20 MED ORDER — ACETAMINOPHEN 325 MG PO TABS
ORAL_TABLET | ORAL | Status: AC
  Filled 2017-07-20: qty 2

## 2017-07-20 NOTE — Patient Instructions (Signed)

## 2017-07-21 LAB — TYPE AND SCREEN
ABO/RH(D): O POS
Antibody Screen: NEGATIVE
UNIT DIVISION: 0
Unit division: 0

## 2017-07-21 LAB — BPAM RBC
BLOOD PRODUCT EXPIRATION DATE: 201908112359
Blood Product Expiration Date: 201908112359
ISSUE DATE / TIME: 201907120848
ISSUE DATE / TIME: 201907120848
UNIT TYPE AND RH: 5100
Unit Type and Rh: 5100

## 2017-07-31 ENCOUNTER — Other Ambulatory Visit: Payer: Self-pay | Admitting: Oncology

## 2017-07-31 DIAGNOSIS — C61 Malignant neoplasm of prostate: Secondary | ICD-10-CM

## 2017-08-03 ENCOUNTER — Telehealth: Payer: Self-pay | Admitting: *Deleted

## 2017-08-03 NOTE — Telephone Encounter (Signed)
"  Eudelia Bunch RN, [facility] calling to make referral for this mutual patient to receive blood transfusion.  07-26-2017 HGB = 8.2.  Called to check on him today because 08-02-2017 results HGB = 6.6.  He says he is seen there for blood.  He is not symptomatic.  No complaints of chest pain, s.o.b or any other symptoms reported today." Provided Elmyra Ricks with POD 5 fax number to send results.

## 2017-08-06 ENCOUNTER — Telehealth: Payer: Self-pay | Admitting: Oncology

## 2017-08-06 ENCOUNTER — Other Ambulatory Visit: Payer: Self-pay | Admitting: *Deleted

## 2017-08-06 DIAGNOSIS — D649 Anemia, unspecified: Secondary | ICD-10-CM

## 2017-08-06 MED FILL — XTANDI 40 MG CAPSULE: 40 | 30 days supply | Qty: 120 | Fill #0

## 2017-08-06 NOTE — Telephone Encounter (Signed)
Called pt re appts that were added per 7/29 sch msg - spoke w/ pt re appts.

## 2017-08-06 NOTE — Progress Notes (Signed)
[place] to schedulers for type  and x-match and 5 hour blood transfusion, this week per dr Alen Blew

## 2017-08-08 ENCOUNTER — Other Ambulatory Visit: Payer: Self-pay | Admitting: Oncology

## 2017-08-08 DIAGNOSIS — D649 Anemia, unspecified: Secondary | ICD-10-CM

## 2017-08-09 ENCOUNTER — Inpatient Hospital Stay: Payer: Medicare Other

## 2017-08-09 ENCOUNTER — Inpatient Hospital Stay: Payer: Medicare Other | Attending: Oncology

## 2017-08-09 DIAGNOSIS — D500 Iron deficiency anemia secondary to blood loss (chronic): Secondary | ICD-10-CM | POA: Diagnosis present

## 2017-08-09 DIAGNOSIS — K921 Melena: Secondary | ICD-10-CM | POA: Diagnosis not present

## 2017-08-09 DIAGNOSIS — D649 Anemia, unspecified: Secondary | ICD-10-CM

## 2017-08-09 LAB — CBC WITH DIFFERENTIAL (CANCER CENTER ONLY)
Basophils Absolute: 0 10*3/uL (ref 0.0–0.1)
Basophils Relative: 0 %
Eosinophils Absolute: 0.1 10*3/uL (ref 0.0–0.5)
Eosinophils Relative: 1 %
HCT: 20.9 % — ABNORMAL LOW (ref 38.4–49.9)
Hemoglobin: 6.4 g/dL — CL (ref 13.0–17.1)
Lymphocytes Relative: 23 %
Lymphs Abs: 1 10*3/uL (ref 0.9–3.3)
MCH: 32.8 pg (ref 27.2–33.4)
MCHC: 30.6 g/dL — AB (ref 32.0–36.0)
MCV: 107.2 fL — AB (ref 79.3–98.0)
MONO ABS: 0.3 10*3/uL (ref 0.1–0.9)
Monocytes Relative: 7 %
Neutro Abs: 2.9 10*3/uL (ref 1.5–6.5)
Neutrophils Relative %: 69 %
PLATELETS: 259 10*3/uL (ref 140–400)
RBC: 1.95 MIL/uL — ABNORMAL LOW (ref 4.20–5.82)
RDW: 17.9 % — AB (ref 11.0–14.6)
WBC Count: 4.2 10*3/uL (ref 4.0–10.3)

## 2017-08-09 LAB — SAMPLE TO BLOOD BANK

## 2017-08-09 LAB — PREPARE RBC (CROSSMATCH)

## 2017-08-09 MED ORDER — DIPHENHYDRAMINE HCL 25 MG PO CAPS
ORAL_CAPSULE | ORAL | Status: AC
  Filled 2017-08-09: qty 1

## 2017-08-09 MED ORDER — SODIUM CHLORIDE 0.9% IV SOLUTION
250.0000 mL | Freq: Once | INTRAVENOUS | Status: AC
  Administered 2017-08-09: 250 mL via INTRAVENOUS
  Filled 2017-08-09: qty 250

## 2017-08-09 MED ORDER — ACETAMINOPHEN 325 MG PO TABS
650.0000 mg | ORAL_TABLET | Freq: Once | ORAL | Status: AC
  Administered 2017-08-09: 650 mg via ORAL

## 2017-08-09 MED ORDER — ACETAMINOPHEN 325 MG PO TABS
ORAL_TABLET | ORAL | Status: AC
  Filled 2017-08-09: qty 2

## 2017-08-09 MED ORDER — DIPHENHYDRAMINE HCL 25 MG PO CAPS
25.0000 mg | ORAL_CAPSULE | Freq: Once | ORAL | Status: AC
  Administered 2017-08-09: 25 mg via ORAL

## 2017-08-09 NOTE — Patient Instructions (Signed)
Blood Transfusion, Adult, Care After This sheet gives you information about how to care for yourself after your procedure. Your health care provider may also give you more specific instructions. If you have problems or questions, contact your health care provider. What can I expect after the procedure? After your procedure, it is common to have:  Bruising and soreness where the IV tube was inserted.  Headache.  Follow these instructions at home:  Take over-the-counter and prescription medicines only as told by your health care provider.  Return to your normal activities as told by your health care provider.  Follow instructions from your health care provider about how to take care of your IV insertion site. Make sure you: ? Wash your hands with soap and water before you change your bandage (dressing). If soap and water are not available, use hand sanitizer. ? Change your dressing as told by your health care provider.  Check your IV insertion site every day for signs of infection. Check for: ? More redness, swelling, or pain. ? More fluid or blood. ? Warmth. ? Pus or a bad smell. Contact a health care provider if:  You have more redness, swelling, or pain around the IV insertion site.  You have more fluid or blood coming from the IV insertion site.  Your IV insertion site feels warm to the touch.  You have pus or a bad smell coming from the IV insertion site.  Your urine turns pink, red, or brown.  You feel weak after doing your normal activities. Get help right away if:  You have signs of a serious allergic or immune system reaction, including: ? Itchiness. ? Hives. ? Trouble breathing. ? Anxiety. ? Chest or lower back pain. ? Fever, flushing, and chills. ? Rapid pulse. ? Rash. ? Diarrhea. ? Vomiting. ? Dark urine. ? Serious headache. ? Dizziness. ? Stiff neck. ? Yellow coloration of the face or the white parts of the eyes (jaundice). This information is not  intended to replace advice given to you by your health care provider. Make sure you discuss any questions you have with your health care provider. Document Released: 01/16/2014 Document Revised: 08/25/2015 Document Reviewed: 07/12/2015 Elsevier Interactive Patient Education  2018 [employer] Inc[employer]  

## 2017-08-10 LAB — TYPE AND SCREEN
ABO/RH(D): O POS
ANTIBODY SCREEN: NEGATIVE
Unit division: 0
Unit division: 0

## 2017-08-10 LAB — BPAM RBC
Blood Product Expiration Date: 201909072359
Blood Product Expiration Date: 201909072359
ISSUE DATE / TIME: 201908011248
ISSUE DATE / TIME: 201908011438
UNIT TYPE AND RH: 5100
UNIT TYPE AND RH: 5100

## 2017-08-28 ENCOUNTER — Other Ambulatory Visit: Payer: Self-pay | Admitting: Oncology

## 2017-08-28 DIAGNOSIS — C61 Malignant neoplasm of prostate: Secondary | ICD-10-CM

## 2017-08-30 ENCOUNTER — Encounter: Payer: Self-pay | Admitting: Cardiology

## 2017-08-30 MED FILL — XTANDI 40 MG CAPSULE: 40 | 30 days supply | Qty: 120 | Fill #0

## 2017-09-07 ENCOUNTER — Encounter (HOSPITAL_COMMUNITY): Payer: Self-pay

## 2017-09-07 ENCOUNTER — Ambulatory Visit: Payer: Self-pay | Admitting: Cardiology

## 2017-09-07 ENCOUNTER — Other Ambulatory Visit: Payer: Self-pay

## 2017-09-07 ENCOUNTER — Observation Stay (HOSPITAL_COMMUNITY)
Admission: EM | Admit: 2017-09-07 | Discharge: 2017-09-08 | Disposition: A | Discharge status: Home / Self Care | Payer: Medicare Other | Attending: Internal Medicine | Admitting: Internal Medicine

## 2017-09-07 DIAGNOSIS — C7951 Secondary malignant neoplasm of bone: Secondary | ICD-10-CM | POA: Insufficient documentation

## 2017-09-07 DIAGNOSIS — D649 Anemia, unspecified: Secondary | ICD-10-CM

## 2017-09-07 DIAGNOSIS — E785 Hyperlipidemia, unspecified: Secondary | ICD-10-CM | POA: Diagnosis not present

## 2017-09-07 DIAGNOSIS — C61 Malignant neoplasm of prostate: Secondary | ICD-10-CM | POA: Diagnosis present

## 2017-09-07 DIAGNOSIS — Z8546 Personal history of malignant neoplasm of prostate: Secondary | ICD-10-CM | POA: Diagnosis not present

## 2017-09-07 DIAGNOSIS — N189 Chronic kidney disease, unspecified: Secondary | ICD-10-CM

## 2017-09-07 DIAGNOSIS — Z992 Dependence on renal dialysis: Secondary | ICD-10-CM | POA: Diagnosis not present

## 2017-09-07 DIAGNOSIS — M109 Gout, unspecified: Secondary | ICD-10-CM | POA: Diagnosis not present

## 2017-09-07 DIAGNOSIS — I120 Hypertensive chronic kidney disease with stage 5 chronic kidney disease or end stage renal disease: Principal | ICD-10-CM | POA: Insufficient documentation

## 2017-09-07 DIAGNOSIS — Z87891 Personal history of nicotine dependence: Secondary | ICD-10-CM | POA: Diagnosis not present

## 2017-09-07 DIAGNOSIS — T82838A Hemorrhage of vascular prosthetic devices, implants and grafts, initial encounter: Secondary | ICD-10-CM | POA: Insufficient documentation

## 2017-09-07 DIAGNOSIS — N186 End stage renal disease: Secondary | ICD-10-CM | POA: Insufficient documentation

## 2017-09-07 DIAGNOSIS — Z66 Do not resuscitate: Secondary | ICD-10-CM | POA: Diagnosis not present

## 2017-09-07 DIAGNOSIS — I2510 Atherosclerotic heart disease of native coronary artery without angina pectoris: Secondary | ICD-10-CM | POA: Insufficient documentation

## 2017-09-07 DIAGNOSIS — Y828 Other medical devices associated with adverse incidents: Secondary | ICD-10-CM | POA: Insufficient documentation

## 2017-09-07 DIAGNOSIS — D631 Anemia in chronic kidney disease: Secondary | ICD-10-CM | POA: Diagnosis present

## 2017-09-07 DIAGNOSIS — D62 Acute posthemorrhagic anemia: Secondary | ICD-10-CM

## 2017-09-07 DIAGNOSIS — Z7982 Long term (current) use of aspirin: Secondary | ICD-10-CM | POA: Diagnosis not present

## 2017-09-07 DIAGNOSIS — E1122 Type 2 diabetes mellitus with diabetic chronic kidney disease: Secondary | ICD-10-CM | POA: Insufficient documentation

## 2017-09-07 DIAGNOSIS — R011 Cardiac murmur, unspecified: Secondary | ICD-10-CM | POA: Insufficient documentation

## 2017-09-07 DIAGNOSIS — I252 Old myocardial infarction: Secondary | ICD-10-CM | POA: Diagnosis not present

## 2017-09-07 DIAGNOSIS — I25810 Atherosclerosis of coronary artery bypass graft(s) without angina pectoris: Secondary | ICD-10-CM | POA: Diagnosis not present

## 2017-09-07 DIAGNOSIS — Z79899 Other long term (current) drug therapy: Secondary | ICD-10-CM | POA: Diagnosis not present

## 2017-09-07 LAB — CBC WITH DIFFERENTIAL/PLATELET
Abs Immature Granulocytes: 0 10*3/uL (ref 0.0–0.1)
Basophils Absolute: 0 10*3/uL (ref 0.0–0.1)
Basophils Relative: 0 %
EOS ABS: 0 10*3/uL (ref 0.0–0.7)
Eosinophils Relative: 1 %
HEMATOCRIT: 20 % — AB (ref 39.0–52.0)
HEMOGLOBIN: 6 g/dL — AB (ref 13.0–17.0)
IMMATURE GRANULOCYTES: 0 %
LYMPHS ABS: 1 10*3/uL (ref 0.7–4.0)
LYMPHS PCT: 24 %
MCH: 31.3 pg (ref 26.0–34.0)
MCHC: 30 g/dL (ref 30.0–36.0)
MCV: 104.2 fL — ABNORMAL HIGH (ref 78.0–100.0)
MONOS PCT: 11 %
Monocytes Absolute: 0.4 10*3/uL (ref 0.1–1.0)
NEUTROS PCT: 64 %
Neutro Abs: 2.6 10*3/uL (ref 1.7–7.7)
Platelets: 270 10*3/uL (ref 150–400)
RBC: 1.92 MIL/uL — ABNORMAL LOW (ref 4.22–5.81)
RDW: 16.4 % — AB (ref 11.5–15.5)
WBC: 4 10*3/uL (ref 4.0–10.5)

## 2017-09-07 LAB — COMPREHENSIVE METABOLIC PANEL
ALK PHOS: 63 U/L (ref 38–126)
ALT: 11 U/L (ref 0–44)
AST: 24 U/L (ref 15–41)
Albumin: 2.7 g/dL — ABNORMAL LOW (ref 3.5–5.0)
Anion gap: 9 (ref 5–15)
BILIRUBIN TOTAL: 0.7 mg/dL (ref 0.3–1.2)
BUN: 34 mg/dL — AB (ref 8–23)
CO2: 29 mmol/L (ref 22–32)
CREATININE: 4.58 mg/dL — AB (ref 0.61–1.24)
Calcium: 8.4 mg/dL — ABNORMAL LOW (ref 8.9–10.3)
Chloride: 99 mmol/L (ref 98–111)
GFR calc Af Amer: 12 mL/min — ABNORMAL LOW (ref >60)
GFR, EST NON AFRICAN AMERICAN: 11 mL/min — AB (ref >60)
GLUCOSE: 105 mg/dL — AB (ref 70–99)
Potassium: 4.1 mmol/L (ref 3.5–5.1)
Sodium: 137 mmol/L (ref 135–145)
Total Protein: 5.8 g/dL — ABNORMAL LOW (ref 6.5–8.1)

## 2017-09-07 LAB — PREPARE RBC (CROSSMATCH)

## 2017-09-07 MED ORDER — LATANOPROST 0.005 % OP SOLN
1.0000 [drp] | Freq: Every day | OPHTHALMIC | Status: DC
  Administered 2017-09-07: 1 [drp] via OPHTHALMIC
  Filled 2017-09-07: qty 2.5

## 2017-09-07 MED ORDER — CINACALCET HCL 30 MG PO TABS
60.0000 mg | ORAL_TABLET | Freq: Every day | ORAL | Status: DC
  Administered 2017-09-07: 60 mg via ORAL
  Filled 2017-09-07: qty 2

## 2017-09-07 MED ORDER — SODIUM CHLORIDE 0.9 % IV SOLN: 250.0000 mL | INTRAVENOUS | Status: DC | PRN

## 2017-09-07 MED ORDER — HEPARIN SOD (PORK) LOCK FLUSH 100 UNIT/ML IV SOLN
500.0000 [IU] | Freq: Every day | INTRAVENOUS | Status: DC | PRN
  Filled 2017-09-07: qty 5

## 2017-09-07 MED ORDER — SODIUM CHLORIDE 0.9% FLUSH: 3.0000 mL | INTRAVENOUS | Status: DC | PRN

## 2017-09-07 MED ORDER — ONDANSETRON HCL 4 MG PO TABS: 4.0000 mg | ORAL_TABLET | Freq: Four times a day (QID) | ORAL | Status: DC | PRN

## 2017-09-07 MED ORDER — SODIUM CHLORIDE 0.9% FLUSH
3.0000 mL | Freq: Two times a day (BID) | INTRAVENOUS | Status: DC
  Administered 2017-09-07 – 2017-09-08 (×2): 3 mL via INTRAVENOUS

## 2017-09-07 MED ORDER — ENZALUTAMIDE 40 MG PO CAPS: 160.0000 mg | ORAL_CAPSULE | Freq: Every day | ORAL | Status: DC

## 2017-09-07 MED ORDER — ONDANSETRON HCL 4 MG/2ML IJ SOLN: 4.0000 mg | Freq: Four times a day (QID) | INTRAMUSCULAR | Status: DC | PRN

## 2017-09-07 MED ORDER — RENA-VITE PO TABS
1.0000 | ORAL_TABLET | Freq: Every day | ORAL | Status: DC
  Administered 2017-09-08: 1 via ORAL
  Filled 2017-09-07: qty 1

## 2017-09-07 MED ORDER — AMLODIPINE BESYLATE 5 MG PO TABS
5.0000 mg | ORAL_TABLET | Freq: Every day | ORAL | Status: DC
  Administered 2017-09-08: 5 mg via ORAL
  Filled 2017-09-07: qty 1

## 2017-09-07 MED ORDER — CALCIUM ACETATE (PHOS BINDER) 667 MG PO CAPS
1334.0000 mg | ORAL_CAPSULE | Freq: Three times a day (TID) | ORAL | Status: DC
  Administered 2017-09-08 (×2): 1334 mg via ORAL
  Filled 2017-09-07 (×2): qty 2

## 2017-09-07 MED ORDER — SENNOSIDES-DOCUSATE SODIUM 8.6-50 MG PO TABS: 1.0000 | ORAL_TABLET | Freq: Every evening | ORAL | Status: DC | PRN

## 2017-09-07 MED ORDER — SODIUM CHLORIDE 0.9 % IV SOLN
250.0000 mL | Freq: Once | INTRAVENOUS | Status: AC
  Administered 2017-09-07: 250 mL via INTRAVENOUS

## 2017-09-07 MED ORDER — SODIUM CHLORIDE 0.9% FLUSH: 10.0000 mL | INTRAVENOUS | Status: DC | PRN

## 2017-09-07 MED ORDER — HEPARIN SOD (PORK) LOCK FLUSH 100 UNIT/ML IV SOLN
250.0000 [IU] | INTRAVENOUS | Status: DC | PRN
  Filled 2017-09-07: qty 2.5

## 2017-09-07 MED ORDER — ACETAMINOPHEN 325 MG PO TABS: 650.0000 mg | ORAL_TABLET | Freq: Four times a day (QID) | ORAL | Status: DC | PRN

## 2017-09-07 MED ORDER — CALCIUM ACETATE (PHOS BINDER) 667 MG PO CAPS: 667.0000 mg | ORAL_CAPSULE | Freq: Three times a day (TID) | ORAL | Status: DC | PRN

## 2017-09-07 MED ORDER — SODIUM CHLORIDE 0.9% IV SOLUTION
Freq: Once | INTRAVENOUS | Status: AC
  Administered 2017-09-07: 22:00:00 via INTRAVENOUS

## 2017-09-07 MED ORDER — PANTOPRAZOLE SODIUM 40 MG PO TBEC
40.0000 mg | DELAYED_RELEASE_TABLET | Freq: Every day | ORAL | Status: DC
  Administered 2017-09-08: 40 mg via ORAL
  Filled 2017-09-07: qty 1

## 2017-09-07 MED ORDER — ACETAMINOPHEN 650 MG RE SUPP: 650.0000 mg | Freq: Four times a day (QID) | RECTAL | Status: DC | PRN

## 2017-09-07 MED ORDER — ATORVASTATIN CALCIUM 20 MG PO TABS: 20.0000 mg | ORAL_TABLET | Freq: Every day | ORAL | Status: DC

## 2017-09-07 NOTE — H&P (Signed)
History and Physical    Cannon Arreola EFE:[id number] DOB: 1930-02-01 DOA: 09/07/2017  PCP: [name], MD   Patient coming from: Home   Chief Complaint: Fatigue, low Hgb on outpatient labs  HPI: [name] [name] is a 84 y.o. male with medical history significant for prostate cancer, coronary artery disease, and end-stage renal disease on hemodialysis, now presenting to the emergency department with fatigue and low hemoglobin on outpatient blood work.  Patient has required transfusions recently and had a normal EGD in July.  He reports brisk bleeding from his dialysis access after returning from dialysis on 08/30/2017 that was eventually controlled with pressure.  He denies any hematemesis, melena, or hematochezia since then and has been dialyzed multiple times since without any complication.  He reports increased fatigue, but denies chest pain or headaches.  Reports some mild DOE, but no cough or fever.  ED Course: Upon arrival to the ED, patient is found to be afebrile, saturating well on room air, and with vitals otherwise normal.  EKG features a sinus rhythm with diffuse T wave abnormalities.  Chemistry panel is notable for normal potassium and bicarbonate, BUN of 34, and creatinine 4.58.  CBC features a macrocytic anemia with hemoglobin of 6.0, down from 6.4 the last couple months.  Patient remained hemodynamically stable, in no acute respiratory distress, and hospitalist were asked to admit for symptomatic anemia.  Review of Systems:  All other systems reviewed and apart from HPI, are negative.  Past Medical History:  Diagnosis Date  . Anemia of chronic disease   . Chest pain   . Coronary artery disease   . Dialysis patient ([place])    T, TH, Sat  . Dyslipidemia   . ESRD (end stage renal disease) on dialysis [facility])    "[place]; T, Thurs, Sat" (12/26/2012)  . Gout   . Heart murmur   . Hypertension   . Metastatic cancer to bone ([place]) dx'd 04/04/17  . MYOCARDIAL INFARCTION, ACUTE,  NON-Q WAVE 04/26/2009   Qualifier: Diagnosis of  By: Percival Spanish, MD, Farrel Gordon    . Obesity   . Prostate CA ([place]) dx'd 12/2014   initial dx  . Prostate cancer ([place]) dx'd 04/04/17   recurrence  . Shortness of breath    "comes up at anytime" (12/26/2012)  . Type II diabetes mellitus ([place])    "diet controlled" (12/26/2012)  . Volume overload     Past Surgical History:  Procedure Laterality Date  . ARTERIOVENOUS GRAFT PLACEMENT Right 12/2008   "upper arm"  . AV FISTULA PLACEMENT    . BIOPSY  07/09/2017   Procedure: BIOPSY;  Surgeon: Wonda Horner, MD;  Location[facility] ENDOSCOPY;  Service: Endoscopy;;  . CARDIAC CATHETERIZATION  12/30/2012  . CORONARY ARTERY BYPASS GRAFT N/A 12/31/2012   Procedure: CORONARY ARTERY BYPASS GRAFTING times four using left internal mammary and right saphenous vein;  Surgeon: Grace Isaac, MD;  Location: [place];  Service: Open Heart Surgery;  Laterality: N/A;  . ESOPHAGOGASTRODUODENOSCOPY (EGD) WITH PROPOFOL N/A 07/09/2017   Procedure: ESOPHAGOGASTRODUODENOSCOPY (EGD) WITH PROPOFOL;  Surgeon: Wonda Horner, MD;  Location[facility] ENDOSCOPY;  Service: Endoscopy;  Laterality: N/A;  . INTRAOPERATIVE TRANSESOPHAGEAL ECHOCARDIOGRAM N/A 12/31/2012   Procedure: INTRAOPERATIVE TRANSESOPHAGEAL ECHOCARDIOGRAM;  Surgeon: Grace Isaac, MD;  Location: [place];  Service: Open Heart Surgery;  Laterality: N/A;  . LEFT HEART CATHETERIZATION WITH CORONARY ANGIOGRAM N/A 12/30/2012   Procedure: LEFT HEART CATHETERIZATION WITH CORONARY ANGIOGRAM;  Surgeon: Burnell Blanks, MD;  Location: [facility] CATH  LAB;  Service: Cardiovascular;  Laterality: N/A;     reports that he quit smoking about 22 years ago. His smoking use included cigarettes. He has a 40.00 pack-year smoking history. He has never used smokeless tobacco. He reports that he does not drink alcohol or use drugs.  No Known Allergies  Family History  Problem Relation Age of Onset  . Stroke Mother   . Diabetes Sister       Prior to Admission medications   Medication Sig Start Date End Date Taking? Authorizing Provider  amLODipine (NORVASC) 5 MG tablet Take 1 tablet (5 mg total) by mouth daily. 05/07/17  Yes Almyra Deforest, PA  aspirin EC 81 MG tablet Take 1 tablet (81 mg total) by mouth daily. Patient taking differently: Take 81 mg by mouth every evening.  05/07/17  Yes Almyra Deforest, PA  atorvastatin (LIPITOR) 20 MG tablet Take 1 tablet (20 mg total) by mouth daily. 05/07/17 05/02/18 Yes Almyra Deforest, PA  B Complex-C-Folic Acid (DIALYVITE 431) 0.8 MG TABS Take 1 tablet by mouth daily. 08/07/17  Yes [provider]  calcium acetate (PHOSLO) 667 MG capsule Take 1-2 capsules (667-1,334 mg total) by mouth See admin instructions. Take 2 capsules (1334 mg) with meals and 1 capsule (667 mg) with snacks or a sandwich 08/16/14  Yes Memon, Jolaine Artist, MD  cinacalcet (SENSIPAR) 60 MG tablet Take 60 mg by mouth at bedtime.    Yes [provider]  latanoprost (XALATAN) 0.005 % ophthalmic solution Place 1 drop into both eyes at bedtime.   Yes [provider]  leuprolide (LUPRON) 30 MG injection Inject 30 mg into the muscle every 4 (four) months.   Yes [provider]  pantoprazole (PROTONIX) 40 MG tablet Take 1 tablet (40 mg total) by mouth daily. 07/10/17  Yes Gherghe, Vella Redhead, MD  XTANDI 40 MG capsule TAKE 4 CAPSULES (160 MG TOTAL) BY MOUTH DAILY. Patient taking differently: Take 160 mg by mouth daily.  08/28/17  Yes Wyatt Portela, MD    Physical Exam: Vitals:   09/07/17 1548 09/07/17 1830 09/07/17 1845 09/07/17 1930  BP: (!) 143/44 (!) 145/58 (!) 150/59 (!) 156/54  Pulse: 76 68 65 68  Resp: 16 12 15 17   Temp: 97.8 F (36.6 C)     TempSrc: Oral     SpO2: 98% 100% 100% 100%  Weight:      Height:          Constitutional: NAD, calm  Eyes: PERTLA, lids and conjunctivae normal ENMT: Mucous membranes are moist. Posterior pharynx clear of any exudate or lesions.   Neck: normal, supple, no  masses, no thyromegaly Respiratory: clear to auscultation bilaterally, no wheezing, no crackles. Normal respiratory effort.    Cardiovascular: S1 & S2 heard, regular rate and rhythm. No extremity edema.  Abdomen: No distension, no tenderness, soft. Bowel sounds active.  Musculoskeletal: no clubbing / cyanosis. No joint deformity upper and lower extremities.    Skin: no significant rashes, lesions, ulcers. Warm, dry, well-perfused. Neurologic: No facial asymmetry. Sensation intact. Strength 5/5 in all 4 limbs.  Psychiatric: Alert and oriented x 3. Calm, cooperative.     Labs on Admission: I have personally reviewed following labs and imaging studies  CBC: Recent Labs  Lab 09/07/17 1612  WBC 4.0  NEUTROABS 2.6  HGB 6.0*  HCT 20.0*  MCV 104.2*  PLT 540   Basic Metabolic Panel: Recent Labs  Lab 09/07/17 1612  NA 137  K 4.1  CL 99  CO2  29  GLUCOSE 105*  BUN 34*  CREATININE 4.58*  CALCIUM 8.4*   GFR: Estimated Creatinine Clearance: 12.9 mL/min (A) (by C-G formula based on SCr of 4.58 mg/dL (H)). Liver Function Tests: Recent Labs  Lab 09/07/17 1612  AST 24  ALT 11  ALKPHOS 63  BILITOT 0.7  PROT 5.8*  ALBUMIN 2.7*   No results for input(s): LIPASE, AMYLASE in the last 168 hours. No results for input(s): AMMONIA in the last 168 hours. Coagulation Profile: No results for input(s): INR, PROTIME in the last 168 hours. Cardiac Enzymes: No results for input(s): CKTOTAL, CKMB, CKMBINDEX, TROPONINI in the last 168 hours. BNP (last 3 results) No results for input(s): PROBNP in the last 8760 hours. HbA1C: No results for input(s): HGBA1C in the last 72 hours. CBG: No results for input(s): GLUCAP in the last 168 hours. Lipid Profile: No results for input(s): CHOL, HDL, LDLCALC, TRIG, CHOLHDL, LDLDIRECT in the last 72 hours. Thyroid Function Tests: No results for input(s): TSH, T4TOTAL, FREET4, T3FREE, THYROIDAB in the last 72 hours. Anemia Panel: No results for  input(s): VITAMINB12, FOLATE, FERRITIN, TIBC, IRON, RETICCTPCT in the last 72 hours. Urine analysis: No results found for: COLORURINE, APPEARANCEUR, LABSPEC, PHURINE, GLUCOSEU, HGBUR, BILIRUBINUR, KETONESUR, PROTEINUR, UROBILINOGEN, NITRITE, LEUKOCYTESUR Sepsis Labs: @LABRCNTIP (procalcitonin:4,lacticidven:4) )No results found for this or any previous visit (from the past 240 hour(s)).   Radiological Exams on Admission: No results found.  EKG: Independently reviewed. Sinus rhythm, diffuse T-wave abnormalities.   Assessment/Plan   1. Symptomatic anemia  - Presents with fatigue since an episode of bleeding from his dialysis access on 08/30/17  - He is found to have Hgb of 6.0  - There is no active bleeding  - He has required transfusions recently, prior to the episode of bleeding from dialysis access, and had normal EGD in July 2019  - Transfuse 1 unit, check post-transfusion CBC, continue hematology/oncology follow-up    2. ESRD  - Reports completing HD on 8/29 without incident  - No indication for urgent HD   3. CAD - No anginal complaints  - Continue ASA, Lipitor    4. Prostate cancer  - Continue Xtandi     DVT prophylaxis: SCD's  Code Status: DNR  Family Communication: Discussed with patient  Consults called: None Admission status: Observation     Vianne Bulls, MD [facility] Pager [phone]  If 7PM-7AM, please contact night-coverage [email or web address] Password [facility]  09/07/2017, 8:27 PM

## 2017-09-07 NOTE — ED Provider Notes (Signed)
Patient placed in Quick Look pathway, seen and evaluated   Chief Complaint: abnormal lab  HPI:   [name] [name] is a 84 y.o. male who presents to the ED for f/u of abnormal labs. Patient went to dialysis yesterday and today they called and told him his Hgb was low, 6. Patient reports being tired and giving out while walking. Patient had blood transfusion the first of the month due to anemia.  ROS: General: fatigue  Resp: shortness of breath with exertion  Physical Exam:  BP (!) 143/44 (BP Location: Left Arm)   Pulse 76   Temp 97.8 F (36.6 C) (Oral)   Resp 16   Ht 6' (1.829 m)   Wt 93 kg   SpO2 98%   BMI 27.80 kg/m    Gen: No distress  Neuro: Awake and Alert  Skin: Warm and dry  Lungs: clear  Heart: regular rate and rhythm   Initiation of care has begun. The patient has been counseled on the process, plan, and necessity for staying for the completion/evaluation, and the remainder of the medical screening examination    Ashley Murrain, NP 09/07/17 Holden, Kevin, MD 09/08/17 [phone]

## 2017-09-07 NOTE — ED Notes (Signed)
Pt reports getting routine blood draw yesterday during his dialysis.  Received a call today instructing him to come to the ED d/t low Hgb.  He denies lightheadedness, dizziness, or weakness.  He reports generalized body aches and SOB only with exertion.  He is A&O x 4.  In NAD.

## 2017-09-07 NOTE — Progress Notes (Signed)
Pt alert and oriented x4. Skin warm and dry. Denies of any pain, weakness, SOB. PT informed about blood transfusion, pt verbalized understanding of transfusion. Pt signed consents, witnessed by this nurse. Denies allergies.

## 2017-09-07 NOTE — ED Provider Notes (Signed)
[facility] Emergency Department Provider Note MRN:  [id number]  Arrival date & time: 09/07/17     Chief Complaint   Abnormal Lab   History of Present Illness   [name] [name] is a 84 y.o. year-old male with a history of ESRD, CAD presenting to the ED with chief complaint of anemia.  Patient explains that he went to dialysis on Wednesday, afterwards had some bleeding from his AV fistula site.  It was taped and he went home.  He tried to take the tape off later that night, and when he did he bled profusely, blood shooting across the room.  Patient estimates that he lost at least a pint of blood.  His wife was able to re-tape it and apply ice, which stopped the bleeding.  Since that time patient has been experiencing increased shortness of breath, fatigue with only mild exertion.  Trouble walking around the home.  Labs performed at PCP reveal critically low hemoglobin.  Sent here for evaluation.  Denies any other bleeding sources, currently without complaints while resting.  Review of Systems  A complete 10 system review of systems was obtained and all systems are negative except as noted in the HPI and PMH.   Patient's Health History    Past Medical History:  Diagnosis Date  . Anemia of chronic disease   . Chest pain   . Coronary artery disease   . Dialysis patient ([place])    T, TH, Sat  . Dyslipidemia   . ESRD (end stage renal disease) on dialysis [facility])    "Industrial; T, Thurs, Sat" (12/26/2012)  . Gout   . Heart murmur   . Hypertension   . Metastatic cancer to bone ([place]) dx'd 04/04/17  . MYOCARDIAL INFARCTION, ACUTE, NON-Q WAVE 04/26/2009   Qualifier: Diagnosis of  By: Percival Spanish, MD, Farrel Gordon    . Obesity   . Prostate CA ([place]) dx'd 12/2014   initial dx  . Prostate cancer ([place]) dx'd 04/04/17   recurrence  . Shortness of breath    "comes up at anytime" (12/26/2012)  . Type II diabetes mellitus ([place])    "diet controlled" (12/26/2012)  . Volume overload       Past Surgical History:  Procedure Laterality Date  . ARTERIOVENOUS GRAFT PLACEMENT Right 12/2008   "upper arm"  . AV FISTULA PLACEMENT    . BIOPSY  07/09/2017   Procedure: BIOPSY;  Surgeon: Wonda Horner, MD;  Location[facility] ENDOSCOPY;  Service: Endoscopy;;  . CARDIAC CATHETERIZATION  12/30/2012  . CORONARY ARTERY BYPASS GRAFT N/A 12/31/2012   Procedure: CORONARY ARTERY BYPASS GRAFTING times four using left internal mammary and right saphenous vein;  Surgeon: Grace Isaac, MD;  Location: [place];  Service: Open Heart Surgery;  Laterality: N/A;  . ESOPHAGOGASTRODUODENOSCOPY (EGD) WITH PROPOFOL N/A 07/09/2017   Procedure: ESOPHAGOGASTRODUODENOSCOPY (EGD) WITH PROPOFOL;  Surgeon: Wonda Horner, MD;  Location: [facility] ENDOSCOPY;  Service: Endoscopy;  Laterality: N/A;  . INTRAOPERATIVE TRANSESOPHAGEAL ECHOCARDIOGRAM N/A 12/31/2012   Procedure: INTRAOPERATIVE TRANSESOPHAGEAL ECHOCARDIOGRAM;  Surgeon: Grace Isaac, MD;  Location: [place];  Service: Open Heart Surgery;  Laterality: N/A;  . LEFT HEART CATHETERIZATION WITH CORONARY ANGIOGRAM N/A 12/30/2012   Procedure: LEFT HEART CATHETERIZATION WITH CORONARY ANGIOGRAM;  Surgeon: Burnell Blanks, MD;  Location: [facility] CATH LAB;  Service: Cardiovascular;  Laterality: N/A;    Family History  Problem Relation Age of Onset  . Stroke Mother   . Diabetes Sister     Social History  Socioeconomic History  . Marital status: Married    Spouse name: Not on file  . Number of children: 2  . Years of education: Not on file  . Highest education level: Not on file  Occupational History  . 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 Financial resource strain: Not on file  . Food insecurity:    Worry: Not on file    Inability: Not on file  . Transportation needs:    Medical: Not on file    Non-medical: Not on file  Tobacco Use  . Smoking status: Former Smoker    Packs/day: 1.00    Years: 40.00    Pack years: 40.00    Types:  Cigarettes    Last attempt to quit: 06/16/1995    Years since quitting: 22.2  . Smokeless tobacco: Never Used  Substance and Sexual Activity  . Alcohol use: No    Alcohol/week: 0.0 standard drinks    Comment: 12/26/2012 "none in 30 yr; drank some when I was young"  . Drug use: No  . Sexual activity: Not Currently  Lifestyle  . Physical activity:    Days per week: Not on file    Minutes per session: Not on file  . Stress: Not on file  Relationships  . Social connections:    Talks on phone: Not on file    Gets together: Not on file    Attends religious service: Not on file    Active member of club or organization: Not on file    Attends meetings of clubs or organizations: Not on file    Relationship status: Not on file  . Intimate partner violence:    Fear of current or ex partner: Not on file    Emotionally abused: Not on file    Physically abused: Not on file    Forced sexual activity: Not on file  Other Topics Concern  . Not on file  Social History Narrative   He is a retired Administrator. He lives in [place] with his wife. He has two sons     Physical Exam  Vital Signs and Nursing Notes reviewed Vitals:   09/07/17 2039 09/07/17 2139  BP: (!) 154/120 (!) 157/54  Pulse: 73 68  Resp: 17 17  Temp: 98 F (36.7 C) 97.9 F (36.6 C)  SpO2: 100% 100%    CONSTITUTIONAL: Well-appearing, NAD NEURO:  Alert and oriented x 3, no focal deficits EYES:  eyes equal and reactive ENT/NECK:  no LAD, no JVD CARDIO: Regular rate, well-perfused, normal S1 and S2 PULM:  CTAB no wheezing or rhonchi GI/GU:  normal bowel sounds, non-distended, non-tender MSK/SPINE:  No gross deformities, no edema SKIN:  no rash, atraumatic, hemostatic right AV fistula with a strong thrill PSYCH:  Appropriate speech and behavior  Diagnostic and Interventional Summary    EKG Interpretation  Date/Time:    Ventricular Rate:    PR Interval:    QRS Duration:   QT Interval:    QTC Calculation:   R  Axis:     Text Interpretation:        Labs Reviewed  CBC WITH DIFFERENTIAL/PLATELET - Abnormal; Notable for the following components:      Result Value   RBC 1.92 (*)    Hemoglobin 6.0 (*)    HCT 20.0 (*)    MCV 104.2 (*)    RDW 16.4 (*)    All other components within normal limits  COMPREHENSIVE METABOLIC PANEL -  Abnormal; Notable for the following components:   Glucose, Bld 105 (*)    BUN 34 (*)    Creatinine, Ser 4.58 (*)    Calcium 8.4 (*)    Total Protein 5.8 (*)    Albumin 2.7 (*)    GFR calc non Af Amer 11 (*)    GFR calc Af Amer 12 (*)    All other components within normal limits  BASIC METABOLIC PANEL  TYPE AND SCREEN  PREPARE RBC (CROSSMATCH)    No orders to display    Medications  enzalutamide (XTANDI) capsule 160 mg (has no administration in time range)  amLODipine (NORVASC) tablet 5 mg (has no administration in time range)  atorvastatin (LIPITOR) tablet 20 mg (has no administration in time range)  cinacalcet (SENSIPAR) tablet 60 mg (has no administration in time range)  calcium acetate (PHOSLO) capsule 1,334 mg (has no administration in time range)  pantoprazole (PROTONIX) EC tablet 40 mg (has no administration in time range)  multivitamin (RENA-VIT) tablet 1 tablet (has no administration in time range)  latanoprost (XALATAN) 0.005 % ophthalmic solution 1 drop (has no administration in time range)  0.9 %  sodium chloride infusion (Manually program via Guardrails IV Fluids) (has no administration in time range)  sodium chloride flush (NS) 0.9 % injection 3 mL (has no administration in time range)  sodium chloride flush (NS) 0.9 % injection 3 mL (has no administration in time range)  0.9 %  sodium chloride infusion (has no administration in time range)  acetaminophen (TYLENOL) tablet 650 mg (has no administration in time range)    Or  acetaminophen (TYLENOL) suppository 650 mg (has no administration in time range)  ondansetron (ZOFRAN) tablet 4 mg (has no  administration in time range)    Or  ondansetron (ZOFRAN) injection 4 mg (has no administration in time range)  senna-docusate (Senokot-S) tablet 1 tablet (has no administration in time range)  calcium acetate (PHOSLO) capsule 667 mg (has no administration in time range)  heparin lock flush 100 unit/mL (has no administration in time range)  sodium chloride flush (NS) 0.9 % injection 10 mL (has no administration in time range)  heparin lock flush 100 unit/mL (has no administration in time range)  heparin lock flush 100 unit/mL (has no administration in time range)  sodium chloride flush (NS) 0.9 % injection 10 mL (has no administration in time range)  sodium chloride flush (NS) 0.9 % injection 3 mL (has no administration in time range)  0.9 %  sodium chloride infusion (has no administration in time range)     Procedures Critical Care Critical Care Documentation Critical care time provided by me (excluding procedures): 32 minutes  Condition necessitating critical care: Acute blood loss anemia requiring transfusion  Components of critical care management: reviewing of prior records, laboratory and imaging interpretation, frequent re-examination and reassessment of vital signs, discussion with consulting services to facilitate admission and blood transfusion.    ED Course and Medical Decision Making  I have reviewed the triage vital signs and the nursing notes.  Pertinent labs & imaging results that were available during my care of the patient were reviewed by me and considered in my medical decision making (see below for details).    Recent significant bleeding from AV fistula in this 84 year old male, history of ESRD, here with hemoglobin of 6.0, admitted to hospitalist service for blood transfusion.  Currently asymptomatic when at rest.  Barth Kirks. Sedonia Small, [place] [email or web address]  Final Clinical Impressions(s) / ED Diagnoses       ICD-10-CM   1. Acute blood loss anemia D62   2. Symptomatic anemia D64.9 heparin lock flush 100 unit/mL    sodium chloride flush (NS) 0.9 % injection 10 mL    heparin lock flush 100 unit/mL    heparin lock flush 100 unit/mL    sodium chloride flush (NS) 0.9 % injection 10 mL    sodium chloride flush (NS) 0.9 % injection 3 mL    0.9 %  sodium chloride infusion    ED Discharge Orders    None         Maudie Flakes, MD 09/07/17 2146

## 2017-09-07 NOTE — ED Triage Notes (Signed)
Pt states that he went to dialysis last Thursday and after his treatment his graft was bleeding a lot "I bet I lost a pint", was able to get it stopped, got treatment all of his normal dialysis yesterday and had blood drawn, they called and told him to come to the ED for a hgb "6. Something".

## 2017-09-08 DIAGNOSIS — N186 End stage renal disease: Secondary | ICD-10-CM

## 2017-09-08 DIAGNOSIS — D649 Anemia, unspecified: Secondary | ICD-10-CM

## 2017-09-08 LAB — CBC
HCT: 23.6 % — ABNORMAL LOW (ref 39.0–52.0)
HEMATOCRIT: 21.8 % — AB (ref 39.0–52.0)
Hemoglobin: 6.8 g/dL — CL (ref 13.0–17.0)
Hemoglobin: 7.4 g/dL — ABNORMAL LOW (ref 13.0–17.0)
MCH: 31.1 pg (ref 26.0–34.0)
MCH: 30.6 pg (ref 26.0–34.0)
MCHC: 31.2 g/dL (ref 30.0–36.0)
MCHC: 31.4 g/dL (ref 30.0–36.0)
MCV: 99.5 fL (ref 78.0–100.0)
MCV: 97.5 fL (ref 78.0–100.0)
PLATELETS: 228 10*3/uL (ref 150–400)
Platelets: 246 10*3/uL (ref 150–400)
RBC: 2.19 MIL/uL — ABNORMAL LOW (ref 4.22–5.81)
RBC: 2.42 MIL/uL — ABNORMAL LOW (ref 4.22–5.81)
RDW: 16.7 % — AB (ref 11.5–15.5)
RDW: 17.3 % — AB (ref 11.5–15.5)
WBC: 4.5 10*3/uL (ref 4.0–10.5)
WBC: 4.2 10*3/uL (ref 4.0–10.5)

## 2017-09-08 LAB — PREPARE RBC (CROSSMATCH)

## 2017-09-08 LAB — BASIC METABOLIC PANEL
ANION GAP: 8 (ref 5–15)
BUN: 40 mg/dL — ABNORMAL HIGH (ref 8–23)
CALCIUM: 8.2 mg/dL — AB (ref 8.9–10.3)
CHLORIDE: 100 mmol/L (ref 98–111)
CO2: 30 mmol/L (ref 22–32)
Creatinine, Ser: 5.3 mg/dL — ABNORMAL HIGH (ref 0.61–1.24)
GFR calc non Af Amer: 9 mL/min — ABNORMAL LOW (ref >60)
GFR, EST AFRICAN AMERICAN: 10 mL/min — AB (ref >60)
Glucose, Bld: 87 mg/dL (ref 70–99)
POTASSIUM: 4.1 mmol/L (ref 3.5–5.1)
Sodium: 138 mmol/L (ref 135–145)

## 2017-09-08 NOTE — Discharge Summary (Addendum)
Physician Discharge Summary  Keneth Borg UEA:[id number] DOB: 1930-10-10 DOA: 09/07/2017  PCP: [name], MD  Admit date: 09/07/2017 Discharge date: 09/08/2017  Time spent: 65 minutes  Recommendations for Outpatient Follow-up:  1. Follow up with PCP 1 week for evaluation of anemia and need for transfusion-- if has dark stools needs to follow up with GI. 2. Dialysis day of discharge on regular day (second shift)   Discharge Diagnoses:  Principal Problem:   Symptomatic anemia Active Problems:   CAD (coronary artery disease) of artery bypass graft   End-stage renal disease (ESRD) ([place])   Anemia of chronic kidney failure   Prostate CA [facility])   Discharge Condition: stable  Diet recommendation: heart healthy renal  Filed Weights   09/07/17 1547 09/07/17 2039 09/08/17 0444  Weight: 93 kg 92.5 kg 87.6 kg    History of present illness:  [name] [name] is a 84 y.o. male with medical history significant for prostate cancer, coronary artery disease, and end-stage renal disease on hemodialysis, presented to the emergency department with fatigue and low hemoglobin on outpatient blood work.  Patient has required transfusions recently. EGD in July revealed ulcers but no active bleeding.  He reported brisk bleeding from his dialysis access after returning from dialysis on 08/30/2017 that was eventually controlled with pressure.  He denied any hematemesis, melena, or hematochezia since then and has been dialyzed multiple times since without any complication.  He reported increased fatigue, but denied chest pain or headaches.  Reported some mild DOE, but no cough or fever.  Hospital Course:  1. Symptomatic anemia  -resolved at discharge-- walking the hallways - Presented with fatigue since an episode of bleeding from his dialysis access on 08/30/17. He is found to have Hgb of 6.0 but no active bleeding. ROBT negative. He was transfused with 1 unit PRBC's. Hg. 7.4 at discharge which appears close  to baseline. He is asymptomatic.     2. ESRD  - Tuesday Thursday Saturday dialysis schedule. Due for dialysis on day of discharge. Appreciate nephrology assistance with arranging for second shift dialysis appointment at patient's regular facility.    3. CAD - No anginal complaints EKG as noted.     4. Prostate cancer  - Continue Xtandi    Procedures:  Transfusion 1 unit packed RBC's  Consultations:  Dr patel nephrology  Discharge Exam: Vitals:   09/08/17 1031 09/08/17 1032  BP: (!) 140/46   Pulse: 62 77  Resp: 18   Temp: 98.3 F (36.8 C)   SpO2: 100% 100%    General: well nourished sitting on side of bed in no acute distress Cardiovascular: RRR no mgr no LE edema Respiratory: normal effort BS clear bilaterally. No wheeze.   Discharge Instructions   Discharge Instructions    Discharge instructions   Complete by:  As directed    Get dialysis today on second shift Will need close following of your Hgb and possibly more transfusions Please schedule appointment with your GI doctor ASAP if you  have dark stools Renal diet   Increase activity slowly   Complete by:  As directed      Allergies as of 09/08/2017   No Known Allergies     Medication List    TAKE these medications   amLODipine 5 MG tablet Commonly known as:  NORVASC Take 1 tablet (5 mg total) by mouth daily.   aspirin EC 81 MG tablet Take 1 tablet (81 mg total) by mouth daily. What changed:  when to take  this   atorvastatin 20 MG tablet Commonly known as:  LIPITOR Take 1 tablet (20 mg total) by mouth daily.   calcium acetate 667 MG capsule Commonly known as:  PHOSLO Take 1-2 capsules (667-1,334 mg total) by mouth See admin instructions. Take 2 capsules (1334 mg) with meals and 1 capsule (667 mg) with snacks or a sandwich   cinacalcet 60 MG tablet Commonly known as:  SENSIPAR Take 60 mg by mouth at bedtime.   DIALYVITE 800 0.8 MG Tabs Take 1 tablet by mouth daily.   latanoprost 0.005 %  ophthalmic solution Commonly known as:  XALATAN Place 1 drop into both eyes at bedtime.   leuprolide 30 MG injection Commonly known as:  LUPRON Inject 30 mg into the muscle every 4 (four) months.   pantoprazole 40 MG tablet Commonly known as:  PROTONIX Take 1 tablet (40 mg total) by mouth daily.   XTANDI 40 MG capsule Generic drug:  enzalutamide TAKE 4 CAPSULES (160 MG TOTAL) BY MOUTH DAILY. What changed:  See the new instructions.      No Known Allergies Follow-up Information    [name], MD Follow up in 1 week(s).   Specialty:  Family Medicine Contact information: [place] [address] [phone]        Minus Breeding, MD .   Specialty:  Cardiology Contact information: [address] [place] [address] [phone]        go to your HD Clinic as soon as you are discharged Follow up.            The results of significant diagnostics from this hospitalization (including imaging, microbiology, ancillary and laboratory) are listed below for reference.    Significant Diagnostic Studies: No results found.  Microbiology: No results found for this or any previous visit (from the past 240 hour(s)).   Labs: Basic Metabolic Panel: Recent Labs  Lab 09/07/17 1612 09/08/17 0320  NA 137 138  K 4.1 4.1  CL 99 100  CO2 29 30  GLUCOSE 105* 87  BUN 34* 40*  CREATININE 4.58* 5.30*  CALCIUM 8.4* 8.2*   Liver Function Tests: Recent Labs  Lab 09/07/17 1612  AST 24  ALT 11  ALKPHOS 63  BILITOT 0.7  PROT 5.8*  ALBUMIN 2.7*   No results for input(s): LIPASE, AMYLASE in the last 168 hours. No results for input(s): AMMONIA in the last 168 hours. CBC: Recent Labs  Lab 09/07/17 1612 09/08/17 0320 09/08/17 1000  WBC 4.0 4.5 4.2  NEUTROABS 2.6  --   --   HGB 6.0* 6.8* 7.4*  HCT 20.0* 21.8* 23.6*  MCV 104.2* 99.5 97.5  PLT 270 246 228   Cardiac Enzymes: No results for input(s): CKTOTAL, CKMB,  CKMBINDEX, TROPONINI in the last 168 hours. BNP: BNP (last 3 results) No results for input(s): BNP in the last 8760 hours.  ProBNP (last 3 results) No results for input(s): PROBNP in the last 8760 hours.  CBG: No results for input(s): GLUCAP in the last 168 hours.     Signed:  Eulogio Bear DO [facility] 09/08/2017, 12:21 PM

## 2017-09-08 NOTE — Progress Notes (Signed)
Patient verbalizes understanding of discharge instructions. Opportunity for questioning and answers were provided. Armband removed by staff, pt discharged from [facility] with spouse. Pt left in NAD.

## 2017-09-08 NOTE — Plan of Care (Signed)
Pt understands reason for receiving blood transfusion. Denies fatigue. Pt stable.

## 2017-09-09 LAB — TYPE AND SCREEN
ABO/RH(D): O POS
ANTIBODY SCREEN: NEGATIVE
UNIT DIVISION: 0
Unit division: 0

## 2017-09-09 LAB — BPAM RBC
Blood Product Expiration Date: 201909072359
Blood Product Expiration Date: 201909262359
ISSUE DATE / TIME: 201908302133
ISSUE DATE / TIME: 201908310526
UNIT TYPE AND RH: 5100
Unit Type and Rh: 5100

## 2017-09-19 ENCOUNTER — Inpatient Hospital Stay: Payer: Medicare Other | Attending: Oncology | Admitting: Oncology

## 2017-09-19 ENCOUNTER — Inpatient Hospital Stay: Payer: Medicare Other

## 2017-09-19 ENCOUNTER — Other Ambulatory Visit: Payer: Self-pay | Admitting: Medical Oncology

## 2017-09-19 ENCOUNTER — Telehealth: Payer: Self-pay | Admitting: Oncology

## 2017-09-19 VITALS — BP 126/51 | HR 99 | Temp 97.7°F | Resp 18 | Ht 72.0 in | Wt 190.8 lb

## 2017-09-19 DIAGNOSIS — C61 Malignant neoplasm of prostate: Secondary | ICD-10-CM

## 2017-09-19 DIAGNOSIS — C7951 Secondary malignant neoplasm of bone: Secondary | ICD-10-CM

## 2017-09-19 DIAGNOSIS — D649 Anemia, unspecified: Secondary | ICD-10-CM

## 2017-09-19 DIAGNOSIS — Z992 Dependence on renal dialysis: Secondary | ICD-10-CM | POA: Diagnosis not present

## 2017-09-19 DIAGNOSIS — N19 Unspecified kidney failure: Secondary | ICD-10-CM

## 2017-09-19 LAB — CBC WITH DIFFERENTIAL (CANCER CENTER ONLY)
BASOS ABS: 0 10*3/uL (ref 0.0–0.1)
Basophils Relative: 1 %
Eosinophils Absolute: 0 10*3/uL (ref 0.0–0.5)
Eosinophils Relative: 0 %
HCT: 20.5 % — ABNORMAL LOW (ref 38.4–49.9)
Hemoglobin: 6.6 g/dL — CL (ref 13.0–17.1)
LYMPHS ABS: 0.5 10*3/uL — AB (ref 0.9–3.3)
LYMPHS PCT: 14 %
MCH: 29.5 pg (ref 27.2–33.4)
MCHC: 32 g/dL (ref 32.0–36.0)
MCV: 92.2 fL (ref 79.3–98.0)
Monocytes Absolute: 0.4 10*3/uL (ref 0.1–0.9)
Monocytes Relative: 11 %
Neutro Abs: 2.6 10*3/uL (ref 1.5–6.5)
Neutrophils Relative %: 74 %
Platelet Count: 266 10*3/uL (ref 140–400)
RBC: 2.22 MIL/uL — AB (ref 4.20–5.82)
RDW: 18 % — ABNORMAL HIGH (ref 11.0–14.6)
WBC: 3.5 10*3/uL — AB (ref 4.0–10.3)

## 2017-09-19 LAB — SAMPLE TO BLOOD BANK

## 2017-09-19 LAB — CMP (CANCER CENTER ONLY)
ALT: 17 U/L (ref 0–44)
AST: 59 U/L — AB (ref 15–41)
Albumin: 2.2 g/dL — ABNORMAL LOW (ref 3.5–5.0)
Alkaline Phosphatase: 69 U/L (ref 38–126)
Anion gap: 13 (ref 5–15)
BUN: 37 mg/dL — ABNORMAL HIGH (ref 8–23)
CHLORIDE: 99 mmol/L (ref 98–111)
CO2: 29 mmol/L (ref 22–32)
Calcium: 8.4 mg/dL — ABNORMAL LOW (ref 8.9–10.3)
Creatinine: 4.96 mg/dL (ref 0.61–1.24)
GFR, EST AFRICAN AMERICAN: 11 mL/min — AB (ref >60)
GFR, EST NON AFRICAN AMERICAN: 10 mL/min — AB (ref >60)
Glucose, Bld: 87 mg/dL (ref 70–99)
POTASSIUM: 3.7 mmol/L (ref 3.5–5.1)
Sodium: 141 mmol/L (ref 135–145)
TOTAL PROTEIN: 6.4 g/dL — AB (ref 6.5–8.1)
Total Bilirubin: 0.5 mg/dL (ref 0.3–1.2)

## 2017-09-19 LAB — IRON AND TIBC
IRON: 20 ug/dL — AB (ref 42–163)
SATURATION RATIOS: 9 % — AB (ref 42–163)
TIBC: 213 ug/dL (ref 202–409)
UIBC: 193 ug/dL

## 2017-09-19 LAB — FERRITIN: Ferritin: 725 ng/mL — ABNORMAL HIGH (ref 24–336)

## 2017-09-19 LAB — PREPARE RBC (CROSSMATCH)

## 2017-09-19 LAB — VITAMIN B12: VITAMIN B 12: 1193 pg/mL — AB (ref 180–914)

## 2017-09-19 NOTE — Progress Notes (Signed)
Hematology and Oncology Follow Up Visit  [name] [name] [id number] Feb 27, 1930 83 y.o. 09/19/2017 9:34 AM [name], MDElkins, [name], *   Principle Diagnosis: 84 year old man with:  1. Prostate cancer diagnosed and 2017 with a Gleason score 4+4 = 8 PSA of 369.  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   2.  Multifactorial anemia: His anemia is related to renal disease as well as chronic blood losses with recurrent fecal occult testing that is positive.  Prior Therapy: Androgen deprivation therapy and he remains on it under the care of Dr. Diona Fanti. His PSA decreased to 31 in 2017. His PSA in November 2017 was 41 and in March 2018 was 29.    Current therapy:   Xtandi 160 mg daily started in March 2019.  Interim History: [name] [name] is here for a follow-up with his wife.  Since last visit, he was hospitalized briefly in the end of August for symptomatic anemia and worsening hemoglobin and requires 2 units of packed red cells.  After his discharge, he reports feeling reasonably fair without any issues.  He does report some mild fatigue and tiredness but no chest pain or difficulty breathing.  He continues to tolerate Xtandi reasonably well without any other concerns.  He denies any pathological fractures or bone pain.  He does not report any headaches, blurry vision, syncope or seizures.  He denies any lethargy or dizziness.  He denied any fevers, chills, sweats or weight loss. He does not report any chest pain, palpitation, orthopnea or leg edema. He does not report any cough, wheezing or hemoptysis. He does not report any nausea, vomiting or abdominal pain. He does not report any change in his bowel habits.  He does not report any ecchymosis or rash. He denies any bone pain or pathological fractures.  He denies any lymphadenopathy or petechiae.  Does not report any edges in his mood.  He denies any rashes or lesions.  Remaining review of system is  negative.    Medications: I have reviewed the patient's current medications.  Current Outpatient Medications  Medication Sig Dispense Refill  . amLODipine (NORVASC) 5 MG tablet Take 1 tablet (5 mg total) by mouth daily. 90 tablet 3  . aspirin EC 81 MG tablet Take 1 tablet (81 mg total) by mouth daily. (Patient taking differently: Take 81 mg by mouth every evening. ) 90 tablet 3  . atorvastatin (LIPITOR) 20 MG tablet Take 1 tablet (20 mg total) by mouth daily. 90 tablet 3  . B Complex-C-Folic Acid (DIALYVITE 235) 0.8 MG TABS Take 1 tablet by mouth daily.  3  . calcium acetate (PHOSLO) 667 MG capsule Take 1-2 capsules (667-1,334 mg total) by mouth See admin instructions. Take 2 capsules (1334 mg) with meals and 1 capsule (667 mg) with snacks or a sandwich 180 capsule 1  . cinacalcet (SENSIPAR) 60 MG tablet Take 60 mg by mouth at bedtime.     Marland Kitchen latanoprost (XALATAN) 0.005 % ophthalmic solution Place 1 drop into both eyes at bedtime.    Marland Kitchen leuprolide (LUPRON) 30 MG injection Inject 30 mg into the muscle every 4 (four) months.    . pantoprazole (PROTONIX) 40 MG tablet Take 1 tablet (40 mg total) by mouth daily. 30 tablet 1  . XTANDI 40 MG capsule TAKE 4 CAPSULES (160 MG TOTAL) BY MOUTH DAILY. (Patient taking differently: Take 160 mg by mouth daily. ) 120 capsule 0   No current facility-administered medications for this visit.  Allergies: No Known Allergies  Past Medical History, Surgical history, Social history, and Family History were reviewed and updated.  Physical Exam:  Blood pressure (!) 126/51, pulse 99, temperature 97.7 F (36.5 C), temperature source Oral, resp. rate 18, height 6' (1.829 m), weight 190 lb 12.8 oz (86.5 kg), SpO2 100 %.    ECOG: 1   General appearance: Comfortable appearing without any discomfort Head: Normocephalic without any trauma Oropharynx: Mucous membranes are moist and pink without any thrush or ulcers. Eyes: Pupils are equal and round reactive to  light. Lymph nodes: No cervical, supraclavicular, inguinal or axillary lymphadenopathy.   Heart:regular rate and rhythm.  S1 and S2 without leg edema. Lung: Clear without any rhonchi or wheezes.  No dullness to percussion. Abdomin: Soft, nontender, nondistended with good bowel sounds.  No hepatosplenomegaly. Musculoskeletal: No joint deformity or effusion.  Full range of motion noted. Neurological: No deficits noted on motor, sensory and deep tendon reflex exam. Skin: No petechial rash or dryness.  Appeared moist.     Lab Results: Lab Results  Component Value Date   WBC 4.2 09/08/2017   HGB 7.4 (L) 09/08/2017   HCT 23.6 (L) 09/08/2017   MCV 97.5 09/08/2017   PLT 228 09/08/2017     Chemistry      Component Value Date/Time   NA 138 09/08/2017 0320   NA 143 09/29/2016 0810   K 4.1 09/08/2017 0320   K 4.3 09/29/2016 0810   CL 100 09/08/2017 0320   CO2 30 09/08/2017 0320   CO2 31 (H) 09/29/2016 0810   BUN 40 (H) 09/08/2017 0320   BUN 27.9 (H) 09/29/2016 0810   CREATININE 5.30 (H) 09/08/2017 0320   CREATININE 5.19 (HH) 07/18/2017 1005   CREATININE 7.4 (HH) 09/29/2016 0810      Component Value Date/Time   CALCIUM 8.2 (L) 09/08/2017 0320   CALCIUM 9.9 09/29/2016 0810   ALKPHOS 63 09/07/2017 1612   ALKPHOS 141 09/29/2016 0810   AST 24 09/07/2017 1612   AST 21 07/18/2017 1005   AST 21 09/29/2016 0810   ALT 11 09/07/2017 1612   ALT 12 07/18/2017 1005   ALT 10 09/29/2016 0810   BILITOT 0.7 09/07/2017 1612   BILITOT 0.8 07/18/2017 1005   BILITOT 0.48 09/29/2016 0810       Results for CADON, RACZKA (MRN [id number]) as of 09/19/2017 09:35  Ref. Range 03/28/2017 08:32 05/23/2017 10:07 07/18/2017 10:05  Prostate Specific Ag, Serum Latest Ref Range: 0.0 - 4.0 ng/mL 63.5 (H) 7.7 (H) 1.6       Impression and Plan:   84 year old gentleman with the following issues:  1.  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   He continues to tolerate Xtandi reasonably well without any complications.  His PSA showed excellent response and currently down to 1.6.  Risks and benefits of continuing this therapy was reviewed today and is agreeable to continue.  2. Androgen deprivation therapy: None benefits of continuing Lupron long-term was reviewed and I recommended continuing this indefinitely.  Is currently receiving it under the care of Dr. Diona Fanti.  3. Bone directed therapy: Recommended continuing Xgeva long-term which he is leaving the care of Dr. Diona Fanti.  4.  Renal failure: He remains on hemodialysis Tuesday Thursday and Saturday.  5.  Anemia: Appears to be multifactorial in nature with element of GI bleeding, chronically insufficiency as well as chronic disease.  He had an endoscopy back in July and has been evaluated by gastroenterology and felt that he does not need a colonoscopy.  His hemoglobin continues to drop despite multiple transfusion.  Iron studies back in June 2019 appeared adequate.  Other etiologies to be considered will be myelodysplastic syndrome.  For the time being I will continue with supportive transfusion and will consider bone marrow biopsy if he continues to require constant transfusion support.  He will have a transfusion appointment in the near future.  6.  Prognosis and goals of care: Incurable malignancy but disease that is been palliated adequately with the current medication.  I recommend aggressive therapy for the time being.  7. Follow-up: In his progress.  25  minutes was spent with the patient face-to-face today.  More than 50% of time was dedicated to doing imaging studies, discussing the natural course of his disease and coordinating his multifaceted complicated future plan of care.  Zola Button, MD 9/11/20199:34 AM

## 2017-09-19 NOTE — Telephone Encounter (Signed)
Added patient to infusion log for add on Blood per 9/11 sch message

## 2017-09-19 NOTE — Addendum Note (Signed)
Addended by: Wyatt Portela on: 09/19/2017 12:48 PM   Modules accepted: Orders

## 2017-09-20 ENCOUNTER — Telehealth: Payer: Self-pay | Admitting: *Deleted

## 2017-09-20 LAB — MULTIPLE MYELOMA PANEL, SERUM
ALBUMIN SERPL ELPH-MCNC: 2.4 g/dL — AB (ref 2.9–4.4)
ALBUMIN/GLOB SERPL: 0.8 (ref 0.7–1.7)
ALPHA 1: 0.5 g/dL — AB (ref 0.0–0.4)
ALPHA2 GLOB SERPL ELPH-MCNC: 0.9 g/dL (ref 0.4–1.0)
B-Globulin SerPl Elph-Mcnc: 0.8 g/dL (ref 0.7–1.3)
GAMMA GLOB SERPL ELPH-MCNC: 0.9 g/dL (ref 0.4–1.8)
GLOBULIN, TOTAL: 3.1 g/dL (ref 2.2–3.9)
IGA: 250 mg/dL (ref 61–437)
IgG (Immunoglobin G), Serum: 893 mg/dL (ref 700–1600)
IgM (Immunoglobulin M), Srm: 46 mg/dL (ref 15–143)
Total Protein ELP: 5.5 g/dL — ABNORMAL LOW (ref 6.0–8.5)

## 2017-09-20 LAB — PROSTATE-SPECIFIC AG, SERUM (LABCORP): PROSTATE SPECIFIC AG, SERUM: 1.4 ng/mL (ref 0.0–4.0)

## 2017-09-20 NOTE — Telephone Encounter (Signed)
-----   Message from Wyatt Portela, MD sent at 09/20/2017  8:19 AM EDT ----- Please let him know his PSA is down.

## 2017-09-20 NOTE — Telephone Encounter (Signed)
Spoke with wife, gave results of last PSA

## 2017-09-21 ENCOUNTER — Inpatient Hospital Stay: Payer: Medicare Other

## 2017-09-22 ENCOUNTER — Encounter: Payer: Self-pay | Admitting: Cardiology

## 2017-09-22 ENCOUNTER — Inpatient Hospital Stay: Payer: Medicare Other

## 2017-09-22 DIAGNOSIS — D649 Anemia, unspecified: Secondary | ICD-10-CM

## 2017-09-22 DIAGNOSIS — C61 Malignant neoplasm of prostate: Secondary | ICD-10-CM | POA: Diagnosis not present

## 2017-09-22 MED ORDER — DIPHENHYDRAMINE HCL 25 MG PO CAPS
25.0000 mg | ORAL_CAPSULE | Freq: Once | ORAL | Status: AC
  Administered 2017-09-22: 25 mg via ORAL

## 2017-09-22 MED ORDER — DIPHENHYDRAMINE HCL 25 MG PO CAPS
ORAL_CAPSULE | ORAL | Status: AC
  Filled 2017-09-22: qty 1

## 2017-09-22 MED ORDER — SODIUM CHLORIDE 0.9% IV SOLUTION
250.0000 mL | Freq: Once | INTRAVENOUS | Status: AC
  Administered 2017-09-22: 250 mL via INTRAVENOUS
  Filled 2017-09-22: qty 250

## 2017-09-22 MED ORDER — ACETAMINOPHEN 325 MG PO TABS
ORAL_TABLET | ORAL | Status: AC
  Filled 2017-09-22: qty 2

## 2017-09-22 MED ORDER — ACETAMINOPHEN 325 MG PO TABS
650.0000 mg | ORAL_TABLET | Freq: Once | ORAL | Status: AC
  Administered 2017-09-22: 650 mg via ORAL

## 2017-09-22 NOTE — Patient Instructions (Signed)

## 2017-09-22 NOTE — Progress Notes (Signed)
Cardiology Office Note   Date:  09/24/2017   ID:  [name] [name], DOB [birth year], MRN [id number]  PCP:  [name], MD  Cardiologist:   Minus Breeding, MD   Chief Complaint  Patient presents with  . Coronary Artery Disease      History of Present Illness: [name] [name] is a 84 y.o. male who presents for follow up of CAD s/p CABG (LIMA to LAD, SVG to intermediate, reverse SVG to OM, reverse SVG to posterior descending artery) 12/31/2012.  Prior to parathyroidectomy recently he had a [employer].  There is a small size, irreversible defect in the basal and mid inferior walls consistent with prior infarction, no ischemia is seen. LVEF is mildly decreased at 50%.   This was consistent with previous myocardial infarction.    Since he was last seen he is done well.  He walks with a cane.  He has had no cardiovascular problems.  However, he does have a chronic anemia.  He is getting as needed transfusions.  This is felt to be multifactorial and I note that on 9/11 his hemoglobin was 6.6.  He tolerates dialysis.  He denies any chest pressure, neck or arm discomfort.  Is not having any palpitations, presyncope or syncope.  No weight gain or edema.   Past Medical History:  Diagnosis Date  . Anemia of chronic disease   . Coronary artery disease   . Dialysis patient ([place])    T, TH, Sat  . Dyslipidemia   . ESRD (end stage renal disease) on dialysis [facility])    "[employer]; T, Thurs, Sat" (12/26/2012)  . Gout   . Hypertension   . Metastatic cancer to bone ([place]) dx'd 04/04/17  . MYOCARDIAL INFARCTION, ACUTE, NON-Q WAVE 04/26/2009   Qualifier: Diagnosis of  By: Percival Spanish, MD, Farrel Gordon    . Obesity   . Prostate CA ([place]) dx'd 12/2014   initial dx  . Type II diabetes mellitus ([place])    "diet controlled" (12/26/2012)    Past Surgical History:  Procedure Laterality Date  . ARTERIOVENOUS GRAFT PLACEMENT Right 12/2008   "upper arm"  . AV FISTULA PLACEMENT    . BIOPSY  07/09/2017   Procedure: BIOPSY;  Surgeon: Wonda Horner, MD;  Location: [facility] ENDOSCOPY;  Service: Endoscopy;;  . CARDIAC CATHETERIZATION  12/30/2012  . CORONARY ARTERY BYPASS GRAFT N/A 12/31/2012   Procedure: CORONARY ARTERY BYPASS GRAFTING times four using left internal mammary and right saphenous vein;  Surgeon: Grace Isaac, MD;  Location: [place];  Service: Open Heart Surgery;  Laterality: N/A;  . ESOPHAGOGASTRODUODENOSCOPY (EGD) WITH PROPOFOL N/A 07/09/2017   Procedure: ESOPHAGOGASTRODUODENOSCOPY (EGD) WITH PROPOFOL;  Surgeon: Wonda Horner, MD;  Location: [facility] ENDOSCOPY;  Service: Endoscopy;  Laterality: N/A;  . INTRAOPERATIVE TRANSESOPHAGEAL ECHOCARDIOGRAM N/A 12/31/2012   Procedure: INTRAOPERATIVE TRANSESOPHAGEAL ECHOCARDIOGRAM;  Surgeon: Grace Isaac, MD;  Location: [place];  Service: Open Heart Surgery;  Laterality: N/A;  . LEFT HEART CATHETERIZATION WITH CORONARY ANGIOGRAM N/A 12/30/2012   Procedure: LEFT HEART CATHETERIZATION WITH CORONARY ANGIOGRAM;  Surgeon: Burnell Blanks, MD;  Location: [facility] CATH LAB;  Service: Cardiovascular;  Laterality: N/A;     Current Outpatient Medications  Medication Sig Dispense Refill  . amLODipine (NORVASC) 5 MG tablet Take 1 tablet (5 mg total) by mouth daily. 90 tablet 3  . aspirin EC 81 MG tablet Take 1 tablet (81 mg total) by mouth daily. (Patient taking differently: Take 81 mg by mouth every evening. ) 90 tablet  3  . atorvastatin (LIPITOR) 20 MG tablet Take 1 tablet (20 mg total) by mouth daily. 90 tablet 3  . B Complex-C-Folic Acid (DIALYVITE 756) 0.8 MG TABS Take 1 tablet by mouth daily.  3  . cinacalcet (SENSIPAR) 60 MG tablet Take 60 mg by mouth at bedtime.     Marland Kitchen latanoprost (XALATAN) 0.005 % ophthalmic solution Place 1 drop into both eyes at bedtime.    Marland Kitchen leuprolide (LUPRON) 30 MG injection Inject 30 mg into the muscle every 4 (four) months.    . pantoprazole (PROTONIX) 40 MG tablet Take 1 tablet (40 mg total) by mouth daily. 30 tablet 1  .  XTANDI 40 MG capsule TAKE 4 CAPSULES (160 MG TOTAL) BY MOUTH DAILY. (Patient taking differently: Take 160 mg by mouth daily. ) 120 capsule 0  . calcium acetate (PHOSLO) 667 MG capsule Take 1-2 capsules (667-1,334 mg total) by mouth See admin instructions. Take 2 capsules (1334 mg) with meals and 1 capsule (667 mg) with snacks or a sandwich (Patient not taking: Reported on 09/24/2017) 180 capsule 1   No current facility-administered medications for this visit.     Allergies:   Patient has no known allergies.    ROS:  Please see the history of present illness.   Otherwise, review of systems are positive for none.   All other systems are reviewed and negative.    PHYSICAL EXAM: VS:  BP (!) 132/58   Pulse 78   Ht 6\' 1"  (1.854 m)   Wt 193 lb 6.4 oz (87.7 kg)   SpO2 99%   BMI 25.52 kg/m  , BMI Body mass index is 25.52 kg/m. GENERAL:  Well appearing NECK:  No jugular venous distention, waveform within normal limits, carotid upstroke brisk and symmetric, no bruits, no thyromegaly LUNGS:  Clear to auscultation bilaterally CHEST:  Well healed sternotomy scar. HEART:  PMI not displaced or sustained,S1 and S2 within normal limits, no S3, no S4, no clicks, no rubs, 3 of 6 apical systolic murmur radiating slightly at the aortic outflow tract, no diastolic murmurs ABD:  Flat, positive bowel sounds normal in frequency in pitch, no bruits, no rebound, no guarding, no midline pulsatile mass, no hepatomegaly, no splenomegaly EXT:  2 plus pulses upper and decreased DP/PT, no edema, no cyanosis no clubbing, right upper arm fistula   EKG:  EKG is not ordered today.    Recent Labs: 09/19/2017: ALT 17; BUN 37; Creatinine 4.96; Hemoglobin 6.6; Platelet Count 266; Potassium 3.7; Sodium 141    Wt Readings from Last 3 Encounters:  09/24/17 193 lb 6.4 oz (87.7 kg)  09/19/17 190 lb 12.8 oz (86.5 kg)  09/08/17 193 lb 2 oz (87.6 kg)      Other studies Reviewed: Additional studies/ records that were  reviewed today include:     Echo and Lexiscan Myoview.. Review of the above records demonstrates:  Please see elsewhere in the note.   ASSESSMENT AND PLAN:  CAD s/p CABG:   He had a perfusion study recently without ischemia.    No change in therapy or further studies are indicated.   AI:   This was mild and will be followed clinically.    End-stage renal disease on dialysis:   He tolerates this.    Followed by Dr. Florene Glen  Hypertension:   Norvasc was increased recently.  BP is at target.  No change in therapy.   Hyperlipidemia:   This was restarted at the last appt.  Follow up lipids are  indicated.  I will order today although he is not fasting as it is difficult for him to make it back here for labs.   Prediabetes: Recent hemoglobin A1c 4.0.     Current medicines are reviewed at length with the patient today.  The patient does not have concerns regarding medicines.  The following changes have been made:  no change  Labs/ tests ordered today include: as above  Orders Placed This Encounter  Procedures  . Lipid panel  . Hepatic function panel     Disposition:   FU with me in one year.     Signed, Minus Breeding, MD  09/24/2017 10:27 AM    [facility]

## 2017-09-23 LAB — TYPE AND SCREEN
ABO/RH(D): O POS
Antibody Screen: NEGATIVE
UNIT DIVISION: 0
Unit division: 0

## 2017-09-23 LAB — BPAM RBC
BLOOD PRODUCT EXPIRATION DATE: 201910092359
BLOOD PRODUCT EXPIRATION DATE: 201910092359
ISSUE DATE / TIME: 201909141038
ISSUE DATE / TIME: 201909141038
UNIT TYPE AND RH: 5100
UNIT TYPE AND RH: 5100

## 2017-09-24 ENCOUNTER — Ambulatory Visit: Payer: Medicare Other | Admitting: Cardiology

## 2017-09-24 ENCOUNTER — Encounter: Payer: Self-pay | Admitting: Cardiology

## 2017-09-24 VITALS — BP 132/58 | HR 78 | Ht 73.0 in | Wt 193.4 lb

## 2017-09-24 DIAGNOSIS — E785 Hyperlipidemia, unspecified: Secondary | ICD-10-CM

## 2017-09-24 DIAGNOSIS — I2510 Atherosclerotic heart disease of native coronary artery without angina pectoris: Secondary | ICD-10-CM | POA: Diagnosis not present

## 2017-09-24 LAB — LIPID PANEL
CHOLESTEROL TOTAL: 103 mg/dL (ref 100–199)
Chol/HDL Ratio: 2.5 ratio (ref 0.0–5.0)
HDL: 41 mg/dL (ref >39)
LDL CALC: 48 mg/dL (ref 0–99)
Triglycerides: 71 mg/dL (ref 0–149)
VLDL Cholesterol Cal: 14 mg/dL (ref 5–40)

## 2017-09-24 LAB — HEPATIC FUNCTION PANEL
ALBUMIN: 3.2 g/dL — AB (ref 3.5–4.7)
ALT: 17 IU/L (ref 0–44)
AST: 27 IU/L (ref 0–40)
Alkaline Phosphatase: 80 IU/L (ref 39–117)
BILIRUBIN TOTAL: 0.4 mg/dL (ref 0.0–1.2)
Bilirubin, Direct: 0.19 mg/dL (ref 0.00–0.40)
Total Protein: 5.9 g/dL — ABNORMAL LOW (ref 6.0–8.5)

## 2017-09-24 NOTE — Patient Instructions (Signed)
Medication Instructions:  Your physician recommends that you continue on your current medications as directed. Please refer to the Current Medication list given to you today.   Labwork: Lipid and Hepatic panel  Testing/Procedures: None ordered  Follow-Up: Your physician wants you to follow-up in: 1 year with Dr.Hochrein You will receive a reminder letter in the mail two months in advance. If you don't receive a letter, please call our office to schedule the follow-up appointment.   Any Other Special Instructions Will Be Listed Below (If Applicable).     If you need a refill on your cardiac medications before your next appointment, please call your pharmacy.

## 2017-09-28 ENCOUNTER — Inpatient Hospital Stay: Payer: Medicare Other

## 2017-09-28 VITALS — BP 129/42 | HR 63 | Temp 97.6°F | Resp 18

## 2017-09-28 DIAGNOSIS — D649 Anemia, unspecified: Secondary | ICD-10-CM

## 2017-09-28 DIAGNOSIS — C61 Malignant neoplasm of prostate: Secondary | ICD-10-CM | POA: Diagnosis not present

## 2017-09-28 MED ORDER — SODIUM CHLORIDE 0.9 % IV SOLN
Freq: Once | INTRAVENOUS | Status: AC
  Administered 2017-09-28: 15:00:00 via INTRAVENOUS
  Filled 2017-09-28: qty 250

## 2017-09-28 MED ORDER — SODIUM CHLORIDE 0.9 % IV SOLN
510.0000 mg | Freq: Once | INTRAVENOUS | Status: AC
  Administered 2017-09-28: 510 mg via INTRAVENOUS
  Filled 2017-09-28: qty 17

## 2017-09-28 NOTE — Patient Instructions (Signed)

## 2017-10-03 ENCOUNTER — Inpatient Hospital Stay: Payer: Medicare Other

## 2017-10-03 VITALS — BP 122/68 | HR 78 | Temp 98.7°F | Resp 18

## 2017-10-03 DIAGNOSIS — D649 Anemia, unspecified: Secondary | ICD-10-CM

## 2017-10-03 DIAGNOSIS — C61 Malignant neoplasm of prostate: Secondary | ICD-10-CM | POA: Diagnosis not present

## 2017-10-03 MED ORDER — SODIUM CHLORIDE 0.9 % IV SOLN
510.0000 mg | Freq: Once | INTRAVENOUS | Status: AC
  Administered 2017-10-03: 510 mg via INTRAVENOUS
  Filled 2017-10-03: qty 17

## 2017-10-03 MED ORDER — SODIUM CHLORIDE 0.9 % IV SOLN
Freq: Once | INTRAVENOUS | Status: AC
  Administered 2017-10-03: 15:00:00 via INTRAVENOUS
  Filled 2017-10-03: qty 250

## 2017-10-03 NOTE — Patient Instructions (Signed)

## 2017-10-04 ENCOUNTER — Other Ambulatory Visit: Payer: Self-pay | Admitting: Oncology

## 2017-10-04 DIAGNOSIS — C61 Malignant neoplasm of prostate: Secondary | ICD-10-CM

## 2017-10-05 ENCOUNTER — Inpatient Hospital Stay: Payer: Medicare Other

## 2017-10-05 ENCOUNTER — Telehealth: Payer: Self-pay | Admitting: Oncology

## 2017-10-05 VITALS — BP 130/46 | HR 66 | Temp 97.6°F | Resp 16

## 2017-10-05 DIAGNOSIS — D649 Anemia, unspecified: Secondary | ICD-10-CM

## 2017-10-05 DIAGNOSIS — C61 Malignant neoplasm of prostate: Secondary | ICD-10-CM | POA: Diagnosis not present

## 2017-10-05 LAB — CBC WITH DIFFERENTIAL (CANCER CENTER ONLY)
BASOS PCT: 1 %
Basophils Absolute: 0 10*3/uL (ref 0.0–0.1)
Eosinophils Absolute: 0 10*3/uL (ref 0.0–0.5)
Eosinophils Relative: 0 %
HEMATOCRIT: 24.5 % — AB (ref 38.4–49.9)
Hemoglobin: 7.9 g/dL — ABNORMAL LOW (ref 13.0–17.1)
Lymphocytes Relative: 14 %
Lymphs Abs: 0.6 10*3/uL — ABNORMAL LOW (ref 0.9–3.3)
MCH: 30.1 pg (ref 27.2–33.4)
MCHC: 32.3 g/dL (ref 32.0–36.0)
MCV: 93.2 fL (ref 79.3–98.0)
Monocytes Absolute: 0.7 10*3/uL (ref 0.1–0.9)
Monocytes Relative: 16 %
NEUTROS ABS: 3 10*3/uL (ref 1.5–6.5)
NEUTROS PCT: 69 %
Platelet Count: 288 10*3/uL (ref 140–400)
RBC: 2.63 MIL/uL — AB (ref 4.20–5.82)
RDW: 18.3 % — ABNORMAL HIGH (ref 11.0–14.6)
WBC: 4.4 10*3/uL (ref 4.0–10.3)

## 2017-10-05 LAB — SAMPLE TO BLOOD BANK

## 2017-10-05 LAB — PREPARE RBC (CROSSMATCH)

## 2017-10-05 MED ORDER — DIPHENHYDRAMINE HCL 25 MG PO CAPS
ORAL_CAPSULE | ORAL | Status: AC
  Filled 2017-10-05: qty 1

## 2017-10-05 MED ORDER — ACETAMINOPHEN 325 MG PO TABS
ORAL_TABLET | ORAL | Status: AC
  Filled 2017-10-05: qty 2

## 2017-10-05 MED ORDER — DIPHENHYDRAMINE HCL 25 MG PO CAPS
25.0000 mg | ORAL_CAPSULE | Freq: Once | ORAL | Status: AC
  Administered 2017-10-05: 25 mg via ORAL

## 2017-10-05 MED ORDER — ACETAMINOPHEN 325 MG PO TABS
650.0000 mg | ORAL_TABLET | Freq: Once | ORAL | Status: AC
  Administered 2017-10-05: 650 mg via ORAL

## 2017-10-05 MED ORDER — SODIUM CHLORIDE 0.9% IV SOLUTION
250.0000 mL | Freq: Once | INTRAVENOUS | Status: AC
  Administered 2017-10-05: 250 mL via INTRAVENOUS
  Filled 2017-10-05: qty 250

## 2017-10-05 NOTE — Patient Instructions (Signed)

## 2017-10-05 NOTE — Telephone Encounter (Signed)
Scheduled appt per 9/27 sch message - left message with appt date and time.

## 2017-10-06 ENCOUNTER — Inpatient Hospital Stay: Payer: Medicare Other

## 2017-10-08 LAB — BPAM RBC
BLOOD PRODUCT EXPIRATION DATE: 201910232359
Blood Product Expiration Date: 201910232359
ISSUE DATE / TIME: 201909271024
ISSUE DATE / TIME: 201909271229
Unit Type and Rh: 5100
Unit Type and Rh: 5100

## 2017-10-08 LAB — TYPE AND SCREEN
ABO/RH(D): O POS
Antibody Screen: NEGATIVE
Unit division: 0
Unit division: 0

## 2017-10-09 MED FILL — XTANDI 40 MG CAPSULE: 40 | 30 days supply | Qty: 120 | Fill #0

## 2017-10-31 ENCOUNTER — Inpatient Hospital Stay: Payer: Medicare Other

## 2017-10-31 ENCOUNTER — Inpatient Hospital Stay (HOSPITAL_BASED_OUTPATIENT_CLINIC_OR_DEPARTMENT_OTHER): Payer: Medicare Other | Admitting: Oncology

## 2017-10-31 ENCOUNTER — Inpatient Hospital Stay: Payer: Medicare Other | Attending: Oncology

## 2017-10-31 ENCOUNTER — Other Ambulatory Visit: Payer: Self-pay | Admitting: Oncology

## 2017-10-31 VITALS — BP 148/49 | HR 77 | Temp 97.8°F | Resp 17 | Ht 73.0 in | Wt 188.6 lb

## 2017-10-31 DIAGNOSIS — C7951 Secondary malignant neoplasm of bone: Secondary | ICD-10-CM

## 2017-10-31 DIAGNOSIS — Z992 Dependence on renal dialysis: Secondary | ICD-10-CM | POA: Insufficient documentation

## 2017-10-31 DIAGNOSIS — N19 Unspecified kidney failure: Secondary | ICD-10-CM | POA: Insufficient documentation

## 2017-10-31 DIAGNOSIS — E291 Testicular hypofunction: Secondary | ICD-10-CM

## 2017-10-31 DIAGNOSIS — Z79899 Other long term (current) drug therapy: Secondary | ICD-10-CM | POA: Insufficient documentation

## 2017-10-31 DIAGNOSIS — N289 Disorder of kidney and ureter, unspecified: Secondary | ICD-10-CM

## 2017-10-31 DIAGNOSIS — D509 Iron deficiency anemia, unspecified: Secondary | ICD-10-CM | POA: Insufficient documentation

## 2017-10-31 DIAGNOSIS — D638 Anemia in other chronic diseases classified elsewhere: Secondary | ICD-10-CM | POA: Insufficient documentation

## 2017-10-31 DIAGNOSIS — D649 Anemia, unspecified: Secondary | ICD-10-CM

## 2017-10-31 DIAGNOSIS — C61 Malignant neoplasm of prostate: Secondary | ICD-10-CM | POA: Diagnosis not present

## 2017-10-31 LAB — CBC WITH DIFFERENTIAL (CANCER CENTER ONLY)
Abs Immature Granulocytes: 0.01 10*3/uL (ref 0.00–0.07)
Basophils Absolute: 0 10*3/uL (ref 0.0–0.1)
Basophils Relative: 1 %
Eosinophils Absolute: 0.1 10*3/uL (ref 0.0–0.5)
Eosinophils Relative: 2 %
HCT: 32.4 % — ABNORMAL LOW (ref 39.0–52.0)
Hemoglobin: 10.2 g/dL — ABNORMAL LOW (ref 13.0–17.0)
Immature Granulocytes: 0 %
Lymphocytes Relative: 19 %
Lymphs Abs: 0.8 10*3/uL (ref 0.7–4.0)
MCH: 28.9 pg (ref 26.0–34.0)
MCHC: 31.5 g/dL (ref 30.0–36.0)
MCV: 91.8 fL (ref 80.0–100.0)
Monocytes Absolute: 0.6 10*3/uL (ref 0.1–1.0)
Monocytes Relative: 15 %
Neutro Abs: 2.8 10*3/uL (ref 1.7–7.7)
Neutrophils Relative %: 63 %
Platelet Count: 240 10*3/uL (ref 150–400)
RBC: 3.53 MIL/uL — ABNORMAL LOW (ref 4.22–5.81)
RDW: 18 % — ABNORMAL HIGH (ref 11.5–15.5)
WBC Count: 4.3 10*3/uL (ref 4.0–10.5)
nRBC: 0 % (ref 0.0–0.2)

## 2017-10-31 LAB — SAMPLE TO BLOOD BANK

## 2017-10-31 NOTE — Progress Notes (Signed)
Hematology and Oncology Follow Up Visit  [name] [name] [id number] Jan 18, 1930 84 y.o. 10/31/2017 9:27 AM [name], MDElkins, [name], *   Principle Diagnosis: 84 year old man with:  1.  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  He was initially diagnosed and 2017 with a Gleason score 4+4 = 8 PSA of 369.    2.  Anemia: Etiology related to iron deficiency and chronic blood losses in addition to renal insufficiency.  Prior Therapy: Androgen deprivation therapy and he remains on it under the care of Dr. Diona Fanti. His PSA decreased to 31 in 2017. His PSA in November 2017 was 41 and in March 2018 was 29.   Status post intravenous iron as well as recurrent back red cell transfusion. Current therapy:   Xtandi 160 mg daily started in March 2019.  Interim History: [name] [name] returns today for repeat evaluation.  Since the last visit, he received 2 units of packed red cells in September 2019.  He also received Feraheme infusion at that time.  He reports he felt well since that time with improvement in his overall performance and activity level.  His appetite has been reasonable and he maintained his weight.  He denies any bone pain or pathological fractures.  He continues to tolerate Xtandi reasonably well without any issues or deterioration.  His quality of life is unchanged.  He does not report any headaches, blurry vision, syncope or seizures.  He denies any alteration in mental status or confusion.  He denied any fevers, chills, sweats or weight loss. He does not report any chest pain, palpitation, orthopnea or leg edema. He does not report any cough, wheezing or hemoptysis. He does not report any nausea, vomiting or abdominal pain. He does not report any constipation or diarrhea.  He does not report any ecchymosis or rash. He denies any paralysis or myalgias.  He denies any lymphadenopathy or petechiae.  Does not report any anxiety or depression.  He  denies any easy bruising or bleeding.  Remaining review of system is negative.    Medications: I have reviewed the patient's current medications.  Current Outpatient Medications  Medication Sig Dispense Refill  . amLODipine (NORVASC) 5 MG tablet Take 1 tablet (5 mg total) by mouth daily. 90 tablet 3  . aspirin EC 81 MG tablet Take 1 tablet (81 mg total) by mouth daily. (Patient taking differently: Take 81 mg by mouth every evening. ) 90 tablet 3  . atorvastatin (LIPITOR) 20 MG tablet Take 1 tablet (20 mg total) by mouth daily. 90 tablet 3  . B Complex-C-Folic Acid (DIALYVITE 916) 0.8 MG TABS Take 1 tablet by mouth daily.  3  . calcium acetate (PHOSLO) 667 MG capsule Take 1-2 capsules (667-1,334 mg total) by mouth See admin instructions. Take 2 capsules (1334 mg) with meals and 1 capsule (667 mg) with snacks or a sandwich 180 capsule 1  . cinacalcet (SENSIPAR) 60 MG tablet Take 60 mg by mouth at bedtime.     Marland Kitchen latanoprost (XALATAN) 0.005 % ophthalmic solution Place 1 drop into both eyes at bedtime.    Marland Kitchen leuprolide (LUPRON) 30 MG injection Inject 30 mg into the muscle every 4 (four) months.    . pantoprazole (PROTONIX) 40 MG tablet Take 1 tablet (40 mg total) by mouth daily. 30 tablet 1  . XTANDI 40 MG capsule TAKE 4 CAPSULES (160 MG TOTAL) BY MOUTH DAILY. 120 capsule 0   No current facility-administered medications for this visit.  Allergies: No Known Allergies  Past Medical History, Surgical history, Social history, and Family History were reviewed and updated.  Physical Exam:  Blood pressure (!) 148/49, pulse 77, temperature 97.8 F (36.6 C), temperature source Oral, resp. rate 17, height 6\' 1"  (1.854 m), weight 188 lb 9.6 oz (85.5 kg), SpO2 99 %.    ECOG: 1   General appearance: Alert, awake without any distress. Head: Atraumatic without abnormalities Oropharynx: Without any thrush or ulcers. Eyes: No scleral icterus. Lymph nodes: No lymphadenopathy noted in the  cervical, supraclavicular, or axillary nodes Heart:regular rate and rhythm, without any murmurs or gallops.   Lung: Clear to auscultation without any rhonchi, wheezes or dullness to percussion. Abdomin: Soft, nontender without any shifting dullness or ascites. Musculoskeletal: No clubbing or cyanosis. Neurological: No motor or sensory deficits. Skin: No rashes or lesions. Psychiatric: Mood and affect appeared normal.     Lab Results: Lab Results  Component Value Date   WBC 4.3 10/31/2017   HGB 10.2 (L) 10/31/2017   HCT 32.4 (L) 10/31/2017   MCV 91.8 10/31/2017   PLT 240 10/31/2017     Chemistry      Component Value Date/Time   NA 141 09/19/2017 0915   NA 143 09/29/2016 0810   K 3.7 09/19/2017 0915   K 4.3 09/29/2016 0810   CL 99 09/19/2017 0915   CO2 29 09/19/2017 0915   CO2 31 (H) 09/29/2016 0810   BUN 37 (H) 09/19/2017 0915   BUN 27.9 (H) 09/29/2016 0810   CREATININE 4.96 (HH) 09/19/2017 0915   CREATININE 7.4 (HH) 09/29/2016 0810      Component Value Date/Time   CALCIUM 8.4 (L) 09/19/2017 0915   CALCIUM 9.9 09/29/2016 0810   ALKPHOS 80 09/24/2017 1004   ALKPHOS 141 09/29/2016 0810   AST 27 09/24/2017 1004   AST 59 (H) 09/19/2017 0915   AST 21 09/29/2016 0810   ALT 17 09/24/2017 1004   ALT 17 09/19/2017 0915   ALT 10 09/29/2016 0810   BILITOT 0.4 09/24/2017 1004   BILITOT 0.5 09/19/2017 0915   BILITOT 0.48 09/29/2016 0810         Results for [name] [name], [name] [name] (MRN [id number]) as of 10/31/2017 09:18  Ref. Range 05/23/2017 10:07 07/18/2017 10:05 09/19/2017 09:15  Prostate Specific Ag, Serum Latest Ref Range: 0.0 - 4.0 ng/mL 7.7 (H) 1.6 1.4      Impression and Plan:   84 year old gentleman with the following issues:  1.  Castration-resistant prostate cancer with disease to the bone and local advancement of his disease.  He is currently on Xtandi with excellent response to therapy currently PSA declining down to 1.4.  The natural course of his disease and  risks and benefits of continuing therapy was discussed today and is agreeable to continue.  Different salvage therapy may be needed in the future if his disease progresses.  2. Androgen deprivation therapy: He continues to receive Lupron at this time under the care of Dr. Diona Fanti.  I recommend continuing this indefinitely.   3.  Anemia: He has element of iron deficiency as well as chronic disease in addition to renal insufficiency.  His hemoglobin is adequate today and does not require any transfusion.  I recommended continue supportive management and transfuse as needed.  4.  Prognosis and goals of care: His disease is incurable and any treatment is palliative at this time.  I recommended continue active care given his reasonable performance status.  5.  Renal failure: He continues to receive dialysis  at this time.  6. Follow-up: 6 weeks to follow his progress.  25  minutes was spent with the patient face-to-face today.  More than 50% of time was dedicated to reviewing his disease status, treatment options and managing complications related to therapy.Zola Button, MD 10/23/20199:27 AM

## 2017-11-01 ENCOUNTER — Telehealth: Payer: Self-pay

## 2017-11-01 LAB — PROSTATE-SPECIFIC AG, SERUM (LABCORP): PROSTATE SPECIFIC AG, SERUM: 0.4 ng/mL (ref 0.0–4.0)

## 2017-11-01 NOTE — Telephone Encounter (Signed)
Contacted patient and left VM message with results and to call back if have any questions.

## 2017-11-01 NOTE — Telephone Encounter (Signed)
-----   Message from Wyatt Portela, MD sent at 11/01/2017  8:01 AM EDT ----- Please let him know his PSA is low

## 2017-11-12 MED FILL — XTANDI 40 MG CAPSULE: 40 | 30 days supply | Qty: 120 | Fill #0

## 2017-11-21 ENCOUNTER — Inpatient Hospital Stay: Payer: Medicare Other | Attending: Oncology

## 2017-11-21 ENCOUNTER — Inpatient Hospital Stay: Payer: Medicare Other

## 2017-11-21 DIAGNOSIS — C7951 Secondary malignant neoplasm of bone: Secondary | ICD-10-CM | POA: Insufficient documentation

## 2017-11-21 DIAGNOSIS — C61 Malignant neoplasm of prostate: Secondary | ICD-10-CM | POA: Insufficient documentation

## 2017-11-21 DIAGNOSIS — D649 Anemia, unspecified: Secondary | ICD-10-CM

## 2017-11-21 LAB — CBC WITH DIFFERENTIAL (CANCER CENTER ONLY)
ABS IMMATURE GRANULOCYTES: 0.02 10*3/uL (ref 0.00–0.07)
BASOS ABS: 0 10*3/uL (ref 0.0–0.1)
BASOS PCT: 1 %
EOS PCT: 1 %
Eosinophils Absolute: 0 10*3/uL (ref 0.0–0.5)
HCT: 31.6 % — ABNORMAL LOW (ref 39.0–52.0)
Hemoglobin: 9.6 g/dL — ABNORMAL LOW (ref 13.0–17.0)
IMMATURE GRANULOCYTES: 0 %
LYMPHS ABS: 0.9 10*3/uL (ref 0.7–4.0)
LYMPHS PCT: 19 %
MCH: 29.4 pg (ref 26.0–34.0)
MCHC: 30.4 g/dL (ref 30.0–36.0)
MCV: 96.9 fL (ref 80.0–100.0)
MONOS PCT: 11 %
Monocytes Absolute: 0.5 10*3/uL (ref 0.1–1.0)
NRBC: 0 % (ref 0.0–0.2)
Neutro Abs: 3.2 10*3/uL (ref 1.7–7.7)
Neutrophils Relative %: 68 %
PLATELETS: 313 10*3/uL (ref 150–400)
RBC: 3.26 MIL/uL — AB (ref 4.22–5.81)
RDW: 18.6 % — ABNORMAL HIGH (ref 11.5–15.5)
WBC Count: 4.7 10*3/uL (ref 4.0–10.5)

## 2017-11-21 LAB — SAMPLE TO BLOOD BANK

## 2017-11-30 ENCOUNTER — Other Ambulatory Visit: Payer: Self-pay | Admitting: Oncology

## 2017-11-30 DIAGNOSIS — C61 Malignant neoplasm of prostate: Secondary | ICD-10-CM

## 2017-12-10 MED FILL — XTANDI 40 MG CAPSULE: 40 | 30 days supply | Qty: 120 | Fill #0

## 2017-12-19 ENCOUNTER — Inpatient Hospital Stay: Payer: Medicare Other

## 2017-12-19 ENCOUNTER — Inpatient Hospital Stay: Payer: Medicare Other | Attending: Oncology

## 2017-12-19 ENCOUNTER — Telehealth: Payer: Self-pay

## 2017-12-19 ENCOUNTER — Inpatient Hospital Stay (HOSPITAL_BASED_OUTPATIENT_CLINIC_OR_DEPARTMENT_OTHER): Payer: Medicare Other | Admitting: Oncology

## 2017-12-19 VITALS — BP 118/43 | HR 75 | Temp 97.5°F | Resp 17 | Ht 73.0 in | Wt 181.6 lb

## 2017-12-19 DIAGNOSIS — D638 Anemia in other chronic diseases classified elsewhere: Secondary | ICD-10-CM

## 2017-12-19 DIAGNOSIS — C7951 Secondary malignant neoplasm of bone: Secondary | ICD-10-CM | POA: Insufficient documentation

## 2017-12-19 DIAGNOSIS — D649 Anemia, unspecified: Secondary | ICD-10-CM

## 2017-12-19 DIAGNOSIS — Z992 Dependence on renal dialysis: Secondary | ICD-10-CM | POA: Diagnosis not present

## 2017-12-19 DIAGNOSIS — D630 Anemia in neoplastic disease: Secondary | ICD-10-CM

## 2017-12-19 DIAGNOSIS — Z79899 Other long term (current) drug therapy: Secondary | ICD-10-CM | POA: Diagnosis not present

## 2017-12-19 DIAGNOSIS — N19 Unspecified kidney failure: Secondary | ICD-10-CM | POA: Insufficient documentation

## 2017-12-19 DIAGNOSIS — C61 Malignant neoplasm of prostate: Secondary | ICD-10-CM

## 2017-12-19 DIAGNOSIS — N289 Disorder of kidney and ureter, unspecified: Secondary | ICD-10-CM | POA: Insufficient documentation

## 2017-12-19 DIAGNOSIS — E291 Testicular hypofunction: Secondary | ICD-10-CM

## 2017-12-19 LAB — CMP (CANCER CENTER ONLY)
ALT: 6 U/L (ref 0–44)
AST: 16 U/L (ref 15–41)
Albumin: 2.7 g/dL — ABNORMAL LOW (ref 3.5–5.0)
Alkaline Phosphatase: 123 U/L (ref 38–126)
Anion gap: 13 (ref 5–15)
BILIRUBIN TOTAL: 0.8 mg/dL (ref 0.3–1.2)
BUN: 20 mg/dL (ref 8–23)
CALCIUM: 9 mg/dL (ref 8.9–10.3)
CO2: 27 mmol/L (ref 22–32)
Chloride: 101 mmol/L (ref 98–111)
Creatinine: 3.74 mg/dL (ref 0.61–1.24)
GFR, Est AFR Am: 16 mL/min — ABNORMAL LOW (ref >60)
GFR, Estimated: 14 mL/min — ABNORMAL LOW (ref >60)
Glucose, Bld: 95 mg/dL (ref 70–99)
Potassium: 4.3 mmol/L (ref 3.5–5.1)
Sodium: 141 mmol/L (ref 135–145)
TOTAL PROTEIN: 6.5 g/dL (ref 6.5–8.1)

## 2017-12-19 LAB — CBC WITH DIFFERENTIAL (CANCER CENTER ONLY)
Abs Immature Granulocytes: 0 10*3/uL (ref 0.00–0.07)
BASOS ABS: 0 10*3/uL (ref 0.0–0.1)
Basophils Relative: 1 %
EOS ABS: 0 10*3/uL (ref 0.0–0.5)
EOS PCT: 1 %
HCT: 30.2 % — ABNORMAL LOW (ref 39.0–52.0)
HEMOGLOBIN: 9.3 g/dL — AB (ref 13.0–17.0)
IMMATURE GRANULOCYTES: 0 %
Lymphocytes Relative: 25 %
Lymphs Abs: 0.9 10*3/uL (ref 0.7–4.0)
MCH: 28.6 pg (ref 26.0–34.0)
MCHC: 30.8 g/dL (ref 30.0–36.0)
MCV: 92.9 fL (ref 80.0–100.0)
MONO ABS: 0.4 10*3/uL (ref 0.1–1.0)
MONOS PCT: 12 %
NEUTROS PCT: 61 %
Neutro Abs: 2.3 10*3/uL (ref 1.7–7.7)
Platelet Count: 303 10*3/uL (ref 150–400)
RBC: 3.25 MIL/uL — ABNORMAL LOW (ref 4.22–5.81)
RDW: 17.3 % — ABNORMAL HIGH (ref 11.5–15.5)
WBC Count: 3.7 10*3/uL — ABNORMAL LOW (ref 4.0–10.5)
nRBC: 0 % (ref 0.0–0.2)

## 2017-12-19 LAB — SAMPLE TO BLOOD BANK

## 2017-12-19 NOTE — Telephone Encounter (Signed)
Printed avs and calender of upcoming appointment. Per 12/11 los 

## 2017-12-19 NOTE — Progress Notes (Signed)
Hematology and Oncology Follow Up Visit  [name] [name] [id number] 10-05-1930 84 y.o. 12/19/2017 9:57 AM [name] [name], MDElkins, [name] [name], *   Principle Diagnosis: 84 year old man with:  1.  Prostate cancer diagnosed in 2017 with a Gleason score 4+4 = 8 PSA of 369.  He developed castration-resistant disease with progressed in March 2019 with osseous metastasis and adenopathy.  2.  Anemia: Etiology related to iron deficiency and chronic blood losses in addition to renal insufficiency.  Prior Therapy: Androgen deprivation therapy and he remains on it under the care of Dr. Diona [name]. His PSA decreased to 31 in 2017. His PSA in November 2017 was 41 and in March 2018 was 29.   Status post intravenous iron as well as recurrent back red cell transfusion. Current therapy:   Xtandi 160 mg daily started in March 2019.  Interim History: [name] [name] is here for a follow-up visit.  Since the last visit, he reports no major complaints at this time.  He continues to tolerate Xtandi without any recent side effects.  He denies excessive fatigue, tiredness or lower extremity edema.  Denies any bone pain or recent hospitalization.  He has not required any transfusion in the last month.  Continues to tolerate dialysis without any issues.  He does not report any headaches, blurry vision, syncope or seizures.  He denies any dizziness or lethargy.  He denied any fevers, chills, sweats or weight loss. He does not report any chest pain, palpitation, orthopnea or leg edema. He does not report any cough, wheezing or hemoptysis. He does not report any nausea, vomiting or early satiety. He does not report any change in bowel habits.  He does not report any ecchymosis or rash. He denies any bone pain or pathological fractures.  He denies any bleeding or clotting tendency.  Does not report any changes in his mood.  He denies any current rashes or lesions.  Remaining review of system is negative.    Medications:  I have reviewed the patient's current medications.  Current Outpatient Medications  Medication Sig Dispense Refill  . amLODipine (NORVASC) 5 MG tablet Take 1 tablet (5 mg total) by mouth daily. 90 tablet 3  . aspirin EC 81 MG tablet Take 1 tablet (81 mg total) by mouth daily. (Patient taking differently: Take 81 mg by mouth every evening. ) 90 tablet 3  . atorvastatin (LIPITOR) 20 MG tablet Take 1 tablet (20 mg total) by mouth daily. 90 tablet 3  . B Complex-C-Folic Acid (DIALYVITE 654) 0.8 MG TABS Take 1 tablet by mouth daily.  3  . calcium acetate (PHOSLO) 667 MG capsule Take 1-2 capsules (667-1,334 mg total) by mouth See admin instructions. Take 2 capsules (1334 mg) with meals and 1 capsule (667 mg) with snacks or a sandwich 180 capsule 1  . cinacalcet (SENSIPAR) 60 MG tablet Take 60 mg by mouth at bedtime.     Marland Kitchen latanoprost (XALATAN) 0.005 % ophthalmic solution Place 1 drop into both eyes at bedtime.    Marland Kitchen leuprolide (LUPRON) 30 MG injection Inject 30 mg into the muscle every 4 (four) months.    . pantoprazole (PROTONIX) 40 MG tablet Take 1 tablet (40 mg total) by mouth daily. 30 tablet 1  . XTANDI 40 MG capsule TAKE 4 CAPSULES (160 MG TOTAL) BY MOUTH DAILY. 120 capsule 0   No current facility-administered medications for this visit.      Allergies: No Known Allergies  Past Medical History, Surgical history, Social history, and Family History  were reviewed and updated.  Physical Exam:  Blood pressure (!) 118/43, pulse 75, temperature (!) 97.5 F (36.4 C), temperature source Oral, resp. rate 17, height 6\' 1"  (1.854 m), weight 181 lb 9.6 oz (82.4 kg), SpO2 100 %.     ECOG: 1    General appearance: Comfortable appearing without any discomfort Head: Normocephalic without any trauma Oropharynx: Mucous membranes are moist and pink without any thrush or ulcers. Eyes: Pupils are equal and round reactive to light. Lymph nodes: No cervical, supraclavicular, inguinal or axillary  lymphadenopathy.   Heart:regular rate and rhythm.  S1 and S2 without leg edema. Lung: Clear without any rhonchi or wheezes.  No dullness to percussion. Abdomin: Soft, nontender, nondistended with good bowel sounds.  No hepatosplenomegaly. Musculoskeletal: No joint deformity or effusion.  Full range of motion noted. Neurological: No deficits noted on motor, sensory and deep tendon reflex exam. Skin: No petechial rash or dryness.  Appeared moist.       Lab Results: Lab Results  Component Value Date   WBC 4.7 11/21/2017   HGB 9.6 (L) 11/21/2017   HCT 31.6 (L) 11/21/2017   MCV 96.9 11/21/2017   PLT 313 11/21/2017     Chemistry      Component Value Date/Time   NA 141 09/19/2017 0915   NA 143 09/29/2016 0810   K 3.7 09/19/2017 0915   K 4.3 09/29/2016 0810   CL 99 09/19/2017 0915   CO2 29 09/19/2017 0915   CO2 31 (H) 09/29/2016 0810   BUN 37 (H) 09/19/2017 0915   BUN 27.9 (H) 09/29/2016 0810   CREATININE 4.96 (HH) 09/19/2017 0915   CREATININE 7.4 (HH) 09/29/2016 0810      Component Value Date/Time   CALCIUM 8.4 (L) 09/19/2017 0915   CALCIUM 9.9 09/29/2016 0810   ALKPHOS 80 09/24/2017 1004   ALKPHOS 141 09/29/2016 0810   AST 27 09/24/2017 1004   AST 59 (H) 09/19/2017 0915   AST 21 09/29/2016 0810   ALT 17 09/24/2017 1004   ALT 17 09/19/2017 0915   ALT 10 09/29/2016 0810   BILITOT 0.4 09/24/2017 1004   BILITOT 0.5 09/19/2017 0915   BILITOT 0.48 09/29/2016 0810       Results for [name] [name], [name] [name] (MRN [id number]) as of 12/19/2017 09:57  Ref. Range 07/18/2017 10:05 09/19/2017 09:15 10/31/2017 08:47  Prostate Specific Ag, Serum Latest Ref Range: 0.0 - 4.0 ng/mL 1.6 1.4 0.4         Impression and Plan:   84 year old man with:  1.  Advanced prostate cancer with disease to the bone and pelvic disease that is currently castration-resistant.   He remains on Xtandi which she has tolerated very well with excellent PSA response.  Risks and benefits of continuing this  therapy long-term was reviewed today.  Long-term complications include hypertension, fatigue, dysuria and worsening anemia was discussed.  He is agreeable to continue at this time.  2. Androgen deprivation therapy: I have recommended continuing this indefinitely.  He is receiving Lupron under the care of Dr. Diona [name]..   3.  Anemia: Related to malignancy, chronic disease and renal insufficiency.  He has received intravenous iron replacement in September 2019.  His hemoglobin is adequate today and does not require any transfusion.  We will check his iron periodically and replace as needed.  4.  Prognosis and goals of care: Therapy remains palliative although his performance status is adequate and aggressive therapy is warranted.  5.  Renal failure: He remains on dialysis without any  recent complications.  6. Follow-up: In 6 weeks to follow his progress and repeat laboratory testing.  25  minutes was spent with the patient face-to-face today.  More than 50% of time was dedicated to discussing his laboratory data, treatment options and coordinating future plan of care.   Zola Button, MD 12/11/20199:57 AM

## 2017-12-24 LAB — PROSTATE-SPECIFIC AG, SERUM (LABCORP): PROSTATE SPECIFIC AG, SERUM: 0.3 ng/mL (ref 0.0–4.0)

## 2017-12-31 ENCOUNTER — Other Ambulatory Visit: Payer: Self-pay | Admitting: Oncology

## 2017-12-31 DIAGNOSIS — C61 Malignant neoplasm of prostate: Secondary | ICD-10-CM

## 2018-01-10 MED FILL — XTANDI 40 MG CAPSULE: 40 | 30 days supply | Qty: 120 | Fill #0

## 2018-01-11 ENCOUNTER — Telehealth: Payer: Self-pay

## 2018-01-11 NOTE — Telephone Encounter (Signed)
Oral Oncology Patient Advocate Encounter  I was successful at securing a grant with [employer] for $7,500. This will keep the out of pocket expense for Xtandi at $0. The grant information is as follows and has been shared with [place].  Approval dates: 01/10/18-01/11/19 ID: [id number] Group: [id number] BIN: [id number] PCN: [id number]  I called the patient and spoke to his wife, Earlie Server. I gave her the good news and she verbalized understanding and great appreciation   Dora Patient [place] Phone [phone] Fax [phone]

## 2018-01-25 ENCOUNTER — Other Ambulatory Visit: Payer: Self-pay | Admitting: *Deleted

## 2018-01-25 ENCOUNTER — Other Ambulatory Visit: Payer: Self-pay

## 2018-01-25 ENCOUNTER — Encounter: Payer: Self-pay | Admitting: Vascular Surgery

## 2018-01-25 ENCOUNTER — Encounter: Payer: Self-pay | Admitting: *Deleted

## 2018-01-25 ENCOUNTER — Ambulatory Visit: Payer: Medicare Other | Admitting: Vascular Surgery

## 2018-01-25 VITALS — BP 132/59 | HR 79 | Temp 96.8°F | Resp 16 | Ht 72.0 in | Wt 180.0 lb

## 2018-01-25 DIAGNOSIS — Z992 Dependence on renal dialysis: Secondary | ICD-10-CM

## 2018-01-25 DIAGNOSIS — N186 End stage renal disease: Secondary | ICD-10-CM | POA: Diagnosis not present

## 2018-01-25 NOTE — Progress Notes (Signed)
Patient ID: [name] [name], male   DOB: [birth year], 85 y.o.   MRN: [id number]  Reason for Consult: New Patient (Initial Visit) (eval for aneurysm/scab on access)   Referred by [name] Melena, MD  Subjective:     HPI:  [name] [name] is a 85 y.o. male presents for evaluation of right upper arm pseudoaneurysm.  He has been on dialysis via this fistula for almost 10 years he says.  He will has been evaluated for pseudoaneurysm in the past was not treated here.  Has not had any bleeding issues.  States that they are able to stick this without much issue at this time.  Past Medical History:  Diagnosis Date  . Anemia of chronic disease   . Coronary artery disease   . Dialysis patient ([name] [name])    T, TH, [name]  . Dyslipidemia   . ESRD (end stage renal disease) on dialysis [name] [name])    "[name] [name]; [name] [name]" ([name] [name])  . Gout   . Hypertension   . Metastatic cancer to bone ([name] [name]) dx'd 04/04/17  . MYOCARDIAL INFARCTION, ACUTE, NON-Q WAVE 04/26/2009   Qualifier: Diagnosis of  By: [name] Spanish, MD, [name] [name]    . Obesity   . Prostate CA ([name] [name]) dx'd 12/2014   initial dx  . Type II diabetes mellitus ([name] [name])    "diet controlled" ([name] [name])   Family History  Problem Relation Age of Onset  . Stroke Mother   . Diabetes Sister    Past Surgical History:  Procedure Laterality Date  . ARTERIOVENOUS GRAFT PLACEMENT Right 12/2008   "upper arm"  . AV FISTULA PLACEMENT    . BIOPSY  07/09/2017   Procedure: BIOPSY;  Surgeon: [name] Horner, MD;  Location: [name];  Service: [name];;  . CARDIAC CATHETERIZATION  12/30/2012  . CORONARY ARTERY BYPASS GRAFT N/A 12/31/2012   Procedure: CORONARY ARTERY BYPASS GRAFTING times four using left internal mammary and right saphenous vein;  Surgeon: [name] Isaac, MD;  Location: [name] [name];  Service: Open Heart Surgery;  Laterality: N/A;  . ESOPHAGOGASTRODUODENOSCOPY (EGD) WITH PROPOFOL N/A 07/09/2017   Procedure: ESOPHAGOGASTRODUODENOSCOPY (EGD) WITH PROPOFOL;  Surgeon:  [name] Horner, MD;  Location: [name] [name];  Service: [name];  Laterality: N/A;  . INTRAOPERATIVE TRANSESOPHAGEAL ECHOCARDIOGRAM N/A 12/31/2012   Procedure: INTRAOPERATIVE TRANSESOPHAGEAL ECHOCARDIOGRAM;  Surgeon: [name] Isaac, MD;  Location: [name];  Service: Open Heart Surgery;  Laterality: N/A;  . LEFT HEART CATHETERIZATION WITH CORONARY ANGIOGRAM N/A 12/30/2012   Procedure: LEFT HEART CATHETERIZATION WITH CORONARY ANGIOGRAM;  Surgeon: Burnell Blanks, MD;  Location: [name] [name];  Service: Cardiovascular;  Laterality: N/A;    Short Social History:  Social History   Tobacco Use  . Smoking status: Former Smoker    Packs/day: 1.00    Years: 40.00    Pack years: 40.00    Types: Cigarettes    Last attempt to quit: 06/16/1995    Years since quitting: 22.6  . Smokeless tobacco: Never Used  Substance Use Topics  . Alcohol use: No    Alcohol/week: 0.0 standard drinks    Comment: [name] [name] "none in 30 yr; drank some when I was young"    No Known Allergies  Current Outpatient Medications  Medication Sig Dispense Refill  . amLODipine (NORVASC) 5 MG tablet Take 1 tablet (5 mg total) by mouth daily. 90 tablet 3  . aspirin EC 81 MG tablet Take 1 tablet (81 mg total) by mouth daily. (Patient taking differently: Take 81 mg by mouth every evening. )  90 tablet 3  . atorvastatin (LIPITOR) 20 MG tablet Take 1 tablet (20 mg total) by mouth daily. 90 tablet 3  . B Complex-C-Folic Acid ([name] [name]) 0.8 MG TABS Take 1 tablet by mouth daily.  3  . calcium acetate (PHOSLO) 667 MG capsule Take 1-2 capsules (667-1,334 mg total) by mouth See admin instructions. Take 2 capsules (1334 mg) with meals and 1 capsule (667 mg) with snacks or a sandwich 180 capsule 1  . cinacalcet (SENSIPAR) 60 MG tablet Take 60 mg by mouth at bedtime.     Marland Kitchen latanoprost (XALATAN) 0.005 % ophthalmic solution Place 1 drop into both eyes at bedtime.    Marland Kitchen leuprolide ([name] [name]) 30 MG injection Inject 30 mg into the muscle  every 4 (four) months.    . pantoprazole (PROTONIX) 40 MG tablet Take 1 tablet (40 mg total) by mouth daily. 30 tablet 1  . [name] [name] 40 MG capsule TAKE 4 CAPSULES (160 MG TOTAL) BY MOUTH DAILY. 120 capsule 0   No current facility-administered medications for this visit.     Review of Systems  Constitutional:  Constitutional negative. HENT: HENT negative.  Eyes: Eyes negative.  Respiratory: Respiratory negative.  Cardiovascular: Cardiovascular negative.  GI: Gastrointestinal negative.  Musculoskeletal: Musculoskeletal negative.  Neurological: Neurological negative. Psychiatric: Psychiatric negative.        Objective:  Objective   Vitals:   01/25/18 0919  BP: (!) 132/59  Pulse: 79  Resp: 16  Temp: (!) 96.8 F (36 C)  TempSrc: Oral  SpO2: 100%  Weight: 180 lb (81.6 kg)  Height: 6' (1.829 m)   Body mass index is 24.41 kg/m.  Physical Exam Constitutional:      Appearance: Normal appearance.  Eyes:     Pupils: Pupils are equal, round, and reactive to light.  Neck:     Musculoskeletal: Normal range of motion.  Cardiovascular:     Rate and Rhythm: Normal rate.     Pulses:          Radial pulses are 0 on the right side.  Pulmonary:     Effort: Pulmonary effort is normal.  Musculoskeletal: Normal range of motion.     Comments: Thinned skin over 2 areas of pseudoaneurysm on his right upper extremity AV fistula, the most cephalad aspect does have some ulceration although no bleeding  Neurological:     Mental Status: He is alert.     Data: No studies today     Assessment/Plan:     85 year old male presents for 2 areas of pseudoaneurysm on his fistula.  The most cephalad aspect does appear to have some ulceration.  I discussed with him proceeding with repair of this 1 first so that they can still cannulate the other 1 as well as the rest of the fistula.  We will get this set up on a nondialysis day in the near future.  He demonstrates very good understanding of the  risk benefits and alternatives.     [name] Sandy MD [name]

## 2018-01-30 ENCOUNTER — Inpatient Hospital Stay: Payer: Medicare Other

## 2018-01-30 ENCOUNTER — Telehealth: Payer: Self-pay

## 2018-01-30 ENCOUNTER — Encounter: Payer: Self-pay | Admitting: Oncology

## 2018-01-30 ENCOUNTER — Telehealth: Payer: Self-pay | Admitting: Oncology

## 2018-01-30 ENCOUNTER — Inpatient Hospital Stay: Payer: Medicare Other | Attending: Oncology | Admitting: Oncology

## 2018-01-30 VITALS — BP 115/44 | HR 77 | Temp 97.6°F | Resp 17 | Ht 72.0 in | Wt 175.1 lb

## 2018-01-30 VITALS — BP 112/56 | HR 65 | Temp 98.0°F | Resp 18

## 2018-01-30 DIAGNOSIS — E291 Testicular hypofunction: Secondary | ICD-10-CM | POA: Insufficient documentation

## 2018-01-30 DIAGNOSIS — D649 Anemia, unspecified: Secondary | ICD-10-CM

## 2018-01-30 DIAGNOSIS — C7951 Secondary malignant neoplasm of bone: Secondary | ICD-10-CM | POA: Diagnosis not present

## 2018-01-30 DIAGNOSIS — C61 Malignant neoplasm of prostate: Secondary | ICD-10-CM | POA: Diagnosis present

## 2018-01-30 DIAGNOSIS — N19 Unspecified kidney failure: Secondary | ICD-10-CM | POA: Diagnosis not present

## 2018-01-30 DIAGNOSIS — Z992 Dependence on renal dialysis: Secondary | ICD-10-CM

## 2018-01-30 DIAGNOSIS — Z7189 Other specified counseling: Secondary | ICD-10-CM

## 2018-01-30 LAB — IRON AND TIBC
IRON: 46 ug/dL (ref 42–163)
SATURATION RATIOS: 13 % — AB (ref 20–55)
TIBC: 360 ug/dL (ref 202–409)
UIBC: 315 ug/dL (ref 117–376)

## 2018-01-30 LAB — FERRITIN: Ferritin: 74 ng/mL (ref 24–336)

## 2018-01-30 LAB — CBC WITH DIFFERENTIAL (CANCER CENTER ONLY)
Abs Immature Granulocytes: 0.01 10*3/uL (ref 0.00–0.07)
Basophils Absolute: 0 10*3/uL (ref 0.0–0.1)
Basophils Relative: 1 %
Eosinophils Absolute: 0 10*3/uL (ref 0.0–0.5)
Eosinophils Relative: 1 %
HCT: 26.3 % — ABNORMAL LOW (ref 39.0–52.0)
Hemoglobin: 8.5 g/dL — ABNORMAL LOW (ref 13.0–17.0)
Immature Granulocytes: 0 %
Lymphocytes Relative: 29 %
Lymphs Abs: 1.2 10*3/uL (ref 0.7–4.0)
MCH: 27.2 pg (ref 26.0–34.0)
MCHC: 32.3 g/dL (ref 30.0–36.0)
MCV: 84.3 fL (ref 80.0–100.0)
Monocytes Absolute: 0.5 10*3/uL (ref 0.1–1.0)
Monocytes Relative: 11 %
Neutro Abs: 2.5 10*3/uL (ref 1.7–7.7)
Neutrophils Relative %: 58 %
Platelet Count: 212 10*3/uL (ref 150–400)
RBC: 3.12 MIL/uL — ABNORMAL LOW (ref 4.22–5.81)
RDW: 17.1 % — ABNORMAL HIGH (ref 11.5–15.5)
WBC Count: 4.2 10*3/uL (ref 4.0–10.5)
nRBC: 0 % (ref 0.0–0.2)

## 2018-01-30 LAB — SAMPLE TO BLOOD BANK

## 2018-01-30 MED ORDER — SODIUM CHLORIDE 0.9 % IV SOLN
Freq: Once | INTRAVENOUS | Status: AC
  Administered 2018-01-30: 11:00:00 via INTRAVENOUS
  Filled 2018-01-30: qty 250

## 2018-01-30 MED ORDER — SODIUM CHLORIDE 0.9 % IV SOLN
510.0000 mg | Freq: Once | INTRAVENOUS | Status: AC
  Administered 2018-01-30: 510 mg via INTRAVENOUS
  Filled 2018-01-30: qty 17

## 2018-01-30 NOTE — Patient Instructions (Signed)

## 2018-01-30 NOTE — Progress Notes (Signed)
Hematology and Oncology Follow Up Visit  [name] [name] [id number] 11/04/1930 85 y.o. 01/30/2018 9:56 AM [name] [name], MDElkins, [name] [name], *   Principle Diagnosis: 85 year old man with:  1.  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  He was found to have Gleason score 4+4 = 8 PSA of 369.    2.  Anemia: Related to renal insufficiency and chronic disease.  Prior Therapy: Androgen deprivation therapy and he remains on it under the care of Dr. Diona [name]. His PSA decreased to 31 in 2017. His PSA in November 2017 was 41 and in March 2018 was 29.   Status post intravenous iron as well as recurrent back red cell transfusion. Current therapy:   Xtandi 160 mg daily started in March 2019.  Interim History: [name] [name] returns today for a repeat evaluation.  Since last visit, he reports no major changes in his health.  He continues to tolerate Xtandi without any recent complaints.  His appetite declined slightly but is returning at this time and have lost weight.  He denies any bone pain or pathological fractures.  He denies any falls or syncope.  His quality of life and performance status remains unchanged.  He does not report any headaches, blurry vision, syncope or seizures.  He denies any alteration mental status or confusion.  He denied any fevers, chills, sweats or weight loss. He does not report any chest pain, palpitation, orthopnea or leg edema. He does not report any cough, wheezing or hemoptysis. He does not report any nausea, vomiting or early satiety. He does not report any constipation or diarrhea.  He does not report any skin rashes or lesions. He denies any arthralgias or myalgias.  Denies any changes in his mood.  Remaining review of system is negative.    Medications: I have reviewed the patient's current medications.  Current Outpatient Medications  Medication Sig Dispense Refill  . amLODipine (NORVASC) 5 MG tablet Take 1  tablet (5 mg total) by mouth daily. (Patient taking differently: Take 5 mg by mouth every evening. ) 90 tablet 3  . aspirin EC 81 MG tablet Take 1 tablet (81 mg total) by mouth daily. (Patient taking differently: Take 81 mg by mouth every evening. ) 90 tablet 3  . atorvastatin (LIPITOR) 20 MG tablet Take 1 tablet (20 mg total) by mouth daily. (Patient taking differently: Take 20 mg by mouth every evening. ) 90 tablet 3  . B Complex-C-Folic Acid (DIALYVITE 097) 0.8 MG TABS Take 1 tablet by mouth every evening.   3  . calcium acetate (PHOSLO) 667 MG capsule Take 1-2 capsules (667-1,334 mg total) by mouth See admin instructions. Take 2 capsules (1334 mg) with meals and 1 capsule (667 mg) with snacks or a sandwich (Patient not taking: Reported on 01/28/2018) 180 capsule 1  . cinacalcet (SENSIPAR) 60 MG tablet Take 60 mg by mouth every Tuesday, Thursday, and Saturday at 6 PM.     . latanoprost (XALATAN) 0.005 % ophthalmic solution Place 1 drop into both eyes at bedtime.    Marland Kitchen leuprolide (LUPRON) 30 MG injection Inject 30 mg into the muscle every 4 (four) months.    . pantoprazole (PROTONIX) 40 MG tablet Take 1 tablet (40 mg total) by mouth daily. (Patient not taking: Reported on 01/28/2018) 30 tablet 1  . XTANDI 40 MG capsule TAKE 4 CAPSULES (160 MG TOTAL) BY MOUTH DAILY. (Patient taking differently: Take 160 mg by mouth every evening. ) 120 capsule 0  No current facility-administered medications for this visit.      Allergies: No Known Allergies  Past Medical History, Surgical history, Social history, and Family History were reviewed and updated.  Physical Exam:   Blood pressure (!) 115/44, pulse 77, temperature 97.6 F (36.4 C), temperature source Oral, resp. rate 17, height 6' (1.829 m), weight 175 lb 1.6 oz (79.4 kg), SpO2 100 %.     ECOG: 1    General appearance: Alert, awake without any distress. Head: Atraumatic without abnormalities Oropharynx: Without any thrush or ulcers. Eyes:  No scleral icterus. Lymph nodes: No lymphadenopathy noted in the cervical, supraclavicular, or axillary nodes Heart:regular rate and rhythm, without any murmurs or gallops.   Lung: Clear to auscultation without any rhonchi, wheezes or dullness to percussion. Abdomin: Soft, nontender without any shifting dullness or ascites. Musculoskeletal: No clubbing or cyanosis. Neurological: No motor or sensory deficits. Skin: No rashes or lesions.       Lab Results: Lab Results  Component Value Date   WBC 3.7 (L) 12/19/2017   HGB 9.3 (L) 12/19/2017   HCT 30.2 (L) 12/19/2017   MCV 92.9 12/19/2017   PLT 303 12/19/2017     Chemistry      Component Value Date/Time   NA 141 12/19/2017 0936   NA 143 09/29/2016 0810   K 4.3 12/19/2017 0936   K 4.3 09/29/2016 0810   CL 101 12/19/2017 0936   CO2 27 12/19/2017 0936   CO2 31 (H) 09/29/2016 0810   BUN 20 12/19/2017 0936   BUN 27.9 (H) 09/29/2016 0810   CREATININE 3.74 (HH) 12/19/2017 0936   CREATININE 7.4 (HH) 09/29/2016 0810      Component Value Date/Time   CALCIUM 9.0 12/19/2017 0936   CALCIUM 9.9 09/29/2016 0810   ALKPHOS 123 12/19/2017 0936   ALKPHOS 141 09/29/2016 0810   AST 16 12/19/2017 0936   AST 21 09/29/2016 0810   ALT 6 12/19/2017 0936   ALT 10 09/29/2016 0810   BILITOT 0.8 12/19/2017 0936   BILITOT 0.48 09/29/2016 0810         Results for [name] [name] (MRN [id number]) as of 01/30/2018 09:56  Ref. Range 09/19/2017 09:15 10/31/2017 08:47 12/19/2017 09:36  Prostate Specific Ag, Serum Latest Ref Range: 0.0 - 4.0 ng/mL 1.4 0.4 0.3        Impression and Plan:   85 year old man with:  1.  Castration-resistant prostate cancer documented in 2019 with known disease to the bone and adenopathy.   He is on Xtandi which he has tolerated very well without any complications.  His PSA continues to respond currently at 0.3 without any symptoms related to cancer progression.  Risks and benefits of continuing this therapy was  discussed today.  He is agreeable to continue given his excellent response and tolerance.  2. Androgen deprivation therapy: He continues to be on Lupron under the care of Dr. Diona [name].  I recommended continuing this indefinitely.  He has no long-term complications noted.   3.  Anemia: His hemoglobin has dropped slightly down to 8.5.  Iron studies are currently pending.  Risks and benefits of repeat Feraheme infusion was discussed today if his iron studies are low.  We will defer packed red cell transfusion and proceed with intravenous iron today.  Infusion related complications such as arthralgias, myalgias and rarely anaphylaxis were reiterated.  4.  Prognosis and goals of care: His disease is incurable and aggressive therapy is warranted given his reasonable performance status.  5.  Renal failure:  Continues to receive dialysis without any issues.  6. Follow-up: In 8 weeks to follow his progress.  25  minutes was spent with the patient face-to-face today.  More than 50% of time was dedicated to reviewing his disease status, treatment options and managing complications related to his cancer and anemia.  Zola Button, MD 1/22/20209:56 AM

## 2018-01-30 NOTE — Telephone Encounter (Signed)
Scheduled appt per 01/22 los. ° °Printed calendar and avs. °

## 2018-01-30 NOTE — Telephone Encounter (Signed)
Spoke with infusion RN Learta Codding and communicated that patient will not be receiving PRBC's today but will receive iron infusion instead per Dr. Alen Blew and orders are being entered right now.

## 2018-02-01 ENCOUNTER — Other Ambulatory Visit: Payer: Self-pay | Admitting: Oncology

## 2018-02-01 DIAGNOSIS — C61 Malignant neoplasm of prostate: Secondary | ICD-10-CM

## 2018-02-04 ENCOUNTER — Encounter (HOSPITAL_COMMUNITY): Payer: Self-pay | Admitting: *Deleted

## 2018-02-04 ENCOUNTER — Other Ambulatory Visit: Payer: Self-pay

## 2018-02-04 NOTE — Progress Notes (Signed)
[name] [name] denies chest pain or shortness of breath.  PCP is Dr Welton Flakes in [facility].  [name] Civello has type II diabetes- it is diet controlled and he does not check CBG.

## 2018-02-05 NOTE — Anesthesia Preprocedure Evaluation (Addendum)
Anesthesia Evaluation  Patient identified by MRN, date of birth, ID band Patient awake    Reviewed: Allergy & Precautions, NPO status , Patient's Chart, lab work & pertinent test results  Airway Mallampati: II  TM Distance: >3 FB Neck ROM: Full    Dental  (+) Dental Advisory Given, Poor Dentition, Missing   Pulmonary former smoker,    Pulmonary exam normal breath sounds clear to auscultation       Cardiovascular hypertension, Pt. on medications + CAD, + Past MI and + CABG  Normal cardiovascular exam Rhythm:Regular Rate:Normal     Neuro/Psych negative neurological ROS  negative psych ROS   GI/Hepatic Neg liver ROS, GERD  Medicated,  Endo/Other  diabetes, Type 2  Renal/GU ESRF and DialysisRenal disease (TThSat- K+ 3.8)pseudoaneurysm right arm fistula   H/o metastatic prostate cancer     Musculoskeletal negative musculoskeletal ROS (+)   Abdominal   Peds  Hematology  (+) Blood dyscrasia, anemia ,   Anesthesia Other Findings Day of surgery medications reviewed with the patient.  Reproductive/Obstetrics                           Anesthesia Physical Anesthesia Plan  ASA: III  Anesthesia Plan: General   Post-op Pain Management:    Induction: Intravenous  PONV Risk Score and Plan: 2 and Ondansetron and Dexamethasone  Airway Management Planned: LMA  Additional Equipment:   Intra-op Plan:   Post-operative Plan: Extubation in OR  Informed Consent: I have reviewed the patients History and Physical, chart, labs and discussed the procedure including the risks, benefits and alternatives for the proposed anesthesia with the patient or authorized representative who has indicated his/her understanding and acceptance.     Dental advisory given  Plan Discussed with: CRNA  Anesthesia Plan Comments:        Anesthesia Quick Evaluation

## 2018-02-05 NOTE — Progress Notes (Signed)
Anesthesia Chart Review: SAME DAY WORKUP   Case:  [id number] Date/Time:  02/06/18 0715   Procedure:  REVISION PLICATION OF ARTERIOVENOUS FISTULA RIGHT ARM (Right )   Anesthesia type:  Choice   Pre-op diagnosis:  pseudoaneurysm right arm fistula   Location:  [place] OR ROOM 12 / [place] OR   Surgeon:  Waynetta Sandy, MD      DISCUSSION: 85 yo male former smoker. Pertinent hx includes CAD/NSTEMI1/2011&12/2012 (s/p CABG: LIMA-LAD, SVG-INT, SVG-OM, SVG-PDA) 12/31/12, ESRD on HD, advanced prostate cancer with bone disease and adenopathy,hypertension, dyslipidemia, anemia of chronic disease, diabetes mellitus type 2(diet controlled),gout, anemia.  Follows with Dr. Lolly Mustache for hx of CAD. Last seen 09/24/2017. Per OV note pt doing well from CV standpoint. Nuclear stress 05/18/2017 showed a small size, irreversible defect in the basal and mid inferior walls consistent with prior infarction, no ischemia is seen. LVEF is mildly decreased at 50%. Dr. Percival Spanish advised 26yr followup.  Anemia of chronic disease. Followed by Dr. Alen Blew. Most recent labs 01/30/18 with Hgb 8.5, trending down recently. Will need DOS labs. Hemoglobin (g/dL)  Date Value  01/30/2018 8.5 (L)  12/19/2017 9.3 (L)  11/21/2017 9.6 (L)  10/31/2017 10.2 (L)  10/05/2017 7.9 (L)   Anticipate he can proceed as planned barring acute status change and DOS labs acceptable.  VS: There were no vitals taken for this visit.  PROVIDERS: [name], MD is PCP  [facility] for nephrology care  Zola Button, MD is Oncologist  Minus Breeding, MD is Cardiologist  LABS: Will need DOS labs  Labs Reviewed - No data to display   IMAGES: CHEST - 2 VIEW 07/06/2017  COMPARISON:  Chest x-ray 04/11/2017.  FINDINGS: Prior CABG. Stable cardiomegaly. Low lung volumes with mild basilar atelectasis. No pleural effusion or pneumothorax. Degenerative change thoracic spine.  IMPRESSION: 1.  Prior CABG.  Stable  cardiomegaly.  2.  Low lung volumes with mild basilar atelectasis.  EKG: 09/07/2017: NSR, LVH, Rate 69, ST & T wave abnormality, consider inferior ischemia, ST & T wave abnormality, consider anterolateral ischemia. No significant change was found.  CV: Nuclear stress 05/18/2017:  The left ventricular ejection fraction is mildly decreased (45-54%).  Nuclear stress EF: 50%.  There was no ST segment deviation noted during stress.  Defect 1: There is a small defect of severe severity present in the basal inferior and mid inferior location.  Findings consistent with prior myocardial infarction.  This is an intermediate risk study.   There is a small size, irreversible defect in the basal and mid inferior walls consistent with prior infarction, no ischemia is seen. LVEF is mildly decreased at 50%.   TTE 05/11/2017: Study Conclusions  - Procedure narrative: Transthoracic echocardiography. Image   quality was suboptimal. The study was technically difficult.   Intravenous contrast (Definity) was administered. - Left ventricle: The cavity size was mildly dilated. Systolic   function was normal. The estimated ejection fraction was in the   range of 55% to 60%. Wall motion was normal; there were no   regional wall motion abnormalities. There was an increased   relative contribution of atrial contraction to ventricular   filling. Doppler parameters are consistent with abnormal left   ventricular relaxation (grade 1 diastolic dysfunction). - Aortic valve: Severe diffuse thickening and calcification   involving the noncoronary cusp. Noncoronary cusp immobility was   noted. There was mild regurgitation. Regurgitation pressure   half-time: 452 ms. - Aorta: Ascending aortic diameter: 40 mm (S). - Ascending  aorta: The ascending aorta was mildly dilated. - Mitral valve: Severely calcified annulus. There was trivial   regurgitation. - Left atrium: The atrium was mildly dilated. - Right  ventricle: The cavity size was moderately dilated. Wall   thickness was normal. - Pulmonic valve: There was mild regurgitation. - Pulmonary arteries: PA peak pressure: 36 mm Hg (S).   Past Medical History:  Diagnosis Date  . Anemia of chronic disease   . Coronary artery disease   . Dialysis patient ([place])    T, TH, Sat  . Dyslipidemia   . ESRD (end stage renal disease) on dialysis [facility])    "[employer]; T, Thurs, Sat" (12/26/2012)  . Gout   . History of blood transfusion   . Hypertension   . Metastatic cancer to bone ([place]) dx'd 04/04/17  . MYOCARDIAL INFARCTION, ACUTE, NON-Q WAVE 04/26/2009   Qualifier: Diagnosis of  By: Percival Spanish, MD, Farrel Gordon    . Obesity   . Prostate CA ([place]) dx'd 12/2014   initial dx  . Type II diabetes mellitus ([place])    "diet controlled" (12/26/2012)    Past Surgical History:  Procedure Laterality Date  . ARTERIOVENOUS GRAFT PLACEMENT Right 12/2008   "upper arm"  . AV FISTULA PLACEMENT    . BIOPSY  07/09/2017   Procedure: BIOPSY;  Surgeon: Wonda Horner, MD;  Location: [facility] ENDOSCOPY;  Service: Endoscopy;;  . CARDIAC CATHETERIZATION  12/30/2012  . COLONOSCOPY W/ POLYPECTOMY    . CORONARY ARTERY BYPASS GRAFT N/A 12/31/2012   Procedure: CORONARY ARTERY BYPASS GRAFTING times four using left internal mammary and right saphenous vein;  Surgeon: Grace Isaac, MD;  Location: [place];  Service: Open Heart Surgery;  Laterality: N/A;  . ESOPHAGOGASTRODUODENOSCOPY (EGD) WITH PROPOFOL N/A 07/09/2017   Procedure: ESOPHAGOGASTRODUODENOSCOPY (EGD) WITH PROPOFOL;  Surgeon: Wonda Horner, MD;  Location: [facility] ENDOSCOPY;  Service: Endoscopy;  Laterality: N/A;  . EYE SURGERY Bilateral    cataract  . INTRAOPERATIVE TRANSESOPHAGEAL ECHOCARDIOGRAM N/A 12/31/2012   Procedure: INTRAOPERATIVE TRANSESOPHAGEAL ECHOCARDIOGRAM;  Surgeon: Grace Isaac, MD;  Location: [place];  Service: Open Heart Surgery;  Laterality: N/A;  . LEFT HEART CATHETERIZATION WITH CORONARY ANGIOGRAM N/A  12/30/2012   Procedure: LEFT HEART CATHETERIZATION WITH CORONARY ANGIOGRAM;  Surgeon: Burnell Blanks, MD;  Location: [facility] [place] [facility] CATH LAB;  Service: Cardiovascular;  Laterality: N/A;    MEDICATIONS: No current facility-administered medications for this encounter.    Marland Kitchen amLODipine (NORVASC) 5 MG tablet  . aspirin EC 81 MG tablet  . atorvastatin (LIPITOR) 20 MG tablet  . B Complex-C-Folic Acid (DIALYVITE 329) 0.8 MG TABS  . cinacalcet (SENSIPAR) 60 MG tablet  . latanoprost (XALATAN) 0.005 % ophthalmic solution  . leuprolide (LUPRON) 30 MG injection  . calcium acetate (PHOSLO) 667 MG capsule  . pantoprazole (PROTONIX) 40 MG tablet  . XTANDI 40 MG capsule    Wynonia Musty [facility] Short Stay Center/Anesthesiology Phone [phone] 02/05/2018 10:37 AM

## 2018-02-06 ENCOUNTER — Ambulatory Visit (HOSPITAL_COMMUNITY): Payer: Medicare Other | Admitting: Physician Assistant

## 2018-02-06 ENCOUNTER — Telehealth: Payer: Self-pay | Admitting: Vascular Surgery

## 2018-02-06 ENCOUNTER — Ambulatory Visit (HOSPITAL_COMMUNITY)
Admission: RE | Admit: 2018-02-06 | Discharge: 2018-02-06 | Disposition: A | Discharge status: Home / Self Care | Payer: Medicare Other | Attending: Vascular Surgery | Admitting: Vascular Surgery

## 2018-02-06 ENCOUNTER — Encounter (HOSPITAL_COMMUNITY): Payer: Self-pay

## 2018-02-06 ENCOUNTER — Other Ambulatory Visit: Payer: Self-pay

## 2018-02-06 ENCOUNTER — Encounter (HOSPITAL_COMMUNITY)
Admission: RE | Disposition: A | Discharge status: Home / Self Care | Payer: Self-pay | Source: Home / Self Care | Attending: Vascular Surgery

## 2018-02-06 DIAGNOSIS — Z7982 Long term (current) use of aspirin: Secondary | ICD-10-CM | POA: Diagnosis not present

## 2018-02-06 DIAGNOSIS — Z992 Dependence on renal dialysis: Secondary | ICD-10-CM

## 2018-02-06 DIAGNOSIS — C61 Malignant neoplasm of prostate: Secondary | ICD-10-CM | POA: Diagnosis not present

## 2018-02-06 DIAGNOSIS — E785 Hyperlipidemia, unspecified: Secondary | ICD-10-CM | POA: Insufficient documentation

## 2018-02-06 DIAGNOSIS — I2510 Atherosclerotic heart disease of native coronary artery without angina pectoris: Secondary | ICD-10-CM | POA: Insufficient documentation

## 2018-02-06 DIAGNOSIS — N186 End stage renal disease: Secondary | ICD-10-CM | POA: Insufficient documentation

## 2018-02-06 DIAGNOSIS — Z951 Presence of aortocoronary bypass graft: Secondary | ICD-10-CM | POA: Insufficient documentation

## 2018-02-06 DIAGNOSIS — Z87891 Personal history of nicotine dependence: Secondary | ICD-10-CM | POA: Insufficient documentation

## 2018-02-06 DIAGNOSIS — D631 Anemia in chronic kidney disease: Secondary | ICD-10-CM | POA: Diagnosis not present

## 2018-02-06 DIAGNOSIS — E1122 Type 2 diabetes mellitus with diabetic chronic kidney disease: Secondary | ICD-10-CM | POA: Insufficient documentation

## 2018-02-06 DIAGNOSIS — I120 Hypertensive chronic kidney disease with stage 5 chronic kidney disease or end stage renal disease: Secondary | ICD-10-CM | POA: Diagnosis not present

## 2018-02-06 DIAGNOSIS — Y832 Surgical operation with anastomosis, bypass or graft as the cause of abnormal reaction of the patient, or of later complication, without mention of misadventure at the time of the procedure: Secondary | ICD-10-CM | POA: Insufficient documentation

## 2018-02-06 DIAGNOSIS — T82898A Other specified complication of vascular prosthetic devices, implants and grafts, initial encounter: Secondary | ICD-10-CM

## 2018-02-06 DIAGNOSIS — K219 Gastro-esophageal reflux disease without esophagitis: Secondary | ICD-10-CM | POA: Diagnosis not present

## 2018-02-06 DIAGNOSIS — I252 Old myocardial infarction: Secondary | ICD-10-CM | POA: Insufficient documentation

## 2018-02-06 DIAGNOSIS — Z79899 Other long term (current) drug therapy: Secondary | ICD-10-CM | POA: Insufficient documentation

## 2018-02-06 HISTORY — DX: Personal history of other medical treatment: Z92.89

## 2018-02-06 HISTORY — PX: REVISON OF ARTERIOVENOUS FISTULA: SHX6074

## 2018-02-06 LAB — POCT I-STAT 4, (NA,K, GLUC, HGB,HCT)
Glucose, Bld: 91 mg/dL (ref 70–99)
HCT: 24 % — ABNORMAL LOW (ref 39.0–52.0)
Hemoglobin: 8.2 g/dL — ABNORMAL LOW (ref 13.0–17.0)
Potassium: 3.8 mmol/L (ref 3.5–5.1)
Sodium: 139 mmol/L (ref 135–145)

## 2018-02-06 LAB — GLUCOSE, CAPILLARY
Glucose-Capillary: 81 mg/dL (ref 70–99)
Glucose-Capillary: 117 mg/dL — ABNORMAL HIGH (ref 70–99)
Glucose-Capillary: 75 mg/dL (ref 70–99)
Glucose-Capillary: 81 mg/dL (ref 70–99)
Glucose-Capillary: 71 mg/dL (ref 70–99)
Glucose-Capillary: 128 mg/dL — ABNORMAL HIGH (ref 70–99)
Glucose-Capillary: 106 mg/dL — ABNORMAL HIGH (ref 70–99)

## 2018-02-06 SURGERY — REVISON OF ARTERIOVENOUS FISTULA
Anesthesia: General | Laterality: Right

## 2018-02-06 MED ORDER — DEXAMETHASONE SODIUM PHOSPHATE 10 MG/ML IJ SOLN
INTRAMUSCULAR | Status: AC
  Filled 2018-02-06: qty 1

## 2018-02-06 MED ORDER — SODIUM CHLORIDE 0.9 % IV SOLN
INTRAVENOUS | Status: DC
  Administered 2018-02-06: 09:00:00 via INTRAVENOUS

## 2018-02-06 MED ORDER — EPHEDRINE SULFATE-NACL 50-0.9 MG/10ML-% IV SOSY
PREFILLED_SYRINGE | INTRAVENOUS | Status: DC | PRN
  Administered 2018-02-06: 10 mg via INTRAVENOUS

## 2018-02-06 MED ORDER — PROPOFOL 10 MG/ML IV BOLUS
INTRAVENOUS | Status: AC
  Filled 2018-02-06: qty 20

## 2018-02-06 MED ORDER — 0.9 % SODIUM CHLORIDE (POUR BTL) OPTIME
TOPICAL | Status: DC | PRN
  Administered 2018-02-06: 1000 mL

## 2018-02-06 MED ORDER — CEFAZOLIN SODIUM-DEXTROSE 2-4 GM/100ML-% IV SOLN
INTRAVENOUS | Status: AC
  Filled 2018-02-06: qty 100

## 2018-02-06 MED ORDER — PROPOFOL 10 MG/ML IV BOLUS
INTRAVENOUS | Status: DC | PRN
  Administered 2018-02-06: 150 mg via INTRAVENOUS

## 2018-02-06 MED ORDER — SODIUM CHLORIDE 0.9 % IV SOLN
INTRAVENOUS | Status: DC | PRN
  Administered 2018-02-06: 12:00:00

## 2018-02-06 MED ORDER — FENTANYL CITRATE (PF) 250 MCG/5ML IJ SOLN
INTRAMUSCULAR | Status: AC
  Filled 2018-02-06: qty 5

## 2018-02-06 MED ORDER — DEXTROSE 50 % IV SOLN
INTRAVENOUS | Status: AC
  Administered 2018-02-06: 50 mL via INTRAVENOUS
  Filled 2018-02-06: qty 50

## 2018-02-06 MED ORDER — SODIUM CHLORIDE 0.9 % IV SOLN
INTRAVENOUS | Status: DC | PRN
  Administered 2018-02-06: 50 ug/min via INTRAVENOUS

## 2018-02-06 MED ORDER — ONDANSETRON HCL 4 MG/2ML IJ SOLN
INTRAMUSCULAR | Status: AC
  Filled 2018-02-06: qty 2

## 2018-02-06 MED ORDER — LIDOCAINE 2% (20 MG/ML) 5 ML SYRINGE
INTRAMUSCULAR | Status: AC
  Filled 2018-02-06: qty 5

## 2018-02-06 MED ORDER — PAPAVERINE HCL 30 MG/ML IJ SOLN
INTRAMUSCULAR | Status: AC
  Filled 2018-02-06: qty 2

## 2018-02-06 MED ORDER — SODIUM CHLORIDE 0.9 % IV SOLN
INTRAVENOUS | Status: AC
  Filled 2018-02-06: qty 1.2

## 2018-02-06 MED ORDER — DEXAMETHASONE SODIUM PHOSPHATE 10 MG/ML IJ SOLN
INTRAMUSCULAR | Status: DC | PRN
  Administered 2018-02-06: 5 mg via INTRAVENOUS

## 2018-02-06 MED ORDER — FENTANYL CITRATE (PF) 100 MCG/2ML IJ SOLN: 25.0000 ug | INTRAMUSCULAR | Status: DC | PRN

## 2018-02-06 MED ORDER — LIDOCAINE HCL (PF) 1 % IJ SOLN
INTRAMUSCULAR | Status: AC
  Filled 2018-02-06: qty 30

## 2018-02-06 MED ORDER — OXYCODONE-ACETAMINOPHEN 5-325 MG PO TABS: 1.0000 | ORAL_TABLET | Freq: Four times a day (QID) | ORAL | 0 refills | Status: DC | PRN

## 2018-02-06 MED ORDER — DEXTROSE 50 % IV SOLN
25.0000 mL | Freq: Once | INTRAVENOUS | Status: AC
  Administered 2018-02-06: 25 mL via INTRAVENOUS
  Filled 2018-02-06: qty 50

## 2018-02-06 MED ORDER — FENTANYL CITRATE (PF) 100 MCG/2ML IJ SOLN
INTRAMUSCULAR | Status: DC | PRN
  Administered 2018-02-06 (×2): 25 ug via INTRAVENOUS

## 2018-02-06 MED ORDER — CEFAZOLIN SODIUM-DEXTROSE 2-4 GM/100ML-% IV SOLN
2.0000 g | INTRAVENOUS | Status: AC
  Administered 2018-02-06: 2 g via INTRAVENOUS

## 2018-02-06 MED ORDER — ONDANSETRON HCL 4 MG/2ML IJ SOLN
INTRAMUSCULAR | Status: DC | PRN
  Administered 2018-02-06: 4 mg via INTRAVENOUS

## 2018-02-06 MED ORDER — DEXTROSE 50 % IV SOLN
50.0000 mL | Freq: Once | INTRAVENOUS | Status: AC
  Administered 2018-02-06: 50 mL via INTRAVENOUS

## 2018-02-06 MED ORDER — LIDOCAINE 2% (20 MG/ML) 5 ML SYRINGE
INTRAMUSCULAR | Status: DC | PRN
  Administered 2018-02-06: 60 mg via INTRAVENOUS

## 2018-02-06 SURGICAL SUPPLY — 28 items
ADH SKN CLS APL DERMABOND .7 (GAUZE/BANDAGES/DRESSINGS) ×1
ARMBAND PINK RESTRICT EXTREMIT (MISCELLANEOUS) ×3 IMPLANT
CANISTER SUCT 3000ML PPV (MISCELLANEOUS) ×3 IMPLANT
CLIP VESOCCLUDE MED 6/CT (CLIP) ×3 IMPLANT
CLIP VESOCCLUDE SM WIDE 6/CT (CLIP) ×3 IMPLANT
COVER PROBE W GEL 5X96 (DRAPES) ×3 IMPLANT
COVER WAND RF STERILE (DRAPES) ×3 IMPLANT
DERMABOND ADVANCED (GAUZE/BANDAGES/DRESSINGS) ×2
DERMABOND ADVANCED .7 DNX12 (GAUZE/BANDAGES/DRESSINGS) ×1 IMPLANT
ELECT REM PT RETURN 9FT ADLT (ELECTROSURGICAL) ×3
ELECTRODE REM PT RTRN 9FT ADLT (ELECTROSURGICAL) ×1 IMPLANT
GLOVE BIO SURGEON STRL SZ7.5 (GLOVE) ×3 IMPLANT
GOWN STRL REUS W/ TWL LRG LVL3 (GOWN DISPOSABLE) ×2 IMPLANT
GOWN STRL REUS W/ TWL XL LVL3 (GOWN DISPOSABLE) ×1 IMPLANT
GOWN STRL REUS W/TWL LRG LVL3 (GOWN DISPOSABLE) ×6
GOWN STRL REUS W/TWL XL LVL3 (GOWN DISPOSABLE) ×3
KIT BASIN OR (CUSTOM PROCEDURE TRAY) ×3 IMPLANT
KIT TURNOVER KIT B (KITS) ×3 IMPLANT
NS IRRIG 1000ML POUR BTL (IV SOLUTION) ×3 IMPLANT
PACK CV ACCESS (CUSTOM PROCEDURE TRAY) ×3 IMPLANT
PAD ARMBOARD 7.5X6 YLW CONV (MISCELLANEOUS) ×6 IMPLANT
SUT MNCRL AB 4-0 PS2 18 (SUTURE) ×3 IMPLANT
SUT PROLENE 6 0 BV (SUTURE) ×3 IMPLANT
SUT VIC AB 3-0 SH 27 (SUTURE) ×3
SUT VIC AB 3-0 SH 27X BRD (SUTURE) ×1 IMPLANT
TOWEL GREEN STERILE (TOWEL DISPOSABLE) ×3 IMPLANT
UNDERPAD 30X30 (UNDERPADS AND DIAPERS) ×3 IMPLANT
WATER STERILE IRR 1000ML POUR (IV SOLUTION) ×3 IMPLANT

## 2018-02-06 NOTE — Progress Notes (Signed)
Dr. Gennie Alma made aware of recent CBG result, new order received.

## 2018-02-06 NOTE — Anesthesia Procedure Notes (Signed)
Procedure Name: LMA Insertion Date/Time: 02/06/2018 12:04 PM Performed by: Genelle Bal, CRNA Pre-anesthesia Checklist: Patient identified, Emergency Drugs available, Suction available and Patient being monitored Patient Re-evaluated:Patient Re-evaluated prior to induction Oxygen Delivery Method: Circle system utilized Preoxygenation: Pre-oxygenation with 100% oxygen Induction Type: IV induction Ventilation: Mask ventilation without difficulty LMA: LMA inserted LMA Size: 4.0 Number of attempts: 1 Airway Equipment and Method: Bite block Placement Confirmation: positive ETCO2 Tube secured with: Tape Dental Injury: Teeth and Oropharynx as per pre-operative assessment

## 2018-02-06 NOTE — Progress Notes (Signed)
Dr. Gifford Shave made aware of current CBG, new orders received.

## 2018-02-06 NOTE — H&P (Signed)
   History and Physical Update  The patient was interviewed and re-examined.  The patient's previous History and Physical has been reviewed and is unchanged from recent office visit. Plan for right arm pseudoaneurysm repair.   Brandon C. Donzetta Matters, MD [facility] [place] Office: [phone] Pager: [phone]   02/06/2018, 11:15 AM

## 2018-02-06 NOTE — Telephone Encounter (Signed)
-----   Message from Gabriel Earing, [place] sent at 02/06/2018 12:39 PM EST ----- S/p revision of RUA AVF 02/06/2018.  F/u in [place] clinic on a Friday when Dr. Donzetta Matters is in the office in a couple of weeks.  Thanks

## 2018-02-06 NOTE — Progress Notes (Signed)
Pt s/p revision RUA AVF on 02/06/2018 by Dr. Donzetta Matters.  Will see pt back in 2-3 weeks to check fistula and evaluate for revision of other aneurysmal area of fistula on a day when Dr. Donzetta Matters is in the office.   [name], [facility] 02/06/2018 12:41 PM

## 2018-02-06 NOTE — Transfer of Care (Signed)
Immediate Anesthesia Transfer of Care Note  Patient: [name] [name]  Procedure(s) Performed: REVISION PLICATION OF ARTERIOVENOUS FISTULA RIGHT ARM (Right )  Patient Location: PACU  Anesthesia Type:General  Level of Consciousness: drowsy and patient cooperative  Airway & Oxygen Therapy: Patient Spontanous Breathing and Patient connected to face mask oxygen  Post-op Assessment: Report given to RN and Post -op Vital signs reviewed and stable  Post vital signs: Reviewed and stable  Last Vitals:  Vitals Value Taken Time  BP    Temp    Pulse 64 02/06/2018 12:49 PM  Resp 11 02/06/2018 12:49 PM  SpO2 100 % 02/06/2018 12:49 PM  Vitals shown include unvalidated device data.  Last Pain:  Vitals:   02/06/18 0604  TempSrc:   PainSc: 0-No pain      Patients Stated Pain Goal: 4 (80/16/55 3748)  Complications: No apparent anesthesia complications

## 2018-02-06 NOTE — Telephone Encounter (Signed)
sch appt lvm mld ltr 03/01/2018 115pm p/o NP

## 2018-02-06 NOTE — Discharge Instructions (Signed)
[facility]  Discharge Instructions  AV Fistula or Graft Surgery for Dialysis Access  Please refer to the following instructions for your post-procedure care. Your surgeon or physician assistant will discuss any changes with you.  Activity  You may drive the day following your surgery, if you are comfortable and no longer taking prescription pain medication. Resume full activity as the soreness in your incision resolves.  Bathing/Showering  You may shower after you go home. Keep your incision dry for 48 hours. Do not soak in a bathtub, hot tub, or swim until the incision heals completely. You may not shower if you have a hemodialysis catheter.  Incision Care  Clean your incision with mild soap and water after 48 hours. Pat the area dry with a clean towel. You do not need a bandage unless otherwise instructed. Do not apply any ointments or creams to your incision. You may have skin glue on your incision. Do not peel it off. It will come off on its own in about one week. Your arm may swell a bit after surgery. To reduce swelling use pillows to elevate your arm so it is above your heart. Your doctor will tell you if you need to lightly wrap your arm with an ACE bandage.  Remove ace wrap at dialysis tomorrow.  Diet  Resume your normal diet. There are not special food restrictions following this procedure. In order to heal from your surgery, it is CRITICAL to get adequate nutrition. Your body requires vitamins, minerals, and protein. Vegetables are the best source of vitamins and minerals. Vegetables also provide the perfect balance of protein. Processed food has little nutritional value, so try to avoid this.  Medications  Resume taking all of your medications. If your incision is causing pain, you may take over-the counter pain relievers such as acetaminophen (Tylenol). If you were prescribed a stronger pain medication, please be aware these medications can  cause nausea and constipation. Prevent nausea by taking the medication with a snack or meal. Avoid constipation by drinking plenty of fluids and eating foods with high amount of fiber, such as fruits, vegetables, and grains.  Do not take Tylenol if you are taking prescription pain medications.  Follow up Your surgeon may want to see you in the office following your access surgery. If so, this will be arranged at the time of your surgery.  Please call us immediately for any of the following conditions:  Increased pain, redness, drainage (pus) from your incision site Fever of 101 degrees or higher Severe or worsening pain at your incision site Hand pain or numbness.  Reduce your risk of vascular disease:  Stop smoking. If you would like help, call [facility] at 1-800-QUIT-NOW (203)161-3737) or [place] [facility] [place] your cholesterol Maintain a desired weight Control your diabetes Keep your blood pressure down  Dialysis  It will take several weeks to several months for your new dialysis access to be ready for use. Your surgeon will determine when it is okay to use it. Your nephrologist will continue to direct your dialysis. You can continue to use your Permcath until your new access is ready for use.   02/06/2018 [name] [name] [id number] [birth year]  Surgeon(s): Waynetta Sandy, MD  Procedure(s): REVISION PLICATION OF ARTERIOVENOUS FISTULA RIGHT ARM  x May stick graft on designated area only:  Do NOT stick over incision x 2 months. May stick fistula otherwise.   If you have any questions, please call the  office at [phone].

## 2018-02-06 NOTE — Anesthesia Postprocedure Evaluation (Signed)
Anesthesia Post Note  Patient: [name] [name]  Procedure(s) Performed: REVISION PLICATION OF ARTERIOVENOUS FISTULA RIGHT ARM (Right )     Patient location during evaluation: PACU Anesthesia Type: General Level of consciousness: awake Pain management: pain level controlled Vital Signs Assessment: post-procedure vital signs reviewed and stable Respiratory status: spontaneous breathing Cardiovascular status: stable Postop Assessment: no apparent nausea or vomiting Anesthetic complications: no    Last Vitals:  Vitals:   02/06/18 1320 02/06/18 1335  BP: (!) 112/52 (!) 104/52  Pulse: 68 68  Resp: 14 14  Temp:  36.5 C  SpO2: 97% 99%    Last Pain:  Vitals:   02/06/18 1335  TempSrc:   PainSc: 0-No pain                 Dyamon Sosinski

## 2018-02-06 NOTE — Op Note (Signed)
    Patient name: [name] [name] MRN: [id number] DOB: 11-19-1930 Sex: male  02/06/2018 Pre-operative Diagnosis: End-stage renal disease, ulceration right arm AV fistula Post-operative diagnosis:  Same Surgeon:  Eda Paschal. Donzetta Matters, MD Assistant: [name], PA Procedure Performed: Revision with plication right arm AV fistula  Indications: 85 year old male with history end-stage renal disease on dialysis via right arm fistula.  He has 2 areas of pseudoaneurysmal degeneration 1 of which has ulceration.  He is indicated for revision with plication of 1 of the areas allowing him to continue to dialyze through this fistula.  Findings: The ulcerative area was significantly adherent to the skin.  At completion pseudoaneurysm was plicated we had a thrill in the fistula.  The other pseudoaneurysm does not appear to have any ulceration.   Procedure:  The patient was identified in the holding area and taken to operating room where he was placed supine operative table and general anesthesia induced.  Sterilely prepped draped right upper extremity usual fashion given antibiotics and a timeout was called.  Esmarch was used to exsanguinate the right upper extremity and tourniquet inflated 250 mmHg.  Elliptical incision was made around the area of ulceration.  I dissected out the fistula from the surrounding skin and soft tissue sharply.  I then plicated the fistula with running 4-0 Prolene suture in a mattress configuration.  Prior to completion anastomosis irrigated with heparinized saline.  Upon completion we allowed the tourniquet down.  There was a thrill in the fistula.  Satisfied we obtain hemostasis irrigated the wound closed with 4-0 Monocryl dermal was placed in the skin.  He tolerated procedure without immediate complication.  EBL: 10 cc   Elroy Schembri C. Donzetta Matters, MD [facility] [place] Office: [phone] Pager: [phone]

## 2018-02-07 ENCOUNTER — Encounter (HOSPITAL_COMMUNITY): Payer: Self-pay | Admitting: Vascular Surgery

## 2018-02-08 MED FILL — XTANDI 40 MG CAPSULE: 40 | 30 days supply | Qty: 120 | Fill #0

## 2018-02-15 ENCOUNTER — Other Ambulatory Visit: Payer: Self-pay

## 2018-02-15 DIAGNOSIS — C61 Malignant neoplasm of prostate: Secondary | ICD-10-CM

## 2018-02-20 ENCOUNTER — Other Ambulatory Visit: Payer: Self-pay | Admitting: *Deleted

## 2018-02-20 ENCOUNTER — Telehealth: Payer: Self-pay | Admitting: *Deleted

## 2018-02-20 ENCOUNTER — Inpatient Hospital Stay: Payer: Medicare Other | Attending: Oncology

## 2018-02-20 DIAGNOSIS — C61 Malignant neoplasm of prostate: Secondary | ICD-10-CM

## 2018-02-20 DIAGNOSIS — D649 Anemia, unspecified: Secondary | ICD-10-CM

## 2018-02-20 DIAGNOSIS — C7951 Secondary malignant neoplasm of bone: Secondary | ICD-10-CM | POA: Insufficient documentation

## 2018-02-20 LAB — CBC WITH DIFFERENTIAL (CANCER CENTER ONLY)
Abs Immature Granulocytes: 0.02 10*3/uL (ref 0.00–0.07)
Basophils Absolute: 0 10*3/uL (ref 0.0–0.1)
Basophils Relative: 1 %
Eosinophils Absolute: 0.1 10*3/uL (ref 0.0–0.5)
Eosinophils Relative: 2 %
HCT: 24.2 % — ABNORMAL LOW (ref 39.0–52.0)
HEMOGLOBIN: 7.4 g/dL — AB (ref 13.0–17.0)
Immature Granulocytes: 0 %
Lymphocytes Relative: 18 %
Lymphs Abs: 0.9 10*3/uL (ref 0.7–4.0)
MCH: 30.8 pg (ref 26.0–34.0)
MCHC: 30.6 g/dL (ref 30.0–36.0)
MCV: 100.8 fL — ABNORMAL HIGH (ref 80.0–100.0)
Monocytes Absolute: 0.4 10*3/uL (ref 0.1–1.0)
Monocytes Relative: 9 %
NEUTROS ABS: 3.5 10*3/uL (ref 1.7–7.7)
Neutrophils Relative %: 70 %
Platelet Count: 279 10*3/uL (ref 150–400)
RBC: 2.4 MIL/uL — AB (ref 4.22–5.81)
RDW: 26.5 % — ABNORMAL HIGH (ref 11.5–15.5)
WBC Count: 4.9 10*3/uL (ref 4.0–10.5)
nRBC: 0.4 % — ABNORMAL HIGH (ref 0.0–0.2)

## 2018-02-20 LAB — SAMPLE TO BLOOD BANK

## 2018-02-20 NOTE — Progress Notes (Signed)
type

## 2018-02-20 NOTE — Telephone Encounter (Signed)
Type and cross order entered for blood bank

## 2018-02-20 NOTE — Telephone Encounter (Signed)
No note

## 2018-02-21 ENCOUNTER — Other Ambulatory Visit: Payer: Self-pay | Admitting: *Deleted

## 2018-02-21 DIAGNOSIS — C61 Malignant neoplasm of prostate: Secondary | ICD-10-CM

## 2018-02-21 DIAGNOSIS — D649 Anemia, unspecified: Secondary | ICD-10-CM

## 2018-02-21 LAB — PREPARE RBC (CROSSMATCH)

## 2018-02-22 ENCOUNTER — Ambulatory Visit (HOSPITAL_COMMUNITY)
Admission: RE | Admit: 2018-02-22 | Discharge: 2018-02-22 | Disposition: A | Discharge status: Home / Self Care | Payer: Medicare Other | Source: Ambulatory Visit | Attending: Oncology | Admitting: Oncology

## 2018-02-22 DIAGNOSIS — D649 Anemia, unspecified: Secondary | ICD-10-CM | POA: Diagnosis present

## 2018-02-22 MED ORDER — SODIUM CHLORIDE 0.9% IV SOLUTION
250.0000 mL | Freq: Once | INTRAVENOUS | Status: AC
  Administered 2018-02-22: 250 mL via INTRAVENOUS

## 2018-02-22 MED ORDER — DIPHENHYDRAMINE HCL 25 MG PO CAPS
25.0000 mg | ORAL_CAPSULE | Freq: Once | ORAL | Status: AC
  Administered 2018-02-22: 25 mg via ORAL
  Filled 2018-02-22: qty 1

## 2018-02-22 MED ORDER — ACETAMINOPHEN 325 MG PO TABS
650.0000 mg | ORAL_TABLET | Freq: Once | ORAL | Status: AC
  Administered 2018-02-22: 650 mg via ORAL
  Filled 2018-02-22: qty 2

## 2018-02-22 MED ORDER — SODIUM CHLORIDE 0.9% FLUSH: 3.0000 mL | INTRAVENOUS | Status: DC | PRN

## 2018-02-22 MED ORDER — HEPARIN SOD (PORK) LOCK FLUSH 100 UNIT/ML IV SOLN: 250.0000 [IU] | INTRAVENOUS | Status: DC | PRN

## 2018-02-22 NOTE — Discharge Instructions (Signed)
Blood Transfusion, Adult A blood transfusion is a procedure in which you are given blood through an IV tube. You may need this procedure because of:  Illness.  Surgery.  Injury. The blood may come from someone else (a donor). You may also be able to donate blood for yourself (autologous blood donation). The blood given in a transfusion is made up of different types of cells. You may get:  Red blood cells. These carry oxygen to the cells in the body.  White blood cells. These help you fight infections.  Platelets. These help your blood to clot.  Plasma. This is the liquid part of your blood. It helps with fluid imbalances. If you have a clotting disorder, you may also get other types of blood products. What happens before the procedure?  You will have a blood test to find out your blood type. The test also finds out what type of blood your body will accept and matches it to the donor type.  If you are going to have a planned surgery, you may be able to donate your own blood. This may be done in case you need a transfusion.  If you have had an allergic reaction to a transfusion in the past, you may be given medicine to help prevent a reaction. This medicine may be given to you by mouth or through an IV.  You will have your temperature, blood pressure, and pulse checked.  Follow instructions from your doctor about what you cannot eat or drink.  Ask your doctor about: ? Changing or stopping your regular medicines. This is important if you take diabetes medicines or blood thinners. ? Taking medicines such as aspirin and ibuprofen. These medicines can thin your blood. Do not take these medicines before your procedure if your doctor tells you not to. What happens during the procedure?  An IV tube will be put into one of your veins.  The bag of donated blood will be attached to your IV tube. Then, the blood will enter through your vein.  Your temperature, blood pressure, and pulse will  be checked regularly during the procedure. This is done to find early signs of a transfusion reaction.  If you have any signs or symptoms of a reaction, your transfusion will be stopped. You may also be given medicine.  When the transfusion is done, your IV tube will be taken out.  Pressure may be applied to the IV site for a few minutes.  A bandage (dressing) will be put on the IV site. The procedure may vary among doctors and hospitals. What happens after the procedure?  Your temperature, blood pressure, heart rate, breathing rate, and blood oxygen level will be checked often.  Your blood may be tested to see how you are responding to the transfusion.  You may be warmed with fluids or blankets. This is done to keep the temperature of your body normal. Summary  A blood transfusion is a procedure in which you are given blood through an IV tube.  The blood may come from someone else (a donor). You may also be able to donate blood for yourself.  If you have had an allergic reaction to a transfusion in the past, you may be given medicine to help prevent a reaction. This medicine may be given to you by mouth or through an IV tube.  Your temperature, blood pressure, heart rate, breathing rate, and blood oxygen level will be checked often.  Your blood may be tested to see   how you are responding to the transfusion. This information is not intended to replace advice given to you by your health care provider. Make sure you discuss any questions you have with your health care provider. Document Released: 03/24/2008 Document Revised: 08/20/2015 Document Reviewed: 08/20/2015 Elsevier Interactive Patient Education  2019 Elsevier Inc.  

## 2018-02-22 NOTE — Progress Notes (Signed)
PATIENT CARE CENTER NOTE  Diagnosis: Anemia    Provider: Dr. Alen Blew    Procedure: 2 units PRBC's    Note: Patient received 2 units of blood. Pre-medications, Tyleonl and Benadryl, given per order. Tolerated transfusion well with no adverse reaction. Vital signs stable. Discharge instructions given. Patient alert, oriented and ambulatory at discharge.

## 2018-02-25 LAB — TYPE AND SCREEN
ABO/RH(D): O POS
Antibody Screen: NEGATIVE
Unit division: 0
Unit division: 0

## 2018-02-25 LAB — BPAM RBC
Blood Product Expiration Date: 202003092359
Blood Product Expiration Date: 202003092359
ISSUE DATE / TIME: 202002140827
ISSUE DATE / TIME: 202002140827
Unit Type and Rh: 5100
Unit Type and Rh: 5100

## 2018-02-28 NOTE — Progress Notes (Signed)
POST OPERATIVE OFFICE NOTE    CC:  F/u for surgery  HPI:  This is a 85 y.o. male who is s/p RUA AVF on 02/06/2018 by Dr. Donzetta Matters.  He returns today for follow up.  He has other aneurysmal area on his fistula to evaluate for revision.  He states he has done well since surgery.  They are using his fistula without difficulty.  He states he did have some bleeding from the fistula this morning from the stick yesterday at the aneurysmal area.   No Known Allergies  Current Outpatient Medications  Medication Sig Dispense Refill  . amLODipine (NORVASC) 5 MG tablet Take 1 tablet (5 mg total) by mouth daily. (Patient taking differently: Take 5 mg by mouth every evening. ) 90 tablet 3  . aspirin EC 81 MG tablet Take 1 tablet (81 mg total) by mouth daily. (Patient taking differently: Take 81 mg by mouth every evening. ) 90 tablet 3  . atorvastatin (LIPITOR) 20 MG tablet Take 1 tablet (20 mg total) by mouth daily. (Patient taking differently: Take 20 mg by mouth every evening. ) 90 tablet 3  . B Complex-C-Folic Acid (DIALYVITE 540) 0.8 MG TABS Take 1 tablet by mouth every evening.   3  . calcium acetate (PHOSLO) 667 MG capsule Take 1-2 capsules (667-1,334 mg total) by mouth See admin instructions. Take 2 capsules (1334 mg) with meals and 1 capsule (667 mg) with snacks or a sandwich (Patient not taking: Reported on 01/28/2018) 180 capsule 1  . cinacalcet (SENSIPAR) 60 MG tablet Take 60 mg by mouth every Tuesday, Thursday, and Saturday at 6 PM.     . latanoprost (XALATAN) 0.005 % ophthalmic solution Place 1 drop into both eyes at bedtime.    Marland Kitchen leuprolide (LUPRON) 30 MG injection Inject 30 mg into the muscle every 4 (four) months.    Marland Kitchen oxyCODONE-acetaminophen (PERCOCET) 5-325 MG tablet Take 1 tablet by mouth every 6 (six) hours as needed for severe pain. 8 tablet 0  . pantoprazole (PROTONIX) 40 MG tablet Take 1 tablet (40 mg total) by mouth daily. (Patient not taking: Reported on 01/28/2018) 30 tablet 1  . XTANDI  40 MG capsule TAKE 4 CAPSULES (160 MG TOTAL) BY MOUTH DAILY. 120 capsule 0   No current facility-administered medications for this visit.      ROS:  See HPI  Physical Exam:  Today's Vitals   03/01/18 1215  BP: (!) 122/54  Pulse: 73  Resp: 16  Temp: 98 F (36.7 C)  TempSrc: Oral  SpO2: 100%  Weight: 177 lb 9.3 oz (80.6 kg)  Height: 6' (1.829 m)   Body mass index is 24.08 kg/m.   Incision:  Well healed Extremities:  Unable to palpate right radial pulse; there is an excellent thrill within the fistula.  Small scab on aneurysmal area; bandage in place over needle hole with some bloody drainage.    Assessment/Plan:  This is a 85 y.o. male who is s/p:  RUA AVF on 02/06/2018 by Dr. Donzetta Matters.  -aneurysmal area that was repaired looks good.  He has another aneurysmal area that has a small scab and the area where he was stuck yesterday still had some bleeding this morning.   -Dr. Donzetta Matters evaluate pt and we will plan to repair this aneurysmal area at his earliest convienience.   He is not on any AC -would continue to stick above repair for another month and then may start sticking area that was repaired.  -he dialyzes T/T/S the  center on [address].    [name], PA-C [facility] [phone]  Clinic MD:  Donzetta Matters

## 2018-03-01 ENCOUNTER — Encounter: Payer: Self-pay | Admitting: *Deleted

## 2018-03-01 ENCOUNTER — Other Ambulatory Visit: Payer: Self-pay | Admitting: *Deleted

## 2018-03-01 ENCOUNTER — Ambulatory Visit (INDEPENDENT_AMBULATORY_CARE_PROVIDER_SITE_OTHER): Payer: Self-pay | Admitting: Physician Assistant

## 2018-03-01 ENCOUNTER — Encounter: Payer: Self-pay | Admitting: Physician Assistant

## 2018-03-01 ENCOUNTER — Other Ambulatory Visit: Payer: Self-pay

## 2018-03-01 VITALS — BP 122/54 | HR 73 | Temp 98.0°F | Resp 16 | Ht 72.0 in | Wt 177.6 lb

## 2018-03-01 DIAGNOSIS — Z992 Dependence on renal dialysis: Secondary | ICD-10-CM

## 2018-03-01 DIAGNOSIS — N186 End stage renal disease: Secondary | ICD-10-CM

## 2018-03-04 ENCOUNTER — Other Ambulatory Visit: Payer: Self-pay | Admitting: Oncology

## 2018-03-04 DIAGNOSIS — C61 Malignant neoplasm of prostate: Secondary | ICD-10-CM

## 2018-03-04 MED ORDER — PALONOSETRON HCL INJECTION 0.25 MG/5ML
INTRAVENOUS | Status: AC
  Filled 2018-03-04: qty 5

## 2018-03-04 MED ORDER — DIPHENHYDRAMINE HCL 50 MG/ML IJ SOLN
INTRAMUSCULAR | Status: AC
  Filled 2018-03-04: qty 1

## 2018-03-04 MED ORDER — DIPHENHYDRAMINE HCL 25 MG PO CAPS
ORAL_CAPSULE | ORAL | Status: AC
  Filled 2018-03-04: qty 2

## 2018-03-12 MED FILL — XTANDI 40 MG CAPSULE: 40 | 30 days supply | Qty: 120 | Fill #0

## 2018-03-15 ENCOUNTER — Telehealth: Payer: Self-pay

## 2018-03-15 ENCOUNTER — Telehealth: Payer: Self-pay | Admitting: *Deleted

## 2018-03-15 NOTE — Telephone Encounter (Signed)
Received message forwarded from triage from the patient dialysis center. This RN returned call and spoke with Eudelia Bunch RN at the [facility] at [phone]. Lelon Frohlich stated that the patient Hbg is 6.4 and receives his transfusions through the [facility]. Lelon Frohlich is also faxing the results. Communicated to Dr. Alen Blew and received verbal order for 2 units PRBC's. Coordinated with scheduling and contacted the patient. Spoke with patient spouse and communicated lab and transfusion appointments with read back. She had no other questions or concerns and also understands that the patient will need to leave the blue bracelet on until he receives the transfusion. She verbalized understanding and had no other questions or concerns.

## 2018-03-15 NOTE — Telephone Encounter (Addendum)
"  [name] [name], [facility] [phone] ext [phone]).  [facility] patient Ingvald Theisen hemoglobin has dropped again.  He receives blood transfusions there.  Call me, he needs to be set up for transfusion.  Hgb = 6.4."

## 2018-03-18 ENCOUNTER — Other Ambulatory Visit: Payer: Self-pay

## 2018-03-18 DIAGNOSIS — D649 Anemia, unspecified: Secondary | ICD-10-CM

## 2018-03-20 ENCOUNTER — Other Ambulatory Visit: Payer: Self-pay

## 2018-03-20 ENCOUNTER — Other Ambulatory Visit: Payer: Self-pay | Admitting: *Deleted

## 2018-03-20 ENCOUNTER — Telehealth: Payer: Self-pay | Admitting: *Deleted

## 2018-03-20 ENCOUNTER — Inpatient Hospital Stay: Payer: Medicare Other | Attending: Oncology

## 2018-03-20 DIAGNOSIS — D638 Anemia in other chronic diseases classified elsewhere: Secondary | ICD-10-CM | POA: Insufficient documentation

## 2018-03-20 DIAGNOSIS — D509 Iron deficiency anemia, unspecified: Secondary | ICD-10-CM | POA: Insufficient documentation

## 2018-03-20 DIAGNOSIS — N289 Disorder of kidney and ureter, unspecified: Secondary | ICD-10-CM | POA: Diagnosis not present

## 2018-03-20 DIAGNOSIS — C7951 Secondary malignant neoplasm of bone: Secondary | ICD-10-CM | POA: Diagnosis not present

## 2018-03-20 DIAGNOSIS — E291 Testicular hypofunction: Secondary | ICD-10-CM | POA: Insufficient documentation

## 2018-03-20 DIAGNOSIS — N19 Unspecified kidney failure: Secondary | ICD-10-CM | POA: Diagnosis not present

## 2018-03-20 DIAGNOSIS — C61 Malignant neoplasm of prostate: Secondary | ICD-10-CM | POA: Insufficient documentation

## 2018-03-20 DIAGNOSIS — D649 Anemia, unspecified: Secondary | ICD-10-CM | POA: Diagnosis not present

## 2018-03-20 DIAGNOSIS — Z992 Dependence on renal dialysis: Secondary | ICD-10-CM | POA: Diagnosis not present

## 2018-03-20 LAB — CBC WITH DIFFERENTIAL (CANCER CENTER ONLY)
Abs Immature Granulocytes: 0.01 10*3/uL (ref 0.00–0.07)
Basophils Absolute: 0 10*3/uL (ref 0.0–0.1)
Basophils Relative: 0 %
Eosinophils Absolute: 0.1 10*3/uL (ref 0.0–0.5)
Eosinophils Relative: 3 %
HCT: 22.8 % — ABNORMAL LOW (ref 39.0–52.0)
HEMOGLOBIN: 6.8 g/dL — AB (ref 13.0–17.0)
Immature Granulocytes: 0 %
Lymphocytes Relative: 17 %
Lymphs Abs: 0.8 10*3/uL (ref 0.7–4.0)
MCH: 31.1 pg (ref 26.0–34.0)
MCHC: 29.8 g/dL — ABNORMAL LOW (ref 30.0–36.0)
MCV: 104.1 fL — ABNORMAL HIGH (ref 80.0–100.0)
MONO ABS: 0.4 10*3/uL (ref 0.1–1.0)
Monocytes Relative: 8 %
Neutro Abs: 3.7 10*3/uL (ref 1.7–7.7)
Neutrophils Relative %: 72 %
Platelet Count: 278 10*3/uL (ref 150–400)
RBC: 2.19 MIL/uL — ABNORMAL LOW (ref 4.22–5.81)
RDW: 20.4 % — ABNORMAL HIGH (ref 11.5–15.5)
WBC Count: 5.1 10*3/uL (ref 4.0–10.5)
nRBC: 0 % (ref 0.0–0.2)

## 2018-03-20 LAB — PREPARE RBC (CROSSMATCH)

## 2018-03-20 NOTE — Telephone Encounter (Signed)
Released type and screen and prepare p-rbc's

## 2018-03-22 ENCOUNTER — Inpatient Hospital Stay: Payer: Medicare Other

## 2018-03-22 ENCOUNTER — Other Ambulatory Visit: Payer: Self-pay

## 2018-03-22 DIAGNOSIS — C61 Malignant neoplasm of prostate: Secondary | ICD-10-CM | POA: Diagnosis not present

## 2018-03-22 DIAGNOSIS — D649 Anemia, unspecified: Secondary | ICD-10-CM

## 2018-03-22 MED ORDER — DIPHENHYDRAMINE HCL 25 MG PO CAPS
ORAL_CAPSULE | ORAL | Status: AC
  Filled 2018-03-22: qty 1

## 2018-03-22 MED ORDER — ACETAMINOPHEN 325 MG PO TABS
ORAL_TABLET | ORAL | Status: AC
  Filled 2018-03-22: qty 2

## 2018-03-22 MED ORDER — SODIUM CHLORIDE 0.9% IV SOLUTION
250.0000 mL | Freq: Once | INTRAVENOUS | Status: AC
  Administered 2018-03-22: 250 mL via INTRAVENOUS
  Filled 2018-03-22: qty 250

## 2018-03-22 MED ORDER — DIPHENHYDRAMINE HCL 25 MG PO CAPS
25.0000 mg | ORAL_CAPSULE | Freq: Once | ORAL | Status: AC
  Administered 2018-03-22: 25 mg via ORAL

## 2018-03-22 MED ORDER — ACETAMINOPHEN 325 MG PO TABS
650.0000 mg | ORAL_TABLET | Freq: Once | ORAL | Status: AC
  Administered 2018-03-22: 650 mg via ORAL

## 2018-03-22 NOTE — Patient Instructions (Signed)

## 2018-03-25 LAB — BPAM RBC
Blood Product Expiration Date: 202004092359
Blood Product Expiration Date: 202004092359
ISSUE DATE / TIME: 202003130834
ISSUE DATE / TIME: 202003130834
UNIT TYPE AND RH: 5100
Unit Type and Rh: 5100

## 2018-03-25 LAB — TYPE AND SCREEN
ABO/RH(D): O POS
ANTIBODY SCREEN: NEGATIVE
UNIT DIVISION: 0
Unit division: 0

## 2018-03-26 ENCOUNTER — Telehealth: Payer: Self-pay

## 2018-03-26 NOTE — Telephone Encounter (Signed)
Left message regarding COVID-19 screening prior to next day appointment and if positive to any of the screening questions to call back. Will be screened at the front desk prior to registering for appointment the next day.

## 2018-03-27 ENCOUNTER — Inpatient Hospital Stay: Payer: Medicare Other

## 2018-03-27 ENCOUNTER — Other Ambulatory Visit: Payer: Self-pay

## 2018-03-27 ENCOUNTER — Inpatient Hospital Stay (HOSPITAL_BASED_OUTPATIENT_CLINIC_OR_DEPARTMENT_OTHER): Payer: Medicare Other | Admitting: Oncology

## 2018-03-27 ENCOUNTER — Telehealth: Payer: Self-pay | Admitting: Oncology

## 2018-03-27 VITALS — BP 113/50 | HR 79 | Temp 98.1°F | Resp 18 | Ht 72.0 in | Wt 173.3 lb

## 2018-03-27 DIAGNOSIS — Z9981 Dependence on supplemental oxygen: Secondary | ICD-10-CM

## 2018-03-27 DIAGNOSIS — C7951 Secondary malignant neoplasm of bone: Secondary | ICD-10-CM | POA: Diagnosis not present

## 2018-03-27 DIAGNOSIS — C61 Malignant neoplasm of prostate: Secondary | ICD-10-CM

## 2018-03-27 DIAGNOSIS — N289 Disorder of kidney and ureter, unspecified: Secondary | ICD-10-CM

## 2018-03-27 DIAGNOSIS — E291 Testicular hypofunction: Secondary | ICD-10-CM

## 2018-03-27 DIAGNOSIS — Z7189 Other specified counseling: Secondary | ICD-10-CM

## 2018-03-27 DIAGNOSIS — D649 Anemia, unspecified: Secondary | ICD-10-CM | POA: Diagnosis not present

## 2018-03-27 DIAGNOSIS — N19 Unspecified kidney failure: Secondary | ICD-10-CM

## 2018-03-27 DIAGNOSIS — D638 Anemia in other chronic diseases classified elsewhere: Secondary | ICD-10-CM | POA: Diagnosis not present

## 2018-03-27 DIAGNOSIS — D509 Iron deficiency anemia, unspecified: Secondary | ICD-10-CM

## 2018-03-27 LAB — IRON AND TIBC
Iron: 72 ug/dL (ref 42–163)
Saturation Ratios: 21 % (ref 20–55)
TIBC: 345 ug/dL (ref 202–409)
UIBC: 273 ug/dL (ref 117–376)

## 2018-03-27 LAB — CBC WITH DIFFERENTIAL (CANCER CENTER ONLY)
Abs Immature Granulocytes: 0.01 10*3/uL (ref 0.00–0.07)
Basophils Absolute: 0 10*3/uL (ref 0.0–0.1)
Basophils Relative: 0 %
Eosinophils Absolute: 0.1 10*3/uL (ref 0.0–0.5)
Eosinophils Relative: 3 %
HEMATOCRIT: 31 % — AB (ref 39.0–52.0)
Hemoglobin: 9.7 g/dL — ABNORMAL LOW (ref 13.0–17.0)
Immature Granulocytes: 0 %
Lymphocytes Relative: 15 %
Lymphs Abs: 0.8 10*3/uL (ref 0.7–4.0)
MCH: 31 pg (ref 26.0–34.0)
MCHC: 31.3 g/dL (ref 30.0–36.0)
MCV: 99 fL (ref 80.0–100.0)
MONOS PCT: 10 %
Monocytes Absolute: 0.5 10*3/uL (ref 0.1–1.0)
Neutro Abs: 3.6 10*3/uL (ref 1.7–7.7)
Neutrophils Relative %: 72 %
Platelet Count: 264 10*3/uL (ref 150–400)
RBC: 3.13 MIL/uL — ABNORMAL LOW (ref 4.22–5.81)
RDW: 17.5 % — AB (ref 11.5–15.5)
WBC Count: 5.1 10*3/uL (ref 4.0–10.5)
nRBC: 0 % (ref 0.0–0.2)

## 2018-03-27 LAB — CMP (CANCER CENTER ONLY)
ALT: 9 U/L (ref 0–44)
AST: 15 U/L (ref 15–41)
Albumin: 2.5 g/dL — ABNORMAL LOW (ref 3.5–5.0)
Alkaline Phosphatase: 92 U/L (ref 38–126)
Anion gap: 14 (ref 5–15)
BUN: 25 mg/dL — AB (ref 8–23)
CO2: 27 mmol/L (ref 22–32)
CREATININE: 3.92 mg/dL — AB (ref 0.61–1.24)
Calcium: 7.9 mg/dL — ABNORMAL LOW (ref 8.9–10.3)
Chloride: 101 mmol/L (ref 98–111)
GFR, Est AFR Am: 15 mL/min — ABNORMAL LOW (ref >60)
GFR, Estimated: 13 mL/min — ABNORMAL LOW (ref >60)
Glucose, Bld: 88 mg/dL (ref 70–99)
POTASSIUM: 3.5 mmol/L (ref 3.5–5.1)
Sodium: 142 mmol/L (ref 135–145)
Total Bilirubin: 0.6 mg/dL (ref 0.3–1.2)
Total Protein: 6.4 g/dL — ABNORMAL LOW (ref 6.5–8.1)

## 2018-03-27 LAB — FERRITIN: Ferritin: 182 ng/mL (ref 24–336)

## 2018-03-27 LAB — SAMPLE TO BLOOD BANK

## 2018-03-27 NOTE — Telephone Encounter (Signed)
[place] appt per  03/18 los.  Printed calendar and avs.

## 2018-03-27 NOTE — Progress Notes (Signed)
Hematology and Oncology Follow Up Visit  Adnan Vanvoorhis [id number] Jul 20, 1930 85 y.o. 03/27/2018 10:05 AM [name], MDElkins, [name], *   Principle Diagnosis: 85 year old man with:  1.  Advanced prostate cancer diagnosed in 2017.  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    2.  Anemia: Multifactorial in nature related to malignancy and chronically insufficiency.  Prior Therapy: Androgen deprivation therapy and he remains on it under the care of Dr. Diona Fanti. His PSA decreased to 31 in 2017. His PSA in November 2017 was 41 and in March 2018 was 29.    Current therapy:   Xtandi 160 mg daily started in March 2019.  Packed red cell transfusion and IV iron as needed.  Interim History: Mr. Ziegler is here for a follow-up visit.  Since last visit, he continues to tolerate Xtandi without any major complaints.  He denies any new side effects or complications related to it.  He denies any worsening edema or hypertension.  He did require 2 units of packed red cells earlier this week after hemoglobin dropping to 6.8.  After transfusion, he feels well and ambulating without any difficulties.  He denies any recent falls, syncope or excessive fatigue.  He is ambulating without the help of a cane at this time.  Denies any dyspnea exertion or recent illness.  Patient denied any alteration mental status, neuropathy, confusion or dizziness.  Denies any headaches or lethargy.  Denies any night sweats, weight loss or changes in appetite.  Denied orthopnea, dyspnea on exertion or chest discomfort.  Denies shortness of breath, difficulty breathing hemoptysis or cough.  Denies any abdominal distention, nausea, early satiety or dyspepsia.  Denies any hematuria, frequency, dysuria or nocturia.  Denies any skin irritation, dryness or rash.  Denies any ecchymosis or petechiae.  Denies any lymphadenopathy or  clotting.  Denies any heat or cold intolerance.  Denies any anxiety or depression.  Remaining review of system is negative.  .    Medications: I have reviewed the patient's current medications.  Current Outpatient Medications  Medication Sig Dispense Refill  . amLODipine (NORVASC) 5 MG tablet Take 1 tablet (5 mg total) by mouth daily. (Patient taking differently: Take 5 mg by mouth every evening. ) 90 tablet 3  . aspirin EC 81 MG tablet Take 1 tablet (81 mg total) by mouth daily. (Patient taking differently: Take 81 mg by mouth every evening. ) 90 tablet 3  . atorvastatin (LIPITOR) 20 MG tablet Take 1 tablet (20 mg total) by mouth daily. (Patient taking differently: Take 20 mg by mouth every evening. ) 90 tablet 3  . B Complex-C-Folic Acid (DIALYVITE 786) 0.8 MG TABS Take 1 tablet by mouth every evening.   3  . calcium acetate (PHOSLO) 667 MG capsule Take 1-2 capsules (667-1,334 mg total) by mouth See admin instructions. Take 2 capsules (1334 mg) with meals and 1 capsule (667 mg) with snacks or a sandwich 180 capsule 1  . cinacalcet (SENSIPAR) 60 MG tablet Take 60 mg by mouth every Tuesday, Thursday, and Saturday at 6 PM.     . latanoprost (XALATAN) 0.005 % ophthalmic solution Place 1 drop into both eyes at bedtime.    Marland Kitchen leuprolide (LUPRON) 30 MG injection Inject 30 mg into the muscle every 4 (four) months.    Marland Kitchen oxyCODONE-acetaminophen (PERCOCET) 5-325 MG tablet Take 1 tablet by mouth every 6 (six) hours as needed for severe pain. 8  tablet 0  . pantoprazole (PROTONIX) 40 MG tablet Take 1 tablet (40 mg total) by mouth daily. 30 tablet 1  . XTANDI 40 MG capsule TAKE 4 CAPSULES (160 MG TOTAL) BY MOUTH DAILY. 120 capsule 0   No current facility-administered medications for this visit.      Allergies: No Known Allergies  Past Medical History, Surgical history, Social history, and Family History were reviewed and updated.  Physical Exam:   Blood pressure (!) 113/50, pulse 79, temperature  98.1 F (36.7 C), temperature source Oral, resp. rate 18, height 6' (1.829 m), weight 173 lb 4.8 oz (78.6 kg), SpO2 100 %.     ECOG: 1    General appearance: Comfortable appearing without any discomfort Head: Normocephalic without any trauma Oropharynx: Mucous membranes are moist and pink without any thrush or ulcers. Eyes: Pupils are equal and round reactive to light. Lymph nodes: No cervical, supraclavicular, inguinal or axillary lymphadenopathy.   Heart:regular rate and rhythm.  S1 and S2 without leg edema. Lung: Clear without any rhonchi or wheezes.  No dullness to percussion. Abdomin: Soft, nontender, nondistended with good bowel sounds.  No hepatosplenomegaly. Musculoskeletal: No joint deformity or effusion.  Full range of motion noted. Neurological: No deficits noted on motor, sensory and deep tendon reflex exam. Skin: No petechial rash or dryness.  Appeared moist.         Lab Results: Lab Results  Component Value Date   WBC 5.1 03/27/2018   HGB 9.7 (L) 03/27/2018   HCT 31.0 (L) 03/27/2018   MCV 99.0 03/27/2018   PLT 264 03/27/2018     Chemistry      Component Value Date/Time   NA 139 02/06/2018 0624   NA 143 09/29/2016 0810   K 3.8 02/06/2018 0624   K 4.3 09/29/2016 0810   CL 101 12/19/2017 0936   CO2 27 12/19/2017 0936   CO2 31 (H) 09/29/2016 0810   BUN 20 12/19/2017 0936   BUN 27.9 (H) 09/29/2016 0810   CREATININE 3.74 (HH) 12/19/2017 0936   CREATININE 7.4 (HH) 09/29/2016 0810      Component Value Date/Time   CALCIUM 9.0 12/19/2017 0936   CALCIUM 9.9 09/29/2016 0810   ALKPHOS 123 12/19/2017 0936   ALKPHOS 141 09/29/2016 0810   AST 16 12/19/2017 0936   AST 21 09/29/2016 0810   ALT 6 12/19/2017 0936   ALT 10 09/29/2016 0810   BILITOT 0.8 12/19/2017 0936   BILITOT 0.48 09/29/2016 0810         Results for SAINT, HANK (MRN [id number]) as of 03/27/2018 09:43  Ref. Range 10/31/2017 08:47 12/19/2017 09:36  Prostate Specific Ag, Serum Latest Ref  Range: 0.0 - 4.0 ng/mL 0.4 0.3         Impression and Plan:   85 year old man with:  1.  Advanced prostate cancer with disease to the bone and adenopathy that is currently castration-resistant in 2019.Marland Kitchen   He is on Xtandi without any recent complaints or issues.  His PSA continues to decline at this time.  Risks and benefits of continuing this therapy long-term was discussed today.  Long-term complications including excessive fatigue, edema and hypertension.  After discussion he is agreeable to continue.  Alternative therapy were also reviewed which include systemic chemotherapy.  2. Androgen deprivation therapy: I recommended continuing androgen deprivation indefinitely.  He continues to receive that under the care of Dr. Diona Fanti.   3.  Anemia: Multifactorial in nature related to chronic disease, renal insufficiency as well as iron deficiency.  His hemoglobin is adequate today and iron studies are currently pending.  He did receive iron infusion in January 2020.  We will continue to monitor his counts periodically and supplement his iron stores and transfuse as needed.  4.  Prognosis and goals of care: Therapy remains palliative at this time and aggressive therapy is warranted.  5.  Renal failure: Continues to be dialysis dependent at this time.  No recent issues or hospitalizations.  6. Follow-up: In 2 months to follow his progress..  25  minutes was spent with the patient face-to-face today.  More than 50% of time was spent on reviewing the natural course of disease, treatment options and answering questions regarding future plan of care.  Zola Button, MD 3/18/202010:05 AM

## 2018-03-28 LAB — PROSTATE-SPECIFIC AG, SERUM (LABCORP): Prostate Specific Ag, Serum: 0.1 ng/mL (ref 0.0–4.0)

## 2018-04-01 ENCOUNTER — Other Ambulatory Visit: Payer: Self-pay | Admitting: *Deleted

## 2018-04-02 ENCOUNTER — Encounter (HOSPITAL_COMMUNITY): Payer: Self-pay | Admitting: *Deleted

## 2018-04-02 ENCOUNTER — Other Ambulatory Visit: Payer: Self-pay

## 2018-04-02 NOTE — Progress Notes (Signed)
Mr Blowe asked me to speak to Mrs Obryant with him in the room to answer questions if needed.  Mr Quast has not had any chest pain, denies chest pain.  PCP is Dr Arelia Sneddon.  Mr Hughett has Type II DM, diet controlled, does not check CBG. Mr and Mrs Arana are aware that no one is allowed to come in the hospital except Mr. Alger Simons

## 2018-04-03 ENCOUNTER — Encounter (HOSPITAL_COMMUNITY): Payer: Self-pay

## 2018-04-03 ENCOUNTER — Ambulatory Visit (HOSPITAL_COMMUNITY)
Admission: RE | Admit: 2018-04-03 | Discharge: 2018-04-03 | Disposition: A | Discharge status: Home / Self Care | Payer: Medicare Other | Attending: Vascular Surgery | Admitting: Vascular Surgery

## 2018-04-03 ENCOUNTER — Encounter (HOSPITAL_COMMUNITY)
Admission: RE | Disposition: A | Discharge status: Home / Self Care | Payer: Self-pay | Source: Home / Self Care | Attending: Vascular Surgery

## 2018-04-03 ENCOUNTER — Ambulatory Visit (HOSPITAL_COMMUNITY): Payer: Medicare Other | Admitting: Certified Registered"

## 2018-04-03 DIAGNOSIS — T82898A Other specified complication of vascular prosthetic devices, implants and grafts, initial encounter: Secondary | ICD-10-CM | POA: Insufficient documentation

## 2018-04-03 DIAGNOSIS — N186 End stage renal disease: Secondary | ICD-10-CM

## 2018-04-03 DIAGNOSIS — I120 Hypertensive chronic kidney disease with stage 5 chronic kidney disease or end stage renal disease: Secondary | ICD-10-CM | POA: Diagnosis not present

## 2018-04-03 DIAGNOSIS — Z992 Dependence on renal dialysis: Secondary | ICD-10-CM | POA: Insufficient documentation

## 2018-04-03 DIAGNOSIS — I252 Old myocardial infarction: Secondary | ICD-10-CM | POA: Diagnosis not present

## 2018-04-03 DIAGNOSIS — Z7982 Long term (current) use of aspirin: Secondary | ICD-10-CM | POA: Diagnosis not present

## 2018-04-03 DIAGNOSIS — Z951 Presence of aortocoronary bypass graft: Secondary | ICD-10-CM | POA: Diagnosis not present

## 2018-04-03 DIAGNOSIS — K219 Gastro-esophageal reflux disease without esophagitis: Secondary | ICD-10-CM | POA: Diagnosis not present

## 2018-04-03 DIAGNOSIS — E1122 Type 2 diabetes mellitus with diabetic chronic kidney disease: Secondary | ICD-10-CM | POA: Diagnosis not present

## 2018-04-03 DIAGNOSIS — Y832 Surgical operation with anastomosis, bypass or graft as the cause of abnormal reaction of the patient, or of later complication, without mention of misadventure at the time of the procedure: Secondary | ICD-10-CM | POA: Diagnosis not present

## 2018-04-03 DIAGNOSIS — I2510 Atherosclerotic heart disease of native coronary artery without angina pectoris: Secondary | ICD-10-CM | POA: Insufficient documentation

## 2018-04-03 DIAGNOSIS — Z87891 Personal history of nicotine dependence: Secondary | ICD-10-CM | POA: Insufficient documentation

## 2018-04-03 DIAGNOSIS — Z79899 Other long term (current) drug therapy: Secondary | ICD-10-CM | POA: Diagnosis not present

## 2018-04-03 HISTORY — PX: REVISON OF ARTERIOVENOUS FISTULA: SHX6074

## 2018-04-03 LAB — POCT I-STAT 4, (NA,K, GLUC, HGB,HCT)
Glucose, Bld: 85 mg/dL (ref 70–99)
HCT: 28 % — ABNORMAL LOW (ref 39.0–52.0)
Hemoglobin: 9.5 g/dL — ABNORMAL LOW (ref 13.0–17.0)
Potassium: 3.5 mmol/L (ref 3.5–5.1)
SODIUM: 137 mmol/L (ref 135–145)

## 2018-04-03 LAB — GLUCOSE, CAPILLARY: Glucose-Capillary: 97 mg/dL (ref 70–99)

## 2018-04-03 SURGERY — REVISON OF ARTERIOVENOUS FISTULA
Anesthesia: General | Laterality: Right

## 2018-04-03 MED ORDER — ONDANSETRON HCL 4 MG/2ML IJ SOLN
INTRAMUSCULAR | Status: DC | PRN
  Administered 2018-04-03: 4 mg via INTRAVENOUS

## 2018-04-03 MED ORDER — DEXAMETHASONE SODIUM PHOSPHATE 10 MG/ML IJ SOLN
INTRAMUSCULAR | Status: DC | PRN
  Administered 2018-04-03: 5 mg via INTRAVENOUS

## 2018-04-03 MED ORDER — PROPOFOL 10 MG/ML IV BOLUS
INTRAVENOUS | Status: AC
  Filled 2018-04-03: qty 20

## 2018-04-03 MED ORDER — SODIUM CHLORIDE 0.9 % IV SOLN
INTRAVENOUS | Status: AC
  Filled 2018-04-03: qty 1.2

## 2018-04-03 MED ORDER — SODIUM CHLORIDE 0.9 % IV SOLN
INTRAVENOUS | Status: DC | PRN
  Administered 2018-04-03: 08:00:00

## 2018-04-03 MED ORDER — PROPOFOL 10 MG/ML IV BOLUS
INTRAVENOUS | Status: DC | PRN
  Administered 2018-04-03: 100 mg via INTRAVENOUS
  Administered 2018-04-03: 40 mg via INTRAVENOUS

## 2018-04-03 MED ORDER — SODIUM CHLORIDE 0.9 % IV SOLN
INTRAVENOUS | Status: DC | PRN
  Administered 2018-04-03: 50 ug/min via INTRAVENOUS

## 2018-04-03 MED ORDER — ONDANSETRON HCL 4 MG/2ML IJ SOLN
INTRAMUSCULAR | Status: AC
  Filled 2018-04-03: qty 2

## 2018-04-03 MED ORDER — PHENYLEPHRINE 40 MCG/ML (10ML) SYRINGE FOR IV PUSH (FOR BLOOD PRESSURE SUPPORT)
PREFILLED_SYRINGE | INTRAVENOUS | Status: AC
  Filled 2018-04-03: qty 10

## 2018-04-03 MED ORDER — SODIUM CHLORIDE 0.9 % IR SOLN
Status: DC | PRN
  Administered 2018-04-03: 1000 mL

## 2018-04-03 MED ORDER — LIDOCAINE-EPINEPHRINE 0.5 %-1:200000 IJ SOLN
INTRAMUSCULAR | Status: AC
  Filled 2018-04-03: qty 1

## 2018-04-03 MED ORDER — EPHEDRINE 5 MG/ML INJ
INTRAVENOUS | Status: AC
  Filled 2018-04-03: qty 10

## 2018-04-03 MED ORDER — PHENYLEPHRINE 40 MCG/ML (10ML) SYRINGE FOR IV PUSH (FOR BLOOD PRESSURE SUPPORT)
PREFILLED_SYRINGE | INTRAVENOUS | Status: DC | PRN
  Administered 2018-04-03: 80 ug via INTRAVENOUS
  Administered 2018-04-03: 120 ug via INTRAVENOUS

## 2018-04-03 MED ORDER — FENTANYL CITRATE (PF) 250 MCG/5ML IJ SOLN
INTRAMUSCULAR | Status: DC | PRN
  Administered 2018-04-03: 25 ug via INTRAVENOUS

## 2018-04-03 MED ORDER — OXYCODONE-ACETAMINOPHEN 5-325 MG PO TABS: 1.0000 | ORAL_TABLET | Freq: Four times a day (QID) | ORAL | 0 refills | Status: DC | PRN

## 2018-04-03 MED ORDER — LIDOCAINE 2% (20 MG/ML) 5 ML SYRINGE
INTRAMUSCULAR | Status: DC | PRN
  Administered 2018-04-03: 60 mg via INTRAVENOUS

## 2018-04-03 MED ORDER — EPHEDRINE SULFATE-NACL 50-0.9 MG/10ML-% IV SOSY
PREFILLED_SYRINGE | INTRAVENOUS | Status: DC | PRN
  Administered 2018-04-03: 15 mg via INTRAVENOUS
  Administered 2018-04-03: 10 mg via INTRAVENOUS

## 2018-04-03 MED ORDER — SODIUM CHLORIDE 0.9 % IV SOLN
INTRAVENOUS | Status: DC
  Administered 2018-04-03: 07:00:00 via INTRAVENOUS

## 2018-04-03 MED ORDER — FENTANYL CITRATE (PF) 250 MCG/5ML IJ SOLN
INTRAMUSCULAR | Status: AC
  Filled 2018-04-03: qty 5

## 2018-04-03 MED ORDER — DEXAMETHASONE SODIUM PHOSPHATE 10 MG/ML IJ SOLN
INTRAMUSCULAR | Status: AC
  Filled 2018-04-03: qty 1

## 2018-04-03 MED ORDER — CEFAZOLIN SODIUM-DEXTROSE 2-4 GM/100ML-% IV SOLN
2.0000 g | INTRAVENOUS | Status: AC
  Administered 2018-04-03: 2 g via INTRAVENOUS
  Filled 2018-04-03: qty 100

## 2018-04-03 SURGICAL SUPPLY — 29 items
ADH SKN CLS APL DERMABOND .7 (GAUZE/BANDAGES/DRESSINGS) ×1
ARMBAND PINK RESTRICT EXTREMIT (MISCELLANEOUS) ×3 IMPLANT
CANISTER SUCT 3000ML PPV (MISCELLANEOUS) ×3 IMPLANT
CANNULA VESSEL 3MM 2 BLNT TIP (CANNULA) ×3 IMPLANT
CLIP LIGATING EXTRA MED SLVR (CLIP) ×3 IMPLANT
CLIP LIGATING EXTRA SM BLUE (MISCELLANEOUS) ×3 IMPLANT
COVER PROBE W GEL 5X96 (DRAPES) IMPLANT
COVER WAND RF STERILE (DRAPES) ×1 IMPLANT
DECANTER SPIKE VIAL GLASS SM (MISCELLANEOUS) ×1 IMPLANT
DERMABOND ADVANCED (GAUZE/BANDAGES/DRESSINGS) ×2
DERMABOND ADVANCED .7 DNX12 (GAUZE/BANDAGES/DRESSINGS) ×1 IMPLANT
ELECT REM PT RETURN 9FT ADLT (ELECTROSURGICAL) ×3
ELECTRODE REM PT RTRN 9FT ADLT (ELECTROSURGICAL) ×1 IMPLANT
GLOVE SS BIOGEL STRL SZ 7.5 (GLOVE) ×1 IMPLANT
GLOVE SUPERSENSE BIOGEL SZ 7.5 (GLOVE) ×2
GOWN STRL REUS W/ TWL LRG LVL3 (GOWN DISPOSABLE) ×3 IMPLANT
GOWN STRL REUS W/TWL LRG LVL3 (GOWN DISPOSABLE) ×9
KIT BASIN OR (CUSTOM PROCEDURE TRAY) ×3 IMPLANT
KIT TURNOVER KIT B (KITS) ×3 IMPLANT
NS IRRIG 1000ML POUR BTL (IV SOLUTION) ×3 IMPLANT
PACK CV ACCESS (CUSTOM PROCEDURE TRAY) ×3 IMPLANT
PAD ARMBOARD 7.5X6 YLW CONV (MISCELLANEOUS) ×6 IMPLANT
SUT PROLENE 5 0 C 1 24 (SUTURE) ×2 IMPLANT
SUT PROLENE 6 0 CC (SUTURE) ×1 IMPLANT
SUT VIC AB 3-0 SH 27 (SUTURE) ×3
SUT VIC AB 3-0 SH 27X BRD (SUTURE) ×1 IMPLANT
TOWEL GREEN STERILE (TOWEL DISPOSABLE) ×3 IMPLANT
UNDERPAD 30X30 (UNDERPADS AND DIAPERS) ×3 IMPLANT
WATER STERILE IRR 1000ML POUR (IV SOLUTION) ×3 IMPLANT

## 2018-04-03 NOTE — Anesthesia Preprocedure Evaluation (Signed)
Anesthesia Evaluation  Patient identified by MRN, date of birth, ID band Patient awake    Reviewed: Allergy & Precautions, NPO status , Patient's Chart, lab work & pertinent test results  History of Anesthesia Complications Negative for: history of anesthetic complications  Airway Mallampati: II  TM Distance: >3 FB Neck ROM: Full    Dental  (+) Dental Advisory Given, Poor Dentition, Missing   Pulmonary former smoker,    Pulmonary exam normal breath sounds clear to auscultation       Cardiovascular hypertension, Pt. on medications + CAD, + Past MI and + CABG  Normal cardiovascular exam Rhythm:Regular Rate:Normal     Neuro/Psych negative neurological ROS  negative psych ROS   GI/Hepatic Neg liver ROS, GERD  Medicated,  Endo/Other  diabetes, Type 2  Renal/GU ESRF and DialysisRenal disease (TThSat- K+ 3.8)pseudoaneurysm right arm fistula   H/o metastatic prostate cancer     Musculoskeletal negative musculoskeletal ROS (+)   Abdominal   Peds  Hematology  (+) Blood dyscrasia, anemia ,   Anesthesia Other Findings Day of surgery medications reviewed with the patient.  Reproductive/Obstetrics                             Anesthesia Physical Anesthesia Plan  ASA: III  Anesthesia Plan: General   Post-op Pain Management:    Induction: Intravenous  PONV Risk Score and Plan: 2 and Ondansetron and Dexamethasone  Airway Management Planned: LMA  Additional Equipment: None  Intra-op Plan:   Post-operative Plan: Extubation in OR  Informed Consent: I have reviewed the patients History and Physical, chart, labs and discussed the procedure including the risks, benefits and alternatives for the proposed anesthesia with the patient or authorized representative who has indicated his/her understanding and acceptance.     Dental advisory given  Plan Discussed with: CRNA and  Surgeon  Anesthesia Plan Comments:         Anesthesia Quick Evaluation

## 2018-04-03 NOTE — Anesthesia Procedure Notes (Signed)
Procedure Name: LMA Insertion Date/Time: 04/03/2018 7:38 AM Performed by: Barrington Ellison, CRNA Pre-anesthesia Checklist: Patient identified, Emergency Drugs available, Suction available and Patient being monitored Patient Re-evaluated:Patient Re-evaluated prior to induction Oxygen Delivery Method: Circle System Utilized Preoxygenation: Pre-oxygenation with 100% oxygen Induction Type: IV induction Ventilation: Mask ventilation without difficulty LMA: LMA inserted LMA Size: 4.0 Number of attempts: 1 Placement Confirmation: positive ETCO2 Tube secured with: Tape Dental Injury: Teeth and Oropharynx as per pre-operative assessment

## 2018-04-03 NOTE — Discharge Instructions (Signed)
You may wash your arm with soap and water in 24 hours.  Gradually restart your daily routine.  Do not allow them to stick near the new incision for 4 weeks from today 04/03/2018.

## 2018-04-03 NOTE — Op Note (Signed)
    OPERATIVE REPORT  DATE OF SURGERY: 04/03/2018  PATIENT: [name] [name], 85 y.o. male MRN: [id number]  DOB: 25-Oct-1930  PRE-OPERATIVE DIAGNOSIS: End-stage renal disease with aneurysmal degeneration right upper arm AV fistula  POST-OPERATIVE DIAGNOSIS:  Same  PROCEDURE: Revision with resection of aneurysm and plication of aneurysm right upper arm fistula in the more distal segment  SURGEON:  [name], M.D.  PHYSICIAN ASSISTANT: Collins PA-C  ANESTHESIA: LMA  EBL: per anesthesia record  Total I/O In: 250 [I.V.:250] Out: 20 [Blood:20]  BLOOD ADMINISTERED: none  DRAINS: none  SPECIMEN: none  COUNTS CORRECT:  YES  PATIENT DISPOSITION:  PACU - hemodynamically stable  PROCEDURE DETAILS: Patient was taken up and placed to position with area of the right arm prepped draped you sterile fashion.  An ellipse of skin was removed over the aneurysmal portion of the fistula.  The aneurysmal vein was mobilized circumferentially above and below the aneurysmal segment.  The vein was occluded proximally and distally with a baby Gregory clamps.  A ellipse of vein was resected and the vein was closed with 2 layers of 5-0 Prolene suture.  Clamps removed and excellent thrill was noted.  The suture line was rolled to the medial segment away from where the puncture site would be.  The skin was closed in single layer in the subcuticular tissue with 3-0 Vicryl suture.  Sterile dressing was applied and the patient was transferred to the recovery in stable condition   Rosetta Posner, M.D., [facility] 04/03/2018 9:03 AM

## 2018-04-03 NOTE — Transfer of Care (Signed)
Immediate Anesthesia Transfer of Care Note  Patient: [name] [name]  Procedure(s) Performed: REVISION OF ARTERIOVENOUS FISTULA RIGHT ARM (Right )  Patient Location: PACU  Anesthesia Type:General  Level of Consciousness: drowsy and patient cooperative  Airway & Oxygen Therapy: Patient Spontanous Breathing and Patient connected to face mask oxygen  Post-op Assessment: Report given to RN  Post vital signs: Reviewed and stable  Last Vitals:  Vitals Value Taken Time  BP 125/51 04/03/2018  8:53 AM  Temp    Pulse 60 04/03/2018  8:54 AM  Resp 12 04/03/2018  8:54 AM  SpO2 100 % 04/03/2018  8:54 AM  Vitals shown include unvalidated device data.  Last Pain:  Vitals:   04/03/18 0600  TempSrc: Oral  PainSc: 0-No pain         Complications: No apparent anesthesia complications

## 2018-04-03 NOTE — H&P (Signed)
Office Visit   03/01/2018 [facility], [name], [place]  Vascular Surgery   ESRD on dialysis [facility])  Dx   Post-op Follow-up ; Referred by [name] Downing, MD  Reason for Visit   Additional Documentation   Vitals:   BP 122/54 (BP Location: Left Arm, Patient Position: Sitting)   Pulse 73   Temp 98 F (36.7 C) (Oral)   Resp 16   Ht 6' (1.829 m)   Wt 80.6 kg   SpO2 100%   BMI 24.08 kg/m   BSA 2.02 m   Flowsheets:   Clinical Intake,   Vital Signs,   MEWS Score,   Anthropometrics     Encounter Info:   Billing Info,   History,   Allergies,   Detailed Report     All Notes   Progress Notes by [name] Earing, PA-C at 03/01/2018 1:15 PM  Author: Gabriel Earing, PA-C Author Type: Physician Assistant Filed: 03/01/2018 1:11 PM  Note Status: Signed Cosign: Cosign Not Required Encounter Date: 03/01/2018  Editor: [name] [name] (Physician Assistant)     POST OPERATIVE OFFICE NOTE    CC:  F/u for surgery  HPI:  This is a 85 y.o. male who is s/p RUA AVF on 02/06/2018 by Dr. Donzetta [name].  He returns today for follow up.  He has other aneurysmal area on his fistula to evaluate for revision.  He states he has done well since surgery.  They are using his fistula without difficulty.  He states he did have some bleeding from the fistula this morning from the stick yesterday at the aneurysmal area.   No Known Allergies        Current Outpatient Medications  Medication Sig Dispense Refill  . amLODipine (NORVASC) 5 MG tablet Take 1 tablet (5 mg total) by mouth daily. (Patient taking differently: Take 5 mg by mouth every evening. ) 90 tablet 3  . aspirin EC 81 MG tablet Take 1 tablet (81 mg total) by mouth daily. (Patient taking differently: Take 81 mg by mouth every evening. ) 90 tablet 3  . atorvastatin (LIPITOR) 20 MG tablet Take 1 tablet (20 mg total) by mouth daily. (Patient taking differently: Take 20 mg by mouth every  evening. ) 90 tablet 3  . B Complex-C-Folic Acid (DIALYVITE 546) 0.8 MG TABS Take 1 tablet by mouth every evening.   3  . calcium acetate (PHOSLO) 667 MG capsule Take 1-2 capsules (667-1,334 mg total) by mouth See admin instructions. Take 2 capsules (1334 mg) with meals and 1 capsule (667 mg) with snacks or a sandwich (Patient not taking: Reported on 01/28/2018) 180 capsule 1  . cinacalcet (SENSIPAR) 60 MG tablet Take 60 mg by mouth every Tuesday, Thursday, and Saturday at 6 PM.     . latanoprost (XALATAN) 0.005 % ophthalmic solution Place 1 drop into both eyes at bedtime.    Marland Kitchen leuprolide (LUPRON) 30 MG injection Inject 30 mg into the muscle every 4 (four) months.    Marland Kitchen oxyCODONE-acetaminophen (PERCOCET) 5-325 MG tablet Take 1 tablet by mouth every 6 (six) hours as needed for severe pain. 8 tablet 0  . pantoprazole (PROTONIX) 40 MG tablet Take 1 tablet (40 mg total) by mouth daily. (Patient not taking: Reported on 01/28/2018) 30 tablet 1  . XTANDI 40 MG capsule TAKE 4 CAPSULES (160 MG TOTAL) BY MOUTH DAILY. 120 capsule 0   No current facility-administered medications for this visit.  ROS:  See HPI  Physical Exam:     Today's Vitals   03/01/18 1215  BP: (!) 122/54  Pulse: 73  Resp: 16  Temp: 98 F (36.7 C)  TempSrc: Oral  SpO2: 100%  Weight: 177 lb 9.3 oz (80.6 kg)  Height: 6' (1.829 m)   Body mass index is 24.08 kg/m.   Incision:  Well healed Extremities:  Unable to palpate right radial pulse; there is an excellent thrill within the fistula.  Small scab on aneurysmal area; bandage in place over needle hole with some bloody drainage.    Assessment/Plan:  This is a 85 y.o. male who is s/p:  RUA AVF on 02/06/2018 by Dr. Donzetta [name].  -aneurysmal area that was repaired looks good.  He has another aneurysmal area that has a small scab and the area where he was stuck yesterday still had some bleeding this morning.   -Dr. Donzetta [name] evaluate pt and we will plan to repair this  aneurysmal area at his earliest convienience.   He is not on any AC -would continue to stick above repair for another month and then may start sticking area that was repaired.  -he dialyzes T/T/S the center on [address].    [name] Locket, PA-C [facility] [phone]  Clinic MD:  [name] [name]     Addendum:  The patient has been re-examined and re-evaluated.  The patient's history and physical has been reviewed and is unchanged.    [name] [name] is a 85 y.o. male is being admitted with [place]. All the risks, benefits and other treatment options have been discussed with the patient. The patient has consented to proceed with Procedure(s): REVISION OF ARTERIOVENOUS FISTULA RIGHT ARM as a surgical intervention.  [name] [name] 04/03/2018 7:17 AM [facility]

## 2018-04-04 ENCOUNTER — Other Ambulatory Visit: Payer: Self-pay | Admitting: Oncology

## 2018-04-04 ENCOUNTER — Encounter (HOSPITAL_COMMUNITY): Payer: Self-pay | Admitting: Vascular Surgery

## 2018-04-04 DIAGNOSIS — C61 Malignant neoplasm of prostate: Secondary | ICD-10-CM

## 2018-04-04 NOTE — Anesthesia Postprocedure Evaluation (Signed)
Anesthesia Post Note  Patient: [name] [name]  Procedure(s) Performed: REVISION OF ARTERIOVENOUS FISTULA RIGHT ARM (Right )     Patient location during evaluation: PACU Anesthesia Type: General Level of consciousness: awake and alert Pain management: pain level controlled Vital Signs Assessment: post-procedure vital signs reviewed and stable Respiratory status: spontaneous breathing, nonlabored ventilation, respiratory function stable and patient connected to nasal cannula oxygen Cardiovascular status: blood pressure returned to baseline and stable Postop Assessment: no apparent nausea or vomiting Anesthetic complications: no    Last Vitals:  Vitals:   04/03/18 0945 04/03/18 0948  BP:    Pulse: 67 66  Resp: 14 13  Temp: (!) 36.1 C   SpO2: 99% 99%    Last Pain:  Vitals:   04/03/18 0945  TempSrc:   PainSc: 0-No pain                 Jill Stopka

## 2018-04-10 MED FILL — XTANDI 40 MG CAPSULE: 40 | 30 days supply | Qty: 120 | Fill #0

## 2018-04-24 ENCOUNTER — Inpatient Hospital Stay: Payer: Medicare Other | Attending: Oncology

## 2018-04-24 ENCOUNTER — Other Ambulatory Visit: Payer: Self-pay

## 2018-04-24 ENCOUNTER — Inpatient Hospital Stay: Payer: Medicare Other

## 2018-04-24 DIAGNOSIS — C61 Malignant neoplasm of prostate: Secondary | ICD-10-CM

## 2018-04-24 DIAGNOSIS — D638 Anemia in other chronic diseases classified elsewhere: Secondary | ICD-10-CM | POA: Diagnosis not present

## 2018-04-24 DIAGNOSIS — C7951 Secondary malignant neoplasm of bone: Secondary | ICD-10-CM | POA: Insufficient documentation

## 2018-04-24 DIAGNOSIS — D649 Anemia, unspecified: Secondary | ICD-10-CM

## 2018-04-24 LAB — CBC WITH DIFFERENTIAL (CANCER CENTER ONLY)
Abs Immature Granulocytes: 0.02 10*3/uL (ref 0.00–0.07)
Basophils Absolute: 0 10*3/uL (ref 0.0–0.1)
Basophils Relative: 1 %
Eosinophils Absolute: 0.1 10*3/uL (ref 0.0–0.5)
Eosinophils Relative: 3 %
HCT: 23.1 % — ABNORMAL LOW (ref 39.0–52.0)
Hemoglobin: 7 g/dL — ABNORMAL LOW (ref 13.0–17.0)
Immature Granulocytes: 1 %
Lymphocytes Relative: 19 %
Lymphs Abs: 0.8 10*3/uL (ref 0.7–4.0)
MCH: 31.3 pg (ref 26.0–34.0)
MCHC: 30.3 g/dL (ref 30.0–36.0)
MCV: 103.1 fL — ABNORMAL HIGH (ref 80.0–100.0)
Monocytes Absolute: 0.4 10*3/uL (ref 0.1–1.0)
Monocytes Relative: 9 %
Neutro Abs: 3 10*3/uL (ref 1.7–7.7)
Neutrophils Relative %: 67 %
Platelet Count: 230 10*3/uL (ref 150–400)
RBC: 2.24 MIL/uL — ABNORMAL LOW (ref 4.22–5.81)
RDW: 18.3 % — ABNORMAL HIGH (ref 11.5–15.5)
WBC Count: 4.4 10*3/uL (ref 4.0–10.5)
nRBC: 0 % (ref 0.0–0.2)

## 2018-04-24 LAB — SAMPLE TO BLOOD BANK

## 2018-04-24 LAB — PREPARE RBC (CROSSMATCH)

## 2018-04-24 MED ORDER — DIPHENHYDRAMINE HCL 25 MG PO CAPS
25.0000 mg | ORAL_CAPSULE | Freq: Once | ORAL | Status: AC
  Administered 2018-04-24: 25 mg via ORAL

## 2018-04-24 MED ORDER — ACETAMINOPHEN 325 MG PO TABS
ORAL_TABLET | ORAL | Status: AC
  Filled 2018-04-24: qty 2

## 2018-04-24 MED ORDER — SODIUM CHLORIDE 0.9% IV SOLUTION
250.0000 mL | Freq: Once | INTRAVENOUS | Status: AC
  Administered 2018-04-24: 250 mL via INTRAVENOUS
  Filled 2018-04-24: qty 250

## 2018-04-24 MED ORDER — ACETAMINOPHEN 325 MG PO TABS
650.0000 mg | ORAL_TABLET | Freq: Once | ORAL | Status: AC
  Administered 2018-04-24: 650 mg via ORAL

## 2018-04-24 MED ORDER — DIPHENHYDRAMINE HCL 25 MG PO CAPS
ORAL_CAPSULE | ORAL | Status: AC
  Filled 2018-04-24: qty 1

## 2018-04-24 NOTE — Patient Instructions (Signed)

## 2018-04-25 LAB — BPAM RBC
Blood Product Expiration Date: 202004222359
ISSUE DATE / TIME: 202004150935
Unit Type and Rh: 5100

## 2018-04-25 LAB — TYPE AND SCREEN
ABO/RH(D): O POS
Antibody Screen: NEGATIVE
Unit division: 0

## 2018-05-02 ENCOUNTER — Other Ambulatory Visit: Payer: Self-pay | Admitting: Oncology

## 2018-05-02 DIAGNOSIS — C61 Malignant neoplasm of prostate: Secondary | ICD-10-CM

## 2018-05-03 ENCOUNTER — Other Ambulatory Visit: Payer: Self-pay | Admitting: Oncology

## 2018-05-03 DIAGNOSIS — C61 Malignant neoplasm of prostate: Secondary | ICD-10-CM

## 2018-05-13 MED FILL — XTANDI 40 MG CAPSULE: 40 | 30 days supply | Qty: 120 | Fill #0

## 2018-05-29 ENCOUNTER — Inpatient Hospital Stay: Payer: Medicare Other

## 2018-05-29 ENCOUNTER — Telehealth: Payer: Self-pay | Admitting: *Deleted

## 2018-05-29 ENCOUNTER — Other Ambulatory Visit: Payer: Self-pay

## 2018-05-29 ENCOUNTER — Inpatient Hospital Stay: Payer: Medicare Other | Attending: Oncology

## 2018-05-29 ENCOUNTER — Inpatient Hospital Stay (HOSPITAL_BASED_OUTPATIENT_CLINIC_OR_DEPARTMENT_OTHER): Payer: Medicare Other | Admitting: Oncology

## 2018-05-29 VITALS — BP 107/39 | HR 65 | Temp 97.7°F | Resp 17 | Ht 72.0 in | Wt 168.0 lb

## 2018-05-29 DIAGNOSIS — E291 Testicular hypofunction: Secondary | ICD-10-CM

## 2018-05-29 DIAGNOSIS — C7951 Secondary malignant neoplasm of bone: Secondary | ICD-10-CM

## 2018-05-29 DIAGNOSIS — D649 Anemia, unspecified: Secondary | ICD-10-CM

## 2018-05-29 DIAGNOSIS — Z992 Dependence on renal dialysis: Secondary | ICD-10-CM | POA: Diagnosis not present

## 2018-05-29 DIAGNOSIS — C61 Malignant neoplasm of prostate: Secondary | ICD-10-CM

## 2018-05-29 DIAGNOSIS — Z7189 Other specified counseling: Secondary | ICD-10-CM

## 2018-05-29 DIAGNOSIS — D630 Anemia in neoplastic disease: Secondary | ICD-10-CM

## 2018-05-29 DIAGNOSIS — N19 Unspecified kidney failure: Secondary | ICD-10-CM

## 2018-05-29 DIAGNOSIS — D6481 Anemia due to antineoplastic chemotherapy: Secondary | ICD-10-CM | POA: Diagnosis not present

## 2018-05-29 LAB — CMP (CANCER CENTER ONLY)
ALT: 8 U/L (ref 0–44)
AST: 18 U/L (ref 15–41)
Albumin: 2.5 g/dL — ABNORMAL LOW (ref 3.5–5.0)
Alkaline Phosphatase: 82 U/L (ref 38–126)
Anion gap: 11 (ref 5–15)
BUN: 27 mg/dL — ABNORMAL HIGH (ref 8–23)
CO2: 27 mmol/L (ref 22–32)
Calcium: 7.7 mg/dL — ABNORMAL LOW (ref 8.9–10.3)
Chloride: 100 mmol/L (ref 98–111)
Creatinine: 3.5 mg/dL (ref 0.61–1.24)
GFR, Est AFR Am: 17 mL/min — ABNORMAL LOW (ref >60)
GFR, Estimated: 15 mL/min — ABNORMAL LOW (ref >60)
Glucose, Bld: 81 mg/dL (ref 70–99)
Potassium: 4.1 mmol/L (ref 3.5–5.1)
Sodium: 138 mmol/L (ref 135–145)
Total Bilirubin: 0.5 mg/dL (ref 0.3–1.2)
Total Protein: 6 g/dL — ABNORMAL LOW (ref 6.5–8.1)

## 2018-05-29 LAB — CBC WITH DIFFERENTIAL (CANCER CENTER ONLY)
Abs Immature Granulocytes: 0.01 10*3/uL (ref 0.00–0.07)
Basophils Absolute: 0 10*3/uL (ref 0.0–0.1)
Basophils Relative: 1 %
Eosinophils Absolute: 0.1 10*3/uL (ref 0.0–0.5)
Eosinophils Relative: 2 %
HCT: 24.1 % — ABNORMAL LOW (ref 39.0–52.0)
Hemoglobin: 7.3 g/dL — ABNORMAL LOW (ref 13.0–17.0)
Immature Granulocytes: 0 %
Lymphocytes Relative: 18 %
Lymphs Abs: 0.6 10*3/uL — ABNORMAL LOW (ref 0.7–4.0)
MCH: 31.5 pg (ref 26.0–34.0)
MCHC: 30.3 g/dL (ref 30.0–36.0)
MCV: 103.9 fL — ABNORMAL HIGH (ref 80.0–100.0)
Monocytes Absolute: 0.4 10*3/uL (ref 0.1–1.0)
Monocytes Relative: 11 %
Neutro Abs: 2.5 10*3/uL (ref 1.7–7.7)
Neutrophils Relative %: 68 %
Platelet Count: 264 10*3/uL (ref 150–400)
RBC: 2.32 MIL/uL — ABNORMAL LOW (ref 4.22–5.81)
RDW: 19.9 % — ABNORMAL HIGH (ref 11.5–15.5)
WBC Count: 3.7 10*3/uL — ABNORMAL LOW (ref 4.0–10.5)
nRBC: 0 % (ref 0.0–0.2)

## 2018-05-29 LAB — IRON AND TIBC
Iron: 58 ug/dL (ref 42–163)
Saturation Ratios: 18 % — ABNORMAL LOW (ref 20–55)
TIBC: 333 ug/dL (ref 202–409)
UIBC: 274 ug/dL (ref 117–376)

## 2018-05-29 LAB — SAMPLE TO BLOOD BANK

## 2018-05-29 LAB — FERRITIN: Ferritin: 164 ng/mL (ref 24–336)

## 2018-05-29 NOTE — Telephone Encounter (Signed)
Received call report from [facility].  "Today's Creat. = 3.5."  Secure message sent to provider with results.    Scheduled provider F/U today at 9:15 am.

## 2018-05-29 NOTE — Progress Notes (Signed)
Hematology and Oncology Follow Up Visit  [name] [name] [id number] 08-03-1930 85 y.o. 05/29/2018 8:31 AM [name] [name], MDElkins, Curt [name], *   Principle Diagnosis: 85 year old man with:  1.  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  He presented with Gleason score 4+4 = 8 PSA of 369.    2.  Anemia: Multifactorial in nature related to malignancy and chronically insufficiency.  Prior Therapy: Androgen deprivation therapy and he remains on it under the care of Dr. Diona [name]. His PSA decreased to 31 in 2017. His PSA in November 2017 was 41 and in March 2018 was 29.    Current therapy:   Xtandi 160 mg daily started in March 2019.  Packed red cell transfusion and IV iron as needed.  Interim History: [name] [name] returns today for a repeat evaluation.  Since last visit, he reports no major changes in his health.  He tolerates Xtandi without any recent complaints.  He denies any nausea, fatigue or excessive weakness.  He denies any discrete exertion or recent hospitalizations.  Continues to receive dialysis without any issues.  He denies any excessive fatigue or tiredness.  His performance status and quality of life remains unchanged.   He denied headaches, blurry vision, syncope or seizures.  Denies any fevers, chills or sweats.  Denied chest pain, palpitation, orthopnea or leg edema.  Denied cough, wheezing or hemoptysis.  Denied nausea, vomiting or abdominal pain.  Denies any constipation or diarrhea.  Denies any frequency urgency or hesitancy.  Denies any arthralgias or myalgias.  Denies any skin rashes or lesions.  Denies any bleeding or clotting tendency.  Denies any easy bruising.  Denies any hair or nail changes.  Denies any anxiety or depression.  Remaining review of system is negative.     .    Medications: I have reviewed the patient's current medications.  Current Outpatient Medications  Medication Sig Dispense Refill  .  amLODipine (NORVASC) 5 MG tablet Take 1 tablet (5 mg total) by mouth daily. (Patient taking differently: Take 5 mg by mouth every evening. ) 90 tablet 3  . aspirin EC 81 MG tablet Take 1 tablet (81 mg total) by mouth daily. (Patient taking differently: Take 81 mg by mouth every evening. ) 90 tablet 3  . atorvastatin (LIPITOR) 20 MG tablet Take 1 tablet (20 mg total) by mouth daily. (Patient taking differently: Take 20 mg by mouth every evening. ) 90 tablet 3  . B Complex-C-Folic Acid (DIALYVITE 935) 0.8 MG TABS Take 1 tablet by mouth every evening.   3  . cinacalcet (SENSIPAR) 60 MG tablet Take 120 mg by mouth every evening.     . latanoprost (XALATAN) 0.005 % ophthalmic solution Place 1 drop into both eyes at bedtime.    Marland Kitchen leuprolide (LUPRON) 30 MG injection Inject 30 mg into the muscle every 4 (four) months.    Marland Kitchen oxyCODONE-acetaminophen (PERCOCET) 5-325 MG tablet Take 1 tablet by mouth every 6 (six) hours as needed for severe pain. 10 tablet 0  . XTANDI 40 MG capsule TAKE 4 CAPSULES (160 MG TOTAL) BY MOUTH DAILY. 120 capsule 0   No current facility-administered medications for this visit.      Allergies: No Known Allergies  Past Medical History, Surgical history, Social history, and Family History were reviewed and updated.  Physical Exam:    Blood pressure (!) 107/39, pulse 65, temperature 97.7 F (36.5 C), temperature source Oral, resp. rate 17, height 6' (1.829 m), weight 168 lb (76.2  kg), SpO2 100 %.     ECOG: 1     General appearance: Alert, awake without any distress. Head: Atraumatic without abnormalities Oropharynx: Without any thrush or ulcers. Eyes: No scleral icterus. Lymph nodes: No lymphadenopathy noted in the cervical, supraclavicular, or axillary nodes Heart:regular rate and rhythm, without any murmurs or gallops.   Lung: Clear to auscultation without any rhonchi, wheezes or dullness to percussion. Abdomin: Soft, nontender without any shifting dullness or  ascites. Musculoskeletal: No clubbing or cyanosis. Neurological: No motor or sensory deficits. Skin: No rashes or lesions.          Lab Results: Lab Results  Component Value Date   WBC 4.4 04/24/2018   HGB 7.0 (L) 04/24/2018   HCT 23.1 (L) 04/24/2018   MCV 103.1 (H) 04/24/2018   PLT 230 04/24/2018     Chemistry      Component Value Date/Time   NA 137 04/03/2018 0611   NA 143 09/29/2016 0810   K 3.5 04/03/2018 0611   K 4.3 09/29/2016 0810   CL 101 03/27/2018 0907   CO2 27 03/27/2018 0907   CO2 31 (H) 09/29/2016 0810   BUN 25 (H) 03/27/2018 0907   BUN 27.9 (H) 09/29/2016 0810   CREATININE 3.92 (HH) 03/27/2018 0907   CREATININE 7.4 (HH) 09/29/2016 0810      Component Value Date/Time   CALCIUM 7.9 (L) 03/27/2018 0907   CALCIUM 9.9 09/29/2016 0810   ALKPHOS 92 03/27/2018 0907   ALKPHOS 141 09/29/2016 0810   AST 15 03/27/2018 0907   AST 21 09/29/2016 0810   ALT 9 03/27/2018 0907   ALT 10 09/29/2016 0810   BILITOT 0.6 03/27/2018 0907   BILITOT 0.48 09/29/2016 0810      Results for [name] [name] (MRN [id number]) as of 05/29/2018 08:33  Ref. Range 12/19/2017 09:36 03/27/2018 09:07  Prostate Specific Ag, Serum Latest Ref Range: 0.0 - 4.0 ng/mL 0.3 0.1            Impression and Plan:   85 year old man with:  1.  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    He remains on Xtandi with excellent tolerance and benefit at this time.  His PSA continues to be low without any signs of progression of disease.  Risks and benefits of continuing this therapy long-term was discussed today.  Future complications as well as alternative therapy were reviewed.  At this time I recommended continuing the same dose and schedule.  2. Androgen deprivation therapy: He is currently receiving that under the care of Dr. Diona [name].  I recommended continuing this indefinitely.   3.  Anemia: Related due to malignancy and cancer  treatment.  He has also element of iron deficiency and renal insufficiency.  The plan is to continue with supportive management including packed red cell transfusion and iron replacement as needed.  His hemoglobin today is over 7 and he is asymptomatic.  We will hold off on any transfusion unless his hemoglobin below 7 or if he becomes symptomatic.  We will continue to measure his iron periodically and replace as needed.  4.  Prognosis and goals of care: His cancer is incurable although aggressive therapy is warranted given his reasonable performance status.  5.  Renal failure: Currently hemodialysis dependent.   6. Follow-up: We will be in 1 month to repeat laboratory testing and possible transfusion.  He will have MD follow-up in 2 months.  25  minutes was spent with the patient  face-to-face today.  More than 50% of time was dedicated to updating his disease status, reviewing laboratory data and answering questions regarding future plan of care.  Zola Button, MD 5/20/20208:31 AM

## 2018-05-30 ENCOUNTER — Telehealth: Payer: Self-pay | Admitting: Oncology

## 2018-05-30 ENCOUNTER — Telehealth: Payer: Self-pay

## 2018-05-30 LAB — PROSTATE-SPECIFIC AG, SERUM (LABCORP): Prostate Specific Ag, Serum: <0.1 ng/mL (ref 0.0–4.0)

## 2018-05-30 NOTE — Telephone Encounter (Signed)
Patient made aware that per Dr. Alen Blew PSA is low. Verbalized understanding.

## 2018-05-30 NOTE — Telephone Encounter (Signed)
-----   Message from [name] Portela, MD sent at 05/30/2018  8:49 AM EDT ----- Please let him know his PSA is low

## 2018-05-30 NOTE — Telephone Encounter (Signed)
Called regarding schedule °

## 2018-06-04 ENCOUNTER — Other Ambulatory Visit: Payer: Self-pay | Admitting: Oncology

## 2018-06-04 DIAGNOSIS — C61 Malignant neoplasm of prostate: Secondary | ICD-10-CM

## 2018-06-10 ENCOUNTER — Other Ambulatory Visit: Payer: Self-pay | Admitting: Physician Assistant

## 2018-06-10 MED FILL — XTANDI 40 MG CAPSULE: 40 | 30 days supply | Qty: 120 | Fill #0

## 2018-06-17 ENCOUNTER — Other Ambulatory Visit: Payer: Self-pay

## 2018-06-17 ENCOUNTER — Telehealth: Payer: Self-pay

## 2018-06-17 ENCOUNTER — Inpatient Hospital Stay: Payer: Medicare Other | Attending: Oncology

## 2018-06-17 ENCOUNTER — Inpatient Hospital Stay: Payer: Medicare Other

## 2018-06-17 DIAGNOSIS — D649 Anemia, unspecified: Secondary | ICD-10-CM

## 2018-06-17 DIAGNOSIS — D6481 Anemia due to antineoplastic chemotherapy: Secondary | ICD-10-CM | POA: Insufficient documentation

## 2018-06-17 DIAGNOSIS — D630 Anemia in neoplastic disease: Secondary | ICD-10-CM | POA: Insufficient documentation

## 2018-06-17 DIAGNOSIS — C7951 Secondary malignant neoplasm of bone: Secondary | ICD-10-CM | POA: Diagnosis not present

## 2018-06-17 DIAGNOSIS — C61 Malignant neoplasm of prostate: Secondary | ICD-10-CM | POA: Insufficient documentation

## 2018-06-17 LAB — CBC WITH DIFFERENTIAL (CANCER CENTER ONLY)
Abs Immature Granulocytes: 0.01 10*3/uL (ref 0.00–0.07)
Basophils Absolute: 0 10*3/uL (ref 0.0–0.1)
Basophils Relative: 1 %
Eosinophils Absolute: 0 10*3/uL (ref 0.0–0.5)
Eosinophils Relative: 1 %
HCT: 16.6 % — ABNORMAL LOW (ref 39.0–52.0)
Hemoglobin: 4.8 g/dL — CL (ref 13.0–17.0)
Immature Granulocytes: 0 %
Lymphocytes Relative: 19 %
Lymphs Abs: 0.7 10*3/uL (ref 0.7–4.0)
MCH: 28.4 pg (ref 26.0–34.0)
MCHC: 28.9 g/dL — ABNORMAL LOW (ref 30.0–36.0)
MCV: 98.2 fL (ref 80.0–100.0)
Monocytes Absolute: 0.3 10*3/uL (ref 0.1–1.0)
Monocytes Relative: 9 %
Neutro Abs: 2.5 10*3/uL (ref 1.7–7.7)
Neutrophils Relative %: 70 %
Platelet Count: 273 10*3/uL (ref 150–400)
RBC: 1.69 MIL/uL — ABNORMAL LOW (ref 4.22–5.81)
RDW: 16.4 % — ABNORMAL HIGH (ref 11.5–15.5)
WBC Count: 3.6 10*3/uL — ABNORMAL LOW (ref 4.0–10.5)
nRBC: 0 % (ref 0.0–0.2)

## 2018-06-17 LAB — SAMPLE TO BLOOD BANK

## 2018-06-17 LAB — PREPARE RBC (CROSSMATCH)

## 2018-06-17 MED ORDER — DIPHENHYDRAMINE HCL 25 MG PO CAPS
25.0000 mg | ORAL_CAPSULE | Freq: Once | ORAL | Status: AC
  Administered 2018-06-17: 25 mg via ORAL

## 2018-06-17 MED ORDER — DIPHENHYDRAMINE HCL 25 MG PO CAPS
ORAL_CAPSULE | ORAL | Status: AC
  Filled 2018-06-17: qty 1

## 2018-06-17 MED ORDER — SODIUM CHLORIDE 0.9% IV SOLUTION
250.0000 mL | Freq: Once | INTRAVENOUS | Status: AC
  Administered 2018-06-17: 250 mL via INTRAVENOUS
  Filled 2018-06-17: qty 250

## 2018-06-17 MED ORDER — ACETAMINOPHEN 325 MG PO TABS
ORAL_TABLET | ORAL | Status: AC
  Filled 2018-06-17: qty 2

## 2018-06-17 MED ORDER — ACETAMINOPHEN 325 MG PO TABS
650.0000 mg | ORAL_TABLET | Freq: Once | ORAL | Status: AC
  Administered 2018-06-17: 650 mg via ORAL

## 2018-06-17 NOTE — Telephone Encounter (Signed)
Dr. Alen Blew made aware of hemoglobin of 4.8. Per Dr. Alen Blew, orders placed for 2 units packed red blood cells. Infusion RN, [place] aware.

## 2018-06-17 NOTE — Patient Instructions (Signed)
Blood Transfusion, Adult, Care After This sheet gives you information about how to care for yourself after your procedure. Your doctor may also give you more specific instructions. If you have problems or questions, contact your doctor. Follow these instructions at home:   Take over-the-counter and prescription medicines only as told by your doctor.  Go back to your normal activities as told by your doctor.  Follow instructions from your doctor about how to take care of the area where an IV tube was put into your vein (insertion site). Make sure you: ? Wash your hands with soap and water before you change your bandage (dressing). If there is no soap and water, use hand sanitizer. ? Change your bandage as told by your doctor.  Check your IV insertion site every day for signs of infection. Check for: ? More redness, swelling, or pain. ? More fluid or blood. ? Warmth. ? Pus or a bad smell. Contact a doctor if:  You have more redness, swelling, or pain around the IV insertion site.  You have more fluid or blood coming from the IV insertion site.  Your IV insertion site feels warm to the touch.  You have pus or a bad smell coming from the IV insertion site.  Your pee (urine) turns pink, red, or brown.  You feel weak after doing your normal activities. Get help right away if:  You have signs of a serious allergic or body defense (immune) system reaction, including: ? Itchiness. ? Hives. ? Trouble breathing. ? Anxiety. ? Pain in your chest or lower back. ? Fever, flushing, and chills. ? Fast pulse. ? Rash. ? Watery poop (diarrhea). ? Throwing up (vomiting). ? Dark pee. ? Serious headache. ? Dizziness. ? Stiff neck. ? Yellow color in your face or the white parts of your eyes (jaundice). Summary  After a blood transfusion, return to your normal activities as told by your doctor.  Every day, check for signs of infection where the IV tube was put into your vein.  Some  signs of infection are warm skin, more redness and pain, more fluid or blood, and pus or a bad smell where the needle went in.  Contact your doctor if you feel weak or have any unusual symptoms. This information is not intended to replace advice given to you by your health care provider. Make sure you discuss any questions you have with your health care provider. Document Released: 01/16/2014 Document Revised: 08/20/2015 Document Reviewed: 08/20/2015 Elsevier Interactive Patient Education  2019 [employer] Inc.   Coronavirus (COVID-19) Are you at risk?  Are you at risk for the Coronavirus (COVID-19)?  To be considered HIGH RISK for Coronavirus (COVID-19), you have to meet the following criteria:  . Traveled to [place], [place], [place], [place] or [place]; or in the [place] to [place], [place], [place], or [place]; and have fever, cough, and shortness of breath within the last 2 weeks of travel OR . Been in close contact with a person diagnosed with COVID-19 within the last 2 weeks and have fever, cough, and shortness of breath . IF YOU DO NOT MEET THESE CRITERIA, YOU ARE CONSIDERED LOW RISK FOR COVID-19.  What to do if you are HIGH RISK for COVID-19?  . If you are having a medical emergency, call 911. . Seek medical care right away. Before you go to a doctor's office, urgent care or emergency department, call ahead and tell them about your recent travel, contact with someone diagnosed with   COVID-19, and your symptoms. You should receive instructions from your physician's office regarding next steps of care.  . When you arrive at healthcare provider, tell the healthcare staff immediately you have returned from visiting [place], [place], [place], [place] or [place]; or traveled in the [place] to [place], [place], [place], or [place]; in the last two weeks or you have been in close contact with a person diagnosed with COVID-19 in the last 2 weeks.   . Tell the health care  staff about your symptoms: fever, cough and shortness of breath. . After you have been seen by a medical provider, you will be either: o Tested for (COVID-19) and discharged home on quarantine except to seek medical care if symptoms worsen, and asked to  - Stay home and avoid contact with others until you get your results (4-5 days)  - Avoid travel on public transportation if possible (such as bus, train, or airplane) or o Sent to the Emergency Department by EMS for evaluation, COVID-19 testing, and possible admission depending on your condition and test results.  What to do if you are LOW RISK for COVID-19?  Reduce your risk of any infection by using the same precautions used for avoiding the common cold or flu:  . Wash your hands often with soap and warm water for at least 20 seconds.  If soap and water are not readily available, use an alcohol-based hand sanitizer with at least 60% alcohol.  . If coughing or sneezing, cover your mouth and nose by coughing or sneezing into the elbow areas of your shirt or coat, into a tissue or into your sleeve (not your hands). . Avoid shaking hands with others and consider head nods or verbal greetings only. . Avoid touching your eyes, nose, or mouth with unwashed hands.  . Avoid close contact with people who are sick. . Avoid places or events with large numbers of people in one location, like concerts or sporting events. . Carefully consider travel plans you have or are making. . If you are planning any travel outside or inside the [place], visit the CDC's Travelers' Health webpage for the latest health notices. . If you have some symptoms but not all symptoms, continue to monitor at home and seek medical attention if your symptoms worsen. . If you are having a medical emergency, call 911.   ADDITIONAL HEALTHCARE OPTIONS FOR PATIENTS  [facility] / e-Visit: [email or web address]         [facility]:  [phone]  [facility]: [phone]                   [facility]: [phone]   

## 2018-06-18 LAB — TYPE AND SCREEN
ABO/RH(D): O POS
Antibody Screen: NEGATIVE
Unit division: 0
Unit division: 0

## 2018-06-18 LAB — BPAM RBC
Blood Product Expiration Date: 202007052359
Blood Product Expiration Date: 202007052359
ISSUE DATE / TIME: 202006080939
ISSUE DATE / TIME: 202006080939
Unit Type and Rh: 5100
Unit Type and Rh: 5100

## 2018-06-19 ENCOUNTER — Other Ambulatory Visit: Payer: Medicare Other

## 2018-06-19 ENCOUNTER — Ambulatory Visit: Payer: Medicare Other

## 2018-07-06 ENCOUNTER — Emergency Department (HOSPITAL_COMMUNITY)
Admission: EM | Admit: 2018-07-06 | Discharge: 2018-07-06 | Disposition: A | Discharge status: Home / Self Care | Payer: Medicare Other | Attending: Emergency Medicine | Admitting: Emergency Medicine

## 2018-07-06 ENCOUNTER — Encounter (HOSPITAL_COMMUNITY): Payer: Self-pay | Admitting: Emergency Medicine

## 2018-07-06 ENCOUNTER — Other Ambulatory Visit: Payer: Self-pay

## 2018-07-06 DIAGNOSIS — Z992 Dependence on renal dialysis: Secondary | ICD-10-CM | POA: Insufficient documentation

## 2018-07-06 DIAGNOSIS — N186 End stage renal disease: Secondary | ICD-10-CM | POA: Insufficient documentation

## 2018-07-06 DIAGNOSIS — Z85830 Personal history of malignant neoplasm of bone: Secondary | ICD-10-CM | POA: Insufficient documentation

## 2018-07-06 DIAGNOSIS — I252 Old myocardial infarction: Secondary | ICD-10-CM | POA: Diagnosis not present

## 2018-07-06 DIAGNOSIS — Z951 Presence of aortocoronary bypass graft: Secondary | ICD-10-CM | POA: Diagnosis not present

## 2018-07-06 DIAGNOSIS — R195 Other fecal abnormalities: Secondary | ICD-10-CM

## 2018-07-06 DIAGNOSIS — Z7982 Long term (current) use of aspirin: Secondary | ICD-10-CM | POA: Diagnosis not present

## 2018-07-06 DIAGNOSIS — Z8546 Personal history of malignant neoplasm of prostate: Secondary | ICD-10-CM | POA: Diagnosis not present

## 2018-07-06 DIAGNOSIS — Z79899 Other long term (current) drug therapy: Secondary | ICD-10-CM | POA: Insufficient documentation

## 2018-07-06 DIAGNOSIS — I120 Hypertensive chronic kidney disease with stage 5 chronic kidney disease or end stage renal disease: Secondary | ICD-10-CM | POA: Insufficient documentation

## 2018-07-06 DIAGNOSIS — E1122 Type 2 diabetes mellitus with diabetic chronic kidney disease: Secondary | ICD-10-CM | POA: Insufficient documentation

## 2018-07-06 DIAGNOSIS — Z87891 Personal history of nicotine dependence: Secondary | ICD-10-CM | POA: Insufficient documentation

## 2018-07-06 DIAGNOSIS — R531 Weakness: Secondary | ICD-10-CM | POA: Diagnosis present

## 2018-07-06 DIAGNOSIS — D649 Anemia, unspecified: Secondary | ICD-10-CM | POA: Diagnosis not present

## 2018-07-06 LAB — CBC
HCT: 18 % — ABNORMAL LOW (ref 39.0–52.0)
Hemoglobin: 5.5 g/dL — CL (ref 13.0–17.0)
MCH: 27 pg (ref 26.0–34.0)
MCHC: 30.6 g/dL (ref 30.0–36.0)
MCV: 88.2 fL (ref 80.0–100.0)
Platelets: 274 10*3/uL (ref 150–400)
RBC: 2.04 MIL/uL — ABNORMAL LOW (ref 4.22–5.81)
RDW: 16.3 % — ABNORMAL HIGH (ref 11.5–15.5)
WBC: 5.3 10*3/uL (ref 4.0–10.5)
nRBC: 0 % (ref 0.0–0.2)

## 2018-07-06 LAB — BASIC METABOLIC PANEL
Anion gap: 12 (ref 5–15)
BUN: 11 mg/dL (ref 8–23)
CO2: 29 mmol/L (ref 22–32)
Calcium: 8.4 mg/dL — ABNORMAL LOW (ref 8.9–10.3)
Chloride: 99 mmol/L (ref 98–111)
Creatinine, Ser: 1.85 mg/dL — ABNORMAL HIGH (ref 0.61–1.24)
GFR calc Af Amer: 37 mL/min — ABNORMAL LOW (ref >60)
GFR calc non Af Amer: 32 mL/min — ABNORMAL LOW (ref >60)
Glucose, Bld: 111 mg/dL — ABNORMAL HIGH (ref 70–99)
Potassium: 3.1 mmol/L — ABNORMAL LOW (ref 3.5–5.1)
Sodium: 140 mmol/L (ref 135–145)

## 2018-07-06 LAB — POC OCCULT BLOOD, ED: Fecal Occult Bld: POSITIVE — AB

## 2018-07-06 LAB — PREPARE RBC (CROSSMATCH)

## 2018-07-06 MED ORDER — SODIUM CHLORIDE 0.9 % IV SOLN
10.0000 mL/h | Freq: Once | INTRAVENOUS | Status: AC
  Administered 2018-07-06: 10 mL/h via INTRAVENOUS

## 2018-07-06 MED ORDER — PANTOPRAZOLE SODIUM 40 MG PO TBEC: 40.0000 mg | DELAYED_RELEASE_TABLET | Freq: Every day | ORAL | 0 refills | Status: DC

## 2018-07-06 NOTE — ED Triage Notes (Addendum)
Pt is dialysis pt. Comes here for blood transfusion, states he was supposed to get it done at [facility] but his appointment is too far away. Denies dark tarry stool, denies bloody emesis. Denies any Covid symptoms. Pt denies feeling dizzy or fatigued.

## 2018-07-06 NOTE — ED Notes (Signed)
Please call pt's wife at [phone] to update her on his condition.

## 2018-07-06 NOTE — Discharge Instructions (Addendum)
It was our pleasure to provide your ER care today - we hope that you feel better.  You were given two units of blood today. Take protonix (acid blocker therapy).  For the heme positive stools (blood in stool) - follow up closely with your doctor/GI doctor this coming week - call office Monday for appointment. Also follow up closely with your kidney doctors at your next dialysis.   Return to ER right away if worse, new symptoms, faint/weak, increased trouble breathing, fevers, other concern.

## 2018-07-06 NOTE — ED Notes (Signed)
ED Provider at bedside. 

## 2018-07-06 NOTE — ED Notes (Signed)
Blood consent e-signed at this time.

## 2018-07-06 NOTE — ED Notes (Signed)
No transfusion reaction noted

## 2018-07-06 NOTE — ED Provider Notes (Signed)
[facility] EMERGENCY DEPARTMENT Provider Note   CSN: [id number] Arrival date & time: 07/06/18  1040     History   Chief Complaint Chief Complaint  Patient presents with  . Weakness    HPI [name] [name] is a 85 y.o. male.     Patient with hx esrd/hd, states hemoglobin 5, feeling weak, tired, easily fatigued, lightheaded when stands, and was sent from dialysis to ED today to get transfusion. Symptoms gradual onset, constant, persistent, slowly worsening. Patient notes in past when they test stool, it has been heme positive. He denies melena or rectal bleeding. No abd pain. Denies other blood loss. No fever or chills. Denies pain. No headaches. No chest pain. Mild dyspnea with exertion. No swelling. States had normal dialysis this AM.   The history is provided by the patient.    Past Medical History:  Diagnosis Date  . Anemia of chronic disease   . Coronary artery disease   . Dialysis patient ([place])    T, TH, Sat  . Dyslipidemia   . ESRD (end stage renal disease) on dialysis [facility])    "Industrial; T, Thurs, Sat" (12/26/2012)  . Gout   . History of blood transfusion   . Hypertension   . Metastatic cancer to bone ([place]) dx'd 04/04/17  . MYOCARDIAL INFARCTION, ACUTE, NON-Q WAVE 04/26/2009   Qualifier: Diagnosis of  By: Percival Spanish, MD, Farrel Gordon    . Obesity   . Prostate CA ([place]) dx'd 12/2014   initial dx  . Type II diabetes mellitus ([address])    "diet controlled" (12/26/2012)    Patient Active Problem List   Diagnosis Date Noted  . Coronary artery disease involving native coronary artery of native heart without angina pectoris 09/24/2017  . Prostate CA ([place]) 09/07/2017  . Anemia 05/29/2015  . Occult blood in stools 05/29/2015  . Anemia of chronic kidney failure 08/15/2014  . Shortness of breath   . Symptomatic anemia 06/11/2014  . 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 End-stage renal disease (ESRD) ([place]) 12/30/2012  . Secondary hyperparathyroidism ([place]) 12/30/2012  .  Hypotension 06/16/2011  . Palpitations 06/16/2011  . CAD (coronary artery disease) of artery bypass graft 04/26/2009  . DYSPNEA ON EXERTION 03/04/2009  . HLD (hyperlipidemia) 03/03/2009  . GOUT 03/03/2009  . Obesity 03/03/2009  . Essential hypertension 03/03/2009    Past Surgical History:  Procedure Laterality Date  . ARTERIOVENOUS GRAFT PLACEMENT Right 12/2008   "upper arm"  . AV FISTULA PLACEMENT    . BIOPSY  07/09/2017   Procedure: BIOPSY;  Surgeon: Wonda Horner, MD;  Location: [facility] ENDOSCOPY;  Service: Endoscopy;;  . CARDIAC CATHETERIZATION  12/30/2012  . COLONOSCOPY W/ POLYPECTOMY    . CORONARY ARTERY BYPASS GRAFT N/A 12/31/2012   Procedure: CORONARY ARTERY BYPASS GRAFTING times four using left internal mammary and right saphenous vein;  Surgeon: Grace Isaac, MD;  Location: [facility];  Service: Open Heart Surgery;  Laterality: N/A;  . ESOPHAGOGASTRODUODENOSCOPY (EGD) WITH PROPOFOL N/A 07/09/2017   Procedure: ESOPHAGOGASTRODUODENOSCOPY (EGD) WITH PROPOFOL;  Surgeon: Wonda Horner, MD;  Location: [facility] ENDOSCOPY;  Service: Endoscopy;  Laterality: N/A;  . EYE SURGERY Bilateral    cataract  . INTRAOPERATIVE TRANSESOPHAGEAL ECHOCARDIOGRAM N/A 12/31/2012   Procedure: INTRAOPERATIVE TRANSESOPHAGEAL ECHOCARDIOGRAM;  Surgeon: Grace Isaac, MD;  Location: [facility];  Service: Open Heart Surgery;  Laterality: N/A;  . LEFT HEART CATHETERIZATION WITH CORONARY ANGIOGRAM N/A 12/30/2012   Procedure: LEFT HEART CATHETERIZATION WITH CORONARY ANGIOGRAM;  Surgeon: Annita Brod  Angelena Form, MD;  Location: [facility] CATH LAB;  Service: Cardiovascular;  Laterality: N/A;  . REVISON OF ARTERIOVENOUS FISTULA Right 02/06/2018   Procedure: REVISION PLICATION OF ARTERIOVENOUS FISTULA RIGHT ARM;  Surgeon: Waynetta Sandy, MD;  Location: [facility];  Service: Vascular;  Laterality: Right;  . REVISON OF ARTERIOVENOUS FISTULA Right 04/03/2018   Procedure: REVISION OF ARTERIOVENOUS FISTULA RIGHT ARM;  Surgeon: Rosetta Posner, MD;  [facility] OR;  Service: Vascular;  Laterality: Right;        Home Medications    Prior to Admission medications   Medication Sig Start Date End Date Taking? Authorizing Provider  amLODipine (NORVASC) 5 MG tablet TAKE 1 TABLET BY MOUTH DAILY 06/10/18   Almyra Deforest, PA  aspirin EC 81 MG tablet Take 1 tablet (81 mg total) by mouth daily. Patient taking differently: Take 81 mg by mouth every evening.  05/07/17   Almyra Deforest, PA  atorvastatin (LIPITOR) 20 MG tablet TAKE 1 TABLET BY MOUTH DAILY 06/10/18   Almyra Deforest, PA  B Complex-C-Folic Acid (DIALYVITE 409) 0.8 MG TABS Take 1 tablet by mouth every evening.  08/07/17   [provider]  cinacalcet (SENSIPAR) 60 MG tablet Take 120 mg by mouth every evening.     [provider]  latanoprost (XALATAN) 0.005 % ophthalmic solution Place 1 drop into both eyes at bedtime.    [provider]  leuprolide (LUPRON) 30 MG injection Inject 30 mg into the muscle every 4 (four) months.    [provider]  oxyCODONE-acetaminophen (PERCOCET) 5-325 MG tablet Take 1 tablet by mouth every 6 (six) hours as needed for severe pain. 04/03/18   Ulyses Amor, PA-C  XTANDI 40 MG capsule TAKE 4 CAPSULES (160 MG TOTAL) BY MOUTH DAILY. 06/04/18   Wyatt Portela, MD    Family History Family History  Problem Relation Age of Onset  . Stroke Mother   . Diabetes Sister     Social History Social History   Tobacco Use  . Smoking status: Former Smoker    Packs/day: 1.00    Years: 40.00    Pack years: 40.00    Types: Cigarettes    Quit date: 06/16/1995    Years since quitting: 23.0  . Smokeless tobacco: Never Used  Substance Use Topics  . Alcohol use: No    Alcohol/week: 0.0 standard drinks  . Drug use: No     Allergies   Patient has no known allergies.   Review of Systems Review of Systems  Constitutional: Negative for fever.  HENT: Negative for nosebleeds and sore throat.   Eyes: Negative for redness.  Respiratory:  Negative for cough and shortness of breath.   Cardiovascular: Negative for chest pain.  Gastrointestinal: Negative for abdominal pain, diarrhea and vomiting.  Endocrine: Negative for polyuria.  Genitourinary: Negative for hematuria.  Musculoskeletal: Negative for back pain and neck pain.  Skin: Negative for rash.  Neurological: Positive for weakness and light-headedness. Negative for speech difficulty, numbness and headaches.  Hematological: Does not bruise/bleed easily.  Psychiatric/Behavioral: Negative for confusion.     Physical Exam Updated Vital Signs BP (!) 107/34   Pulse (!) 25   Temp 97.8 F (36.6 C)   Resp 19   SpO2 96%   Physical Exam Vitals signs and nursing note reviewed.  Constitutional:      Appearance: Normal appearance. He is well-developed.  HENT:     Head: Atraumatic.     Nose: Nose normal.     Mouth/Throat:  Mouth: Mucous membranes are moist.     Pharynx: Oropharynx is clear.  Eyes:     General: No scleral icterus.    Conjunctiva/sclera: Conjunctivae normal.     Pupils: Pupils are equal, round, and reactive to light.  Neck:     Musculoskeletal: Normal range of motion and neck supple. No neck rigidity.     Trachea: No tracheal deviation.  Cardiovascular:     Rate and Rhythm: Normal rate and regular rhythm.     Pulses: Normal pulses.     Heart sounds: Normal heart sounds. No murmur. No friction rub. No gallop.   Pulmonary:     Effort: Pulmonary effort is normal. No accessory muscle usage or respiratory distress.     Breath sounds: Normal breath sounds.  Abdominal:     General: Bowel sounds are normal. There is no distension.     Palpations: Abdomen is soft. There is no mass.     Tenderness: There is no abdominal tenderness. There is no guarding.  Genitourinary:    Comments: No cva tenderness. Medium to dark brown stool, heme positive.  Musculoskeletal:        General: No swelling.     Right lower leg: No edema.     Left lower leg: No edema.      Comments: Right upper arm hd fistula w palp thrill, bandage in place, clean/dry.   Skin:    General: Skin is warm and dry.     Findings: No rash.  Neurological:     Mental Status: He is alert.     Comments: Alert, speech clear.   Psychiatric:        Mood and Affect: Mood normal.      ED Treatments / Results  Labs (all labs ordered are listed, but only abnormal results are displayed) Results for orders placed or performed during the hospital encounter of 07/06/18  CBC  Result Value Ref Range   WBC 5.3 4.0 - 10.5 K/uL   RBC 2.04 (L) 4.22 - 5.81 MIL/uL   Hemoglobin 5.5 (LL) 13.0 - 17.0 g/dL   HCT 18.0 (L) 39.0 - 52.0 %   MCV 88.2 80.0 - 100.0 fL   MCH 27.0 26.0 - 34.0 pg   MCHC 30.6 30.0 - 36.0 g/dL   RDW 16.3 (H) 11.5 - 15.5 %   Platelets 274 150 - 400 K/uL   nRBC 0.0 0.0 - 0.2 %  Basic metabolic panel  Result Value Ref Range   Sodium 140 135 - 145 mmol/L   Potassium 3.1 (L) 3.5 - 5.1 mmol/L   Chloride 99 98 - 111 mmol/L   CO2 29 22 - 32 mmol/L   Glucose, Bld 111 (H) 70 - 99 mg/dL   BUN 11 8 - 23 mg/dL   Creatinine, Ser 1.85 (H) 0.61 - 1.24 mg/dL   Calcium 8.4 (L) 8.9 - 10.3 mg/dL   GFR calc non Af Amer 32 (L) >60 mL/min   GFR calc Af Amer 37 (L) >60 mL/min   Anion gap 12 5 - 15  POC occult blood, ED RN will collect  Result Value Ref Range   Fecal Occult Bld POSITIVE (A) NEGATIVE  Type and screen  Result Value Ref Range   ABO/RH(D) O POS    Antibody Screen NEG    Sample Expiration 07/09/2018,2359    Unit Number [id number]    Blood Component Type RED CELLS,LR    Unit division 00    Status of Unit ISSUED  Transfusion Status OK TO TRANSFUSE    Crossmatch Result Compatible    Unit Number [id number]    Blood Component Type RBC LR PHER1    Unit division 00    Status of Unit ISSUED    Transfusion Status OK TO TRANSFUSE    Crossmatch Result      Compatible Performed at [facility], [address] [place], [place] [address]   BPAM [facility]  Result  Value Ref Range   ISSUE DATE / TIME 009233007622    Blood Product Unit Number [id number]    PRODUCT CODE E0336V00    Unit Type and Rh 5100    Blood Product Expiration Date 937342876811    ISSUE DATE / TIME 572620355974    Blood Product Unit Number [id number]    PRODUCT CODE O1224M25    Unit Type and Rh 5100    Blood Product Expiration Date 003704888916     EKG    Radiology No results found.  Procedures Procedures (including critical care time)  Medications Ordered in ED Medications  0.9 %  sodium chloride infusion (has no administration in time range)     Initial Impression / Assessment and Plan / ED Course  I have reviewed the triage vital signs and the nursing notes.  Pertinent labs & imaging results that were available during my care of the patient were reviewed by me and considered in my medical decision making (see chart for details).  Iv ns. Labs sent. Continuous pulse ox and monitor.  Reviewed nursing notes and prior charts for additional history.   Labs reviewed by me - hgb very low, 5. Blood pressure is low/soft, 100/40, and patient symptomatic, faint when stands.   Pt indicates does not want to be admitted, states prior transfusion as outpatient, pt requests same.   Will emergently transfuse 2 units prbc due to severe, symptomatic anemia.  Additional labs reviewed by me - CRF. k is mildly low, s/p recent dialysis.  CRITICAL CARE RE: severe, symptomatic anemia w soft BP, need for emergent transfusion, ESRD/HD.  Performed by: Mirna Mires Total critical care time: 40 minutes Critical care time was exclusive of separately billable procedures and treating other patients. Critical care was necessary to treat or prevent imminent or life-threatening deterioration. Critical care was time spent personally by me on the following activities: development of treatment plan with patient and/or surrogate as well as nursing, discussions with consultants, evaluation  of patient's response to treatment, examination of patient, obtaining history from patient or surrogate, ordering and performing treatments and interventions, ordering and review of laboratory studies, ordering and review of radiographic studies, pulse oximetry and re-evaluation of patient's condition.  Post first unit, is eating and drinking. Denies sob. States is feeling improved from prior. 2nd unit infusing. Pt requests d/c post transfusion, again reiterating he does not want to be admitted.   Recheck, pt continues to feel well. Has eaten. No new c/o.   Rec close pcp, renal and gi f/u.  Return precautions provided.    Final Clinical Impressions(s) / ED Diagnoses   Final diagnoses:  None    ED Discharge Orders    None       Lajean Saver, MD 07/07/18 [phone]

## 2018-07-07 ENCOUNTER — Encounter (HOSPITAL_COMMUNITY): Payer: Self-pay | Admitting: Emergency Medicine

## 2018-07-07 LAB — TYPE AND SCREEN
ABO/RH(D): O POS
Antibody Screen: NEGATIVE
Unit division: 0
Unit division: 0

## 2018-07-07 LAB — BPAM RBC
Blood Product Expiration Date: 202007252359
Blood Product Expiration Date: 202007252359
ISSUE DATE / TIME: 202006271242
ISSUE DATE / TIME: 202006271604
Unit Type and Rh: 5100
Unit Type and Rh: 5100

## 2018-07-08 ENCOUNTER — Other Ambulatory Visit: Payer: Self-pay | Admitting: Oncology

## 2018-07-08 DIAGNOSIS — C61 Malignant neoplasm of prostate: Secondary | ICD-10-CM

## 2018-07-11 MED FILL — XTANDI 40 MG CAPSULE: 40 | 30 days supply | Qty: 120 | Fill #0

## 2018-07-20 ENCOUNTER — Emergency Department (HOSPITAL_COMMUNITY)
Admission: EM | Admit: 2018-07-20 | Discharge: 2018-07-20 | Disposition: A | Discharge status: Home / Self Care | Payer: Medicare Other | Attending: Emergency Medicine | Admitting: Emergency Medicine

## 2018-07-20 ENCOUNTER — Other Ambulatory Visit: Payer: Self-pay

## 2018-07-20 ENCOUNTER — Encounter (HOSPITAL_COMMUNITY): Payer: Self-pay

## 2018-07-20 DIAGNOSIS — D649 Anemia, unspecified: Secondary | ICD-10-CM | POA: Insufficient documentation

## 2018-07-20 DIAGNOSIS — Z7982 Long term (current) use of aspirin: Secondary | ICD-10-CM | POA: Diagnosis not present

## 2018-07-20 DIAGNOSIS — Z951 Presence of aortocoronary bypass graft: Secondary | ICD-10-CM | POA: Insufficient documentation

## 2018-07-20 DIAGNOSIS — I2510 Atherosclerotic heart disease of native coronary artery without angina pectoris: Secondary | ICD-10-CM | POA: Diagnosis not present

## 2018-07-20 DIAGNOSIS — Z8546 Personal history of malignant neoplasm of prostate: Secondary | ICD-10-CM | POA: Insufficient documentation

## 2018-07-20 DIAGNOSIS — N186 End stage renal disease: Secondary | ICD-10-CM | POA: Insufficient documentation

## 2018-07-20 DIAGNOSIS — Z87891 Personal history of nicotine dependence: Secondary | ICD-10-CM | POA: Insufficient documentation

## 2018-07-20 DIAGNOSIS — Z992 Dependence on renal dialysis: Secondary | ICD-10-CM | POA: Diagnosis not present

## 2018-07-20 DIAGNOSIS — I120 Hypertensive chronic kidney disease with stage 5 chronic kidney disease or end stage renal disease: Secondary | ICD-10-CM | POA: Insufficient documentation

## 2018-07-20 DIAGNOSIS — E119 Type 2 diabetes mellitus without complications: Secondary | ICD-10-CM | POA: Diagnosis not present

## 2018-07-20 DIAGNOSIS — Z79899 Other long term (current) drug therapy: Secondary | ICD-10-CM | POA: Diagnosis not present

## 2018-07-20 LAB — PREPARE RBC (CROSSMATCH)

## 2018-07-20 LAB — CBC
HCT: 14.7 % — ABNORMAL LOW (ref 39.0–52.0)
Hemoglobin: 4.5 g/dL — CL (ref 13.0–17.0)
MCH: 26.8 pg (ref 26.0–34.0)
MCHC: 30.6 g/dL (ref 30.0–36.0)
MCV: 87.5 fL (ref 80.0–100.0)
Platelets: 200 10*3/uL (ref 150–400)
RBC: 1.68 MIL/uL — ABNORMAL LOW (ref 4.22–5.81)
RDW: 16.6 % — ABNORMAL HIGH (ref 11.5–15.5)
WBC: 4.7 10*3/uL (ref 4.0–10.5)
nRBC: 0 % (ref 0.0–0.2)

## 2018-07-20 MED ORDER — SODIUM CHLORIDE 0.9 % IV SOLN
10.0000 mL/h | Freq: Once | INTRAVENOUS | Status: AC
  Administered 2018-07-20: 10 mL/h via INTRAVENOUS

## 2018-07-20 NOTE — ED Triage Notes (Signed)
Patient sent from dialysis due to low hemoglobin. Had full treatment today. Alert and oriented, denies pain

## 2018-07-20 NOTE — ED Notes (Signed)
Patient verbalizes understanding of discharge instructions. Opportunity for questioning and answers were provided. Armband removed by staff, pt discharged from ED via wheelchair to home.  

## 2018-07-20 NOTE — ED Provider Notes (Signed)
[facility] EMERGENCY DEPARTMENT Provider Note   CSN: [id number] Arrival date & time: 07/20/18  1026     History   Chief Complaint Chief Complaint  Patient presents with  . blood transfusion    HPI [name] [name] is a 85 y.o. male.  HPI   88yM with anemia. Sent from HD. Did have full session. Hx of anemia and most recently transfused 2u PRBC in the ED on 07/06/18 for hemoglobin of 5.5. He says he feels fine at rest. Feels tired and SOB when ambulating though. Stool has been "tan but they always see blood in it when they check."   Past Medical History:  Diagnosis Date  . Anemia of chronic disease   . Coronary artery disease   . Dialysis patient ([place])    T, TH, Sat  . Dyslipidemia   . ESRD (end stage renal disease) on dialysis [facility])    "Industrial; T, Thurs, Sat" (12/26/2012)  . Gout   . History of blood transfusion   . Hypertension   . Metastatic cancer to bone ([place]) dx'd 04/04/17  . MYOCARDIAL INFARCTION, ACUTE, NON-Q WAVE 04/26/2009   Qualifier: Diagnosis of  By: Percival Spanish, MD, Farrel Gordon    . Obesity   . Prostate CA ([place]) dx'd 12/2014   initial dx  . Type II diabetes mellitus ([place])    "diet controlled" (12/26/2012)    Patient Active Problem List   Diagnosis Date Noted  . Coronary artery disease involving native coronary artery of native heart without angina pectoris 09/24/2017  . Prostate CA ([place]) 09/07/2017  . Anemia 05/29/2015  . Occult blood in stools 05/29/2015  . Anemia of chronic kidney failure 08/15/2014  . Shortness of breath   . Symptomatic anemia 06/11/2014  . 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 End-stage renal disease (ESRD) ([place]) 12/30/2012  . Secondary hyperparathyroidism ([place]) 12/30/2012  . Hypotension 06/16/2011  . Palpitations 06/16/2011  . CAD (coronary artery disease) of artery bypass graft 04/26/2009  . DYSPNEA ON EXERTION 03/04/2009  . HLD (hyperlipidemia) 03/03/2009  . GOUT 03/03/2009  . Obesity 03/03/2009  . Essential  hypertension 03/03/2009    Past Surgical History:  Procedure Laterality Date  . ARTERIOVENOUS GRAFT PLACEMENT Right 12/2008   "upper arm"  . AV FISTULA PLACEMENT    . BIOPSY  07/09/2017   Procedure: BIOPSY;  Surgeon: Wonda Horner, MD;  [facility]: [facility] ENDOSCOPY;  Service: Endoscopy;;  . CARDIAC CATHETERIZATION  12/30/2012  . COLONOSCOPY W/ POLYPECTOMY    . CORONARY ARTERY BYPASS GRAFT N/A 12/31/2012   Procedure: CORONARY ARTERY BYPASS GRAFTING times four using left internal mammary and right saphenous vein;  Surgeon: Grace Isaac, MD;  Location: [facility];  Service: Open Heart Surgery;  Laterality: N/A;  . ESOPHAGOGASTRODUODENOSCOPY (EGD) WITH PROPOFOL N/A 07/09/2017   Procedure: ESOPHAGOGASTRODUODENOSCOPY (EGD) WITH PROPOFOL;  Surgeon: Wonda Horner, MD;  Location: [facility] ENDOSCOPY;  Service: Endoscopy;  Laterality: N/A;  . EYE SURGERY Bilateral    cataract  . INTRAOPERATIVE TRANSESOPHAGEAL ECHOCARDIOGRAM N/A 12/31/2012   Procedure: INTRAOPERATIVE TRANSESOPHAGEAL ECHOCARDIOGRAM;  Surgeon: Grace Isaac, MD;  Location: [place];  Service: Open Heart Surgery;  Laterality: N/A;  . LEFT HEART CATHETERIZATION WITH CORONARY ANGIOGRAM N/A 12/30/2012   Procedure: LEFT HEART CATHETERIZATION WITH CORONARY ANGIOGRAM;  Surgeon: Burnell Blanks, MD;  Location: [facility] CATH LAB;  Service: Cardiovascular;  Laterality: N/A;  . REVISON OF ARTERIOVENOUS FISTULA Right 02/06/2018   Procedure: REVISION PLICATION OF ARTERIOVENOUS FISTULA RIGHT ARM;  Surgeon: Servando Snare  Harrell Gave, MD;  Location: [facility];  Service: Vascular;  Laterality: Right;  . REVISON OF ARTERIOVENOUS FISTULA Right 04/03/2018   Procedure: REVISION OF ARTERIOVENOUS FISTULA RIGHT ARM;  Surgeon: Rosetta Posner, MD;  Location[facility] OR;  Service: Vascular;  Laterality: Right;      Home Medications    Prior to Admission medications   Medication Sig Start Date End Date Taking? Authorizing Provider  amLODipine (NORVASC) 5 MG tablet TAKE 1 TABLET BY  MOUTH DAILY Patient taking differently: Take 5 mg by mouth every evening.  06/10/18   Almyra Deforest, PA  aspirin EC 81 MG tablet Take 1 tablet (81 mg total) by mouth daily. Patient taking differently: Take 81 mg by mouth every evening.  05/07/17   Almyra Deforest, PA  atorvastatin (LIPITOR) 20 MG tablet TAKE 1 TABLET BY MOUTH DAILY Patient taking differently: Take 20 mg by mouth every evening.  06/10/18   Almyra Deforest, PA  B Complex-C-Folic Acid (DIALYVITE 833) 0.8 MG TABS Take 0.8 mg by mouth every evening.  08/07/17   [provider]  cinacalcet (SENSIPAR) 60 MG tablet Take 60 mg by mouth every evening.     [provider]  latanoprost (XALATAN) 0.005 % ophthalmic solution Place 1 drop into both eyes at bedtime.    [provider]  leuprolide (LUPRON) 30 MG injection Inject 30 mg into the muscle every 4 (four) months.    [provider]  oxyCODONE-acetaminophen (PERCOCET) 5-325 MG tablet Take 1 tablet by mouth every 6 (six) hours as needed for severe pain. Patient not taking: Reported on 07/06/2018 04/03/18   Ulyses Amor, PA-C  pantoprazole (PROTONIX) 40 MG tablet Take 1 tablet (40 mg total) by mouth daily. 07/06/18   Lajean Saver, MD  XTANDI 40 MG capsule TAKE 4 CAPSULES (160 MG TOTAL) BY MOUTH DAILY. 07/09/18   Wyatt Portela, MD    Family History Family History  Problem Relation Age of Onset  . Stroke Mother   . Diabetes Sister     Social History Social History   Tobacco Use  . Smoking status: Former Smoker    Packs/day: 1.00    Years: 40.00    Pack years: 40.00    Types: Cigarettes    Quit date: 06/16/1995    Years since quitting: 23.1  . Smokeless tobacco: Never Used  Substance Use Topics  . Alcohol use: No    Alcohol/week: 0.0 standard drinks  . Drug use: No     Allergies   Patient has no known allergies.   Review of Systems Review of Systems  All systems reviewed and negative, other than as noted in HPI.  Physical Exam Updated Vital Signs  BP (!) 104/44   Pulse 75   Temp 97.9 F (36.6 C)   Resp 15   Ht 6' (1.829 m)   Wt 83.9 kg   SpO2 100%   BMI 25.09 kg/m   Physical Exam Vitals signs and nursing note reviewed.  Constitutional:      General: He is not in acute distress.    Appearance: He is well-developed.  HENT:     Head: Normocephalic and atraumatic.  Eyes:     General:        Right eye: No discharge.        Left eye: No discharge.     Comments: Pale conjunctiva  Neck:     Musculoskeletal: Neck supple.  Cardiovascular:     Rate and Rhythm: Normal rate and regular rhythm.  Heart sounds: Normal heart sounds. No murmur. No friction rub. No gallop.   Pulmonary:     Effort: Pulmonary effort is normal. No respiratory distress.     Breath sounds: Normal breath sounds.  Abdominal:     General: There is no distension.     Palpations: Abdomen is soft.     Tenderness: There is no abdominal tenderness.  Musculoskeletal:        General: No tenderness.  Skin:    General: Skin is warm and dry.  Neurological:     Mental Status: He is alert.  Psychiatric:        Behavior: Behavior normal.        Thought Content: Thought content normal.      ED Treatments / Results  Labs (all labs ordered are listed, but only abnormal results are displayed) Labs Reviewed  CBC - Abnormal; Notable for the following components:      Result Value   RBC 1.68 (*)    Hemoglobin 4.5 (*)    HCT 14.7 (*)    RDW 16.6 (*)    All other components within normal limits  TYPE AND SCREEN  PREPARE RBC (CROSSMATCH)    EKG None  Radiology No results found.  Procedures Procedures (including critical care time)  CRITICAL CARE Performed by: Virgel Manifold Total critical care time: 35 minutes Critical care time was exclusive of separately billable procedures and treating other patients. Critical care was necessary to treat or prevent imminent or life-threatening deterioration. Severe symptomatic anemia with soft blood pressure  requiring blood transfusion. Critical care was time spent personally by me on the following activities: development of treatment plan with patient and/or surrogate as well as nursing, discussions with consultants, evaluation of patient's response to treatment, examination of patient, obtaining history from patient or surrogate, ordering and performing treatments and interventions, ordering and review of laboratory studies, ordering and review of radiographic studies, pulse oximetry and re-evaluation of patient's condition.  Medications Ordered in ED Medications  0.9 %  sodium chloride infusion (has no administration in time range)     Initial Impression / Assessment and Plan / ED Course  I have reviewed the triage vital signs and the nursing notes.  Pertinent labs & imaging results that were available during my care of the patient were reviewed by me and considered in my medical decision making (see chart for details).     88yM with symptomatic anemia. Will transfuse 3u PRBCs. Pt not agreeable to admission. Says he was transfused in the ED previously and has also been transfused as an outpt. He understands that he will be in the ED well into the evening. Anemia is chronic. Related to malignancy and treatment with components of Fe deficiency and renal failure. He is followed as an outpt with oncology that is managing this. Stool has been consistently been heme positive but there is no emergent need to address this in the hospital at this time.   Final Clinical Impressions(s) / ED Diagnoses   Final diagnoses:  Symptomatic anemia    ED Discharge Orders    None       Virgel Manifold, MD 07/24/18 1038

## 2018-07-20 NOTE — ED Notes (Signed)
Pt wife Earlie Server would like an update [phone]

## 2018-07-20 NOTE — ED Provider Notes (Signed)
  Physical Exam  BP (!) 104/52   Pulse 73   Temp 98.5 F (36.9 C) (Oral)   Resp 16   Ht 6' (1.829 m)   Wt 83.9 kg   SpO2 99%   BMI 25.09 kg/m   Physical Exam  ED Course/Procedures     Procedures  MDM  Care assumed at 3 pm. Patient has hx of anemia here with symptomatic anemia. Hg 4.5. No active bleeding. Offered admission but patient refused and wants to get blood and go home.   8:05 PM  Received 3 U PRBC in the ED. Felt better. No transfusion reactions currently. Updated family, who will pick him up. DC paperwork done by Dr. Anette Guarneri, Lujean Rave, MD 07/20/18 2006

## 2018-07-21 LAB — TYPE AND SCREEN
ABO/RH(D): O POS
Antibody Screen: NEGATIVE
Unit division: 0
Unit division: 0
Unit division: 0

## 2018-07-21 LAB — BPAM RBC
Blood Product Expiration Date: 202008112359
Blood Product Expiration Date: 202008112359
Blood Product Expiration Date: 202008112359
ISSUE DATE / TIME: 202007111205
ISSUE DATE / TIME: 202007111348
ISSUE DATE / TIME: 202007111642
Unit Type and Rh: 5100
Unit Type and Rh: 5100
Unit Type and Rh: 5100

## 2018-08-05 ENCOUNTER — Other Ambulatory Visit: Payer: Self-pay | Admitting: Oncology

## 2018-08-05 DIAGNOSIS — C61 Malignant neoplasm of prostate: Secondary | ICD-10-CM

## 2018-08-07 ENCOUNTER — Inpatient Hospital Stay: Payer: Medicare Other

## 2018-08-07 ENCOUNTER — Telehealth: Payer: Self-pay | Admitting: Oncology

## 2018-08-07 ENCOUNTER — Inpatient Hospital Stay: Payer: Medicare Other | Attending: Oncology

## 2018-08-07 ENCOUNTER — Telehealth: Payer: Self-pay

## 2018-08-07 ENCOUNTER — Other Ambulatory Visit: Payer: Self-pay

## 2018-08-07 ENCOUNTER — Inpatient Hospital Stay (HOSPITAL_BASED_OUTPATIENT_CLINIC_OR_DEPARTMENT_OTHER): Payer: Medicare Other | Admitting: Oncology

## 2018-08-07 VITALS — BP 98/41 | HR 75 | Temp 96.9°F | Resp 18 | Wt 162.0 lb

## 2018-08-07 DIAGNOSIS — N19 Unspecified kidney failure: Secondary | ICD-10-CM

## 2018-08-07 DIAGNOSIS — D649 Anemia, unspecified: Secondary | ICD-10-CM

## 2018-08-07 DIAGNOSIS — D630 Anemia in neoplastic disease: Secondary | ICD-10-CM

## 2018-08-07 DIAGNOSIS — C61 Malignant neoplasm of prostate: Secondary | ICD-10-CM | POA: Diagnosis present

## 2018-08-07 DIAGNOSIS — N289 Disorder of kidney and ureter, unspecified: Secondary | ICD-10-CM | POA: Diagnosis not present

## 2018-08-07 DIAGNOSIS — Z7189 Other specified counseling: Secondary | ICD-10-CM

## 2018-08-07 LAB — CBC WITH DIFFERENTIAL (CANCER CENTER ONLY)
Abs Immature Granulocytes: 0 10*3/uL (ref 0.00–0.07)
Basophils Absolute: 0 10*3/uL (ref 0.0–0.1)
Basophils Relative: 1 %
Eosinophils Absolute: 0 10*3/uL (ref 0.0–0.5)
Eosinophils Relative: 1 %
HCT: 30.6 % — ABNORMAL LOW (ref 39.0–52.0)
Hemoglobin: 9.2 g/dL — ABNORMAL LOW (ref 13.0–17.0)
Immature Granulocytes: 0 %
Lymphocytes Relative: 17 %
Lymphs Abs: 0.5 10*3/uL — ABNORMAL LOW (ref 0.7–4.0)
MCH: 27.9 pg (ref 26.0–34.0)
MCHC: 30.1 g/dL (ref 30.0–36.0)
MCV: 92.7 fL (ref 80.0–100.0)
Monocytes Absolute: 0.4 10*3/uL (ref 0.1–1.0)
Monocytes Relative: 14 %
Neutro Abs: 2.1 10*3/uL (ref 1.7–7.7)
Neutrophils Relative %: 67 %
Platelet Count: 276 10*3/uL (ref 150–400)
RBC: 3.3 MIL/uL — ABNORMAL LOW (ref 4.22–5.81)
RDW: 22.6 % — ABNORMAL HIGH (ref 11.5–15.5)
WBC Count: 3.1 10*3/uL — ABNORMAL LOW (ref 4.0–10.5)
nRBC: 0 % (ref 0.0–0.2)

## 2018-08-07 LAB — IRON AND TIBC
Iron: 45 ug/dL (ref 42–163)
Saturation Ratios: 11 % — ABNORMAL LOW (ref 20–55)
TIBC: 395 ug/dL (ref 202–409)
UIBC: 350 ug/dL (ref 117–376)

## 2018-08-07 LAB — CMP (CANCER CENTER ONLY)
ALT: 9 U/L (ref 0–44)
AST: 21 U/L (ref 15–41)
Albumin: 2.8 g/dL — ABNORMAL LOW (ref 3.5–5.0)
Alkaline Phosphatase: 85 U/L (ref 38–126)
Anion gap: 12 (ref 5–15)
BUN: 19 mg/dL (ref 8–23)
CO2: 27 mmol/L (ref 22–32)
Calcium: 8.1 mg/dL — ABNORMAL LOW (ref 8.9–10.3)
Chloride: 99 mmol/L (ref 98–111)
Creatinine: 3.63 mg/dL (ref 0.61–1.24)
GFR, Est AFR Am: 17 mL/min — ABNORMAL LOW (ref >60)
GFR, Estimated: 14 mL/min — ABNORMAL LOW (ref >60)
Glucose, Bld: 89 mg/dL (ref 70–99)
Potassium: 4 mmol/L (ref 3.5–5.1)
Sodium: 138 mmol/L (ref 135–145)
Total Bilirubin: 0.6 mg/dL (ref 0.3–1.2)
Total Protein: 6.6 g/dL (ref 6.5–8.1)

## 2018-08-07 LAB — FERRITIN: Ferritin: 154 ng/mL (ref 24–336)

## 2018-08-07 LAB — SAMPLE TO BLOOD BANK

## 2018-08-07 NOTE — Telephone Encounter (Signed)
Called and spoke with patients wife. Confirmed appts °

## 2018-08-07 NOTE — Progress Notes (Signed)
Hematology and Oncology Follow Up Visit  [name] [name] [id number] 09/30/1930 85 y.o. 08/07/2018 8:22 AM [name], MDElkins, [name], *   Principle Diagnosis: 85 year old man with:  1.  Advanced prostate cancer with adenopathy diagnosed in 2017.  He has castration-resistant with Gleason score 4+4 = 8 PSA of 369 at the time of diagnosis.  2.  Anemia: Due to malignancy, renal insufficiency and possible early myelodysplasia.  Prior Therapy: Androgen deprivation therapy and he remains on it under the care of Dr. Diona Fanti. His PSA decreased to 31 in 2017. His PSA in November 2017 was 41 and in March 2018 was 29.    Current therapy:   Xtandi 160 mg daily started in March 2019.  Supportive transfusion packed red cells.  Interim History: [name] [name] is here for return evaluation.  Since the last visit, he reports no major changes in his health.  He did require multiple packed red cell transfusion after presenting on July 11 with a hemoglobin of 4.5.  Since that time, he feels reasonably well without any complaints of fatigue, tiredness or dyspnea.  He still able to ambulate without any difficulties.  He denies any recent falls or syncope.  He denies any complications related to [facility].  He denies any dysuria or hematuria.  He denies any bone pain.   He denied any alteration mental status, neuropathy, confusion or dizziness.  Denies any headaches or lethargy.  Denies any night sweats, weight loss or changes in appetite.  Denied orthopnea, dyspnea on exertion or chest discomfort.  Denies shortness of breath, difficulty breathing hemoptysis or cough.  Denies any abdominal distention, nausea, early satiety or dyspepsia.  Denies any hematuria, frequency, dysuria or nocturia.  Denies any skin irritation, dryness or rash.  Denies any ecchymosis or petechiae.  Denies any lymphadenopathy or clotting.  Denies any heat or cold intolerance.  Denies any anxiety or depression.  Remaining review of  system is negative.          .    Medications: Updated on reviewed today. Current Outpatient Medications  Medication Sig Dispense Refill  . amLODipine (NORVASC) 5 MG tablet TAKE 1 TABLET BY MOUTH DAILY (Patient taking differently: Take 5 mg by mouth every evening. ) 90 tablet 1  . aspirin EC 81 MG tablet Take 1 tablet (81 mg total) by mouth daily. (Patient taking differently: Take 81 mg by mouth every evening. ) 90 tablet 3  . atorvastatin (LIPITOR) 20 MG tablet TAKE 1 TABLET BY MOUTH DAILY (Patient taking differently: Take 20 mg by mouth every evening. ) 90 tablet 1  . B Complex-C-Folic Acid (DIALYVITE 631) 0.8 MG TABS Take 0.8 mg by mouth every evening.   3  . cinacalcet (SENSIPAR) 60 MG tablet Take 60 mg by mouth every evening.     . latanoprost (XALATAN) 0.005 % ophthalmic solution Place 1 drop into both eyes at bedtime.    Marland Kitchen leuprolide (LUPRON) 30 MG injection Inject 30 mg into the muscle every 4 (four) months.    Marland Kitchen oxyCODONE-acetaminophen (PERCOCET) 5-325 MG tablet Take 1 tablet by mouth every 6 (six) hours as needed for severe pain. (Patient not taking: Reported on 07/20/2018) 10 tablet 0  . pantoprazole (PROTONIX) 40 MG tablet Take 1 tablet (40 mg total) by mouth daily. (Patient taking differently: Take 40 mg by mouth every evening. ) 30 tablet 0  . XTANDI 40 MG capsule TAKE 4 CAPSULES (160 MG TOTAL) BY MOUTH DAILY. 120 capsule 0   No current facility-administered  medications for this visit.      Allergies: No Known Allergies  Past Medical History, Surgical history, Social history, and Family History unchanged on review.  Physical Exam:   Blood pressure (!) 98/41, pulse 75, temperature (!) 96.9 F (36.1 C), temperature source Temporal, resp. rate 18, weight 162 lb (73.5 kg), SpO2 100 %.       ECOG: 1   General appearance: Comfortable appearing without any discomfort Head: Normocephalic without any trauma Oropharynx: Mucous membranes are moist and pink  without any thrush or ulcers. Eyes: Pupils are equal and round reactive to light. Lymph nodes: No cervical, supraclavicular, inguinal or axillary lymphadenopathy.   Heart:regular rate and rhythm.  S1 and S2 without leg edema. Lung: Clear without any rhonchi or wheezes.  No dullness to percussion. Abdomin: Soft, nontender, nondistended with good bowel sounds.  No hepatosplenomegaly. Musculoskeletal: No joint deformity or effusion.  Full range of motion noted. Neurological: No deficits noted on motor, sensory and deep tendon reflex exam. Skin: No petechial rash or dryness.  Appeared moist.            Lab Results: Lab Results  Component Value Date   WBC 4.7 07/20/2018   HGB 4.5 (LL) 07/20/2018   HCT 14.7 (L) 07/20/2018   MCV 87.5 07/20/2018   PLT 200 07/20/2018     Chemistry      Component Value Date/Time   NA 140 07/06/2018 1120   NA 143 09/29/2016 0810   K 3.1 (L) 07/06/2018 1120   K 4.3 09/29/2016 0810   CL 99 07/06/2018 1120   CO2 29 07/06/2018 1120   CO2 31 (H) 09/29/2016 0810   BUN 11 07/06/2018 1120   BUN 27.9 (H) 09/29/2016 0810   CREATININE 1.85 (H) 07/06/2018 1120   CREATININE 3.50 (HH) 05/29/2018 0812   CREATININE 7.4 (HH) 09/29/2016 0810      Component Value Date/Time   CALCIUM 8.4 (L) 07/06/2018 1120   CALCIUM 9.9 09/29/2016 0810   ALKPHOS 82 05/29/2018 0812   ALKPHOS 141 09/29/2016 0810   AST 18 05/29/2018 0812   AST 21 09/29/2016 0810   ALT 8 05/29/2018 0812   ALT 10 09/29/2016 0810   BILITOT 0.5 05/29/2018 0812   BILITOT 0.48 09/29/2016 0810          Results for AIZIK, REH (MRN [id number]) as of 08/07/2018 08:24  Ref. Range 12/19/2017 09:36 03/27/2018 09:07 05/29/2018 08:12  Prostate Specific Ag, Serum Latest Ref Range: 0.0 - 4.0 ng/mL 0.3 0.1 <0.1         Impression and Plan:   86 year old man with:  1.  Advanced prostate cancer that is currently castration-resistant with pelvic adenopathy.   He continues to tolerate Xtandi  reasonably well with excellent PSA response that is currently undetectable.  Risks and benefits of continuing this treatment was discussed today.  Treatment break and attempt to decrease the amount of packed red cell transfusion could be attempted.  At this time I do not feel that [name] [name] is responsible for his anemia and he is agreeable to continue at this time.  2. Androgen deprivation therapy: I recommended continuing this indefinitely.  He is receiving that under the care of Dr. Diona Fanti.   3.  Anemia: Multifactorial in nature related to his malignancy, renal insufficiency possible MDS and Xtandi.  His hemoglobin is adequate today at 9.2 and does not require transfusion.  4.  Prognosis and goals of care: Therapy is palliative at this time although his performance status appears reasonable  and aggressive measures are warranted.  5.  Renal failure: no issues with dialysis at this time.  6. Follow-up: Every 3 weeks for laboratory test as well as possible transfusion if needed.  25  minutes was spent with the patient face-to-face today.  More than 50% of time was spent on reviewing his disease status, treatment options and answering questions regarding future plan of care.  Zola Button, MD 7/29/20208:22 AM

## 2018-08-07 NOTE — Telephone Encounter (Signed)
Dr. Alen Blew made aware that lab called to report patient's creatinine 3.63. No new orders received.

## 2018-08-08 ENCOUNTER — Telehealth: Payer: Self-pay

## 2018-08-08 LAB — PROSTATE-SPECIFIC AG, SERUM (LABCORP): Prostate Specific Ag, Serum: <0.1 ng/mL (ref 0.0–4.0)

## 2018-08-08 MED FILL — XTANDI 40 MG CAPSULE: 40 | 30 days supply | Qty: 120 | Fill #0

## 2018-08-08 NOTE — Telephone Encounter (Signed)
-----   Message from Wyatt Portela, MD sent at 08/08/2018  7:59 AM EDT ----- Please let him know his PSA is low.

## 2018-08-08 NOTE — Telephone Encounter (Signed)
Contacted patient and left message with PSA results and to call back with any questions or concerns.

## 2018-08-12 LAB — SAMPLE TO BLOOD BANK

## 2018-08-21 ENCOUNTER — Ambulatory Visit: Payer: Medicare Other | Admitting: Nurse Practitioner

## 2018-08-26 ENCOUNTER — Encounter: Payer: Self-pay | Admitting: Nephrology

## 2018-08-28 ENCOUNTER — Inpatient Hospital Stay: Payer: Medicare Other

## 2018-08-28 ENCOUNTER — Telehealth: Payer: Self-pay

## 2018-08-28 ENCOUNTER — Inpatient Hospital Stay: Payer: Medicare Other | Attending: Oncology

## 2018-08-28 ENCOUNTER — Other Ambulatory Visit: Payer: Self-pay

## 2018-08-28 DIAGNOSIS — D649 Anemia, unspecified: Secondary | ICD-10-CM

## 2018-08-28 DIAGNOSIS — D6481 Anemia due to antineoplastic chemotherapy: Secondary | ICD-10-CM | POA: Insufficient documentation

## 2018-08-28 DIAGNOSIS — C61 Malignant neoplasm of prostate: Secondary | ICD-10-CM | POA: Diagnosis present

## 2018-08-28 DIAGNOSIS — D630 Anemia in neoplastic disease: Secondary | ICD-10-CM | POA: Insufficient documentation

## 2018-08-28 DIAGNOSIS — C7951 Secondary malignant neoplasm of bone: Secondary | ICD-10-CM | POA: Diagnosis not present

## 2018-08-28 LAB — CBC WITH DIFFERENTIAL (CANCER CENTER ONLY)
Abs Immature Granulocytes: 0 10*3/uL (ref 0.00–0.07)
Basophils Absolute: 0 10*3/uL (ref 0.0–0.1)
Basophils Relative: 0 %
Eosinophils Absolute: 0 10*3/uL (ref 0.0–0.5)
Eosinophils Relative: 1 %
HCT: 18.8 % — ABNORMAL LOW (ref 39.0–52.0)
Hemoglobin: 5.8 g/dL — CL (ref 13.0–17.0)
Immature Granulocytes: 0 %
Lymphocytes Relative: 17 %
Lymphs Abs: 0.7 10*3/uL (ref 0.7–4.0)
MCH: 28.4 pg (ref 26.0–34.0)
MCHC: 30.9 g/dL (ref 30.0–36.0)
MCV: 92.2 fL (ref 80.0–100.0)
Monocytes Absolute: 0.4 10*3/uL (ref 0.1–1.0)
Monocytes Relative: 11 %
Neutro Abs: 3 10*3/uL (ref 1.7–7.7)
Neutrophils Relative %: 71 %
Platelet Count: 251 10*3/uL (ref 150–400)
RBC: 2.04 MIL/uL — ABNORMAL LOW (ref 4.22–5.81)
RDW: 19.2 % — ABNORMAL HIGH (ref 11.5–15.5)
WBC Count: 4.2 10*3/uL (ref 4.0–10.5)
nRBC: 0 % (ref 0.0–0.2)

## 2018-08-28 LAB — SAMPLE TO BLOOD BANK

## 2018-08-28 LAB — CMP (CANCER CENTER ONLY)
ALT: 7 U/L (ref 0–44)
AST: 17 U/L (ref 15–41)
Albumin: 2.7 g/dL — ABNORMAL LOW (ref 3.5–5.0)
Alkaline Phosphatase: 85 U/L (ref 38–126)
Anion gap: 12 (ref 5–15)
BUN: 41 mg/dL — ABNORMAL HIGH (ref 8–23)
CO2: 28 mmol/L (ref 22–32)
Calcium: 7.6 mg/dL — ABNORMAL LOW (ref 8.9–10.3)
Chloride: 99 mmol/L (ref 98–111)
Creatinine: 3.65 mg/dL (ref 0.61–1.24)
GFR, Est AFR Am: 16 mL/min — ABNORMAL LOW (ref >60)
GFR, Estimated: 14 mL/min — ABNORMAL LOW (ref >60)
Glucose, Bld: 87 mg/dL (ref 70–99)
Potassium: 4 mmol/L (ref 3.5–5.1)
Sodium: 139 mmol/L (ref 135–145)
Total Bilirubin: 0.4 mg/dL (ref 0.3–1.2)
Total Protein: 6.1 g/dL — ABNORMAL LOW (ref 6.5–8.1)

## 2018-08-28 LAB — PREPARE RBC (CROSSMATCH)

## 2018-08-28 MED ORDER — SODIUM CHLORIDE 0.9% IV SOLUTION
250.0000 mL | Freq: Once | INTRAVENOUS | Status: AC
  Administered 2018-08-28: 250 mL via INTRAVENOUS
  Filled 2018-08-28: qty 250

## 2018-08-28 MED ORDER — ACETAMINOPHEN 325 MG PO TABS
650.0000 mg | ORAL_TABLET | Freq: Once | ORAL | Status: AC
  Administered 2018-08-28: 650 mg via ORAL

## 2018-08-28 MED ORDER — DIPHENHYDRAMINE HCL 25 MG PO CAPS
25.0000 mg | ORAL_CAPSULE | Freq: Once | ORAL | Status: AC
  Administered 2018-08-28: 25 mg via ORAL

## 2018-08-28 NOTE — Telephone Encounter (Signed)
Critical hemoglobin result of 5.8 received from lab. Dr. Alen Blew made aware and patient to receive 2 units of blood today. Infusion, RN made aware.

## 2018-08-28 NOTE — Patient Instructions (Signed)
Blood Transfusion, Adult, Care After This sheet gives you information about how to care for yourself after your procedure. Your doctor may also give you more specific instructions. If you have problems or questions, contact your doctor. Follow these instructions at home:   Take over-the-counter and prescription medicines only as told by your doctor.  Go back to your normal activities as told by your doctor.  Follow instructions from your doctor about how to take care of the area where an IV tube was put into your vein (insertion site). Make sure you: ? Wash your hands with soap and water before you change your bandage (dressing). If there is no soap and water, use hand sanitizer. ? Change your bandage as told by your doctor.  Check your IV insertion site every day for signs of infection. Check for: ? More redness, swelling, or pain. ? More fluid or blood. ? Warmth. ? Pus or a bad smell. Contact a doctor if:  You have more redness, swelling, or pain around the IV insertion site.  You have more fluid or blood coming from the IV insertion site.  Your IV insertion site feels warm to the touch.  You have pus or a bad smell coming from the IV insertion site.  Your pee (urine) turns pink, red, or brown.  You feel weak after doing your normal activities. Get help right away if:  You have signs of a serious allergic or body defense (immune) system reaction, including: ? Itchiness. ? Hives. ? Trouble breathing. ? Anxiety. ? Pain in your chest or lower back. ? Fever, flushing, and chills. ? Fast pulse. ? Rash. ? Watery poop (diarrhea). ? Throwing up (vomiting). ? Dark pee. ? Serious headache. ? Dizziness. ? Stiff neck. ? Yellow color in your face or the white parts of your eyes (jaundice). Summary  After a blood transfusion, return to your normal activities as told by your doctor.  Every day, check for signs of infection where the IV tube was put into your vein.  Some  signs of infection are warm skin, more redness and pain, more fluid or blood, and pus or a bad smell where the needle went in.  Contact your doctor if you feel weak or have any unusual symptoms. This information is not intended to replace advice given to you by your health care provider. Make sure you discuss any questions you have with your health care provider. Document Released: 01/16/2014 Document Revised: 05/02/2017 Document Reviewed: 08/20/2015 Elsevier Patient Education  2020 Elsevier Inc.  

## 2018-08-28 NOTE — Telephone Encounter (Signed)
Lab called to report creatinine of 3.65. Dr. Alen Blew made aware and no new orders received.

## 2018-08-29 LAB — TYPE AND SCREEN
ABO/RH(D): O POS
Antibody Screen: NEGATIVE
Unit division: 0
Unit division: 0

## 2018-08-29 LAB — BPAM RBC
Blood Product Expiration Date: 202009152359
Blood Product Expiration Date: 202009152359
ISSUE DATE / TIME: 202008191016
ISSUE DATE / TIME: 202008191016
Unit Type and Rh: 5100
Unit Type and Rh: 5100

## 2018-08-29 LAB — PROSTATE-SPECIFIC AG, SERUM (LABCORP): Prostate Specific Ag, Serum: <0.1 ng/mL (ref 0.0–4.0)

## 2018-09-05 ENCOUNTER — Other Ambulatory Visit: Payer: Self-pay | Admitting: Oncology

## 2018-09-05 ENCOUNTER — Telehealth: Payer: Self-pay | Admitting: Oncology

## 2018-09-05 DIAGNOSIS — C61 Malignant neoplasm of prostate: Secondary | ICD-10-CM

## 2018-09-05 NOTE — Telephone Encounter (Signed)
Called patient regarding infusion log, informed patient appointments time on 09/09 has been changed.

## 2018-09-09 MED FILL — XTANDI 40 MG CAPSULE: 40 | 30 days supply | Qty: 120 | Fill #0

## 2018-09-12 ENCOUNTER — Telehealth: Payer: Self-pay

## 2018-09-12 NOTE — Telephone Encounter (Signed)
-----   Message from Wyatt Portela, MD sent at 09/12/2018 10:47 AM EDT ----- 2 units ok for next week. Thanks.  ----- Message ----- From: Teodoro Spray, RN Sent: 09/12/2018  10:35 AM EDT To: Wyatt Portela, MD  Dialysis called stating hemoglobin was 6.3 on Tuesday. Pt has been symptomatic. Has appointment next Tuesday. I attempted to contact pt. No answer. LM to return call.  If for some reason he cannot come in tomorrow or Saturday, is it ok for him to wait until Tuesday to be transfused. And if he comes, how many units would you like ordered?

## 2018-09-12 NOTE — Telephone Encounter (Signed)
Patient's wife returned phone call and was made aware of lab and blood transfusion on Wednesday 09/18/18 at 7:30 am. Verbalized understanding and stated patient will be here.

## 2018-09-12 NOTE — Telephone Encounter (Signed)
Please see conversation below. Will wait for patient to call back.  If he does not return call by this afternoon, will call patient to remind him of appointment scheduled for 09/18/18.

## 2018-09-17 ENCOUNTER — Other Ambulatory Visit: Payer: Self-pay

## 2018-09-17 DIAGNOSIS — D649 Anemia, unspecified: Secondary | ICD-10-CM

## 2018-09-18 ENCOUNTER — Other Ambulatory Visit: Payer: Self-pay

## 2018-09-18 ENCOUNTER — Inpatient Hospital Stay: Payer: Medicare Other | Attending: Oncology

## 2018-09-18 ENCOUNTER — Inpatient Hospital Stay: Payer: Medicare Other

## 2018-09-18 ENCOUNTER — Other Ambulatory Visit: Payer: Medicare Other

## 2018-09-18 VITALS — BP 106/39 | HR 62 | Temp 98.0°F | Resp 16

## 2018-09-18 DIAGNOSIS — Z992 Dependence on renal dialysis: Secondary | ICD-10-CM | POA: Insufficient documentation

## 2018-09-18 DIAGNOSIS — D649 Anemia, unspecified: Secondary | ICD-10-CM

## 2018-09-18 DIAGNOSIS — D630 Anemia in neoplastic disease: Secondary | ICD-10-CM | POA: Diagnosis not present

## 2018-09-18 DIAGNOSIS — C7951 Secondary malignant neoplasm of bone: Secondary | ICD-10-CM | POA: Diagnosis not present

## 2018-09-18 DIAGNOSIS — C61 Malignant neoplasm of prostate: Secondary | ICD-10-CM | POA: Diagnosis present

## 2018-09-18 DIAGNOSIS — N19 Unspecified kidney failure: Secondary | ICD-10-CM | POA: Insufficient documentation

## 2018-09-18 DIAGNOSIS — Z23 Encounter for immunization: Secondary | ICD-10-CM | POA: Diagnosis not present

## 2018-09-18 LAB — CBC WITH DIFFERENTIAL (CANCER CENTER ONLY)
Abs Immature Granulocytes: 0.01 10*3/uL (ref 0.00–0.07)
Basophils Absolute: 0 10*3/uL (ref 0.0–0.1)
Basophils Relative: 1 %
Eosinophils Absolute: 0 10*3/uL (ref 0.0–0.5)
Eosinophils Relative: 1 %
HCT: 20.7 % — ABNORMAL LOW (ref 39.0–52.0)
Hemoglobin: 6.3 g/dL — CL (ref 13.0–17.0)
Immature Granulocytes: 0 %
Lymphocytes Relative: 12 %
Lymphs Abs: 0.5 10*3/uL — ABNORMAL LOW (ref 0.7–4.0)
MCH: 26.8 pg (ref 26.0–34.0)
MCHC: 30.4 g/dL (ref 30.0–36.0)
MCV: 88.1 fL (ref 80.0–100.0)
Monocytes Absolute: 0.4 10*3/uL (ref 0.1–1.0)
Monocytes Relative: 11 %
Neutro Abs: 2.9 10*3/uL (ref 1.7–7.7)
Neutrophils Relative %: 75 %
Platelet Count: 274 10*3/uL (ref 150–400)
RBC: 2.35 MIL/uL — ABNORMAL LOW (ref 4.22–5.81)
RDW: 16.6 % — ABNORMAL HIGH (ref 11.5–15.5)
WBC Count: 3.8 10*3/uL — ABNORMAL LOW (ref 4.0–10.5)
nRBC: 0 % (ref 0.0–0.2)

## 2018-09-18 LAB — PREPARE RBC (CROSSMATCH)

## 2018-09-18 LAB — CMP (CANCER CENTER ONLY)
ALT: 6 U/L (ref 0–44)
AST: 18 U/L (ref 15–41)
Albumin: 2.7 g/dL — ABNORMAL LOW (ref 3.5–5.0)
Alkaline Phosphatase: 93 U/L (ref 38–126)
Anion gap: 13 (ref 5–15)
BUN: 27 mg/dL — ABNORMAL HIGH (ref 8–23)
CO2: 29 mmol/L (ref 22–32)
Calcium: 7.8 mg/dL — ABNORMAL LOW (ref 8.9–10.3)
Chloride: 101 mmol/L (ref 98–111)
Creatinine: 4 mg/dL (ref 0.61–1.24)
GFR, Est AFR Am: 15 mL/min — ABNORMAL LOW (ref >60)
GFR, Estimated: 13 mL/min — ABNORMAL LOW (ref >60)
Glucose, Bld: 87 mg/dL (ref 70–99)
Potassium: 4.1 mmol/L (ref 3.5–5.1)
Sodium: 143 mmol/L (ref 135–145)
Total Bilirubin: 0.4 mg/dL (ref 0.3–1.2)
Total Protein: 6.2 g/dL — ABNORMAL LOW (ref 6.5–8.1)

## 2018-09-18 MED ORDER — ACETAMINOPHEN 325 MG PO TABS
ORAL_TABLET | ORAL | Status: AC
  Filled 2018-09-18: qty 2

## 2018-09-18 MED ORDER — INFLUENZA VAC A&B SA ADJ QUAD 0.5 ML IM PRSY
0.5000 mL | PREFILLED_SYRINGE | Freq: Once | INTRAMUSCULAR | Status: AC
  Administered 2018-09-18: 0.5 mL via INTRAMUSCULAR
  Filled 2018-09-18: qty 0.5

## 2018-09-18 MED ORDER — DIPHENHYDRAMINE HCL 25 MG PO CAPS
ORAL_CAPSULE | ORAL | Status: AC
  Filled 2018-09-18: qty 1

## 2018-09-18 MED ORDER — SODIUM CHLORIDE 0.9% IV SOLUTION
250.0000 mL | Freq: Once | INTRAVENOUS | Status: AC
  Administered 2018-09-18: 250 mL via INTRAVENOUS
  Filled 2018-09-18: qty 250

## 2018-09-18 MED ORDER — ACETAMINOPHEN 325 MG PO TABS
650.0000 mg | ORAL_TABLET | Freq: Once | ORAL | Status: AC
  Administered 2018-09-18: 09:00:00 650 mg via ORAL

## 2018-09-18 MED ORDER — DIPHENHYDRAMINE HCL 25 MG PO CAPS
25.0000 mg | ORAL_CAPSULE | Freq: Once | ORAL | Status: AC
  Administered 2018-09-18: 25 mg via ORAL

## 2018-09-18 NOTE — Patient Instructions (Signed)
Blood Transfusion, Adult, Care After This sheet gives you information about how to care for yourself after your procedure. Your doctor may also give you more specific instructions. If you have problems or questions, contact your doctor. Follow these instructions at home:   Take over-the-counter and prescription medicines only as told by your doctor.  Go back to your normal activities as told by your doctor.  Follow instructions from your doctor about how to take care of the area where an IV tube was put into your vein (insertion site). Make sure you: ? Wash your hands with soap and water before you change your bandage (dressing). If there is no soap and water, use hand sanitizer. ? Change your bandage as told by your doctor.  Check your IV insertion site every day for signs of infection. Check for: ? More redness, swelling, or pain. ? More fluid or blood. ? Warmth. ? Pus or a bad smell. Contact a doctor if:  You have more redness, swelling, or pain around the IV insertion site.  You have more fluid or blood coming from the IV insertion site.  Your IV insertion site feels warm to the touch.  You have pus or a bad smell coming from the IV insertion site.  Your pee (urine) turns pink, red, or brown.  You feel weak after doing your normal activities. Get help right away if:  You have signs of a serious allergic or body defense (immune) system reaction, including: ? Itchiness. ? Hives. ? Trouble breathing. ? Anxiety. ? Pain in your chest or lower back. ? Fever, flushing, and chills. ? Fast pulse. ? Rash. ? Watery poop (diarrhea). ? Throwing up (vomiting). ? Dark pee. ? Serious headache. ? Dizziness. ? Stiff neck. ? Yellow color in your face or the white parts of your eyes (jaundice). Summary  After a blood transfusion, return to your normal activities as told by your doctor.  Every day, check for signs of infection where the IV tube was put into your vein.  Some  signs of infection are warm skin, more redness and pain, more fluid or blood, and pus or a bad smell where the needle went in.  Contact your doctor if you feel weak or have any unusual symptoms. This information is not intended to replace advice given to you by your health care provider. Make sure you discuss any questions you have with your health care provider. Document Released: 01/16/2014 Document Revised: 05/02/2017 Document Reviewed: 08/20/2015 Elsevier Patient Education  [place].  Influenza (Flu) Vaccine (Inactivated or Recombinant): What You Need to Know 1. Why get vaccinated? Influenza vaccine can prevent influenza (flu). Flu is a contagious disease that spreads around the [place] every year, usually between October and May. Anyone can get the flu, but it is more dangerous for some people. Infants and young children, people 66 years of age and older, pregnant women, and people with certain health conditions or a weakened immune system are at greatest risk of flu complications. Pneumonia, bronchitis, sinus infections and ear infections are examples of flu-related complications. If you have a medical condition, such as heart disease, cancer or diabetes, flu can make it worse. Flu can cause fever and chills, sore throat, muscle aches, fatigue, cough, headache, and runny or stuffy nose. Some people may have vomiting and diarrhea, though this is more common in children than adults. Each year thousands of people in the [place] die from flu, and many more are hospitalized. Flu vaccine prevents  millions of illnesses and flu-related visits to the doctor each year. 2. Influenza vaccine CDC recommends everyone 26 months of age and older get vaccinated every flu season. Children 6 months through 70 years of age may need 2 doses during a single flu season. Everyone else needs only 1 dose each flu season. It takes about 2 weeks for protection to develop after vaccination. There are  many flu viruses, and they are always changing. Each year a new flu vaccine is made to protect against three or four viruses that are likely to cause disease in the upcoming flu season. Even when the vaccine doesn't exactly match these viruses, it may still provide some protection. Influenza vaccine does not cause flu. Influenza vaccine may be given at the same time as other vaccines. 3. Talk with your health care provider Tell your vaccine provider if the person getting the vaccine:  Has had an allergic reaction after a previous dose of influenza vaccine, or has any severe, life-threatening allergies.  Has ever had Guillain-Barr Syndrome (also called GBS). In some cases, your health care provider may decide to postpone influenza vaccination to a future visit. People with minor illnesses, such as a cold, may be vaccinated. People who are moderately or severely ill should usually wait until they recover before getting influenza vaccine. Your health care provider can give you more information. 4. Risks of a vaccine reaction  Soreness, redness, and swelling where shot is given, fever, muscle aches, and headache can happen after influenza vaccine.  There may be a very small increased risk of Guillain-Barr Syndrome (GBS) after inactivated influenza vaccine (the flu shot). Young children who get the flu shot along with pneumococcal vaccine (PCV13), and/or DTaP vaccine at the same time might be slightly more likely to have a seizure caused by fever. Tell your health care provider if a child who is getting flu vaccine has ever had a seizure. People sometimes faint after medical procedures, including vaccination. Tell your provider if you feel dizzy or have vision changes or ringing in the ears. As with any medicine, there is a very remote chance of a vaccine causing a severe allergic reaction, other serious injury, or death. 5. What if there is a serious problem? An allergic reaction could occur after  the vaccinated person leaves the clinic. If you see signs of a severe allergic reaction (hives, swelling of the face and throat, difficulty breathing, a fast heartbeat, dizziness, or weakness), call 9-1-1 and get the person to the nearest hospital. For other signs that concern you, call your health care provider. Adverse reactions should be reported to the Vaccine Adverse Event Reporting System (VAERS). Your health care provider will usually file this report, or you can do it yourself. Visit the VAERS website at www.vaers.SamedayNews.es or call (973) 133-5869.VAERS is only for reporting reactions, and VAERS staff do not give medical advice. 6. The National Vaccine Injury Compensation Program The [employer] Vaccine Injury Compensation Program (VICP) is a federal program that was created to compensate people who may have been injured by certain vaccines. Visit the VICP website at GoldCloset.com.ee or call 937-876-0307 to learn about the program and about filing a claim. There is a time limit to file a claim for compensation. 7. How can I learn more?  Ask your healthcare provider.  Call your local or state health department.  Contact the Centers for Disease Control and Prevention (CDC): ? Call 918 141 4191 (1-800-CDC-INFO) or ? Visit CDC's https://gibson.com/ Vaccine Information Statement (Interim) Inactivated Influenza Vaccine (08/23/2017) This  information is not intended to replace advice given to you by your health care provider. Make sure you discuss any questions you have with your health care provider. Document Released: 10/20/2005 Document Revised: 04/16/2018 Document Reviewed: 08/27/2017 Elsevier Patient Education  2020 [place] (COVID-19) Are you at risk?  Are you at risk for the Coronavirus (COVID-19)?  To be considered HIGH RISK for Coronavirus (COVID-19), you have to meet the following criteria:  . Traveled to [place], [place], [place], [place] or [place]; or in  the [place] to [place], [place], [place], or [place]; and have fever, cough, and shortness of breath within the last 2 weeks of travel OR . Been in close contact with a person diagnosed with COVID-19 within the last 2 weeks and have fever, cough, and shortness of breath . IF YOU DO NOT MEET THESE CRITERIA, YOU ARE CONSIDERED LOW RISK FOR COVID-19.  What to do if you are HIGH RISK for COVID-19?  Marland Kitchen If you are having a medical emergency, call 911. . Seek medical care right away. Before you go to a doctor's office, urgent care or emergency department, call ahead and tell them about your recent travel, contact with someone diagnosed with COVID-19, and your symptoms. You should receive instructions from your physician's office regarding next steps of care.  . When you arrive at healthcare provider, tell the healthcare staff immediately you have returned from visiting [place], [place], [place], [place] or [place]; or traveled in the [place] to [place], [place], [place], or [place]; in the last two weeks or you have been in close contact with a person diagnosed with COVID-19 in the last 2 weeks.   . Tell the health care staff about your symptoms: fever, cough and shortness of breath. . After you have been seen by a medical provider, you will be either: o Tested for (COVID-19) and discharged home on quarantine except to seek medical care if symptoms worsen, and asked to  - Stay home and avoid contact with others until you get your results (4-5 days)  - Avoid travel on public transportation if possible (such as bus, train, or airplane) or o Sent to the Emergency Department by EMS for evaluation, COVID-19 testing, and possible admission depending on your condition and test results.  What to do if you are LOW RISK for COVID-19?  Reduce your risk of any infection by using the same precautions used for avoiding the common cold or flu:  Marland Kitchen Wash your hands often with soap and warm  water for at least 20 seconds.  If soap and water are not readily available, use an alcohol-based hand sanitizer with at least 60% alcohol.  . If coughing or sneezing, cover your mouth and nose by coughing or sneezing into the elbow areas of your shirt or coat, into a tissue or into your sleeve (not your hands). . Avoid shaking hands with others and consider head nods or verbal greetings only. . Avoid touching your eyes, nose, or mouth with unwashed hands.  . Avoid close contact with people who are sick. . Avoid places or events with large numbers of people in one location, like concerts or sporting events. . Carefully consider travel plans you have or are making. . If you are planning any travel outside or inside the [place], visit the CDC's Travelers' Health webpage for the latest health notices. . If you have some symptoms but not all symptoms, continue to monitor at home and seek  medical attention if your symptoms worsen. . If you are having a medical emergency, call 911.   [place] / e-Visit: [email or web address]         [facility]: [facility]: [phone]                   [facility]: [phone]

## 2018-09-19 LAB — TYPE AND SCREEN
ABO/RH(D): O POS
Antibody Screen: NEGATIVE
Unit division: 0
Unit division: 0

## 2018-09-19 LAB — PROSTATE-SPECIFIC AG, SERUM (LABCORP): Prostate Specific Ag, Serum: <0.1 ng/mL (ref 0.0–4.0)

## 2018-09-19 LAB — BPAM RBC
Blood Product Expiration Date: 202010092359
Blood Product Expiration Date: 202010092359
ISSUE DATE / TIME: 202009090841
ISSUE DATE / TIME: 202009090841
Unit Type and Rh: 5100
Unit Type and Rh: 5100

## 2018-10-02 ENCOUNTER — Other Ambulatory Visit: Payer: Self-pay | Admitting: Oncology

## 2018-10-02 DIAGNOSIS — C61 Malignant neoplasm of prostate: Secondary | ICD-10-CM

## 2018-10-04 ENCOUNTER — Ambulatory Visit: Payer: Medicare Other | Admitting: Cardiology

## 2018-10-08 ENCOUNTER — Other Ambulatory Visit: Payer: Self-pay

## 2018-10-08 DIAGNOSIS — D649 Anemia, unspecified: Secondary | ICD-10-CM

## 2018-10-08 DIAGNOSIS — C61 Malignant neoplasm of prostate: Secondary | ICD-10-CM

## 2018-10-09 ENCOUNTER — Inpatient Hospital Stay: Payer: Medicare Other

## 2018-10-09 ENCOUNTER — Telehealth: Payer: Self-pay

## 2018-10-09 ENCOUNTER — Telehealth: Payer: Self-pay | Admitting: *Deleted

## 2018-10-09 ENCOUNTER — Inpatient Hospital Stay: Payer: Medicare Other | Admitting: Oncology

## 2018-10-09 ENCOUNTER — Other Ambulatory Visit: Payer: Self-pay

## 2018-10-09 VITALS — BP 111/40 | HR 80 | Temp 97.8°F | Resp 18 | Ht 72.0 in | Wt 164.0 lb

## 2018-10-09 DIAGNOSIS — C61 Malignant neoplasm of prostate: Secondary | ICD-10-CM

## 2018-10-09 DIAGNOSIS — D649 Anemia, unspecified: Secondary | ICD-10-CM

## 2018-10-09 LAB — CBC WITH DIFFERENTIAL (CANCER CENTER ONLY)
Abs Immature Granulocytes: 0.02 10*3/uL (ref 0.00–0.07)
Basophils Absolute: 0 10*3/uL (ref 0.0–0.1)
Basophils Relative: 1 %
Eosinophils Absolute: 0 10*3/uL (ref 0.0–0.5)
Eosinophils Relative: 1 %
HCT: 24.6 % — ABNORMAL LOW (ref 39.0–52.0)
Hemoglobin: 7.6 g/dL — ABNORMAL LOW (ref 13.0–17.0)
Immature Granulocytes: 1 %
Lymphocytes Relative: 17 %
Lymphs Abs: 0.7 10*3/uL (ref 0.7–4.0)
MCH: 30.5 pg (ref 26.0–34.0)
MCHC: 30.9 g/dL (ref 30.0–36.0)
MCV: 98.8 fL (ref 80.0–100.0)
Monocytes Absolute: 0.5 10*3/uL (ref 0.1–1.0)
Monocytes Relative: 12 %
Neutro Abs: 2.9 10*3/uL (ref 1.7–7.7)
Neutrophils Relative %: 68 %
Platelet Count: 282 10*3/uL (ref 150–400)
RBC: 2.49 MIL/uL — ABNORMAL LOW (ref 4.22–5.81)
RDW: 20.8 % — ABNORMAL HIGH (ref 11.5–15.5)
WBC Count: 4.2 10*3/uL (ref 4.0–10.5)
nRBC: 0 % (ref 0.0–0.2)

## 2018-10-09 LAB — CMP (CANCER CENTER ONLY)
ALT: 7 U/L (ref 0–44)
AST: 18 U/L (ref 15–41)
Albumin: 2.8 g/dL — ABNORMAL LOW (ref 3.5–5.0)
Alkaline Phosphatase: 100 U/L (ref 38–126)
Anion gap: 14 (ref 5–15)
BUN: 38 mg/dL — ABNORMAL HIGH (ref 8–23)
CO2: 26 mmol/L (ref 22–32)
Calcium: 8 mg/dL — ABNORMAL LOW (ref 8.9–10.3)
Chloride: 101 mmol/L (ref 98–111)
Creatinine: 4.38 mg/dL (ref 0.61–1.24)
GFR, Est AFR Am: 13 mL/min — ABNORMAL LOW (ref >60)
GFR, Estimated: 11 mL/min — ABNORMAL LOW (ref >60)
Glucose, Bld: 114 mg/dL — ABNORMAL HIGH (ref 70–99)
Potassium: 4.2 mmol/L (ref 3.5–5.1)
Sodium: 141 mmol/L (ref 135–145)
Total Bilirubin: 0.4 mg/dL (ref 0.3–1.2)
Total Protein: 6.3 g/dL — ABNORMAL LOW (ref 6.5–8.1)

## 2018-10-09 LAB — SAMPLE TO BLOOD BANK

## 2018-10-09 LAB — PREPARE RBC (CROSSMATCH)

## 2018-10-09 MED ORDER — DIPHENHYDRAMINE HCL 25 MG PO CAPS
ORAL_CAPSULE | ORAL | Status: AC
  Filled 2018-10-09: qty 1

## 2018-10-09 MED ORDER — ACETAMINOPHEN 325 MG PO TABS
650.0000 mg | ORAL_TABLET | Freq: Once | ORAL | Status: AC
  Administered 2018-10-09: 650 mg via ORAL

## 2018-10-09 MED ORDER — SODIUM CHLORIDE 0.9% IV SOLUTION
250.0000 mL | Freq: Once | INTRAVENOUS | Status: AC
  Administered 2018-10-09: 09:00:00 250 mL via INTRAVENOUS
  Filled 2018-10-09: qty 250

## 2018-10-09 MED ORDER — DIPHENHYDRAMINE HCL 25 MG PO CAPS
25.0000 mg | ORAL_CAPSULE | Freq: Once | ORAL | Status: AC
  Administered 2018-10-09: 25 mg via ORAL

## 2018-10-09 MED ORDER — ACETAMINOPHEN 325 MG PO TABS
ORAL_TABLET | ORAL | Status: AC
  Filled 2018-10-09: qty 2

## 2018-10-09 MED FILL — XTANDI 40 MG CAPSULE: 40 | 30 days supply | Qty: 120 | Fill #0

## 2018-10-09 NOTE — Progress Notes (Signed)
Hematology and Oncology Follow Up Visit  [name] [name] [id number] 1930/04/08 85 y.o. 10/09/2018 8:11 AM [name], MDElkins, [name], *   Principle Diagnosis: 85 year old man with:  1.  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  Initially presented with Gleason score 4+4 = 8 PSA of 369.   2.  Anemia: Multifactorial with likely bone marrow involvement with his prostate cancer, possible MDS, renal insufficiency as well as chronic disease.  Prior Therapy: Androgen deprivation therapy and he remains on it under the care of Dr. Diona Fanti. His PSA decreased to 31 in 2017. His PSA in November 2017 was 41 and in March 2018 was 29.    Current therapy:   Xtandi 160 mg daily started in March 2019.  Packed red cell transfusion as needed.  Interim History: [name] [name] presents today for a follow-up.  Since the last visit, he reports no major changes in his health.  He is feeling slightly more fatigued and tired and slight dyspnea on exertion.  He denies any palpitation or chest pain at rest.  He denies any recent hospitalizations or illnesses.  He did have excessive blood loss from his last fistula access during dialysis.  He continues to tolerate Xtandi without any complications at this time.  He remains ambulatory and attends to activities of daily living.  Denies any worsening pain or discomfort.   He denied headaches, blurry vision, syncope or seizures.  Denies any fevers, chills or sweats.  Denied chest pain, palpitation, orthopnea or leg edema.  Denied cough, wheezing or hemoptysis.  Denied nausea, vomiting or abdominal pain.  Denies any constipation or diarrhea.  Denies any frequency urgency or hesitancy.  Denies any arthralgias or myalgias.  Denies any skin rashes or lesions.  Denies any bleeding or clotting tendency.  Denies any easy bruising.  Denies any hair or nail changes.  Denies any anxiety or depression.  Remaining review of  system is negative.           .    Medications: Without any changes on review. Current Outpatient Medications  Medication Sig Dispense Refill  . amLODipine (NORVASC) 5 MG tablet TAKE 1 TABLET BY MOUTH DAILY (Patient taking differently: Take 5 mg by mouth every evening. ) 90 tablet 1  . aspirin EC 81 MG tablet Take 1 tablet (81 mg total) by mouth daily. (Patient taking differently: Take 81 mg by mouth every evening. ) 90 tablet 3  . atorvastatin (LIPITOR) 20 MG tablet TAKE 1 TABLET BY MOUTH DAILY (Patient taking differently: Take 20 mg by mouth every evening. ) 90 tablet 1  . B Complex-C-Folic Acid (DIALYVITE 366) 0.8 MG TABS Take 0.8 mg by mouth every evening.   3  . cinacalcet (SENSIPAR) 60 MG tablet Take 60 mg by mouth every evening.     . latanoprost (XALATAN) 0.005 % ophthalmic solution Place 1 drop into both eyes at bedtime.    Marland Kitchen leuprolide (LUPRON) 30 MG injection Inject 30 mg into the muscle every 4 (four) months.    Marland Kitchen oxyCODONE-acetaminophen (PERCOCET) 5-325 MG tablet Take 1 tablet by mouth every 6 (six) hours as needed for severe pain. (Patient not taking: Reported on 07/20/2018) 10 tablet 0  . pantoprazole (PROTONIX) 40 MG tablet Take 1 tablet (40 mg total) by mouth daily. (Patient taking differently: Take 40 mg by mouth every evening. ) 30 tablet 0  . XTANDI 40 MG capsule TAKE 4 CAPSULES (160 MG TOTAL) BY MOUTH DAILY. 120 capsule 0  No current facility-administered medications for this visit.      Allergies: No Known Allergies  Past Medical History, Surgical history, Social history, and Family History reviewed and updated.Marland Kitchen  Physical Exam:  Blood pressure (!) 111/40, pulse 80, temperature 97.8 F (36.6 C), temperature source Oral, resp. rate 18, height 6' (1.829 m), weight 164 lb (74.4 kg), SpO2 100 %.   ECOG: 1     General appearance: Alert, awake without any distress. Head: Atraumatic without abnormalities Oropharynx: Without any thrush or  ulcers. Eyes: No scleral icterus. Lymph nodes: No lymphadenopathy noted in the cervical, supraclavicular, or axillary nodes Heart:regular rate and rhythm, without any murmurs or gallops.   Lung: Clear to auscultation without any rhonchi, wheezes or dullness to percussion. Abdomin: Soft, nontender without any shifting dullness or ascites. Musculoskeletal: No clubbing or cyanosis. Neurological: No motor or sensory deficits. Skin: No rashes or lesions. Psychiatric: Mood and affect appeared normal.            Lab Results: Lab Results  Component Value Date   WBC 4.2 10/09/2018   HGB 7.6 (L) 10/09/2018   HCT 24.6 (L) 10/09/2018   MCV 98.8 10/09/2018   PLT 282 10/09/2018     Chemistry      Component Value Date/Time   NA 143 09/18/2018 0731   NA 143 09/29/2016 0810   K 4.1 09/18/2018 0731   K 4.3 09/29/2016 0810   CL 101 09/18/2018 0731   CO2 29 09/18/2018 0731   CO2 31 (H) 09/29/2016 0810   BUN 27 (H) 09/18/2018 0731   BUN 27.9 (H) 09/29/2016 0810   CREATININE 4.00 (HH) 09/18/2018 0731   CREATININE 7.4 (HH) 09/29/2016 0810      Component Value Date/Time   CALCIUM 7.8 (L) 09/18/2018 0731   CALCIUM 9.9 09/29/2016 0810   ALKPHOS 93 09/18/2018 0731   ALKPHOS 141 09/29/2016 0810   AST 18 09/18/2018 0731   AST 21 09/29/2016 0810   ALT 6 09/18/2018 0731   ALT 10 09/29/2016 0810   BILITOT 0.4 09/18/2018 0731   BILITOT 0.48 09/29/2016 0810        Results for FERDIE, BAKKEN (MRN [id number]) as of 10/09/2018 08:02  Ref. Range 08/28/2018 08:40 09/18/2018 07:31  Prostate Specific Ag, Serum Latest Ref Range: 0.0 - 4.0 ng/mL <0.1 <0.1            Impression and Plan:   85 year old man with:  1.  Castration-resistant prostate cancer with pelvic adenopathy diagnosed in 2017.    He is currently on Xtandi with excellent PSA response that is currently undetectable.  He has tolerated this therapy reasonably well without any recent complaints.  Risks and benefits of  continuing this therapy was reviewed.  Potential complications which anemia could be 1 of them were reiterated.  Alternative options including Zytiga and systemic chemotherapy were also discussed.  At this time we opted to continue with the same dose and schedule.  2. Androgen deprivation therapy: He is currently receiving it under the care of Dr. Diona Fanti without any additional treatment.   3.  Anemia: Related to renal insufficiency, chronic disease, malignancy and possibly myelodysplasia.  At this time he is receiving supportive transfusion as needed.  His hemoglobin is 7.6 today and he is having mildly symptomatic from his recent blood loss.  We will proceed with 1 unit of packed red cells.  4.  Prognosis and goals of care: His disease is incurable although aggressive measures are warranted given his reasonable performance status.  5.  Renal failure: He remains on dialysis at this time without any additional issues or complaints.  6. Follow-up: He will continue to return every 3 weeks for laboratory check and possible transfusion if needed.  25  minutes was spent with the patient face-to-face today.  More than 50% of time was spent on reviewing his disease status, treatment options and addressing complications related to his care.  Zola Button, MD 9/30/20208:11 AM

## 2018-10-09 NOTE — Patient Instructions (Signed)
Blood Transfusion, Adult, Care After This sheet gives you information about how to care for yourself after your procedure. Your doctor may also give you more specific instructions. If you have problems or questions, contact your doctor. Follow these instructions at home:   Take over-the-counter and prescription medicines only as told by your doctor.  Go back to your normal activities as told by your doctor.  Follow instructions from your doctor about how to take care of the area where an IV tube was put into your vein (insertion site). Make sure you: ? Wash your hands with soap and water before you change your bandage (dressing). If there is no soap and water, use hand sanitizer. ? Change your bandage as told by your doctor.  Check your IV insertion site every day for signs of infection. Check for: ? More redness, swelling, or pain. ? More fluid or blood. ? Warmth. ? Pus or a bad smell. Contact a doctor if:  You have more redness, swelling, or pain around the IV insertion site.  You have more fluid or blood coming from the IV insertion site.  Your IV insertion site feels warm to the touch.  You have pus or a bad smell coming from the IV insertion site.  Your pee (urine) turns pink, red, or brown.  You feel weak after doing your normal activities. Get help right away if:  You have signs of a serious allergic or body defense (immune) system reaction, including: ? Itchiness. ? Hives. ? Trouble breathing. ? Anxiety. ? Pain in your chest or lower back. ? Fever, flushing, and chills. ? Fast pulse. ? Rash. ? Watery poop (diarrhea). ? Throwing up (vomiting). ? Dark pee. ? Serious headache. ? Dizziness. ? Stiff neck. ? Yellow color in your face or the white parts of your eyes (jaundice). Summary  After a blood transfusion, return to your normal activities as told by your doctor.  Every day, check for signs of infection where the IV tube was put into your vein.  Some  signs of infection are warm skin, more redness and pain, more fluid or blood, and pus or a bad smell where the needle went in.  Contact your doctor if you feel weak or have any unusual symptoms. This information is not intended to replace advice given to you by your health care provider. Make sure you discuss any questions you have with your health care provider. Document Released: 01/16/2014 Document Revised: 05/02/2017 Document Reviewed: 08/20/2015 Elsevier Patient Education  [place].  Influenza (Flu) Vaccine (Inactivated or Recombinant): What You Need to Know 1. Why get vaccinated? Influenza vaccine can prevent influenza (flu). Flu is a contagious disease that spreads around the [place] every year, usually between October and May. Anyone can get the flu, but it is more dangerous for some people. Infants and young children, people 83 years of age and older, pregnant women, and people with certain health conditions or a weakened immune system are at greatest risk of flu complications. Pneumonia, bronchitis, sinus infections and ear infections are examples of flu-related complications. If you have a medical condition, such as heart disease, cancer or diabetes, flu can make it worse. Flu can cause fever and chills, sore throat, muscle aches, fatigue, cough, headache, and runny or stuffy nose. Some people may have vomiting and diarrhea, though this is more common in children than adults. Each year thousands of people in the [place] die from flu, and many more are hospitalized. Flu vaccine prevents  millions of illnesses and flu-related visits to the doctor each year. 2. Influenza vaccine CDC recommends everyone 83 months of age and older get vaccinated every flu season. Children 6 months through 18 years of age may need 2 doses during a single flu season. Everyone else needs only 1 dose each flu season. It takes about 2 weeks for protection to develop after vaccination. There are  many flu viruses, and they are always changing. Each year a new flu vaccine is made to protect against three or four viruses that are likely to cause disease in the upcoming flu season. Even when the vaccine doesn't exactly match these viruses, it may still provide some protection. Influenza vaccine does not cause flu. Influenza vaccine may be given at the same time as other vaccines. 3. Talk with your health care provider Tell your vaccine provider if the person getting the vaccine:  Has had an allergic reaction after a previous dose of influenza vaccine, or has any severe, life-threatening allergies.  Has ever had Guillain-Barr Syndrome (also called GBS). In some cases, your health care provider may decide to postpone influenza vaccination to a future visit. People with minor illnesses, such as a cold, may be vaccinated. People who are moderately or severely ill should usually wait until they recover before getting influenza vaccine. Your health care provider can give you more information. 4. Risks of a vaccine reaction  Soreness, redness, and swelling where shot is given, fever, muscle aches, and headache can happen after influenza vaccine.  There may be a very small increased risk of Guillain-Barr Syndrome (GBS) after inactivated influenza vaccine (the flu shot). Young children who get the flu shot along with pneumococcal vaccine (PCV13), and/or DTaP vaccine at the same time might be slightly more likely to have a seizure caused by fever. Tell your health care provider if a child who is getting flu vaccine has ever had a seizure. People sometimes faint after medical procedures, including vaccination. Tell your provider if you feel dizzy or have vision changes or ringing in the ears. As with any medicine, there is a very remote chance of a vaccine causing a severe allergic reaction, other serious injury, or death. 5. What if there is a serious problem? An allergic reaction could occur after  the vaccinated person leaves the clinic. If you see signs of a severe allergic reaction (hives, swelling of the face and throat, difficulty breathing, a fast heartbeat, dizziness, or weakness), call 9-1-1 and get the person to the nearest hospital. For other signs that concern you, call your health care provider. Adverse reactions should be reported to the Vaccine Adverse Event Reporting System (VAERS). Your health care provider will usually file this report, or you can do it yourself. Visit the VAERS website at www.vaers.SamedayNews.es or call 907-386-7092.VAERS is only for reporting reactions, and VAERS staff do not give medical advice. 6. The National Vaccine Injury Compensation Program The [employer] Vaccine Injury Compensation Program (VICP) is a federal program that was created to compensate people who may have been injured by certain vaccines. Visit the VICP website at GoldCloset.com.ee or call 769-888-0870 to learn about the program and about filing a claim. There is a time limit to file a claim for compensation. 7. How can I learn more?  Ask your healthcare provider.  Call your local or state health department.  Contact the Centers for Disease Control and Prevention (CDC): ? Call 7072483634 (1-800-CDC-INFO) or ? Visit CDC's https://gibson.com/ Vaccine Information Statement (Interim) Inactivated Influenza Vaccine (08/23/2017) This  information is not intended to replace advice given to you by your health care provider. Make sure you discuss any questions you have with your health care provider. Document Released: 10/20/2005 Document Revised: 04/16/2018 Document Reviewed: 08/27/2017 Elsevier Patient Education  2020 [place] (COVID-19) Are you at risk?  Are you at risk for the Coronavirus (COVID-19)?  To be considered HIGH RISK for Coronavirus (COVID-19), you have to meet the following criteria:  . Traveled to [place], [place], [place], [place] or [place]; or in  the [place] to [place], [place], [place], or [place]; and have fever, cough, and shortness of breath within the last 2 weeks of travel OR . Been in close contact with a person diagnosed with COVID-19 within the last 2 weeks and have fever, cough, and shortness of breath . IF YOU DO NOT MEET THESE CRITERIA, YOU ARE CONSIDERED LOW RISK FOR COVID-19.  What to do if you are HIGH RISK for COVID-19?  Marland Kitchen If you are having a medical emergency, call 911. . Seek medical care right away. Before you go to a doctor's office, urgent care or emergency department, call ahead and tell them about your recent travel, contact with someone diagnosed with COVID-19, and your symptoms. You should receive instructions from your physician's office regarding next steps of care.  . When you arrive at healthcare provider, tell the healthcare staff immediately you have returned from visiting [place], [place], [place], [place] or [place]; or traveled in the [place] to [place], [place], [place], or [place]; in the last two weeks or you have been in close contact with a person diagnosed with COVID-19 in the last 2 weeks.   . Tell the health care staff about your symptoms: fever, cough and shortness of breath. . After you have been seen by a medical provider, you will be either: o Tested for (COVID-19) and discharged home on quarantine except to seek medical care if symptoms worsen, and asked to  - Stay home and avoid contact with others until you get your results (4-5 days)  - Avoid travel on public transportation if possible (such as bus, train, or airplane) or o Sent to the Emergency Department by EMS for evaluation, COVID-19 testing, and possible admission depending on your condition and test results.  What to do if you are LOW RISK for COVID-19?  Reduce your risk of any infection by using the same precautions used for avoiding the common cold or flu:  Marland Kitchen Wash your hands often with soap and warm  water for at least 20 seconds.  If soap and water are not readily available, use an alcohol-based hand sanitizer with at least 60% alcohol.  . If coughing or sneezing, cover your mouth and nose by coughing or sneezing into the elbow areas of your shirt or coat, into a tissue or into your sleeve (not your hands). . Avoid shaking hands with others and consider head nods or verbal greetings only. . Avoid touching your eyes, nose, or mouth with unwashed hands.  . Avoid close contact with people who are sick. . Avoid places or events with large numbers of people in one location, like concerts or sporting events. . Carefully consider travel plans you have or are making. . If you are planning any travel outside or inside the [place], visit the CDC's Travelers' Health webpage for the latest health notices. . If you have some symptoms but not all symptoms, continue to monitor at home and seek  medical attention if your symptoms worsen. . If you are having a medical emergency, call 911.   [place] / e-Visit: [email or web address]         [facility]: [facility]: [phone]                   [facility]: [phone]

## 2018-10-09 NOTE — Telephone Encounter (Signed)
CRITICAL VALUE STICKER  CRITICAL VALUE: Creat = 4.38.  RECEIVER (on-site recipient of call): Miangel Flom Winston-Spruiell RN, Triage [place].   DATE & TIME NOTIFIED: 10/09/2018 at 0854.   MESSENGER (representative from lab): Corbin Ade [facility].  MD NOTIFIED: Collaborative for Dr. Alen Blew.  TIME OF NOTIFICATION: 10/09/2018 at 0908.  RESPONSE: None. Provider F/U currently in progress.

## 2018-10-09 NOTE — Telephone Encounter (Signed)
Dr. Alen Blew made aware of critical creatinine call received from the lab. Creatinine 4.38. No new orders received.

## 2018-10-10 ENCOUNTER — Telehealth: Payer: Self-pay

## 2018-10-10 LAB — BPAM RBC
Blood Product Expiration Date: 202011012359
ISSUE DATE / TIME: 202009300919
Unit Type and Rh: 5100

## 2018-10-10 LAB — TYPE AND SCREEN
ABO/RH(D): O POS
Antibody Screen: NEGATIVE
Unit division: 0

## 2018-10-10 LAB — PROSTATE-SPECIFIC AG, SERUM (LABCORP): Prostate Specific Ag, Serum: <0.1 ng/mL (ref 0.0–4.0)

## 2018-10-10 NOTE — Telephone Encounter (Signed)
-----   Message from Wyatt Portela, MD sent at 10/10/2018  8:20 AM EDT ----- Please let him know his PSA is still low

## 2018-10-10 NOTE — Telephone Encounter (Signed)
Patient made aware of PSA results and verbalized understanding.

## 2018-10-11 ENCOUNTER — Telehealth: Payer: Self-pay | Admitting: Oncology

## 2018-10-11 NOTE — Telephone Encounter (Signed)
Called and left msg. Printout will be mailed

## 2018-10-14 ENCOUNTER — Telehealth: Payer: Self-pay

## 2018-10-14 ENCOUNTER — Telehealth: Payer: Self-pay | Admitting: Oncology

## 2018-10-14 NOTE — Telephone Encounter (Signed)
Called pt per 10/5 sch message - r/s to another date that works for the pt . They are aware of new appt date and time

## 2018-10-14 NOTE — Progress Notes (Addendum)
Cardiology Office Note   Date:  10/16/2018   ID:  [name] [name], DOB 18-Dec-1930, MRN [id number]  PCP:  [name], MD  Cardiologist:   Minus Breeding, MD   No chief complaint on file.     History of Present Illness: [name] [name] is a 85 y.o. male who presents for follow up of CAD s/p CABG (LIMA to LAD, SVG to intermediate, reverse SVG to OM, reverse SVG to posterior descending artery) 12/31/2012.  Prior to parathyroidectomy recently he had a [employer].  There is a small size, irreversible defect in the basal and mid inferior walls consistent with prior infarction, no ischemia is seen. LVEF is mildly decreased at 50%.   This was consistent with previous myocardial infarction.    Since I last saw him he has been emergency room a couple of times to get blood transfusions.  His hemoglobin goes down to 5.8.  I reviewed these records.  I reviewed oncology.  This was felt to be related to his renal disease and may be some myelodysplasia.  He is going to get periodic transfusions as needed.  He says he does not get particularly symptomatic prior to this which is surprising.  In particular he denies any chest pressure, neck or arm discomfort.  He does not have any palpitations, presyncope or syncope.  He said no weight gain or edema.  Denies any shortness of breath.  He mows his lawn including a little trimming with a push mower on the embankment.   Past Medical History:  Diagnosis Date  . Anemia of chronic disease   . Coronary artery disease   . Dialysis patient ([place])    T, TH, Sat  . Dyslipidemia   . ESRD (end stage renal disease) on dialysis [facility])    "Industrial; T, Thurs, Sat" (12/26/2012)  . Gout   . History of blood transfusion   . Hypertension   . Metastatic cancer to bone ([place]) dx'd 04/04/17  . MYOCARDIAL INFARCTION, ACUTE, NON-Q WAVE 04/26/2009   Qualifier: Diagnosis of  By: Percival Spanish, MD, Farrel Gordon    . Obesity   . Prostate CA ([place]) dx'd 12/2014   initial dx   . Type II diabetes mellitus ([place])    "diet controlled" (12/26/2012)    Past Surgical History:  Procedure Laterality Date  . ARTERIOVENOUS GRAFT PLACEMENT Right 12/2008   "upper arm"  . AV FISTULA PLACEMENT    . BIOPSY  07/09/2017   Procedure: BIOPSY;  Surgeon: Wonda Horner, MD;  Location: [facility] ENDOSCOPY;  Service: Endoscopy;;  . CARDIAC CATHETERIZATION  12/30/2012  . COLONOSCOPY W/ POLYPECTOMY    . CORONARY ARTERY BYPASS GRAFT N/A 12/31/2012   Procedure: CORONARY ARTERY BYPASS GRAFTING times four using left internal mammary and right saphenous vein;  Surgeon: Grace Isaac, MD;  Location: [place];  Service: Open Heart Surgery;  Laterality: N/A;  . ESOPHAGOGASTRODUODENOSCOPY (EGD) WITH PROPOFOL N/A 07/09/2017   Procedure: ESOPHAGOGASTRODUODENOSCOPY (EGD) WITH PROPOFOL;  Surgeon: Wonda Horner, MD;  [facility] ENDOSCOPY;  Service: Endoscopy;  Laterality: N/A;  . EYE SURGERY Bilateral    cataract  . INTRAOPERATIVE TRANSESOPHAGEAL ECHOCARDIOGRAM N/A 12/31/2012   Procedure: INTRAOPERATIVE TRANSESOPHAGEAL ECHOCARDIOGRAM;  Surgeon: Grace Isaac, MD;  Location: [place];  Service: Open Heart Surgery;  Laterality: N/A;  . LEFT HEART CATHETERIZATION WITH CORONARY ANGIOGRAM N/A 12/30/2012   Procedure: LEFT HEART CATHETERIZATION WITH CORONARY ANGIOGRAM;  Surgeon: Burnell Blanks, MD;  Location: [facility] CATH LAB;  Service: Cardiovascular;  Laterality:  N/A;  . REVISON OF ARTERIOVENOUS FISTULA Right 02/06/2018   Procedure: REVISION PLICATION OF ARTERIOVENOUS FISTULA RIGHT ARM;  Surgeon: Waynetta Sandy, MD;  Location: [place];  Service: Vascular;  Laterality: Right;  . REVISON OF ARTERIOVENOUS FISTULA Right 04/03/2018   Procedure: REVISION OF ARTERIOVENOUS FISTULA RIGHT ARM;  Surgeon: Rosetta Posner, MD;  Location: [facility] OR;  Service: Vascular;  Laterality: Right;     Current Outpatient Medications  Medication Sig Dispense Refill  . amLODipine (NORVASC) 5 MG tablet TAKE 1 TABLET BY MOUTH  DAILY 90 tablet 1  . aspirin EC 81 MG tablet Take 1 tablet (81 mg total) by mouth daily. (Patient taking differently: Take 81 mg by mouth every evening. ) 90 tablet 3  . atorvastatin (LIPITOR) 20 MG tablet TAKE 1 TABLET BY MOUTH DAILY (Patient taking differently: Take 20 mg by mouth every evening. ) 90 tablet 1  . B Complex-C-Folic Acid (DIALYVITE 528) 0.8 MG TABS Take 0.8 mg by mouth every evening.   3  . cinacalcet (SENSIPAR) 60 MG tablet Take 60 mg by mouth every evening.     . latanoprost (XALATAN) 0.005 % ophthalmic solution Place 1 drop into both eyes at bedtime.    Marland Kitchen leuprolide (LUPRON) 30 MG injection Inject 30 mg into the muscle every 4 (four) months.    . pantoprazole (PROTONIX) 40 MG tablet Take 1 tablet (40 mg total) by mouth daily. (Patient taking differently: Take 40 mg by mouth every evening. ) 30 tablet 0  . XTANDI 40 MG capsule TAKE 4 CAPSULES (160 MG TOTAL) BY MOUTH DAILY. 120 capsule 0   No current facility-administered medications for this visit.     Allergies:   Patient has no known allergies.    ROS:  Please see the history of present illness.   Otherwise, review of systems are positive for none.   All other systems are reviewed and negative.    PHYSICAL EXAM: VS:  BP (!) 113/43   Pulse 69   Temp (!) 96.6 F (35.9 C)   Ht 6' (1.829 m)   Wt 154 lb (69.9 kg)   SpO2 99%   BMI 20.89 kg/m  , BMI Body mass index is 20.89 kg/m.  GENERAL:  Well appearing NECK:  No jugular venous distention, waveform within normal limits, carotid upstroke brisk and symmetric, no bruits, no thyromegaly LUNGS:  Clear to auscultation bilaterally CHEST: Well healed sternotomy scar. HEART:  PMI not displaced or sustained,S1 and S2 within normal limits, no S3, no S4, no clicks, no rubs, soft 2 out of 6 apical systolic murmur nonradiating, no diastolic murmurs ABD:  Flat, positive bowel sounds normal in frequency in pitch, no bruits, no rebound, no guarding, no midline pulsatile mass, no  hepatomegaly, no splenomegaly EXT:  2 plus pulses throughout, no edema, no cyanosis no clubbing   EKG:  EKG is  ordered today. Sinus rhythm, rate 69, axis within normal limits, intervals within normal limits, inferior and lateral T wave inversions unchanged from previous.   Recent Labs: 10/09/2018: ALT 7; BUN 38; Creatinine 4.38; Hemoglobin 7.6; Platelet Count 282; Potassium 4.2; Sodium 141   Lab Results  Component Value Date   CHOL 103 09/24/2017   TRIG 71 09/24/2017   HDL 41 09/24/2017   LDLCALC 48 09/24/2017     Wt Readings from Last 3 Encounters:  10/16/18 154 lb (69.9 kg)  10/09/18 164 lb (74.4 kg)  08/07/18 162 lb (73.5 kg)      Other studies Reviewed:  Additional studies/ records that were reviewed today include:     ED records, labs Review of the above records demonstrates:   ASSESSMENT AND PLAN:  CAD s/p CABG:  Patient had no chest discomfort.  He had a negative perfusion study with only possible small area of previous old infarct last year.  We will continue with risk reduction.  No change in therapy.   AS: He has a severely calcified noncoronary cusp.  However, he has no significant stenosis.  There is a little aortic insufficiency.  No change in therapy is indicated.   End-stage renal disease on dialysis: No change in therapy.  He tolerates this well without hypotension.  Hypertension: Blood pressures well controlled.  He will continue the meds as listed.  Hyperlipidemia: LDL last year was 48 with an HDL of 41.  I will follow this next year with repeat lipids.  Continue the meds as listed.  Prediabetes: He has been listed as having prediabetes but his last hemoglobin A1c was 4.0.     Current medicines are reviewed at length with the patient today.  The patient does not have concerns regarding medicines.  The following changes have been made:  None  Labs/ tests ordered today include: None  No orders of the defined types were placed in this encounter.     Disposition:   FU with me in one year.     Signed, Minus Breeding, MD  10/16/2018 9:14 AM    [facility]

## 2018-10-14 NOTE — Telephone Encounter (Signed)
Received call from patient spouse and she stated that the patient already has an appointment on 10/21 so he cannot attend the [facility] appointment that has been scheduled. Explained that the schedulers are working off-site so a message has been sent to get that appointment rescheduled. Spouse is aware to call back if she does not hear anything by the end of the week.

## 2018-10-16 ENCOUNTER — Other Ambulatory Visit: Payer: Self-pay

## 2018-10-16 ENCOUNTER — Encounter: Payer: Self-pay | Admitting: Cardiology

## 2018-10-16 ENCOUNTER — Ambulatory Visit: Payer: Medicare Other | Admitting: Cardiology

## 2018-10-16 VITALS — BP 113/43 | HR 69 | Temp 96.6°F | Ht 72.0 in | Wt 154.0 lb

## 2018-10-16 DIAGNOSIS — E785 Hyperlipidemia, unspecified: Secondary | ICD-10-CM

## 2018-10-16 DIAGNOSIS — I2510 Atherosclerotic heart disease of native coronary artery without angina pectoris: Secondary | ICD-10-CM | POA: Diagnosis not present

## 2018-10-16 DIAGNOSIS — I10 Essential (primary) hypertension: Secondary | ICD-10-CM | POA: Diagnosis not present

## 2018-10-16 DIAGNOSIS — I351 Nonrheumatic aortic (valve) insufficiency: Secondary | ICD-10-CM

## 2018-10-16 DIAGNOSIS — N186 End stage renal disease: Secondary | ICD-10-CM

## 2018-10-16 DIAGNOSIS — Z992 Dependence on renal dialysis: Secondary | ICD-10-CM

## 2018-10-16 NOTE — Patient Instructions (Signed)
Medication Instructions:  Your physician recommends that you continue on your current medications as directed. Please refer to the Current Medication list given to you today.  If you need a refill on your cardiac medications before your next appointment, please call your pharmacy.   Lab work: NONE  Testing/Procedures: NONE  Follow-Up: At [facility], you and your health needs are our priority.  As part of our continuing mission to provide you with exceptional heart care, we have created designated Provider Care Teams.  These Care Teams include your primary Cardiologist (physician) and Advanced Practice Providers (APPs -  Physician Assistants and Nurse Practitioners) who all work together to provide you with the care you need, when you need it. You will need a follow up appointment in 12 months.  Please call our office 2 months in advance to schedule this appointment.  You may see one of the following Advanced Practice Providers on your designated Care Team:   Rosaria Ferries, PA-C Jory Sims, DNP, ANP

## 2018-10-24 ENCOUNTER — Other Ambulatory Visit: Payer: Self-pay

## 2018-10-24 ENCOUNTER — Telehealth: Payer: Self-pay

## 2018-10-24 DIAGNOSIS — D649 Anemia, unspecified: Secondary | ICD-10-CM

## 2018-10-24 NOTE — Telephone Encounter (Signed)
Called and spoke to [place] at [facility] and made her aware that patient has been scheduled for lab/transfusion on Friday 10/25/18 at 3:00 pm. She verbalized understanding and will make patient aware.

## 2018-10-24 NOTE — Telephone Encounter (Signed)
-----   Message from Wyatt Portela, MD sent at 10/24/2018 10:21 AM EDT ----- Lab + Transfusion can be moved to 10/16 if possible. Keep 10/23 as well. Thanks.  ----- Message ----- From: Tami Lin, RN Sent: 10/24/2018  10:17 AM EDT To: Wyatt Portela, MD  A nurse from the [facility] left a message with triage and stated patient's hemoglobin that was drawn on 10/13 resulted yesterday at 7.0. Patient's next lab/transfusion appointment is scheduled for 10/23 at the [facility]. Physician at [place] wants to know if patient's appointment for blood at the [facility] should be sooner than 10/23. Lanelle Bal

## 2018-10-25 ENCOUNTER — Other Ambulatory Visit: Payer: Self-pay

## 2018-10-25 ENCOUNTER — Telehealth: Payer: Self-pay | Admitting: *Deleted

## 2018-10-25 ENCOUNTER — Inpatient Hospital Stay: Payer: Medicare Other | Attending: Oncology

## 2018-10-25 ENCOUNTER — Inpatient Hospital Stay: Payer: Medicare Other

## 2018-10-25 DIAGNOSIS — C7951 Secondary malignant neoplasm of bone: Secondary | ICD-10-CM | POA: Diagnosis not present

## 2018-10-25 DIAGNOSIS — D649 Anemia, unspecified: Secondary | ICD-10-CM

## 2018-10-25 DIAGNOSIS — C61 Malignant neoplasm of prostate: Secondary | ICD-10-CM | POA: Diagnosis present

## 2018-10-25 DIAGNOSIS — D630 Anemia in neoplastic disease: Secondary | ICD-10-CM | POA: Insufficient documentation

## 2018-10-25 LAB — CMP (CANCER CENTER ONLY)
ALT: 7 U/L (ref 0–44)
AST: 17 U/L (ref 15–41)
Albumin: 2.8 g/dL — ABNORMAL LOW (ref 3.5–5.0)
Alkaline Phosphatase: 87 U/L (ref 38–126)
Anion gap: 17 — ABNORMAL HIGH (ref 5–15)
BUN: 60 mg/dL — ABNORMAL HIGH (ref 8–23)
CO2: 27 mmol/L (ref 22–32)
Calcium: 8.3 mg/dL — ABNORMAL LOW (ref 8.9–10.3)
Chloride: 98 mmol/L (ref 98–111)
Creatinine: 4.36 mg/dL (ref 0.61–1.24)
GFR, Est AFR Am: 13 mL/min — ABNORMAL LOW (ref >60)
GFR, Estimated: 11 mL/min — ABNORMAL LOW (ref >60)
Glucose, Bld: 96 mg/dL (ref 70–99)
Potassium: 4.4 mmol/L (ref 3.5–5.1)
Sodium: 142 mmol/L (ref 135–145)
Total Bilirubin: 0.4 mg/dL (ref 0.3–1.2)
Total Protein: 6.6 g/dL (ref 6.5–8.1)

## 2018-10-25 LAB — SAMPLE TO BLOOD BANK

## 2018-10-25 LAB — CBC WITH DIFFERENTIAL (CANCER CENTER ONLY)
Abs Immature Granulocytes: 0.01 10*3/uL (ref 0.00–0.07)
Basophils Absolute: 0 10*3/uL (ref 0.0–0.1)
Basophils Relative: 0 %
Eosinophils Absolute: 0 10*3/uL (ref 0.0–0.5)
Eosinophils Relative: 1 %
HCT: 21.4 % — ABNORMAL LOW (ref 39.0–52.0)
Hemoglobin: 6.6 g/dL — CL (ref 13.0–17.0)
Immature Granulocytes: 0 %
Lymphocytes Relative: 13 %
Lymphs Abs: 0.6 10*3/uL — ABNORMAL LOW (ref 0.7–4.0)
MCH: 28.6 pg (ref 26.0–34.0)
MCHC: 30.8 g/dL (ref 30.0–36.0)
MCV: 92.6 fL (ref 80.0–100.0)
Monocytes Absolute: 0.5 10*3/uL (ref 0.1–1.0)
Monocytes Relative: 11 %
Neutro Abs: 3.4 10*3/uL (ref 1.7–7.7)
Neutrophils Relative %: 75 %
Platelet Count: 242 10*3/uL (ref 150–400)
RBC: 2.31 MIL/uL — ABNORMAL LOW (ref 4.22–5.81)
RDW: 17.8 % — ABNORMAL HIGH (ref 11.5–15.5)
WBC Count: 4.6 10*3/uL (ref 4.0–10.5)
nRBC: 0 % (ref 0.0–0.2)

## 2018-10-25 LAB — PREPARE RBC (CROSSMATCH)

## 2018-10-25 MED ORDER — DIPHENHYDRAMINE HCL 25 MG PO CAPS
25.0000 mg | ORAL_CAPSULE | Freq: Once | ORAL | Status: AC
  Administered 2018-10-25: 25 mg via ORAL

## 2018-10-25 MED ORDER — ACETAMINOPHEN 325 MG PO TABS
ORAL_TABLET | ORAL | Status: AC
  Filled 2018-10-25: qty 2

## 2018-10-25 MED ORDER — ACETAMINOPHEN 325 MG PO TABS
650.0000 mg | ORAL_TABLET | Freq: Once | ORAL | Status: AC
  Administered 2018-10-25: 650 mg via ORAL

## 2018-10-25 MED ORDER — DIPHENHYDRAMINE HCL 25 MG PO CAPS
ORAL_CAPSULE | ORAL | Status: AC
  Filled 2018-10-25: qty 1

## 2018-10-25 MED ORDER — SODIUM CHLORIDE 0.9% IV SOLUTION
250.0000 mL | Freq: Once | INTRAVENOUS | Status: AC
  Administered 2018-10-25: 250 mL via INTRAVENOUS
  Filled 2018-10-25: qty 250

## 2018-10-25 NOTE — Patient Instructions (Signed)
Blood Transfusion, Adult, Care After This sheet gives you information about how to care for yourself after your procedure. Your doctor may also give you more specific instructions. If you have problems or questions, contact your doctor. Follow these instructions at home:   Take over-the-counter and prescription medicines only as told by your doctor.  Go back to your normal activities as told by your doctor.  Follow instructions from your doctor about how to take care of the area where an IV tube was put into your vein (insertion site). Make sure you: ? Wash your hands with soap and water before you change your bandage (dressing). If there is no soap and water, use hand sanitizer. ? Change your bandage as told by your doctor.  Check your IV insertion site every day for signs of infection. Check for: ? More redness, swelling, or pain. ? More fluid or blood. ? Warmth. ? Pus or a bad smell. Contact a doctor if:  You have more redness, swelling, or pain around the IV insertion site.  You have more fluid or blood coming from the IV insertion site.  Your IV insertion site feels warm to the touch.  You have pus or a bad smell coming from the IV insertion site.  Your pee (urine) turns pink, red, or brown.  You feel weak after doing your normal activities. Get help right away if:  You have signs of a serious allergic or body defense (immune) system reaction, including: ? Itchiness. ? Hives. ? Trouble breathing. ? Anxiety. ? Pain in your chest or lower back. ? Fever, flushing, and chills. ? Fast pulse. ? Rash. ? Watery poop (diarrhea). ? Throwing up (vomiting). ? Dark pee. ? Serious headache. ? Dizziness. ? Stiff neck. ? Yellow color in your face or the white parts of your eyes (jaundice). Summary  After a blood transfusion, return to your normal activities as told by your doctor.  Every day, check for signs of infection where the IV tube was put into your vein.  Some  signs of infection are warm skin, more redness and pain, more fluid or blood, and pus or a bad smell where the needle went in.  Contact your doctor if you feel weak or have any unusual symptoms. This information is not intended to replace advice given to you by your health care provider. Make sure you discuss any questions you have with your health care provider. Document Released: 01/16/2014 Document Revised: 05/02/2017 Document Reviewed: 08/20/2015 Elsevier Patient Education  2020 Elsevier Inc.  

## 2018-10-25 NOTE — Progress Notes (Signed)
Dr. Alen Blew made aware of hemoglobin 6.6. Per Dr. Alen Blew, order placed for one unit of blood.

## 2018-10-25 NOTE — Telephone Encounter (Signed)
CRITICAL VALUE STICKER  CRITICAL VALUE: Creat = 4.36.  RECEIVER (on-site recipient of call): Geraldy Akridge Winston-Spruiell RN, Triage [place].   DATE & TIME NOTIFIED: 10/25/2018 at 1443.   MESSENGER (representative from lab): Corbin Ade [facility].  MD NOTIFIED: Collaborative nurse.  TIME OF NOTIFICATION: 10/25/2018 at 1502.  RESPONSE: None.

## 2018-10-26 LAB — PROSTATE-SPECIFIC AG, SERUM (LABCORP): Prostate Specific Ag, Serum: <0.1 ng/mL (ref 0.0–4.0)

## 2018-10-26 LAB — BPAM RBC
Blood Product Expiration Date: 202011172359
ISSUE DATE / TIME: 202010161514
Unit Type and Rh: 5100

## 2018-10-26 LAB — TYPE AND SCREEN
ABO/RH(D): O POS
Antibody Screen: NEGATIVE
Unit division: 0

## 2018-10-30 ENCOUNTER — Other Ambulatory Visit: Payer: Medicare Other

## 2018-11-01 ENCOUNTER — Other Ambulatory Visit: Payer: Self-pay

## 2018-11-01 ENCOUNTER — Inpatient Hospital Stay: Payer: Medicare Other

## 2018-11-01 DIAGNOSIS — D649 Anemia, unspecified: Secondary | ICD-10-CM

## 2018-11-01 DIAGNOSIS — C61 Malignant neoplasm of prostate: Secondary | ICD-10-CM | POA: Diagnosis not present

## 2018-11-01 LAB — CBC WITH DIFFERENTIAL (CANCER CENTER ONLY)
Abs Immature Granulocytes: 0.01 10*3/uL (ref 0.00–0.07)
Basophils Absolute: 0 10*3/uL (ref 0.0–0.1)
Basophils Relative: 1 %
Eosinophils Absolute: 0 10*3/uL (ref 0.0–0.5)
Eosinophils Relative: 0 %
HCT: 23.5 % — ABNORMAL LOW (ref 39.0–52.0)
Hemoglobin: 7.2 g/dL — ABNORMAL LOW (ref 13.0–17.0)
Immature Granulocytes: 0 %
Lymphocytes Relative: 13 %
Lymphs Abs: 0.6 10*3/uL — ABNORMAL LOW (ref 0.7–4.0)
MCH: 28.8 pg (ref 26.0–34.0)
MCHC: 30.6 g/dL (ref 30.0–36.0)
MCV: 94 fL (ref 80.0–100.0)
Monocytes Absolute: 0.5 10*3/uL (ref 0.1–1.0)
Monocytes Relative: 11 %
Neutro Abs: 3.4 10*3/uL (ref 1.7–7.7)
Neutrophils Relative %: 75 %
Platelet Count: 263 10*3/uL (ref 150–400)
RBC: 2.5 MIL/uL — ABNORMAL LOW (ref 4.22–5.81)
RDW: 16.5 % — ABNORMAL HIGH (ref 11.5–15.5)
WBC Count: 4.6 10*3/uL (ref 4.0–10.5)
nRBC: 0 % (ref 0.0–0.2)

## 2018-11-01 LAB — SAMPLE TO BLOOD BANK

## 2018-11-01 LAB — PREPARE RBC (CROSSMATCH)

## 2018-11-01 MED ORDER — SODIUM CHLORIDE 0.9% IV SOLUTION
250.0000 mL | Freq: Once | INTRAVENOUS | Status: AC
  Administered 2018-11-01: 250 mL via INTRAVENOUS
  Filled 2018-11-01: qty 250

## 2018-11-01 MED ORDER — DIPHENHYDRAMINE HCL 25 MG PO CAPS
ORAL_CAPSULE | ORAL | Status: AC
  Filled 2018-11-01: qty 1

## 2018-11-01 MED ORDER — ACETAMINOPHEN 325 MG PO TABS
650.0000 mg | ORAL_TABLET | Freq: Once | ORAL | Status: AC
  Administered 2018-11-01: 650 mg via ORAL

## 2018-11-01 MED ORDER — DIPHENHYDRAMINE HCL 25 MG PO CAPS
25.0000 mg | ORAL_CAPSULE | Freq: Once | ORAL | Status: AC
  Administered 2018-11-01: 25 mg via ORAL

## 2018-11-01 MED ORDER — ACETAMINOPHEN 325 MG PO TABS
ORAL_TABLET | ORAL | Status: AC
  Filled 2018-11-01: qty 2

## 2018-11-01 NOTE — Patient Instructions (Signed)
Blood Transfusion, Adult, Care After This sheet gives you information about how to care for yourself after your procedure. Your health care provider may also give you more specific instructions. If you have problems or questions, contact your health care provider. What can I expect after the procedure? After your procedure, it is common to have:  Bruising and soreness where the IV tube was inserted.  Headache. Follow these instructions at home:   Take over-the-counter and prescription medicines only as told by your health care provider.  Return to your normal activities as told by your health care provider.  Follow instructions from your health care provider about how to take care of your IV insertion site. Make sure you: ? Wash your hands with soap and water before you change your bandage (dressing). If soap and water are not available, use hand sanitizer. ? Change your dressing as told by your health care provider.  Check your IV insertion site every day for signs of infection. Check for: ? More redness, swelling, or pain. ? More fluid or blood. ? Warmth. ? Pus or a bad smell. Contact a health care provider if:  You have more redness, swelling, or pain around the IV insertion site.  You have more fluid or blood coming from the IV insertion site.  Your IV insertion site feels warm to the touch.  You have pus or a bad smell coming from the IV insertion site.  Your urine turns pink, red, or brown.  You feel weak after doing your normal activities. Get help right away if:  You have signs of a serious allergic or immune system reaction, including: ? Itchiness. ? Hives. ? Trouble breathing. ? Anxiety. ? Chest or lower back pain. ? Fever, flushing, and chills. ? Rapid pulse. ? Rash. ? Diarrhea. ? Vomiting. ? Dark urine. ? Serious headache. ? Dizziness. ? Stiff neck. ? Yellow coloration of the face or the white parts of the eyes (jaundice). This information is not  intended to replace advice given to you by your health care provider. Make sure you discuss any questions you have with your health care provider. Document Released: 01/16/2014 Document Revised: 10/23/2016 Document Reviewed: 07/12/2015 Elsevier Patient Education  2020 Elsevier Inc.  

## 2018-11-03 LAB — TYPE AND SCREEN
ABO/RH(D): O POS
Antibody Screen: NEGATIVE
Unit division: 0

## 2018-11-03 LAB — BPAM RBC
Blood Product Expiration Date: 202011282359
ISSUE DATE / TIME: 202010231325
Unit Type and Rh: 5100

## 2018-11-04 ENCOUNTER — Other Ambulatory Visit: Payer: Self-pay | Admitting: Oncology

## 2018-11-04 DIAGNOSIS — C61 Malignant neoplasm of prostate: Secondary | ICD-10-CM

## 2018-11-06 ENCOUNTER — Telehealth: Payer: Self-pay

## 2018-11-06 NOTE — Telephone Encounter (Signed)
Oral Oncology Patient Advocate Encounter  Was successful in securing patient an $ 6500 grant from [employer] (PAF) to provide copayment coverage for his Xtandi.  This will keep the out of pocket expense at $0.    I called the patient and left a voicemail.    The billing information is as follows and has been shared with [place].   Member ID: [id number] Group ID: [id number] RxBin: [id number] Dates of Eligibility: 11/05/18 through 11/05/19  [place] [facility] [place] Phone [phone] Fax [phone] 11/06/2018    2:07 PM

## 2018-11-07 MED FILL — XTANDI 40 MG CAPSULE: 40 | 30 days supply | Qty: 120 | Fill #0

## 2018-11-20 ENCOUNTER — Other Ambulatory Visit: Payer: Self-pay | Admitting: *Deleted

## 2018-11-20 ENCOUNTER — Inpatient Hospital Stay: Payer: Medicare Other | Attending: Oncology

## 2018-11-20 ENCOUNTER — Inpatient Hospital Stay: Payer: Medicare Other

## 2018-11-20 ENCOUNTER — Other Ambulatory Visit: Payer: Self-pay

## 2018-11-20 DIAGNOSIS — D649 Anemia, unspecified: Secondary | ICD-10-CM

## 2018-11-20 DIAGNOSIS — C61 Malignant neoplasm of prostate: Secondary | ICD-10-CM

## 2018-11-20 DIAGNOSIS — D630 Anemia in neoplastic disease: Secondary | ICD-10-CM | POA: Insufficient documentation

## 2018-11-20 LAB — SAMPLE TO BLOOD BANK

## 2018-11-20 LAB — CBC WITH DIFFERENTIAL (CANCER CENTER ONLY)
Abs Immature Granulocytes: 0.01 10*3/uL (ref 0.00–0.07)
Basophils Absolute: 0 10*3/uL (ref 0.0–0.1)
Basophils Relative: 1 %
Eosinophils Absolute: 0.1 10*3/uL (ref 0.0–0.5)
Eosinophils Relative: 1 %
HCT: 22.6 % — ABNORMAL LOW (ref 39.0–52.0)
Hemoglobin: 6.7 g/dL — CL (ref 13.0–17.0)
Immature Granulocytes: 0 %
Lymphocytes Relative: 13 %
Lymphs Abs: 0.7 10*3/uL (ref 0.7–4.0)
MCH: 27.1 pg (ref 26.0–34.0)
MCHC: 29.6 g/dL — ABNORMAL LOW (ref 30.0–36.0)
MCV: 91.5 fL (ref 80.0–100.0)
Monocytes Absolute: 0.5 10*3/uL (ref 0.1–1.0)
Monocytes Relative: 11 %
Neutro Abs: 3.7 10*3/uL (ref 1.7–7.7)
Neutrophils Relative %: 74 %
Platelet Count: 293 10*3/uL (ref 150–400)
RBC: 2.47 MIL/uL — ABNORMAL LOW (ref 4.22–5.81)
RDW: 18.6 % — ABNORMAL HIGH (ref 11.5–15.5)
WBC Count: 4.9 10*3/uL (ref 4.0–10.5)
nRBC: 0 % (ref 0.0–0.2)

## 2018-11-20 LAB — CMP (CANCER CENTER ONLY)
ALT: 6 U/L (ref 0–44)
AST: 15 U/L (ref 15–41)
Albumin: 2.7 g/dL — ABNORMAL LOW (ref 3.5–5.0)
Alkaline Phosphatase: 78 U/L (ref 38–126)
Anion gap: 16 — ABNORMAL HIGH (ref 5–15)
BUN: 36 mg/dL — ABNORMAL HIGH (ref 8–23)
CO2: 32 mmol/L (ref 22–32)
Calcium: 8.6 mg/dL — ABNORMAL LOW (ref 8.9–10.3)
Chloride: 97 mmol/L — ABNORMAL LOW (ref 98–111)
Creatinine: 4.74 mg/dL (ref 0.61–1.24)
GFR, Est AFR Am: 12 mL/min — ABNORMAL LOW (ref >60)
GFR, Estimated: 10 mL/min — ABNORMAL LOW (ref >60)
Glucose, Bld: 101 mg/dL — ABNORMAL HIGH (ref 70–99)
Potassium: 3.6 mmol/L (ref 3.5–5.1)
Sodium: 145 mmol/L (ref 135–145)
Total Bilirubin: 0.4 mg/dL (ref 0.3–1.2)
Total Protein: 6.7 g/dL (ref 6.5–8.1)

## 2018-11-20 LAB — PREPARE RBC (CROSSMATCH)

## 2018-11-20 MED ORDER — DIPHENHYDRAMINE HCL 25 MG PO CAPS
ORAL_CAPSULE | ORAL | Status: AC
  Filled 2018-11-20: qty 1

## 2018-11-20 MED ORDER — ACETAMINOPHEN 325 MG PO TABS
ORAL_TABLET | ORAL | Status: AC
  Filled 2018-11-20: qty 2

## 2018-11-20 MED ORDER — ACETAMINOPHEN 325 MG PO TABS
650.0000 mg | ORAL_TABLET | Freq: Once | ORAL | Status: AC
  Administered 2018-11-20: 650 mg via ORAL

## 2018-11-20 MED ORDER — SODIUM CHLORIDE 0.9% IV SOLUTION
250.0000 mL | Freq: Once | INTRAVENOUS | Status: AC
  Administered 2018-11-20: 250 mL via INTRAVENOUS
  Filled 2018-11-20: qty 250

## 2018-11-20 MED ORDER — DIPHENHYDRAMINE HCL 25 MG PO CAPS
25.0000 mg | ORAL_CAPSULE | Freq: Once | ORAL | Status: AC
  Administered 2018-11-20: 25 mg via ORAL

## 2018-11-20 NOTE — Patient Instructions (Addendum)
Blood Transfusion, Adult, Care After This sheet gives you information about how to care for yourself after your procedure. Your doctor may also give you more specific instructions. If you have problems or questions, contact your doctor. Follow these instructions at home:   Take over-the-counter and prescription medicines only as told by your doctor.  Go back to your normal activities as told by your doctor.  Follow instructions from your doctor about how to take care of the area where an IV tube was put into your vein (insertion site). Make sure you: ? Wash your hands with soap and water before you change your bandage (dressing). If there is no soap and water, use hand sanitizer. ? Change your bandage as told by your doctor.  Check your IV insertion site every day for signs of infection. Check for: ? More redness, swelling, or pain. ? More fluid or blood. ? Warmth. ? Pus or a bad smell. Contact a doctor if:  You have more redness, swelling, or pain around the IV insertion site.  You have more fluid or blood coming from the IV insertion site.  Your IV insertion site feels warm to the touch.  You have pus or a bad smell coming from the IV insertion site.  Your pee (urine) turns pink, red, or brown.  You feel weak after doing your normal activities. Get help right away if:  You have signs of a serious allergic or body defense (immune) system reaction, including: ? Itchiness. ? Hives. ? Trouble breathing. ? Anxiety. ? Pain in your chest or lower back. ? Fever, flushing, and chills. ? Fast pulse. ? Rash. ? Watery poop (diarrhea). ? Throwing up (vomiting). ? Dark pee. ? Serious headache. ? Dizziness. ? Stiff neck. ? Yellow color in your face or the white parts of your eyes (jaundice). Summary  After a blood transfusion, return to your normal activities as told by your doctor.  Every day, check for signs of infection where the IV tube was put into your vein.  Some  signs of infection are warm skin, more redness and pain, more fluid or blood, and pus or a bad smell where the needle went in.  Contact your doctor if you feel weak or have any unusual symptoms. This information is not intended to replace advice given to you by your health care provider. Make sure you discuss any questions you have with your health care provider. Document Released: 01/16/2014 Document Revised: 05/02/2017 Document Reviewed: 08/20/2015 Elsevier Patient Education  2020 Elsevier Inc.  Coronavirus (COVID-19) Are you at risk?  Are you at risk for the Coronavirus (COVID-19)?  To be considered HIGH RISK for Coronavirus (COVID-19), you have to meet the following criteria:  . Traveled to [place], [place], [place], [place] or [place]; or in the [place] to [place], [place], [place], or [place]; and have fever, cough, and shortness of breath within the last 2 weeks of travel OR . Been in close contact with a person diagnosed with COVID-19 within the last 2 weeks and have fever, cough, and shortness of breath . IF YOU DO NOT MEET THESE CRITERIA, YOU ARE CONSIDERED LOW RISK FOR COVID-19.  What to do if you are HIGH RISK for COVID-19?  . If you are having a medical emergency, call 911. . Seek medical care right away. Before you go to a doctor's office, urgent care or emergency department, call ahead and tell them about your recent travel, contact with someone diagnosed with COVID-19, and   your symptoms. You should receive instructions from your physician's office regarding next steps of care.  . When you arrive at healthcare provider, tell the healthcare staff immediately you have returned from visiting [place], [place], [place], [place] or [place]; or traveled in the [place] to [place], [place], [place], or [place]; in the last two weeks or you have been in close contact with a person diagnosed with COVID-19 in the last 2 weeks.   . Tell the health care staff about  your symptoms: fever, cough and shortness of breath. . After you have been seen by a medical provider, you will be either: o Tested for (COVID-19) and discharged home on quarantine except to seek medical care if symptoms worsen, and asked to  - Stay home and avoid contact with others until you get your results (4-5 days)  - Avoid travel on public transportation if possible (such as bus, train, or airplane) or o Sent to the Emergency Department by EMS for evaluation, COVID-19 testing, and possible admission depending on your condition and test results.  What to do if you are LOW RISK for COVID-19?  Reduce your risk of any infection by using the same precautions used for avoiding the common cold or flu:  . Wash your hands often with soap and warm water for at least 20 seconds.  If soap and water are not readily available, use an alcohol-based hand sanitizer with at least 60% alcohol.  . If coughing or sneezing, cover your mouth and nose by coughing or sneezing into the elbow areas of your shirt or coat, into a tissue or into your sleeve (not your hands). . Avoid shaking hands with others and consider head nods or verbal greetings only. . Avoid touching your eyes, nose, or mouth with unwashed hands.  . Avoid close contact with people who are sick. . Avoid places or events with large numbers of people in one location, like concerts or sporting events. . Carefully consider travel plans you have or are making. . If you are planning any travel outside or inside the [place], visit the CDC's Travelers' Health webpage for the latest health notices. . If you have some symptoms but not all symptoms, continue to monitor at home and seek medical attention if your symptoms worsen. . If you are having a medical emergency, call 911.   ADDITIONAL HEALTHCARE OPTIONS FOR PATIENTS  [facility] Tele[facility] / e-Visit: [email or web address]         [facility]:  [phone]   Urgent Care: [phone]                   [facility]: [phone]   

## 2018-11-20 NOTE — Progress Notes (Signed)
CRITICAL VALUE STICKER  CRITICAL VALUE: Hgb 6.7 RECEIVER (on-site recipient of call): Manuela Schwartz, [place] NOTIFIED: 11/20/18 @ [place] (representative from lab): Domingo Mend MD NOTIFIED: Dr. Alen Blew  TIME OF NOTIFICATION: 9906  RESPONSE: Proceed w/transfusion as planned

## 2018-11-21 LAB — BPAM RBC
Blood Product Expiration Date: 202012102359
Blood Product Expiration Date: 202012102359
ISSUE DATE / TIME: 202011111018
ISSUE DATE / TIME: 202011111018
Unit Type and Rh: 5100
Unit Type and Rh: 5100

## 2018-11-21 LAB — TYPE AND SCREEN
ABO/RH(D): O POS
Antibody Screen: NEGATIVE
Unit division: 0
Unit division: 0

## 2018-11-21 LAB — PROSTATE-SPECIFIC AG, SERUM (LABCORP): Prostate Specific Ag, Serum: 0.1 ng/mL (ref 0.0–4.0)

## 2018-12-02 ENCOUNTER — Other Ambulatory Visit: Payer: Self-pay | Admitting: Oncology

## 2018-12-02 DIAGNOSIS — C61 Malignant neoplasm of prostate: Secondary | ICD-10-CM

## 2018-12-09 MED FILL — XTANDI 40 MG CAPSULE: 40 | 30 days supply | Qty: 120 | Fill #0

## 2018-12-11 ENCOUNTER — Inpatient Hospital Stay: Payer: Medicare Other | Attending: Oncology

## 2018-12-11 ENCOUNTER — Telehealth: Payer: Self-pay

## 2018-12-11 ENCOUNTER — Inpatient Hospital Stay: Payer: Medicare Other

## 2018-12-11 ENCOUNTER — Other Ambulatory Visit: Payer: Self-pay

## 2018-12-11 DIAGNOSIS — D630 Anemia in neoplastic disease: Secondary | ICD-10-CM | POA: Insufficient documentation

## 2018-12-11 DIAGNOSIS — C7951 Secondary malignant neoplasm of bone: Secondary | ICD-10-CM | POA: Diagnosis not present

## 2018-12-11 DIAGNOSIS — N19 Unspecified kidney failure: Secondary | ICD-10-CM | POA: Diagnosis not present

## 2018-12-11 DIAGNOSIS — C61 Malignant neoplasm of prostate: Secondary | ICD-10-CM | POA: Diagnosis not present

## 2018-12-11 LAB — CBC WITH DIFFERENTIAL (CANCER CENTER ONLY)
Abs Immature Granulocytes: 0.03 10*3/uL (ref 0.00–0.07)
Basophils Absolute: 0 10*3/uL (ref 0.0–0.1)
Basophils Relative: 0 %
Eosinophils Absolute: 0 10*3/uL (ref 0.0–0.5)
Eosinophils Relative: 0 %
HCT: 37.2 % — ABNORMAL LOW (ref 39.0–52.0)
Hemoglobin: 11.3 g/dL — ABNORMAL LOW (ref 13.0–17.0)
Immature Granulocytes: 0 %
Lymphocytes Relative: 7 %
Lymphs Abs: 0.7 10*3/uL (ref 0.7–4.0)
MCH: 28.8 pg (ref 26.0–34.0)
MCHC: 30.4 g/dL (ref 30.0–36.0)
MCV: 94.9 fL (ref 80.0–100.0)
Monocytes Absolute: 0.8 10*3/uL (ref 0.1–1.0)
Monocytes Relative: 9 %
Neutro Abs: 7.9 10*3/uL — ABNORMAL HIGH (ref 1.7–7.7)
Neutrophils Relative %: 84 %
Platelet Count: 343 10*3/uL (ref 150–400)
RBC: 3.92 MIL/uL — ABNORMAL LOW (ref 4.22–5.81)
RDW: 18.9 % — ABNORMAL HIGH (ref 11.5–15.5)
WBC Count: 9.4 10*3/uL (ref 4.0–10.5)
nRBC: 0 % (ref 0.0–0.2)

## 2018-12-11 LAB — CMP (CANCER CENTER ONLY)
ALT: 9 U/L (ref 0–44)
AST: 19 U/L (ref 15–41)
Albumin: 3.1 g/dL — ABNORMAL LOW (ref 3.5–5.0)
Alkaline Phosphatase: 107 U/L (ref 38–126)
Anion gap: 16 — ABNORMAL HIGH (ref 5–15)
BUN: 37 mg/dL — ABNORMAL HIGH (ref 8–23)
CO2: 27 mmol/L (ref 22–32)
Calcium: 9.5 mg/dL (ref 8.9–10.3)
Chloride: 98 mmol/L (ref 98–111)
Creatinine: 4.97 mg/dL (ref 0.61–1.24)
GFR, Est AFR Am: 11 mL/min — ABNORMAL LOW (ref >60)
GFR, Estimated: 10 mL/min — ABNORMAL LOW (ref >60)
Glucose, Bld: 89 mg/dL (ref 70–99)
Potassium: 4.7 mmol/L (ref 3.5–5.1)
Sodium: 141 mmol/L (ref 135–145)
Total Bilirubin: 0.5 mg/dL (ref 0.3–1.2)
Total Protein: 7.5 g/dL (ref 6.5–8.1)

## 2018-12-11 LAB — SAMPLE TO BLOOD BANK

## 2018-12-11 NOTE — Progress Notes (Unsigned)
Dr. Alen Blew notified of Pt's Hb 11.3g. No transfusion today per Dr. Alen Blew. Pt made aware that he did not need to have a blood transfusion today and given copy of his lab work.

## 2018-12-11 NOTE — Telephone Encounter (Signed)
Critical creatinine result of 4.97 received from [place] in the lab at 0917.  Dr. Alen Blew made aware. No further orders received.

## 2018-12-12 LAB — PROSTATE-SPECIFIC AG, SERUM (LABCORP): Prostate Specific Ag, Serum: 0.1 ng/mL (ref 0.0–4.0)

## 2018-12-25 ENCOUNTER — Other Ambulatory Visit: Payer: Self-pay | Admitting: Physician Assistant

## 2018-12-31 ENCOUNTER — Other Ambulatory Visit: Payer: Self-pay | Admitting: Oncology

## 2018-12-31 DIAGNOSIS — C61 Malignant neoplasm of prostate: Secondary | ICD-10-CM

## 2019-01-01 ENCOUNTER — Inpatient Hospital Stay: Payer: Medicare Other

## 2019-01-01 ENCOUNTER — Inpatient Hospital Stay: Payer: Medicare Other | Admitting: Oncology

## 2019-01-01 ENCOUNTER — Other Ambulatory Visit: Payer: Self-pay

## 2019-01-01 VITALS — BP 116/41 | HR 66 | Temp 98.0°F | Resp 18 | Wt 165.8 lb

## 2019-01-01 DIAGNOSIS — D649 Anemia, unspecified: Secondary | ICD-10-CM

## 2019-01-01 DIAGNOSIS — C61 Malignant neoplasm of prostate: Secondary | ICD-10-CM

## 2019-01-01 LAB — CBC WITH DIFFERENTIAL (CANCER CENTER ONLY)
Abs Immature Granulocytes: 0.01 10*3/uL (ref 0.00–0.07)
Basophils Absolute: 0 10*3/uL (ref 0.0–0.1)
Basophils Relative: 1 %
Eosinophils Absolute: 0.1 10*3/uL (ref 0.0–0.5)
Eosinophils Relative: 1 %
HCT: 27.2 % — ABNORMAL LOW (ref 39.0–52.0)
Hemoglobin: 8.3 g/dL — ABNORMAL LOW (ref 13.0–17.0)
Immature Granulocytes: 0 %
Lymphocytes Relative: 23 %
Lymphs Abs: 1 10*3/uL (ref 0.7–4.0)
MCH: 27.1 pg (ref 26.0–34.0)
MCHC: 30.5 g/dL (ref 30.0–36.0)
MCV: 88.9 fL (ref 80.0–100.0)
Monocytes Absolute: 0.6 10*3/uL (ref 0.1–1.0)
Monocytes Relative: 15 %
Neutro Abs: 2.6 10*3/uL (ref 1.7–7.7)
Neutrophils Relative %: 60 %
Platelet Count: 324 10*3/uL (ref 150–400)
RBC: 3.06 MIL/uL — ABNORMAL LOW (ref 4.22–5.81)
RDW: 18 % — ABNORMAL HIGH (ref 11.5–15.5)
WBC Count: 4.3 10*3/uL (ref 4.0–10.5)
nRBC: 0 % (ref 0.0–0.2)

## 2019-01-01 LAB — SAMPLE TO BLOOD BANK

## 2019-01-01 LAB — PREPARE RBC (CROSSMATCH)

## 2019-01-01 MED ORDER — DIPHENHYDRAMINE HCL 25 MG PO CAPS
ORAL_CAPSULE | ORAL | Status: AC
  Filled 2019-01-01: qty 1

## 2019-01-01 MED ORDER — SODIUM CHLORIDE 0.9% IV SOLUTION
250.0000 mL | Freq: Once | INTRAVENOUS | Status: AC
  Administered 2019-01-01: 10:00:00 250 mL via INTRAVENOUS
  Filled 2019-01-01: qty 250

## 2019-01-01 MED ORDER — ACETAMINOPHEN 325 MG PO TABS
ORAL_TABLET | ORAL | Status: AC
  Filled 2019-01-01: qty 2

## 2019-01-01 MED ORDER — ACETAMINOPHEN 325 MG PO TABS
650.0000 mg | ORAL_TABLET | Freq: Once | ORAL | Status: AC
  Administered 2019-01-01: 10:00:00 650 mg via ORAL

## 2019-01-01 MED ORDER — DIPHENHYDRAMINE HCL 25 MG PO CAPS
25.0000 mg | ORAL_CAPSULE | Freq: Once | ORAL | Status: AC
  Administered 2019-01-01: 10:00:00 25 mg via ORAL

## 2019-01-01 NOTE — Patient Instructions (Signed)
Blood Transfusion, Adult, Care After This sheet gives you information about how to care for yourself after your procedure. Your doctor may also give you more specific instructions. If you have problems or questions, contact your doctor. Follow these instructions at home:   Take over-the-counter and prescription medicines only as told by your doctor.  Go back to your normal activities as told by your doctor.  Follow instructions from your doctor about how to take care of the area where an IV tube was put into your vein (insertion site). Make sure you: ? Wash your hands with soap and water before you change your bandage (dressing). If there is no soap and water, use hand sanitizer. ? Change your bandage as told by your doctor.  Check your IV insertion site every day for signs of infection. Check for: ? More redness, swelling, or pain. ? More fluid or blood. ? Warmth. ? Pus or a bad smell. Contact a doctor if:  You have more redness, swelling, or pain around the IV insertion site.  You have more fluid or blood coming from the IV insertion site.  Your IV insertion site feels warm to the touch.  You have pus or a bad smell coming from the IV insertion site.  Your pee (urine) turns pink, red, or brown.  You feel weak after doing your normal activities. Get help right away if:  You have signs of a serious allergic or body defense (immune) system reaction, including: ? Itchiness. ? Hives. ? Trouble breathing. ? Anxiety. ? Pain in your chest or lower back. ? Fever, flushing, and chills. ? Fast pulse. ? Rash. ? Watery poop (diarrhea). ? Throwing up (vomiting). ? Dark pee. ? Serious headache. ? Dizziness. ? Stiff neck. ? Yellow color in your face or the white parts of your eyes (jaundice). Summary  After a blood transfusion, return to your normal activities as told by your doctor.  Every day, check for signs of infection where the IV tube was put into your vein.  Some  signs of infection are warm skin, more redness and pain, more fluid or blood, and pus or a bad smell where the needle went in.  Contact your doctor if you feel weak or have any unusual symptoms. This information is not intended to replace advice given to you by your health care provider. Make sure you discuss any questions you have with your health care provider. Document Released: 01/16/2014 Document Revised: 05/02/2017 Document Reviewed: 08/20/2015 Elsevier Patient Education  2020 Elsevier Inc.  

## 2019-01-01 NOTE — Progress Notes (Signed)
Hematology and Oncology Follow Up Visit  [name] [name] [id number] [birth year] 85 y.o. 01/01/2019 9:00 AM [name] [name], MDElkins, [name] [name], *   Principle Diagnosis: 85 year old man with:  1.  Advanced prostate cancer with pelvic adenopathy diagnosed in 2017.  He has castration-resistant after initially presenting with Gleason score 4+4 = 8 PSA of 369.   2.  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  Prior Therapy: Androgen deprivation therapy and he remains on it under the care of Dr. Diona [name]. His PSA decreased to 31 in 2017. His PSA in November 2017 was 41 and in March 2018 was 29.    Current therapy:   Xtandi 160 mg daily started in March 2019.  Supportive transfusion as needed.  Interim History: [name] [name] is here for return evaluation.  Since the last visit, he reports no major changes in his health.  He continues to tolerate Xtandi without any complaints.  He is requiring periodic transfusion with the last packed red cells given in November 2020.  He does not report any shortness of breath or chest pain but does report increased fatigue.  He still ambulates with the help of a cane without any falls or syncope.  His performance status remains reasonable and continues to tolerate hemodialysis.   Patient denied any alteration mental status, neuropathy, confusion or dizziness.  Denies any headaches or lethargy.  Denies any night sweats, weight loss or changes in appetite.  Denied orthopnea, dyspnea on exertion or chest discomfort.  Denies shortness of breath, difficulty breathing hemoptysis or cough.  Denies any abdominal distention, nausea, early satiety or dyspepsia.  Denies any hematuria, frequency, dysuria or nocturia.  Denies any skin irritation, dryness or rash.  Denies any ecchymosis or petechiae.  Denies any lymphadenopathy or clotting.  Denies any heat or cold intolerance.  Denies any anxiety or depression.  Remaining review of system is  negative.               .    Medications: Updated without any changes. Current Outpatient Medications  Medication Sig Dispense Refill  . amLODipine (NORVASC) 5 MG tablet TAKE 1 TABLET BY MOUTH DAILY 90 tablet 3  . aspirin EC 81 MG tablet Take 1 tablet (81 mg total) by mouth daily. (Patient taking differently: Take 81 mg by mouth every evening. ) 90 tablet 3  . atorvastatin (LIPITOR) 20 MG tablet Take 1 tablet (20 mg total) by mouth every evening. 90 tablet 3  . B Complex-C-Folic Acid (DIALYVITE 081) 0.8 MG TABS Take 0.8 mg by mouth every evening.   3  . cinacalcet (SENSIPAR) 60 MG tablet Take 60 mg by mouth every evening.     . latanoprost (XALATAN) 0.005 % ophthalmic solution Place 1 drop into both eyes at bedtime.    Marland Kitchen leuprolide (LUPRON) 30 MG injection Inject 30 mg into the muscle every 4 (four) months.    . pantoprazole (PROTONIX) 40 MG tablet Take 1 tablet (40 mg total) by mouth daily. (Patient taking differently: Take 40 mg by mouth every evening. ) 30 tablet 0  . XTANDI 40 MG capsule TAKE 4 CAPSULES (160 MG TOTAL) BY MOUTH DAILY. 120 capsule 0   No current facility-administered medications for this visit.     Allergies: No Known Allergies  Past Medical History, Surgical history, Social history, and Family History unchanged on review.  Physical Exam:  Blood pressure (!) 116/41, pulse 66, temperature 98 F (36.7 C), temperature source Temporal, resp. rate 18, weight 165 lb  12.8 oz (75.2 kg), SpO2 100 %.   ECOG: 1    General appearance: Comfortable appearing without any discomfort Head: Normocephalic without any trauma Oropharynx: Mucous membranes are moist and pink without any thrush or ulcers. Eyes: Pupils are equal and round reactive to light. Lymph nodes: No cervical, supraclavicular, inguinal or axillary lymphadenopathy.   Heart:regular rate and rhythm.  S1 and S2 without leg edema. Lung: Clear without any rhonchi or wheezes.  No dullness to  percussion. Abdomin: Soft, nontender, nondistended with good bowel sounds.  No hepatosplenomegaly. Musculoskeletal: No joint deformity or effusion.  Full range of motion noted. Neurological: No deficits noted on motor, sensory and deep tendon reflex exam. Skin: No petechial rash or dryness.  Appeared moist.              Lab Results: Lab Results  Component Value Date   WBC 4.3 01/01/2019   HGB 8.3 (L) 01/01/2019   HCT 27.2 (L) 01/01/2019   MCV 88.9 01/01/2019   PLT 324 01/01/2019     Chemistry      Component Value Date/Time   NA 141 12/11/2018 0802   NA 143 09/29/2016 0810   K 4.7 12/11/2018 0802   K 4.3 09/29/2016 0810   CL 98 12/11/2018 0802   CO2 27 12/11/2018 0802   CO2 31 (H) 09/29/2016 0810   BUN 37 (H) 12/11/2018 0802   BUN 27.9 (H) 09/29/2016 0810   CREATININE 4.97 (HH) 12/11/2018 0802   CREATININE 7.4 (HH) 09/29/2016 0810      Component Value Date/Time   CALCIUM 9.5 12/11/2018 0802   CALCIUM 9.9 09/29/2016 0810   ALKPHOS 107 12/11/2018 0802   ALKPHOS 141 09/29/2016 0810   AST 19 12/11/2018 0802   AST 21 09/29/2016 0810   ALT 9 12/11/2018 0802   ALT 10 09/29/2016 0810   BILITOT 0.5 12/11/2018 0802   BILITOT 0.48 09/29/2016 0810         Results for [name] [name] (MRN [id number]) as of 01/01/2019 09:01  Ref. Range 11/20/2018 08:03 12/11/2018 08:02  Prostate Specific Ag, Serum Latest Ref Range: 0.0 - 4.0 ng/mL 0.1 0.1            Impression and Plan:   85 year old man with:  1.  Advanced prostate cancer with a pelvic adenopathy diagnosed in 2017.  He has castration-resistant disease at this time.   He remains on Xtandi at this time with reasonable PSA response is currently at 0.1.  The natural course of this disease was reviewed and alternative treatment options in the future were reiterated.  At this time we will continue Xtandi and reserve any salvage therapy is needed.  His options would include systemic chemotherapy, Xofigo or  switching to [name] [name].  Complication related to these agents including nausea, vomiting, myelosuppression and neutropenia.  After discussion today he is agreeable to continue with Xtandi.  2. Androgen deprivation therapy: I recommended continuing this indefinitely.  He is currently receiving it under the care of Dr. Diona [name].   3.  Anemia: Multifactorial in nature related to malignancy, renal disease with possible MDS.  He is requiring packed red cell transfusion periodically.  Hemoglobin is 8.3 and mildly symptomatic.  In anticipation of him dropping further we will transfuse 1 unit of packed red cells at this time.  4.  Prognosis and goals of care: Therapy remains palliative at this time with disease incurable.  Aggressive measures are warranted at this time given his reasonable performance status.  5.  Renal failure: Continues  to be on dialysis without any issues.  6. Follow-up: We will continue to have repeat evaluation every 3 weeks for possible transfusion. MD follow-up in 9 weeks.  25  minutes was spent with the patient face-to-face today.  More than 50% of time was dedicated to reviewing laboratory data, treatment options and complications related to other therapies.  Zola Button, MD 12/23/20209:00 AM

## 2019-01-02 ENCOUNTER — Telehealth: Payer: Self-pay | Admitting: Oncology

## 2019-01-02 LAB — TYPE AND SCREEN
ABO/RH(D): O POS
Antibody Screen: NEGATIVE
Unit division: 0

## 2019-01-02 LAB — BPAM RBC
Blood Product Expiration Date: 202101262359
ISSUE DATE / TIME: 202012231101
Unit Type and Rh: 5100

## 2019-01-02 NOTE — Telephone Encounter (Signed)
Scheduled appt per 12/23 los.  Spoke with pt spouse and she is aware of the appt dates and time.

## 2019-01-06 MED FILL — XTANDI 40 MG CAPSULE: 40 | 30 days supply | Qty: 120 | Fill #0

## 2019-01-16 ENCOUNTER — Telehealth: Payer: Self-pay | Admitting: Pharmacy Technician

## 2019-01-16 NOTE — Telephone Encounter (Signed)
Oral Oncology Patient Advocate Encounter  Patient has been approved for copay assistance with [employer] (TAF).  [employer] [place] [place] will cover all copayment expenses for Xtandi for the remainder of the calendar year.    The billing information is as follows and has been shared with [place].   Member ID: [id number] Group ID: [id number] PCN: [id number] BIN: [id number] Eligibility Dates: 01/10/19 to 01/09/20  Fund: [place] [facility] [place] Phone [phone] Fax [phone] 01/16/2019 9:05 AM

## 2019-01-22 ENCOUNTER — Inpatient Hospital Stay: Payer: Medicare Other | Attending: Oncology

## 2019-01-22 ENCOUNTER — Inpatient Hospital Stay: Payer: Medicare Other

## 2019-01-22 ENCOUNTER — Other Ambulatory Visit: Payer: Self-pay

## 2019-01-22 DIAGNOSIS — D649 Anemia, unspecified: Secondary | ICD-10-CM

## 2019-01-22 DIAGNOSIS — C61 Malignant neoplasm of prostate: Secondary | ICD-10-CM | POA: Diagnosis not present

## 2019-01-22 DIAGNOSIS — D630 Anemia in neoplastic disease: Secondary | ICD-10-CM | POA: Diagnosis not present

## 2019-01-22 LAB — CMP (CANCER CENTER ONLY)
ALT: 7 U/L (ref 0–44)
AST: 17 U/L (ref 15–41)
Albumin: 3 g/dL — ABNORMAL LOW (ref 3.5–5.0)
Alkaline Phosphatase: 89 U/L (ref 38–126)
Anion gap: 15 (ref 5–15)
BUN: 54 mg/dL — ABNORMAL HIGH (ref 8–23)
CO2: 28 mmol/L (ref 22–32)
Calcium: 8.9 mg/dL (ref 8.9–10.3)
Chloride: 98 mmol/L (ref 98–111)
Creatinine: 5.33 mg/dL (ref 0.61–1.24)
GFR, Est AFR Am: 10 mL/min — ABNORMAL LOW (ref >60)
GFR, Estimated: 9 mL/min — ABNORMAL LOW (ref >60)
Glucose, Bld: 103 mg/dL — ABNORMAL HIGH (ref 70–99)
Potassium: 4.6 mmol/L (ref 3.5–5.1)
Sodium: 141 mmol/L (ref 135–145)
Total Bilirubin: 0.4 mg/dL (ref 0.3–1.2)
Total Protein: 7.1 g/dL (ref 6.5–8.1)

## 2019-01-22 LAB — CBC WITH DIFFERENTIAL (CANCER CENTER ONLY)
Abs Immature Granulocytes: 0.01 10*3/uL (ref 0.00–0.07)
Basophils Absolute: 0 10*3/uL (ref 0.0–0.1)
Basophils Relative: 0 %
Eosinophils Absolute: 0 10*3/uL (ref 0.0–0.5)
Eosinophils Relative: 1 %
HCT: 24.5 % — ABNORMAL LOW (ref 39.0–52.0)
Hemoglobin: 7.4 g/dL — ABNORMAL LOW (ref 13.0–17.0)
Immature Granulocytes: 0 %
Lymphocytes Relative: 19 %
Lymphs Abs: 1.1 10*3/uL (ref 0.7–4.0)
MCH: 25.5 pg — ABNORMAL LOW (ref 26.0–34.0)
MCHC: 30.2 g/dL (ref 30.0–36.0)
MCV: 84.5 fL (ref 80.0–100.0)
Monocytes Absolute: 0.5 10*3/uL (ref 0.1–1.0)
Monocytes Relative: 8 %
Neutro Abs: 4 10*3/uL (ref 1.7–7.7)
Neutrophils Relative %: 72 %
Platelet Count: 313 10*3/uL (ref 150–400)
RBC: 2.9 MIL/uL — ABNORMAL LOW (ref 4.22–5.81)
RDW: 17.1 % — ABNORMAL HIGH (ref 11.5–15.5)
WBC Count: 5.6 10*3/uL (ref 4.0–10.5)
nRBC: 0 % (ref 0.0–0.2)

## 2019-01-22 LAB — PREPARE RBC (CROSSMATCH)

## 2019-01-22 LAB — SAMPLE TO BLOOD BANK

## 2019-01-22 MED ORDER — ACETAMINOPHEN 325 MG PO TABS
ORAL_TABLET | ORAL | Status: AC
  Filled 2019-01-22: qty 2

## 2019-01-22 MED ORDER — DIPHENHYDRAMINE HCL 25 MG PO CAPS
25.0000 mg | ORAL_CAPSULE | Freq: Once | ORAL | Status: AC
  Administered 2019-01-22: 25 mg via ORAL

## 2019-01-22 MED ORDER — SODIUM CHLORIDE 0.9% IV SOLUTION
250.0000 mL | Freq: Once | INTRAVENOUS | Status: AC
  Administered 2019-01-22: 250 mL via INTRAVENOUS
  Filled 2019-01-22: qty 250

## 2019-01-22 MED ORDER — ACETAMINOPHEN 325 MG PO TABS
650.0000 mg | ORAL_TABLET | Freq: Once | ORAL | Status: AC
  Administered 2019-01-22: 650 mg via ORAL

## 2019-01-22 MED ORDER — DIPHENHYDRAMINE HCL 25 MG PO CAPS
ORAL_CAPSULE | ORAL | Status: AC
  Filled 2019-01-22: qty 1

## 2019-01-22 NOTE — Patient Instructions (Signed)

## 2019-01-23 LAB — BPAM RBC
Blood Product Expiration Date: 202102102359
Blood Product Expiration Date: 202102102359
ISSUE DATE / TIME: 202101131016
ISSUE DATE / TIME: 202101131016
Unit Type and Rh: 5100
Unit Type and Rh: 5100

## 2019-01-23 LAB — TYPE AND SCREEN
ABO/RH(D): O POS
Antibody Screen: NEGATIVE
Unit division: 0
Unit division: 0

## 2019-01-23 LAB — PROSTATE-SPECIFIC AG, SERUM (LABCORP): Prostate Specific Ag, Serum: 0.1 ng/mL (ref 0.0–4.0)

## 2019-01-30 ENCOUNTER — Other Ambulatory Visit: Payer: Self-pay | Admitting: Oncology

## 2019-01-30 DIAGNOSIS — C61 Malignant neoplasm of prostate: Secondary | ICD-10-CM

## 2019-02-05 MED FILL — XTANDI 40 MG CAPSULE: 40 | 30 days supply | Qty: 120 | Fill #0

## 2019-02-12 ENCOUNTER — Inpatient Hospital Stay: Payer: Medicare Other

## 2019-02-12 ENCOUNTER — Other Ambulatory Visit: Payer: Self-pay

## 2019-02-12 ENCOUNTER — Inpatient Hospital Stay: Payer: Medicare Other | Attending: Oncology

## 2019-02-12 ENCOUNTER — Telehealth: Payer: Self-pay

## 2019-02-12 DIAGNOSIS — D630 Anemia in neoplastic disease: Secondary | ICD-10-CM | POA: Insufficient documentation

## 2019-02-12 DIAGNOSIS — C61 Malignant neoplasm of prostate: Secondary | ICD-10-CM | POA: Diagnosis not present

## 2019-02-12 DIAGNOSIS — D649 Anemia, unspecified: Secondary | ICD-10-CM

## 2019-02-12 LAB — CBC WITH DIFFERENTIAL (CANCER CENTER ONLY)
Abs Immature Granulocytes: 0.02 10*3/uL (ref 0.00–0.07)
Basophils Absolute: 0 10*3/uL (ref 0.0–0.1)
Basophils Relative: 0 %
Eosinophils Absolute: 0.1 10*3/uL (ref 0.0–0.5)
Eosinophils Relative: 1 %
HCT: 27.9 % — ABNORMAL LOW (ref 39.0–52.0)
Hemoglobin: 8.3 g/dL — ABNORMAL LOW (ref 13.0–17.0)
Immature Granulocytes: 0 %
Lymphocytes Relative: 22 %
Lymphs Abs: 1.1 10*3/uL (ref 0.7–4.0)
MCH: 25.2 pg — ABNORMAL LOW (ref 26.0–34.0)
MCHC: 29.7 g/dL — ABNORMAL LOW (ref 30.0–36.0)
MCV: 84.8 fL (ref 80.0–100.0)
Monocytes Absolute: 0.5 10*3/uL (ref 0.1–1.0)
Monocytes Relative: 9 %
Neutro Abs: 3.5 10*3/uL (ref 1.7–7.7)
Neutrophils Relative %: 68 %
Platelet Count: 395 10*3/uL (ref 150–400)
RBC: 3.29 MIL/uL — ABNORMAL LOW (ref 4.22–5.81)
RDW: 17.3 % — ABNORMAL HIGH (ref 11.5–15.5)
WBC Count: 5.2 10*3/uL (ref 4.0–10.5)
nRBC: 0 % (ref 0.0–0.2)

## 2019-02-12 LAB — CMP (CANCER CENTER ONLY)
ALT: 7 U/L (ref 0–44)
AST: 18 U/L (ref 15–41)
Albumin: 2.7 g/dL — ABNORMAL LOW (ref 3.5–5.0)
Alkaline Phosphatase: 93 U/L (ref 38–126)
Anion gap: 12 (ref 5–15)
BUN: 36 mg/dL — ABNORMAL HIGH (ref 8–23)
CO2: 30 mmol/L (ref 22–32)
Calcium: 9.2 mg/dL (ref 8.9–10.3)
Chloride: 99 mmol/L (ref 98–111)
Creatinine: 4.19 mg/dL (ref 0.61–1.24)
GFR, Est AFR Am: 14 mL/min — ABNORMAL LOW (ref >60)
GFR, Estimated: 12 mL/min — ABNORMAL LOW (ref >60)
Glucose, Bld: 88 mg/dL (ref 70–99)
Potassium: 3.8 mmol/L (ref 3.5–5.1)
Sodium: 141 mmol/L (ref 135–145)
Total Bilirubin: 0.3 mg/dL (ref 0.3–1.2)
Total Protein: 7.3 g/dL (ref 6.5–8.1)

## 2019-02-12 LAB — PREPARE RBC (CROSSMATCH)

## 2019-02-12 LAB — SAMPLE TO BLOOD BANK

## 2019-02-12 MED ORDER — ACETAMINOPHEN 325 MG PO TABS
650.0000 mg | ORAL_TABLET | Freq: Once | ORAL | Status: AC
  Administered 2019-02-12: 650 mg via ORAL

## 2019-02-12 MED ORDER — DIPHENHYDRAMINE HCL 25 MG PO CAPS
25.0000 mg | ORAL_CAPSULE | Freq: Once | ORAL | Status: AC
  Administered 2019-02-12: 25 mg via ORAL

## 2019-02-12 MED ORDER — SODIUM CHLORIDE 0.9% IV SOLUTION
250.0000 mL | Freq: Once | INTRAVENOUS | Status: AC
  Administered 2019-02-12: 250 mL via INTRAVENOUS
  Filled 2019-02-12: qty 250

## 2019-02-12 MED ORDER — DIPHENHYDRAMINE HCL 25 MG PO CAPS
ORAL_CAPSULE | ORAL | Status: AC
  Filled 2019-02-12: qty 1

## 2019-02-12 MED ORDER — ACETAMINOPHEN 325 MG PO TABS
ORAL_TABLET | ORAL | Status: AC
  Filled 2019-02-12: qty 2

## 2019-02-12 NOTE — Telephone Encounter (Signed)
Lab called with Scr 4.19. Dr. Alen Blew made aware, no new orders at this time.

## 2019-02-12 NOTE — Progress Notes (Signed)
Patient with Hgb 8.3.  Per Dr. Alen Blew, patient to receive 1 unit PRBCs. Orders entered.

## 2019-02-12 NOTE — Patient Instructions (Signed)

## 2019-02-13 LAB — BPAM RBC
Blood Product Expiration Date: 202102242359
ISSUE DATE / TIME: 202102031111
Unit Type and Rh: 5100

## 2019-02-13 LAB — TYPE AND SCREEN
ABO/RH(D): O POS
Antibody Screen: NEGATIVE
Unit division: 0

## 2019-02-13 LAB — PROSTATE-SPECIFIC AG, SERUM (LABCORP): Prostate Specific Ag, Serum: 0.2 ng/mL (ref 0.0–4.0)

## 2019-02-27 ENCOUNTER — Telehealth: Payer: Self-pay

## 2019-02-27 NOTE — Telephone Encounter (Signed)
-----   Message from Wyatt Portela, MD sent at 02/27/2019 12:44 PM EST ----- We can move his lab and transfusion to February 22 if available.  Keep MD as is on 2/24.  Thanks. ----- Message ----- From: Teodoro Spray, RN Sent: 02/27/2019  12:37 PM EST To: Wyatt Portela, MD  Received call from [facility] in regards to patient.  Patient's hemoglobin was check yesterday and it is 6.4. Patient is currently scheduled with [place] for lab, MD, and possible transfusion on 03/05/19.  [facility] wants to know if we are able to move appointment to an earlier date and time.  Infusion Room may be able to move him to Monday 03/03/19.  Please advise.

## 2019-02-27 NOTE — Telephone Encounter (Signed)
Received call from Eudelia Bunch from [facility] regarding patient's Hgb and upcoming appointments. Scheduling message sent to reschedule patient's lab and infusion appointment to 03/03/19. Patient informed of date and time of changed lab and infusion appointments.  Patient instructed to call office with any questions or concerns.

## 2019-02-28 ENCOUNTER — Other Ambulatory Visit: Payer: Self-pay | Admitting: Oncology

## 2019-02-28 DIAGNOSIS — C61 Malignant neoplasm of prostate: Secondary | ICD-10-CM

## 2019-03-03 ENCOUNTER — Other Ambulatory Visit: Payer: Self-pay

## 2019-03-03 ENCOUNTER — Inpatient Hospital Stay: Payer: Medicare Other

## 2019-03-03 DIAGNOSIS — D649 Anemia, unspecified: Secondary | ICD-10-CM

## 2019-03-03 DIAGNOSIS — C61 Malignant neoplasm of prostate: Secondary | ICD-10-CM

## 2019-03-03 LAB — CBC WITH DIFFERENTIAL (CANCER CENTER ONLY)
Abs Immature Granulocytes: 0.02 10*3/uL (ref 0.00–0.07)
Basophils Absolute: 0 10*3/uL (ref 0.0–0.1)
Basophils Relative: 0 %
Eosinophils Absolute: 0.1 10*3/uL (ref 0.0–0.5)
Eosinophils Relative: 2 %
HCT: 21.3 % — ABNORMAL LOW (ref 39.0–52.0)
Hemoglobin: 6.5 g/dL — CL (ref 13.0–17.0)
Immature Granulocytes: 0 %
Lymphocytes Relative: 18 %
Lymphs Abs: 0.9 10*3/uL (ref 0.7–4.0)
MCH: 28 pg (ref 26.0–34.0)
MCHC: 30.5 g/dL (ref 30.0–36.0)
MCV: 91.8 fL (ref 80.0–100.0)
Monocytes Absolute: 0.5 10*3/uL (ref 0.1–1.0)
Monocytes Relative: 10 %
Neutro Abs: 3.5 10*3/uL (ref 1.7–7.7)
Neutrophils Relative %: 70 %
Platelet Count: 281 10*3/uL (ref 150–400)
RBC: 2.32 MIL/uL — ABNORMAL LOW (ref 4.22–5.81)
RDW: 26.2 % — ABNORMAL HIGH (ref 11.5–15.5)
WBC Count: 5 10*3/uL (ref 4.0–10.5)
nRBC: 0 % (ref 0.0–0.2)

## 2019-03-03 LAB — PREPARE RBC (CROSSMATCH)

## 2019-03-03 LAB — SAMPLE TO BLOOD BANK

## 2019-03-03 MED ORDER — ACETAMINOPHEN 325 MG PO TABS
650.0000 mg | ORAL_TABLET | Freq: Once | ORAL | Status: AC
  Administered 2019-03-03: 09:00:00 650 mg via ORAL

## 2019-03-03 MED ORDER — DIPHENHYDRAMINE HCL 25 MG PO CAPS
25.0000 mg | ORAL_CAPSULE | Freq: Once | ORAL | Status: AC
  Administered 2019-03-03: 25 mg via ORAL

## 2019-03-03 MED ORDER — HEPARIN SOD (PORK) LOCK FLUSH 100 UNIT/ML IV SOLN
500.0000 [IU] | Freq: Every day | INTRAVENOUS | Status: DC | PRN
  Filled 2019-03-03: qty 5

## 2019-03-03 MED ORDER — DIPHENHYDRAMINE HCL 25 MG PO CAPS
ORAL_CAPSULE | ORAL | Status: AC
  Filled 2019-03-03: qty 1

## 2019-03-03 MED ORDER — SODIUM CHLORIDE 0.9% FLUSH
10.0000 mL | INTRAVENOUS | Status: DC | PRN
  Filled 2019-03-03: qty 10

## 2019-03-03 MED ORDER — ACETAMINOPHEN 325 MG PO TABS
ORAL_TABLET | ORAL | Status: AC
  Filled 2019-03-03: qty 2

## 2019-03-03 MED ORDER — SODIUM CHLORIDE 0.9% IV SOLUTION
250.0000 mL | Freq: Once | INTRAVENOUS | Status: AC
  Administered 2019-03-03: 250 mL via INTRAVENOUS
  Filled 2019-03-03: qty 250

## 2019-03-03 NOTE — Patient Instructions (Signed)

## 2019-03-03 NOTE — Progress Notes (Signed)
Received a call from [facility] in the lab reporting patient's hemoglobin is 6.5. Dr. Alen Blew made aware and orders placed for 2 units of blood today. Infusion RN made aware. Spoke to [place] in blood bank to confirm orders were received.

## 2019-03-04 LAB — BPAM RBC
Blood Product Expiration Date: 202103212359
Blood Product Expiration Date: 202103222359
ISSUE DATE / TIME: 202102220931
ISSUE DATE / TIME: 202102220931
Unit Type and Rh: 5100
Unit Type and Rh: 5100

## 2019-03-04 LAB — TYPE AND SCREEN
ABO/RH(D): O POS
Antibody Screen: NEGATIVE
Unit division: 0
Unit division: 0

## 2019-03-05 ENCOUNTER — Other Ambulatory Visit: Payer: Self-pay

## 2019-03-05 ENCOUNTER — Inpatient Hospital Stay (HOSPITAL_BASED_OUTPATIENT_CLINIC_OR_DEPARTMENT_OTHER): Payer: Medicare Other | Admitting: Oncology

## 2019-03-05 ENCOUNTER — Telehealth: Payer: Self-pay

## 2019-03-05 ENCOUNTER — Inpatient Hospital Stay: Payer: Medicare Other

## 2019-03-05 VITALS — BP 119/34 | HR 66 | Temp 98.5°F | Resp 18 | Ht 72.0 in | Wt 164.1 lb

## 2019-03-05 DIAGNOSIS — D649 Anemia, unspecified: Secondary | ICD-10-CM | POA: Diagnosis not present

## 2019-03-05 DIAGNOSIS — C61 Malignant neoplasm of prostate: Secondary | ICD-10-CM

## 2019-03-05 LAB — CMP (CANCER CENTER ONLY)
ALT: 6 U/L (ref 0–44)
AST: 16 U/L (ref 15–41)
Albumin: 2.6 g/dL — ABNORMAL LOW (ref 3.5–5.0)
Alkaline Phosphatase: 88 U/L (ref 38–126)
Anion gap: 11 (ref 5–15)
BUN: 43 mg/dL — ABNORMAL HIGH (ref 8–23)
CO2: 31 mmol/L (ref 22–32)
Calcium: 9.3 mg/dL (ref 8.9–10.3)
Chloride: 101 mmol/L (ref 98–111)
Creatinine: 4.25 mg/dL (ref 0.61–1.24)
GFR, Est AFR Am: 14 mL/min — ABNORMAL LOW (ref >60)
GFR, Estimated: 12 mL/min — ABNORMAL LOW (ref >60)
Glucose, Bld: 78 mg/dL (ref 70–99)
Potassium: 4.3 mmol/L (ref 3.5–5.1)
Sodium: 143 mmol/L (ref 135–145)
Total Bilirubin: 0.4 mg/dL (ref 0.3–1.2)
Total Protein: 6.7 g/dL (ref 6.5–8.1)

## 2019-03-05 LAB — CBC WITH DIFFERENTIAL (CANCER CENTER ONLY)
Abs Immature Granulocytes: 0.01 10*3/uL (ref 0.00–0.07)
Basophils Absolute: 0 10*3/uL (ref 0.0–0.1)
Basophils Relative: 1 %
Eosinophils Absolute: 0.1 10*3/uL (ref 0.0–0.5)
Eosinophils Relative: 2 %
HCT: 26.5 % — ABNORMAL LOW (ref 39.0–52.0)
Hemoglobin: 8.3 g/dL — ABNORMAL LOW (ref 13.0–17.0)
Immature Granulocytes: 0 %
Lymphocytes Relative: 20 %
Lymphs Abs: 1 10*3/uL (ref 0.7–4.0)
MCH: 29.1 pg (ref 26.0–34.0)
MCHC: 31.3 g/dL (ref 30.0–36.0)
MCV: 93 fL (ref 80.0–100.0)
Monocytes Absolute: 0.7 10*3/uL (ref 0.1–1.0)
Monocytes Relative: 13 %
Neutro Abs: 3.3 10*3/uL (ref 1.7–7.7)
Neutrophils Relative %: 64 %
Platelet Count: 248 10*3/uL (ref 150–400)
RBC: 2.85 MIL/uL — ABNORMAL LOW (ref 4.22–5.81)
RDW: 23.6 % — ABNORMAL HIGH (ref 11.5–15.5)
WBC Count: 5.1 10*3/uL (ref 4.0–10.5)
nRBC: 0 % (ref 0.0–0.2)

## 2019-03-05 LAB — SAMPLE TO BLOOD BANK

## 2019-03-05 NOTE — Progress Notes (Signed)
Hematology and Oncology Follow Up Visit  [name] [name] [id number] 04-18-1930 86 y.o. 03/05/2019 8:47 AM [name] [name], MDElkins, [name] [name], *   Principle Diagnosis: 86 year old man with:  1.  Castration-resistant prostate cancer with pelvic adenopathy diagnosed in 2017.  He was found to have Gleason score 4+4 = 8 PSA of 369 at that time.  2.  Multifactorial anemia related to chronic renal insufficiency, chronic blood losses or early myelodysplasia.  Prior Therapy: Androgen deprivation therapy and he remains on it under the care of Dr. Diona Fanti. His PSA decreased to 31 in 2017. His PSA in November 2017 was 41 and in March 2018 was 29.    Current therapy:   Xtandi 160 mg daily started in March 2019.  Recommend cell transfusion periodically to keep his hemoglobin above 8.  Interim History: [name] returns today for a follow-up.  Since the last visit, he reports no major changes in his health.  Continues to tolerate Xtandi without any major complaints.  He does report status of fatigue and tiredness associated with his anemia but improved after packed red cell transfusion.  His hemoglobin is adequate today after recent transfusion.  Denies any chest pain or difficulty breathing.  Still ambulates with help of a cane and continues to receive dialysis.               .    Medications: Unchanged on review. Current Outpatient Medications  Medication Sig Dispense Refill  . amLODipine (NORVASC) 5 MG tablet TAKE 1 TABLET BY MOUTH DAILY 90 tablet 3  . aspirin EC 81 MG tablet Take 1 tablet (81 mg total) by mouth daily. (Patient taking differently: Take 81 mg by mouth every evening. ) 90 tablet 3  . atorvastatin (LIPITOR) 20 MG tablet Take 1 tablet (20 mg total) by mouth every evening. 90 tablet 3  . B Complex-C-Folic Acid (DIALYVITE 932) 0.8 MG TABS Take 0.8 mg by mouth every evening.   3  . cinacalcet (SENSIPAR) 60 MG tablet Take 60 mg by mouth every evening.     Marland Kitchen  doxercalciferol (HECTOROL) 0.5 MCG capsule Doxercalciferol (Hectorol)    . latanoprost (XALATAN) 0.005 % ophthalmic solution Place 1 drop into both eyes at bedtime.    Marland Kitchen leuprolide (LUPRON) 30 MG injection Inject 30 mg into the muscle every 4 (four) months.    . pantoprazole (PROTONIX) 40 MG tablet Take 1 tablet (40 mg total) by mouth daily. (Patient taking differently: Take 40 mg by mouth every evening. ) 30 tablet 0  . XTANDI 40 MG capsule TAKE 4 CAPSULES (160 MG TOTAL) BY MOUTH DAILY. 120 capsule 0   No current facility-administered medications for this visit.     Allergies: No Known Allergies    Physical Exam:  Blood pressure (!) 119/34, pulse 66, temperature 98.5 F (36.9 C), resp. rate 18, height 6' (1.829 m), weight 164 lb 1.6 oz (74.4 kg), SpO2 100 %.   ECOG: 1   General appearance: Alert, awake without any distress. Head: Atraumatic without abnormalities Oropharynx: Without any thrush or ulcers. Eyes: No scleral icterus. Lymph nodes: No lymphadenopathy noted in the cervical, supraclavicular, or axillary nodes Heart:regular rate and rhythm, without any murmurs or gallops.   Lung: Clear to auscultation without any rhonchi, wheezes or dullness to percussion. Abdomin: Soft, nontender without any shifting dullness or ascites. Musculoskeletal: No clubbing or cyanosis. Neurological: No motor or sensory deficits. Skin: No rashes or lesions.  Lab Results: Lab Results  Component Value Date   WBC 5.1 03/05/2019   HGB 8.3 (L) 03/05/2019   HCT 26.5 (L) 03/05/2019   MCV 93.0 03/05/2019   PLT 248 03/05/2019     Chemistry      Component Value Date/Time   NA 141 02/12/2019 0922   NA 143 09/29/2016 0810   K 3.8 02/12/2019 0922   K 4.3 09/29/2016 0810   CL 99 02/12/2019 0922   CO2 30 02/12/2019 0922   CO2 31 (H) 09/29/2016 0810   BUN 36 (H) 02/12/2019 0922   BUN 27.9 (H) 09/29/2016 0810   CREATININE 4.19 (HH) 02/12/2019 0922   CREATININE 7.4 (HH)  09/29/2016 0810      Component Value Date/Time   CALCIUM 9.2 02/12/2019 0922   CALCIUM 9.9 09/29/2016 0810   ALKPHOS 93 02/12/2019 0922   ALKPHOS 141 09/29/2016 0810   AST 18 02/12/2019 0922   AST 21 09/29/2016 0810   ALT 7 02/12/2019 0922   ALT 10 09/29/2016 0810   BILITOT 0.3 02/12/2019 0922   BILITOT 0.48 09/29/2016 0810          Results for [name] [name], [name] [name] (MRN [id number]) as of 03/05/2019 08:50  Ref. Range 01/22/2019 08:21 02/12/2019 09:22  Prostate Specific Ag, Serum Latest Ref Range: 0.0 - 4.0 ng/mL 0.1 0.2            Impression and Plan:   86 year old man with:  1.  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     He has tolerated Xtandi without any major complaints at this time.  His PSA is showing slight increase very slowly at this time with overall stable clinical status.  Risks and benefits of continuing Xtandi versus different salvage therapy options were reviewed.  These options include systemic chemotherapy or switching to Zytiga would be considered if he has rapid rise in his PSA or progression of disease radiographically.  He is agreeable to continue with Xtandi at this time.  2. Androgen deprivation therapy: He is receiving it under the care of Dr. Diona Fanti which I recommended continuing.   3.  Anemia: Related to chronic blood losses from possible GI sources, anemia of renal disease, prostate cancer or possibly myelodysplasia.  We will continue with supportive transfusion at this time.  He is receiving growth factor support with dialysis.  4.  Prognosis and goals of care: His disease is incurable and any therapy is palliative at this time.  His performance status is reasonable and aggressive measures are warranted.  5.  Renal failure: He remains on dialysis without any recent complications.  6. Follow-up: Every 2 weeks for packed red cell transfusion and MD follow-up in 8 weeks.   30  minutes were dedicated to this  encounter.  The time was spent on reviewing laboratory data, disease status update, treatment options and organizing future plan of care.  Zola Button, MD 2/24/20218:47 AM

## 2019-03-05 NOTE — Telephone Encounter (Signed)
Lab called critical creatinine of 4.25. Dr. Alen Blew made aware. No further orders received.

## 2019-03-06 ENCOUNTER — Telehealth: Payer: Self-pay | Admitting: Oncology

## 2019-03-06 MED FILL — XTANDI 40 MG CAPSULE: 40 | 30 days supply | Qty: 120 | Fill #0

## 2019-03-06 NOTE — Telephone Encounter (Signed)
Scheduled appt per 2/24 los.  Left a vm of the appt date and time.

## 2019-03-19 ENCOUNTER — Other Ambulatory Visit: Payer: Self-pay

## 2019-03-19 ENCOUNTER — Inpatient Hospital Stay: Payer: Medicare Other

## 2019-03-19 ENCOUNTER — Inpatient Hospital Stay: Payer: Medicare Other | Attending: Oncology

## 2019-03-19 ENCOUNTER — Telehealth: Payer: Self-pay

## 2019-03-19 DIAGNOSIS — D649 Anemia, unspecified: Secondary | ICD-10-CM | POA: Diagnosis not present

## 2019-03-19 DIAGNOSIS — C61 Malignant neoplasm of prostate: Secondary | ICD-10-CM

## 2019-03-19 LAB — CMP (CANCER CENTER ONLY)
ALT: 6 U/L (ref 0–44)
AST: 18 U/L (ref 15–41)
Albumin: 2.7 g/dL — ABNORMAL LOW (ref 3.5–5.0)
Alkaline Phosphatase: 86 U/L (ref 38–126)
Anion gap: 13 (ref 5–15)
BUN: 42 mg/dL — ABNORMAL HIGH (ref 8–23)
CO2: 30 mmol/L (ref 22–32)
Calcium: 9.5 mg/dL (ref 8.9–10.3)
Chloride: 98 mmol/L (ref 98–111)
Creatinine: 4.27 mg/dL (ref 0.61–1.24)
GFR, Est AFR Am: 14 mL/min — ABNORMAL LOW
GFR, Estimated: 12 mL/min — ABNORMAL LOW
Glucose, Bld: 80 mg/dL (ref 70–99)
Potassium: 4.6 mmol/L (ref 3.5–5.1)
Sodium: 141 mmol/L (ref 135–145)
Total Bilirubin: 0.5 mg/dL (ref 0.3–1.2)
Total Protein: 6.9 g/dL (ref 6.5–8.1)

## 2019-03-19 LAB — PREPARE RBC (CROSSMATCH)

## 2019-03-19 LAB — CBC WITH DIFFERENTIAL (CANCER CENTER ONLY)
Abs Immature Granulocytes: 0.01 10*3/uL (ref 0.00–0.07)
Basophils Absolute: 0 10*3/uL (ref 0.0–0.1)
Basophils Relative: 1 %
Eosinophils Absolute: 0.1 10*3/uL (ref 0.0–0.5)
Eosinophils Relative: 3 %
HCT: 27.4 % — ABNORMAL LOW (ref 39.0–52.0)
Hemoglobin: 8.5 g/dL — ABNORMAL LOW (ref 13.0–17.0)
Immature Granulocytes: 0 %
Lymphocytes Relative: 21 %
Lymphs Abs: 1.1 10*3/uL (ref 0.7–4.0)
MCH: 29.5 pg (ref 26.0–34.0)
MCHC: 31 g/dL (ref 30.0–36.0)
MCV: 95.1 fL (ref 80.0–100.0)
Monocytes Absolute: 0.6 10*3/uL (ref 0.1–1.0)
Monocytes Relative: 11 %
Neutro Abs: 3.4 10*3/uL (ref 1.7–7.7)
Neutrophils Relative %: 64 %
Platelet Count: 329 10*3/uL (ref 150–400)
RBC: 2.88 MIL/uL — ABNORMAL LOW (ref 4.22–5.81)
RDW: 21.4 % — ABNORMAL HIGH (ref 11.5–15.5)
WBC Count: 5.2 10*3/uL (ref 4.0–10.5)
nRBC: 0 % (ref 0.0–0.2)

## 2019-03-19 LAB — SAMPLE TO BLOOD BANK

## 2019-03-19 MED ORDER — ACETAMINOPHEN 325 MG PO TABS
650.0000 mg | ORAL_TABLET | Freq: Once | ORAL | Status: AC
  Administered 2019-03-19: 650 mg via ORAL

## 2019-03-19 MED ORDER — ACETAMINOPHEN 325 MG PO TABS
ORAL_TABLET | ORAL | Status: AC
  Filled 2019-03-19: qty 2

## 2019-03-19 MED ORDER — DIPHENHYDRAMINE HCL 25 MG PO CAPS
ORAL_CAPSULE | ORAL | Status: AC
  Filled 2019-03-19: qty 1

## 2019-03-19 MED ORDER — SODIUM CHLORIDE 0.9% IV SOLUTION
250.0000 mL | Freq: Once | INTRAVENOUS | Status: AC
  Administered 2019-03-19: 250 mL via INTRAVENOUS
  Filled 2019-03-19: qty 250

## 2019-03-19 MED ORDER — DIPHENHYDRAMINE HCL 25 MG PO CAPS
25.0000 mg | ORAL_CAPSULE | Freq: Once | ORAL | Status: AC
  Administered 2019-03-19: 25 mg via ORAL

## 2019-03-19 NOTE — Patient Instructions (Signed)

## 2019-03-19 NOTE — Telephone Encounter (Signed)
Dr. Alen Blew made aware of patient's reported creatinine of 4.27. No further orders received at this time.

## 2019-03-19 NOTE — Progress Notes (Signed)
Dr. Alen Blew made aware patient's hemoglobin is 8.5 today. Per Dr. Alen Blew orders placed for one unit of blood today. Infusion, RN made aware.

## 2019-03-19 NOTE — Telephone Encounter (Signed)
Received critical lab call for patient. Creatine 4.27 Lanelle Bal RN made aware

## 2019-03-20 LAB — TYPE AND SCREEN
ABO/RH(D): O POS
Antibody Screen: NEGATIVE
Unit division: 0

## 2019-03-20 LAB — BPAM RBC
Blood Product Expiration Date: 202104112359
ISSUE DATE / TIME: 202103101100
Unit Type and Rh: 5100

## 2019-03-20 LAB — PROSTATE-SPECIFIC AG, SERUM (LABCORP): Prostate Specific Ag, Serum: 0.3 ng/mL (ref 0.0–4.0)

## 2019-04-01 ENCOUNTER — Other Ambulatory Visit: Payer: Self-pay | Admitting: Oncology

## 2019-04-01 DIAGNOSIS — C61 Malignant neoplasm of prostate: Secondary | ICD-10-CM

## 2019-04-02 ENCOUNTER — Inpatient Hospital Stay: Payer: Medicare Other

## 2019-04-02 ENCOUNTER — Other Ambulatory Visit: Payer: Self-pay

## 2019-04-02 DIAGNOSIS — D649 Anemia, unspecified: Secondary | ICD-10-CM | POA: Diagnosis not present

## 2019-04-02 DIAGNOSIS — C61 Malignant neoplasm of prostate: Secondary | ICD-10-CM

## 2019-04-02 LAB — CBC WITH DIFFERENTIAL (CANCER CENTER ONLY)
Abs Immature Granulocytes: 0.01 10*3/uL (ref 0.00–0.07)
Basophils Absolute: 0 10*3/uL (ref 0.0–0.1)
Basophils Relative: 1 %
Eosinophils Absolute: 0.2 10*3/uL (ref 0.0–0.5)
Eosinophils Relative: 4 %
HCT: 22.7 % — ABNORMAL LOW (ref 39.0–52.0)
Hemoglobin: 6.6 g/dL — CL (ref 13.0–17.0)
Immature Granulocytes: 0 %
Lymphocytes Relative: 20 %
Lymphs Abs: 0.9 10*3/uL (ref 0.7–4.0)
MCH: 27 pg (ref 26.0–34.0)
MCHC: 29.1 g/dL — ABNORMAL LOW (ref 30.0–36.0)
MCV: 93 fL (ref 80.0–100.0)
Monocytes Absolute: 0.5 10*3/uL (ref 0.1–1.0)
Monocytes Relative: 12 %
Neutro Abs: 2.8 10*3/uL (ref 1.7–7.7)
Neutrophils Relative %: 63 %
Platelet Count: 290 10*3/uL (ref 150–400)
RBC: 2.44 MIL/uL — ABNORMAL LOW (ref 4.22–5.81)
RDW: 17.7 % — ABNORMAL HIGH (ref 11.5–15.5)
WBC Count: 4.3 10*3/uL (ref 4.0–10.5)
nRBC: 0 % (ref 0.0–0.2)

## 2019-04-02 LAB — SAMPLE TO BLOOD BANK

## 2019-04-02 LAB — PREPARE RBC (CROSSMATCH)

## 2019-04-02 MED ORDER — DIPHENHYDRAMINE HCL 25 MG PO CAPS
ORAL_CAPSULE | ORAL | Status: AC
  Filled 2019-04-02: qty 1

## 2019-04-02 MED ORDER — ACETAMINOPHEN 325 MG PO TABS
ORAL_TABLET | ORAL | Status: AC
  Filled 2019-04-02: qty 2

## 2019-04-02 MED ORDER — DIPHENHYDRAMINE HCL 25 MG PO CAPS
25.0000 mg | ORAL_CAPSULE | Freq: Once | ORAL | Status: AC
  Administered 2019-04-02: 25 mg via ORAL

## 2019-04-02 MED ORDER — ACETAMINOPHEN 325 MG PO TABS
650.0000 mg | ORAL_TABLET | Freq: Once | ORAL | Status: AC
  Administered 2019-04-02: 650 mg via ORAL

## 2019-04-02 MED ORDER — SODIUM CHLORIDE 0.9% IV SOLUTION
250.0000 mL | Freq: Once | INTRAVENOUS | Status: AC
  Administered 2019-04-02: 250 mL via INTRAVENOUS
  Filled 2019-04-02: qty 250

## 2019-04-02 NOTE — Progress Notes (Signed)
Received a call from the lab reporting patient's hemoglobin is 6.6. Dr. Alen Blew made aware and orders placed for 2 units packed red blood cells for patient to receive today. Infusion RN made aware.

## 2019-04-02 NOTE — Patient Instructions (Signed)
https://www.[facility].org/donate-blood/blood-donation-process/what-happens-to-donated-blood/blood-transfusions/types-of-[email or web address].html"> https://www.[facility].org/donate-blood/blood-donation-process/what-happens-to-donated-blood/blood-transfusions/risks-complications.html">  Blood Transfusion, Adult, Care After This sheet gives you information about how to care for yourself after your procedure. Your health care provider may also give you more specific instructions. If you have problems or questions, contact your health care provider. What can I expect after the procedure? After the procedure, it is common to have:  Bruising and soreness where the IV was inserted.  A fever or chills on the day of the procedure. This may be your body's response to the new blood cells received.  A headache. Follow these instructions at home: IV insertion site care      Follow instructions from your health care provider about how to take care of your IV insertion site. Make sure you: ? Wash your hands with soap and water before and after you change your bandage (dressing). If soap and water are not available, use hand sanitizer. ? Change your dressing as told by your health care provider.  Check your IV insertion site every day for signs of infection. Check for: ? Redness, swelling, or pain. ? Bleeding from the site. ? Warmth. ? Pus or a bad smell. General instructions  Take over-the-counter and prescription medicines only as told by your health care provider.  Rest as told by your health care provider.  Return to your normal activities as told by your health care provider.  Keep all follow-up visits as told by your health care provider. This is important. Contact a health care provider if:  You have itching or red, swollen areas of skin (hives).  You feel anxious.  You feel weak after doing your normal activities.  You have redness, swelling, warmth, or pain around the IV  insertion site.  You have blood coming from the IV insertion site that does not stop with pressure.  You have pus or a bad smell coming from your IV insertion site. Get help right away if:  You have symptoms of a serious allergic or immune system reaction, including: ? Trouble breathing or shortness of breath. ? Swelling of the face or feeling flushed. ? Fever or chills. ? Pain in the head, back, or chest. ? Dark urine or blood in the urine. ? Widespread rash. ? Fast heartbeat. ? Feeling dizzy or light-headed. If you receive your blood transfusion in an outpatient setting, you will be told whom to contact to report any reactions. These symptoms may represent a serious problem that is an emergency. Do not wait to see if the symptoms will go away. Get medical help right away. Call your local emergency services (911 in the [place] Do not drive yourself to the hospital. Summary  Bruising and tenderness around the IV insertion site are common.  Check your IV insertion site every day for signs of infection.  Rest as told by your health care provider. Return to your normal activities as told by your health care provider.  Get help right away for symptoms of a serious allergic or immune system reaction to blood transfusion. This information is not intended to replace advice given to you by your health care provider. Make sure you discuss any questions you have with your health care provider. Document Revised: 06/20/2018 Document Reviewed: 06/20/2018 Elsevier Patient Education  2020 [employer]  

## 2019-04-03 LAB — TYPE AND SCREEN
ABO/RH(D): O POS
Antibody Screen: NEGATIVE
Unit division: 0
Unit division: 0

## 2019-04-03 LAB — BPAM RBC
Blood Product Expiration Date: 202104252359
Blood Product Expiration Date: 202104262359
ISSUE DATE / TIME: 202103241206
ISSUE DATE / TIME: 202103241206
Unit Type and Rh: 5100
Unit Type and Rh: 5100

## 2019-04-07 MED FILL — XTANDI 40 MG CAPSULE: 40 | 30 days supply | Qty: 120 | Fill #0

## 2019-04-16 ENCOUNTER — Other Ambulatory Visit: Payer: Self-pay

## 2019-04-16 ENCOUNTER — Inpatient Hospital Stay: Payer: Medicare Other | Attending: Oncology

## 2019-04-16 ENCOUNTER — Telehealth: Payer: Self-pay

## 2019-04-16 ENCOUNTER — Inpatient Hospital Stay: Payer: Medicare Other

## 2019-04-16 DIAGNOSIS — D630 Anemia in neoplastic disease: Secondary | ICD-10-CM | POA: Diagnosis not present

## 2019-04-16 DIAGNOSIS — D649 Anemia, unspecified: Secondary | ICD-10-CM | POA: Insufficient documentation

## 2019-04-16 DIAGNOSIS — C61 Malignant neoplasm of prostate: Secondary | ICD-10-CM

## 2019-04-16 DIAGNOSIS — Z992 Dependence on renal dialysis: Secondary | ICD-10-CM | POA: Diagnosis not present

## 2019-04-16 DIAGNOSIS — N186 End stage renal disease: Secondary | ICD-10-CM | POA: Insufficient documentation

## 2019-04-16 LAB — PREPARE RBC (CROSSMATCH)

## 2019-04-16 LAB — CMP (CANCER CENTER ONLY)
ALT: 6 U/L (ref 0–44)
AST: 16 U/L (ref 15–41)
Albumin: 2.8 g/dL — ABNORMAL LOW (ref 3.5–5.0)
Alkaline Phosphatase: 94 U/L (ref 38–126)
Anion gap: 14 (ref 5–15)
BUN: 46 mg/dL — ABNORMAL HIGH (ref 8–23)
CO2: 28 mmol/L (ref 22–32)
Calcium: 9.5 mg/dL (ref 8.9–10.3)
Chloride: 100 mmol/L (ref 98–111)
Creatinine: 4.7 mg/dL (ref 0.61–1.24)
GFR, Est AFR Am: 12 mL/min — ABNORMAL LOW (ref >60)
GFR, Estimated: 10 mL/min — ABNORMAL LOW (ref >60)
Glucose, Bld: 100 mg/dL — ABNORMAL HIGH (ref 70–99)
Potassium: 4.9 mmol/L (ref 3.5–5.1)
Sodium: 142 mmol/L (ref 135–145)
Total Bilirubin: 0.5 mg/dL (ref 0.3–1.2)
Total Protein: 7.4 g/dL (ref 6.5–8.1)

## 2019-04-16 LAB — CBC WITH DIFFERENTIAL (CANCER CENTER ONLY)
Abs Immature Granulocytes: 0.01 10*3/uL (ref 0.00–0.07)
Basophils Absolute: 0 10*3/uL (ref 0.0–0.1)
Basophils Relative: 0 %
Eosinophils Absolute: 0.2 10*3/uL (ref 0.0–0.5)
Eosinophils Relative: 4 %
HCT: 25.9 % — ABNORMAL LOW (ref 39.0–52.0)
Hemoglobin: 7.9 g/dL — ABNORMAL LOW (ref 13.0–17.0)
Immature Granulocytes: 0 %
Lymphocytes Relative: 20 %
Lymphs Abs: 0.9 10*3/uL (ref 0.7–4.0)
MCH: 26.4 pg (ref 26.0–34.0)
MCHC: 30.5 g/dL (ref 30.0–36.0)
MCV: 86.6 fL (ref 80.0–100.0)
Monocytes Absolute: 0.5 10*3/uL (ref 0.1–1.0)
Monocytes Relative: 11 %
Neutro Abs: 3 10*3/uL (ref 1.7–7.7)
Neutrophils Relative %: 65 %
Platelet Count: 351 10*3/uL (ref 150–400)
RBC: 2.99 MIL/uL — ABNORMAL LOW (ref 4.22–5.81)
RDW: 17.5 % — ABNORMAL HIGH (ref 11.5–15.5)
WBC Count: 4.6 10*3/uL (ref 4.0–10.5)
nRBC: 0 % (ref 0.0–0.2)

## 2019-04-16 LAB — SAMPLE TO BLOOD BANK

## 2019-04-16 MED ORDER — DIPHENHYDRAMINE HCL 25 MG PO CAPS
25.0000 mg | ORAL_CAPSULE | Freq: Once | ORAL | Status: AC
  Administered 2019-04-16: 25 mg via ORAL

## 2019-04-16 MED ORDER — SODIUM CHLORIDE 0.9% IV SOLUTION
250.0000 mL | Freq: Once | INTRAVENOUS | Status: AC
  Administered 2019-04-16: 250 mL via INTRAVENOUS
  Filled 2019-04-16: qty 250

## 2019-04-16 MED ORDER — ACETAMINOPHEN 325 MG PO TABS
ORAL_TABLET | ORAL | Status: AC
  Filled 2019-04-16: qty 2

## 2019-04-16 MED ORDER — DIPHENHYDRAMINE HCL 25 MG PO CAPS
ORAL_CAPSULE | ORAL | Status: AC
  Filled 2019-04-16: qty 1

## 2019-04-16 MED ORDER — ACETAMINOPHEN 325 MG PO TABS
650.0000 mg | ORAL_TABLET | Freq: Once | ORAL | Status: AC
  Administered 2019-04-16: 650 mg via ORAL

## 2019-04-16 NOTE — Telephone Encounter (Signed)
Received a call from Laurence Compton, RN at [phone] who is carrying the lab phone. Reported creatinine is 4.7. Dr. Alen Blew made aware at 1035. No new orders received.

## 2019-04-16 NOTE — Progress Notes (Signed)
LATE ENTRY-Lab called critical value of creatinine 4.7.  Called Wendall Mola, RN who supports Dr. Alen Blew to let her know about the critical value result. Gardiner Rhyme, RN

## 2019-04-17 LAB — TYPE AND SCREEN
ABO/RH(D): O POS
Antibody Screen: NEGATIVE
Unit division: 0

## 2019-04-17 LAB — BPAM RBC
Blood Product Expiration Date: 202105052359
ISSUE DATE / TIME: 202104071053
Unit Type and Rh: 5100

## 2019-04-17 LAB — PROSTATE-SPECIFIC AG, SERUM (LABCORP): Prostate Specific Ag, Serum: 0.3 ng/mL (ref 0.0–4.0)

## 2019-04-30 ENCOUNTER — Other Ambulatory Visit: Payer: Self-pay

## 2019-04-30 ENCOUNTER — Inpatient Hospital Stay: Payer: Medicare Other | Admitting: Oncology

## 2019-04-30 ENCOUNTER — Inpatient Hospital Stay: Payer: Medicare Other

## 2019-04-30 ENCOUNTER — Other Ambulatory Visit: Payer: Self-pay | Admitting: Oncology

## 2019-04-30 VITALS — BP 136/74 | HR 67 | Temp 98.3°F | Resp 20 | Wt 164.7 lb

## 2019-04-30 DIAGNOSIS — C61 Malignant neoplasm of prostate: Secondary | ICD-10-CM

## 2019-04-30 DIAGNOSIS — D649 Anemia, unspecified: Secondary | ICD-10-CM | POA: Diagnosis not present

## 2019-04-30 LAB — CBC WITH DIFFERENTIAL (CANCER CENTER ONLY)
Abs Immature Granulocytes: 0.01 10*3/uL (ref 0.00–0.07)
Basophils Absolute: 0 10*3/uL (ref 0.0–0.1)
Basophils Relative: 1 %
Eosinophils Absolute: 0.1 10*3/uL (ref 0.0–0.5)
Eosinophils Relative: 3 %
HCT: 28.6 % — ABNORMAL LOW (ref 39.0–52.0)
Hemoglobin: 8.6 g/dL — ABNORMAL LOW (ref 13.0–17.0)
Immature Granulocytes: 0 %
Lymphocytes Relative: 18 %
Lymphs Abs: 0.9 10*3/uL (ref 0.7–4.0)
MCH: 26.8 pg (ref 26.0–34.0)
MCHC: 30.1 g/dL (ref 30.0–36.0)
MCV: 89.1 fL (ref 80.0–100.0)
Monocytes Absolute: 0.5 10*3/uL (ref 0.1–1.0)
Monocytes Relative: 11 %
Neutro Abs: 3.3 10*3/uL (ref 1.7–7.7)
Neutrophils Relative %: 67 %
Platelet Count: 281 10*3/uL (ref 150–400)
RBC: 3.21 MIL/uL — ABNORMAL LOW (ref 4.22–5.81)
RDW: 20.6 % — ABNORMAL HIGH (ref 11.5–15.5)
WBC Count: 4.8 10*3/uL (ref 4.0–10.5)
nRBC: 0 % (ref 0.0–0.2)

## 2019-04-30 LAB — SAMPLE TO BLOOD BANK

## 2019-04-30 NOTE — Progress Notes (Signed)
Hematology and Oncology Follow Up Visit  [name] [name] [id number] December 24, 1930 86 y.o. 04/30/2019 9:55 AM [name] [name], MDElkins, [name] [name], *   Principle Diagnosis: 86 year old man with:  1.  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  2.  Anemia: Appears to be related to chronic disease and malignancy. ia.  Prior Therapy: Androgen deprivation therapy and he remains on it under the care of Dr. Diona [name]. His PSA decreased to 31 in 2017. His PSA in November 2017 was 41 and in March 2018 was 29.    Current therapy:   Xtandi 160 mg daily started in March 2019.  Supportive red cell transfusion as needed.  Interim History: [name] [name] is here for repeat evaluation.  Since the last visit, he reports feeling well without any recent complaints.  He denies any nausea, vomiting or abdominal pain.  He denies any worsening bone pain or pathological fractures.  Continues to ambulate without any difficulties.  He continues to drive and still receive dialysis without any difficulties.  Denies any chest pain or dyspnea on exertion.  He denies any specific complications related to [place].               .    Medications: Reviewed without changes. Current Outpatient Medications  Medication Sig Dispense Refill  . amLODipine (NORVASC) 5 MG tablet TAKE 1 TABLET BY MOUTH DAILY 90 tablet 3  . aspirin EC 81 MG tablet Take 1 tablet (81 mg total) by mouth daily. (Patient taking differently: Take 81 mg by mouth every evening. ) 90 tablet 3  . atorvastatin (LIPITOR) 20 MG tablet Take 1 tablet (20 mg total) by mouth every evening. 90 tablet 3  . B Complex-C-Folic Acid (DIALYVITE 627) 0.8 MG TABS Take 0.8 mg by mouth every evening.   3  . cinacalcet (SENSIPAR) 60 MG tablet Take 60 mg by mouth every evening.     Marland Kitchen doxercalciferol (HECTOROL) 0.5 MCG capsule  Doxercalciferol (Hectorol)    . latanoprost (XALATAN) 0.005 % ophthalmic solution Place 1 drop into both eyes at bedtime.    Marland Kitchen leuprolide (LUPRON) 30 MG injection Inject 30 mg into the muscle every 4 (four) months.    . pantoprazole (PROTONIX) 40 MG tablet Take 1 tablet (40 mg total) by mouth daily. (Patient taking differently: Take 40 mg by mouth every evening. ) 30 tablet 0  . XTANDI 40 MG capsule TAKE 4 CAPSULES (160 MG TOTAL) BY MOUTH DAILY. 120 capsule 0   No current facility-administered medications for this visit.     Allergies: No Known Allergies    Physical Exam:  Blood pressure 136/74, pulse 67, temperature 98.3 F (36.8 C), temperature source Temporal, resp. rate 20, weight 164 lb 11.2 oz (74.7 kg), SpO2 98 %.   ECOG: 1    General appearance: Comfortable appearing without any discomfort Head: Normocephalic without any trauma Oropharynx: Mucous membranes are moist and pink without any thrush or ulcers. Eyes: Pupils are equal and round reactive to light. Lymph nodes: No cervical, supraclavicular, inguinal or axillary lymphadenopathy.   Heart:regular rate and rhythm.  S1 and S2 without leg edema. Lung: Clear without any rhonchi or wheezes.  No dullness to percussion. Abdomin: Soft, nontender, nondistended with good bowel sounds.  No hepatosplenomegaly. Musculoskeletal: No joint deformity or effusion.  Full range of motion noted. Neurological: No deficits noted on motor, sensory and deep tendon reflex  exam. Skin: No petechial rash or dryness.  Appeared moist.                Lab Results: Lab Results  Component Value Date   WBC 4.8 04/30/2019   HGB 8.6 (L) 04/30/2019   HCT 28.6 (L) 04/30/2019   MCV 89.1 04/30/2019   PLT 281 04/30/2019     Chemistry      Component Value Date/Time   NA 142 04/16/2019 0918   NA 143 09/29/2016 0810   K 4.9 04/16/2019 0918   K 4.3 09/29/2016 0810   CL 100 04/16/2019 0918   CO2 28 04/16/2019 0918   CO2 31 (H)  09/29/2016 0810   BUN 46 (H) 04/16/2019 0918   BUN 27.9 (H) 09/29/2016 0810   CREATININE 4.70 (HH) 04/16/2019 0918   CREATININE 7.4 (HH) 09/29/2016 0810      Component Value Date/Time   CALCIUM 9.5 04/16/2019 0918   CALCIUM 9.9 09/29/2016 0810   ALKPHOS 94 04/16/2019 0918   ALKPHOS 141 09/29/2016 0810   AST 16 04/16/2019 0918   AST 21 09/29/2016 0810   ALT 6 04/16/2019 0918   ALT 10 09/29/2016 0810   BILITOT 0.5 04/16/2019 0918   BILITOT 0.48 09/29/2016 0810          Results for [name] [name] (MRN [id number]) as of 04/30/2019 09:58  Ref. Range 03/19/2019 08:54 04/16/2019 09:18  Prostate Specific Ag, Serum Latest Ref Range: 0.0 - 4.0 ng/mL 0.3 0.3             Impression and Plan:   86 year old man with:  1.  Advanced prostate cancer with pelvic adenopathy since 2017.  He has castration-resistant at this time.    The natural course of this disease was reviewed today and treatment options were reiterated.  He is currently on Xtandi with disease under reasonable control currently.  His PSA remains reasonably low without any rapid progression.  Treatment options in the future were reviewed at this time.  These include systemic chemotherapy, switching to Zytiga.  For the time being we elected to continue with the same dose and schedule.  2. Androgen deprivation therapy: I recommended continuing this indefinitely.  He will receive that under the care of Dr. Diona [name].   3.  Anemia: Multifactorial in nature related to renal insufficiency, malignancy and blood loss.  Hemoglobin is adequate today and does not require any transfusion.  4.  Prognosis and goals of care: Therapy remains palliative although aggressive measures are warranted given his reasonable performance status despite his age.  5.  Renal failure: He is currently dialysis dependent without any issues at this time.  6. Follow-up: We will continue to follow every 2 to 3 weeks for laboratory testing and have MD  follow-up in 4 months.   30  minutes were spent on this visit.  The time was dedicated to updating his disease status, reviewing treatment options and addressing complications related to therapy and future plan of care.  Zola Button, MD 4/21/20219:55 AM

## 2019-05-01 ENCOUNTER — Telehealth: Payer: Self-pay | Admitting: Oncology

## 2019-05-01 NOTE — Telephone Encounter (Signed)
Called and spoke with patient. Confirmed appts scheduled

## 2019-05-05 MED FILL — XTANDI 40 MG CAPSULE: 40 | 30 days supply | Qty: 120 | Fill #0

## 2019-05-21 ENCOUNTER — Other Ambulatory Visit: Payer: Self-pay

## 2019-05-21 ENCOUNTER — Inpatient Hospital Stay: Payer: Medicare Other

## 2019-05-21 ENCOUNTER — Inpatient Hospital Stay: Payer: Medicare Other | Attending: Oncology

## 2019-05-21 ENCOUNTER — Telehealth: Payer: Self-pay

## 2019-05-21 DIAGNOSIS — Z992 Dependence on renal dialysis: Secondary | ICD-10-CM | POA: Diagnosis not present

## 2019-05-21 DIAGNOSIS — D649 Anemia, unspecified: Secondary | ICD-10-CM

## 2019-05-21 DIAGNOSIS — C61 Malignant neoplasm of prostate: Secondary | ICD-10-CM | POA: Diagnosis not present

## 2019-05-21 DIAGNOSIS — N186 End stage renal disease: Secondary | ICD-10-CM | POA: Diagnosis present

## 2019-05-21 LAB — CBC WITH DIFFERENTIAL (CANCER CENTER ONLY)
Abs Immature Granulocytes: 0.01 10*3/uL (ref 0.00–0.07)
Basophils Absolute: 0 10*3/uL (ref 0.0–0.1)
Basophils Relative: 1 %
Eosinophils Absolute: 0.1 10*3/uL (ref 0.0–0.5)
Eosinophils Relative: 2 %
HCT: 26.5 % — ABNORMAL LOW (ref 39.0–52.0)
Hemoglobin: 8 g/dL — ABNORMAL LOW (ref 13.0–17.0)
Immature Granulocytes: 0 %
Lymphocytes Relative: 19 %
Lymphs Abs: 0.8 10*3/uL (ref 0.7–4.0)
MCH: 27.8 pg (ref 26.0–34.0)
MCHC: 30.2 g/dL (ref 30.0–36.0)
MCV: 92 fL (ref 80.0–100.0)
Monocytes Absolute: 0.5 10*3/uL (ref 0.1–1.0)
Monocytes Relative: 12 %
Neutro Abs: 2.6 10*3/uL (ref 1.7–7.7)
Neutrophils Relative %: 66 %
Platelet Count: 271 10*3/uL (ref 150–400)
RBC: 2.88 MIL/uL — ABNORMAL LOW (ref 4.22–5.81)
RDW: 20.7 % — ABNORMAL HIGH (ref 11.5–15.5)
WBC Count: 4 10*3/uL (ref 4.0–10.5)
nRBC: 0 % (ref 0.0–0.2)

## 2019-05-21 LAB — CMP (CANCER CENTER ONLY)
ALT: 7 U/L (ref 0–44)
AST: 17 U/L (ref 15–41)
Albumin: 2.5 g/dL — ABNORMAL LOW (ref 3.5–5.0)
Alkaline Phosphatase: 94 U/L (ref 38–126)
Anion gap: 13 (ref 5–15)
BUN: 40 mg/dL — ABNORMAL HIGH (ref 8–23)
CO2: 31 mmol/L (ref 22–32)
Calcium: 9 mg/dL (ref 8.9–10.3)
Chloride: 98 mmol/L (ref 98–111)
Creatinine: 4.19 mg/dL (ref 0.61–1.24)
GFR, Est AFR Am: 14 mL/min — ABNORMAL LOW (ref >60)
GFR, Estimated: 12 mL/min — ABNORMAL LOW (ref >60)
Glucose, Bld: 118 mg/dL — ABNORMAL HIGH (ref 70–99)
Potassium: 3.8 mmol/L (ref 3.5–5.1)
Sodium: 142 mmol/L (ref 135–145)
Total Bilirubin: 0.4 mg/dL (ref 0.3–1.2)
Total Protein: 6.8 g/dL (ref 6.5–8.1)

## 2019-05-21 LAB — SAMPLE TO BLOOD BANK

## 2019-05-21 LAB — PREPARE RBC (CROSSMATCH)

## 2019-05-21 MED ORDER — SODIUM CHLORIDE 0.9% IV SOLUTION
250.0000 mL | Freq: Once | INTRAVENOUS | Status: AC
  Administered 2019-05-21: 09:00:00 250 mL via INTRAVENOUS
  Filled 2019-05-21: qty 250

## 2019-05-21 MED ORDER — DIPHENHYDRAMINE HCL 25 MG PO CAPS
ORAL_CAPSULE | ORAL | Status: AC
  Filled 2019-05-21: qty 1

## 2019-05-21 MED ORDER — ACETAMINOPHEN 325 MG PO TABS
ORAL_TABLET | ORAL | Status: AC
  Filled 2019-05-21: qty 2

## 2019-05-21 MED ORDER — ACETAMINOPHEN 325 MG PO TABS
650.0000 mg | ORAL_TABLET | Freq: Once | ORAL | Status: AC
  Administered 2019-05-21: 650 mg via ORAL

## 2019-05-21 MED ORDER — DIPHENHYDRAMINE HCL 25 MG PO CAPS
25.0000 mg | ORAL_CAPSULE | Freq: Once | ORAL | Status: AC
  Administered 2019-05-21: 25 mg via ORAL

## 2019-05-21 NOTE — Patient Instructions (Signed)
https://www.[facility].org/donate-blood/blood-donation-process/what-happens-to-donated-blood/blood-transfusions/types-of-[email or web address].html"> https://www.[facility].org/donate-blood/blood-donation-process/what-happens-to-donated-blood/blood-transfusions/risks-complications.html">  Blood Transfusion, Adult, Care After This sheet gives you information about how to care for yourself after your procedure. Your health care provider may also give you more specific instructions. If you have problems or questions, contact your health care provider. What can I expect after the procedure? After the procedure, it is common to have:  Bruising and soreness where the IV was inserted.  A fever or chills on the day of the procedure. This may be your body's response to the new blood cells received.  A headache. Follow these instructions at home: IV insertion site care      Follow instructions from your health care provider about how to take care of your IV insertion site. Make sure you: ? Wash your hands with soap and water before and after you change your bandage (dressing). If soap and water are not available, use hand sanitizer. ? Change your dressing as told by your health care provider.  Check your IV insertion site every day for signs of infection. Check for: ? Redness, swelling, or pain. ? Bleeding from the site. ? Warmth. ? Pus or a bad smell. General instructions  Take over-the-counter and prescription medicines only as told by your health care provider.  Rest as told by your health care provider.  Return to your normal activities as told by your health care provider.  Keep all follow-up visits as told by your health care provider. This is important. Contact a health care provider if:  You have itching or red, swollen areas of skin (hives).  You feel anxious.  You feel weak after doing your normal activities.  You have redness, swelling, warmth, or pain around the IV  insertion site.  You have blood coming from the IV insertion site that does not stop with pressure.  You have pus or a bad smell coming from your IV insertion site. Get help right away if:  You have symptoms of a serious allergic or immune system reaction, including: ? Trouble breathing or shortness of breath. ? Swelling of the face or feeling flushed. ? Fever or chills. ? Pain in the head, back, or chest. ? Dark urine or blood in the urine. ? Widespread rash. ? Fast heartbeat. ? Feeling dizzy or light-headed. If you receive your blood transfusion in an outpatient setting, you will be told whom to contact to report any reactions. These symptoms may represent a serious problem that is an emergency. Do not wait to see if the symptoms will go away. Get medical help right away. Call your local emergency services (911 in the [place] Do not drive yourself to the hospital. Summary  Bruising and tenderness around the IV insertion site are common.  Check your IV insertion site every day for signs of infection.  Rest as told by your health care provider. Return to your normal activities as told by your health care provider.  Get help right away for symptoms of a serious allergic or immune system reaction to blood transfusion. This information is not intended to replace advice given to you by your health care provider. Make sure you discuss any questions you have with your health care provider. Document Revised: 06/20/2018 Document Reviewed: 06/20/2018 Elsevier Patient Education  2020 [employer]  

## 2019-05-21 NOTE — Telephone Encounter (Signed)
CRITICAL VALUE STICKER  CRITICAL VALUE:Creatinine 4.19  RECEIVER (on-site recipient of call): Lenox Ponds LPN  DATE & TIME NOTIFIED: 05/21/19 859  MESSENGER (representative from lab):[facility]  MD NOTIFIED: Dr. Alen Blew  TIME OF NOTIFICATION:910  RESPONSE: MD aware

## 2019-05-22 LAB — BPAM RBC
Blood Product Expiration Date: 202106102359
ISSUE DATE / TIME: 202105121021
Unit Type and Rh: 5100

## 2019-05-22 LAB — TYPE AND SCREEN
ABO/RH(D): O POS
Antibody Screen: NEGATIVE
Unit division: 0

## 2019-05-22 LAB — PROSTATE-SPECIFIC AG, SERUM (LABCORP): Prostate Specific Ag, Serum: 0.3 ng/mL (ref 0.0–4.0)

## 2019-05-27 ENCOUNTER — Other Ambulatory Visit: Payer: Self-pay | Admitting: Oncology

## 2019-05-27 DIAGNOSIS — C61 Malignant neoplasm of prostate: Secondary | ICD-10-CM

## 2019-06-02 MED FILL — XTANDI 40 MG CAPSULE: 40 | 30 days supply | Qty: 120 | Fill #0

## 2019-06-10 ENCOUNTER — Other Ambulatory Visit: Payer: Self-pay | Admitting: Emergency Medicine

## 2019-06-10 DIAGNOSIS — D649 Anemia, unspecified: Secondary | ICD-10-CM

## 2019-06-11 ENCOUNTER — Other Ambulatory Visit: Payer: Self-pay

## 2019-06-11 ENCOUNTER — Telehealth: Payer: Self-pay

## 2019-06-11 ENCOUNTER — Inpatient Hospital Stay: Payer: Medicare Other | Attending: Oncology

## 2019-06-11 ENCOUNTER — Inpatient Hospital Stay: Payer: Medicare Other

## 2019-06-11 DIAGNOSIS — C61 Malignant neoplasm of prostate: Secondary | ICD-10-CM

## 2019-06-11 LAB — CBC WITH DIFFERENTIAL (CANCER CENTER ONLY)
Abs Immature Granulocytes: 0.01 10*3/uL (ref 0.00–0.07)
Basophils Absolute: 0 10*3/uL (ref 0.0–0.1)
Basophils Relative: 0 %
Eosinophils Absolute: 0 10*3/uL (ref 0.0–0.5)
Eosinophils Relative: 1 %
HCT: 27.3 % — ABNORMAL LOW (ref 39.0–52.0)
Hemoglobin: 8.3 g/dL — ABNORMAL LOW (ref 13.0–17.0)
Immature Granulocytes: 0 %
Lymphocytes Relative: 18 %
Lymphs Abs: 0.7 10*3/uL (ref 0.7–4.0)
MCH: 29.5 pg (ref 26.0–34.0)
MCHC: 30.4 g/dL (ref 30.0–36.0)
MCV: 97.2 fL (ref 80.0–100.0)
Monocytes Absolute: 0.5 10*3/uL (ref 0.1–1.0)
Monocytes Relative: 12 %
Neutro Abs: 2.6 10*3/uL (ref 1.7–7.7)
Neutrophils Relative %: 69 %
Platelet Count: 304 10*3/uL (ref 150–400)
RBC: 2.81 MIL/uL — ABNORMAL LOW (ref 4.22–5.81)
RDW: 18.5 % — ABNORMAL HIGH (ref 11.5–15.5)
WBC Count: 3.8 10*3/uL — ABNORMAL LOW (ref 4.0–10.5)
nRBC: 0 % (ref 0.0–0.2)

## 2019-06-11 LAB — SAMPLE TO BLOOD BANK

## 2019-06-11 NOTE — Telephone Encounter (Signed)
Received a call from lab reporting patient's hemoglobin 8.3. Dr. Alen Blew made aware and per Dr. Alen Blew no transfusion needed today. Infusion nurse made aware and will make patient aware.

## 2019-06-11 NOTE — Progress Notes (Signed)
Pt.'s HGB 8.3 per Dr. Alen Blew no transfusion needed today.

## 2019-06-25 ENCOUNTER — Other Ambulatory Visit: Payer: Self-pay | Admitting: Oncology

## 2019-06-25 DIAGNOSIS — C61 Malignant neoplasm of prostate: Secondary | ICD-10-CM

## 2019-07-02 ENCOUNTER — Other Ambulatory Visit: Payer: Self-pay

## 2019-07-02 ENCOUNTER — Inpatient Hospital Stay: Payer: Medicare Other

## 2019-07-02 ENCOUNTER — Telehealth: Payer: Self-pay

## 2019-07-02 DIAGNOSIS — C61 Malignant neoplasm of prostate: Secondary | ICD-10-CM

## 2019-07-02 LAB — CMP (CANCER CENTER ONLY)
ALT: 8 U/L (ref 0–44)
AST: 18 U/L (ref 15–41)
Albumin: 2.3 g/dL — ABNORMAL LOW (ref 3.5–5.0)
Alkaline Phosphatase: 119 U/L (ref 38–126)
Anion gap: 11 (ref 5–15)
BUN: 19 mg/dL (ref 8–23)
CO2: 31 mmol/L (ref 22–32)
Calcium: 7.9 mg/dL — ABNORMAL LOW (ref 8.9–10.3)
Chloride: 98 mmol/L (ref 98–111)
Creatinine: 4.23 mg/dL (ref 0.61–1.24)
GFR, Est AFR Am: 14 mL/min — ABNORMAL LOW (ref >60)
GFR, Estimated: 12 mL/min — ABNORMAL LOW (ref >60)
Glucose, Bld: 97 mg/dL (ref 70–99)
Potassium: 3.9 mmol/L (ref 3.5–5.1)
Sodium: 140 mmol/L (ref 135–145)
Total Bilirubin: 0.5 mg/dL (ref 0.3–1.2)
Total Protein: 6.9 g/dL (ref 6.5–8.1)

## 2019-07-02 LAB — CBC WITH DIFFERENTIAL (CANCER CENTER ONLY)
Abs Immature Granulocytes: 0.01 10*3/uL (ref 0.00–0.07)
Basophils Absolute: 0 10*3/uL (ref 0.0–0.1)
Basophils Relative: 1 %
Eosinophils Absolute: 0 10*3/uL (ref 0.0–0.5)
Eosinophils Relative: 1 %
HCT: 33.3 % — ABNORMAL LOW (ref 39.0–52.0)
Hemoglobin: 10.4 g/dL — ABNORMAL LOW (ref 13.0–17.0)
Immature Granulocytes: 0 %
Lymphocytes Relative: 28 %
Lymphs Abs: 0.9 10*3/uL (ref 0.7–4.0)
MCH: 27.4 pg (ref 26.0–34.0)
MCHC: 31.2 g/dL (ref 30.0–36.0)
MCV: 87.9 fL (ref 80.0–100.0)
Monocytes Absolute: 0.4 10*3/uL (ref 0.1–1.0)
Monocytes Relative: 13 %
Neutro Abs: 1.8 10*3/uL (ref 1.7–7.7)
Neutrophils Relative %: 57 %
Platelet Count: 243 10*3/uL (ref 150–400)
RBC: 3.79 MIL/uL — ABNORMAL LOW (ref 4.22–5.81)
RDW: 16.3 % — ABNORMAL HIGH (ref 11.5–15.5)
WBC Count: 3.1 10*3/uL — ABNORMAL LOW (ref 4.0–10.5)
nRBC: 0 % (ref 0.0–0.2)

## 2019-07-02 LAB — SAMPLE TO BLOOD BANK

## 2019-07-02 NOTE — Telephone Encounter (Signed)
Received critical creatinine call from the lab at 0920. Creatinine 4.23. Dr. Alen Blew made aware at 0935. No further instructions received.

## 2019-07-02 NOTE — Progress Notes (Signed)
Patient made aware NO blood transfusion needed today and was given a copy of his lab results - this was all per MD [facility].

## 2019-07-03 MED FILL — XTANDI 40 MG CAPSULE: 40 | 30 days supply | Qty: 120 | Fill #0

## 2019-07-04 LAB — PROSTATE-SPECIFIC AG, SERUM (LABCORP): Prostate Specific Ag, Serum: 0.5 ng/mL (ref 0.0–4.0)

## 2019-07-23 ENCOUNTER — Inpatient Hospital Stay: Payer: Medicare Other

## 2019-07-23 ENCOUNTER — Telehealth: Payer: Self-pay

## 2019-07-23 ENCOUNTER — Inpatient Hospital Stay: Payer: Medicare Other | Attending: Oncology | Admitting: Oncology

## 2019-07-23 ENCOUNTER — Other Ambulatory Visit: Payer: Self-pay

## 2019-07-23 VITALS — BP 141/46 | HR 53 | Temp 97.7°F | Resp 20 | Wt 157.3 lb

## 2019-07-23 DIAGNOSIS — Z992 Dependence on renal dialysis: Secondary | ICD-10-CM | POA: Insufficient documentation

## 2019-07-23 DIAGNOSIS — N289 Disorder of kidney and ureter, unspecified: Secondary | ICD-10-CM | POA: Diagnosis not present

## 2019-07-23 DIAGNOSIS — Z79899 Other long term (current) drug therapy: Secondary | ICD-10-CM | POA: Insufficient documentation

## 2019-07-23 DIAGNOSIS — C61 Malignant neoplasm of prostate: Secondary | ICD-10-CM | POA: Insufficient documentation

## 2019-07-23 DIAGNOSIS — D649 Anemia, unspecified: Secondary | ICD-10-CM | POA: Diagnosis not present

## 2019-07-23 DIAGNOSIS — D630 Anemia in neoplastic disease: Secondary | ICD-10-CM | POA: Insufficient documentation

## 2019-07-23 DIAGNOSIS — N179 Acute kidney failure, unspecified: Secondary | ICD-10-CM | POA: Diagnosis not present

## 2019-07-23 LAB — CBC WITH DIFFERENTIAL (CANCER CENTER ONLY)
Abs Immature Granulocytes: 0.01 10*3/uL (ref 0.00–0.07)
Basophils Absolute: 0 10*3/uL (ref 0.0–0.1)
Basophils Relative: 0 %
Eosinophils Absolute: 0 10*3/uL (ref 0.0–0.5)
Eosinophils Relative: 1 %
HCT: 35 % — ABNORMAL LOW (ref 39.0–52.0)
Hemoglobin: 11 g/dL — ABNORMAL LOW (ref 13.0–17.0)
Immature Granulocytes: 0 %
Lymphocytes Relative: 29 %
Lymphs Abs: 1.3 10*3/uL (ref 0.7–4.0)
MCH: 26.8 pg (ref 26.0–34.0)
MCHC: 31.4 g/dL (ref 30.0–36.0)
MCV: 85.2 fL (ref 80.0–100.0)
Monocytes Absolute: 0.5 10*3/uL (ref 0.1–1.0)
Monocytes Relative: 11 %
Neutro Abs: 2.7 10*3/uL (ref 1.7–7.7)
Neutrophils Relative %: 59 %
Platelet Count: 275 10*3/uL (ref 150–400)
RBC: 4.11 MIL/uL — ABNORMAL LOW (ref 4.22–5.81)
RDW: 17 % — ABNORMAL HIGH (ref 11.5–15.5)
WBC Count: 4.6 10*3/uL (ref 4.0–10.5)
nRBC: 0 % (ref 0.0–0.2)

## 2019-07-23 LAB — CMP (CANCER CENTER ONLY)
ALT: 8 U/L (ref 0–44)
AST: 19 U/L (ref 15–41)
Albumin: 2.5 g/dL — ABNORMAL LOW (ref 3.5–5.0)
Alkaline Phosphatase: 107 U/L (ref 38–126)
Anion gap: 13 (ref 5–15)
BUN: 25 mg/dL — ABNORMAL HIGH (ref 8–23)
CO2: 31 mmol/L (ref 22–32)
Calcium: 8.5 mg/dL — ABNORMAL LOW (ref 8.9–10.3)
Chloride: 98 mmol/L (ref 98–111)
Creatinine: 3.87 mg/dL (ref 0.61–1.24)
GFR, Est AFR Am: 15 mL/min — ABNORMAL LOW (ref >60)
GFR, Estimated: 13 mL/min — ABNORMAL LOW (ref >60)
Glucose, Bld: 104 mg/dL — ABNORMAL HIGH (ref 70–99)
Potassium: 3.9 mmol/L (ref 3.5–5.1)
Sodium: 142 mmol/L (ref 135–145)
Total Bilirubin: 0.6 mg/dL (ref 0.3–1.2)
Total Protein: 7.4 g/dL (ref 6.5–8.1)

## 2019-07-23 LAB — SAMPLE TO BLOOD BANK

## 2019-07-23 NOTE — Telephone Encounter (Signed)
CRITICAL VALUE STICKER  CRITICAL VALUE: Creatinine 3.87  RECEIVER (on-site recipient of call): Wendall Mola, RN  [place] NOTIFIED: 07/23/2019 @ 0920  MESSENGER (representative from lab): M. Nicole Kindred  MD NOTIFIED: Dr. Dahlia Byes  TIME OF NOTIFICATION: 0962  RESPONSE: Noted. No further instructions received.

## 2019-07-23 NOTE — Progress Notes (Signed)
Hematology and Oncology Follow Up Visit  [name] [name] [id number] [birth year] 86 y.o. 07/23/2019 8:38 AM [name] [name], MDElkins, [name] [name], *   Principle Diagnosis: 86 year old man with:  1.  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  He presented with Gleason score 4+4 = 8 PSA of 369 at the time of diagnosis.  2.  Multifactorial anemia related to renal disease and malignancy.  Prior Therapy: Androgen deprivation therapy and he remains on it under the care of Dr. Diona [name]. His PSA decreased to 31 in 2017. His PSA in November 2017 was 41 and in March 2018 was 29.    Current therapy:   Xtandi 160 mg daily started in March 2019.  Supportive red cell transfusion as needed.  Interim History: [name] [name] is here for a follow-up visit.  Since the last visit, he reports no major changes in his health.  He continues to tolerate Xtandi without any recent complaints.  He denies any excessive fatigue tiredness or dyspnea on exertion.  He denies any recent hospitalization or illnesses.  He denies any bone pain or pathological fractures.               .    Medications: Updated on review. Current Outpatient Medications  Medication Sig Dispense Refill  . amLODipine (NORVASC) 5 MG tablet TAKE 1 TABLET BY MOUTH DAILY 90 tablet 3  . aspirin EC 81 MG tablet Take 1 tablet (81 mg total) by mouth daily. (Patient taking differently: Take 81 mg by mouth every evening. ) 90 tablet 3  . atorvastatin (LIPITOR) 20 MG tablet Take 1 tablet (20 mg total) by mouth every evening. 90 tablet 3  . B Complex-C-Folic Acid (DIALYVITE 124) 0.8 MG TABS Take 0.8 mg by mouth every evening.   3  . cinacalcet (SENSIPAR) 60 MG tablet Take 60 mg by mouth every evening.     Marland Kitchen doxercalciferol (HECTOROL) 0.5 MCG capsule Doxercalciferol (Hectorol)    . latanoprost (XALATAN) 0.005 % ophthalmic solution Place 1 drop into both eyes at bedtime.    Marland Kitchen leuprolide  (LUPRON) 30 MG injection Inject 30 mg into the muscle every 4 (four) months.    . pantoprazole (PROTONIX) 40 MG tablet Take 1 tablet (40 mg total) by mouth daily. (Patient taking differently: Take 40 mg by mouth every evening. ) 30 tablet 0  . XTANDI 40 MG capsule TAKE 4 CAPSULES (160 MG TOTAL) BY MOUTH DAILY. 120 capsule 0   No current facility-administered medications for this visit.     Allergies: No Known Allergies    Physical Exam: Blood pressure (!) 141/46, pulse (!) 53, temperature 97.7 F (36.5 C), temperature source Temporal, resp. rate 20, weight 157 lb 4.8 oz (71.4 kg), SpO2 98 %.     ECOG: 1   General appearance: Alert, awake without any distress. Head: Atraumatic without abnormalities Oropharynx: Without any thrush or ulcers. Eyes: No scleral icterus. Lymph nodes: No lymphadenopathy noted in the cervical, supraclavicular, or axillary nodes Heart:regular rate and rhythm, without any murmurs or gallops.   Lung: Clear to auscultation without any rhonchi, wheezes or dullness to percussion. Abdomin: Soft, nontender without any shifting dullness or ascites. Musculoskeletal: No clubbing or cyanosis. Neurological: No motor or sensory deficits. Skin: No rashes or lesions.               Lab Results: Lab Results  Component Value Date   WBC 3.1 (L) 07/02/2019   HGB 10.4 (L) 07/02/2019   HCT  33.3 (L) 07/02/2019   MCV 87.9 07/02/2019   PLT 243 07/02/2019     Chemistry      Component Value Date/Time   NA 140 07/02/2019 0822   NA 143 09/29/2016 0810   K 3.9 07/02/2019 0822   K 4.3 09/29/2016 0810   CL 98 07/02/2019 0822   CO2 31 07/02/2019 0822   CO2 31 (H) 09/29/2016 0810   BUN 19 07/02/2019 0822   BUN 27.9 (H) 09/29/2016 0810   CREATININE 4.23 (HH) 07/02/2019 0822   CREATININE 7.4 (HH) 09/29/2016 0810      Component Value Date/Time   CALCIUM 7.9 (L) 07/02/2019 0822   CALCIUM 9.9 09/29/2016 0810   ALKPHOS 119 07/02/2019 0822   ALKPHOS 141  09/29/2016 0810   AST 18 07/02/2019 0822   AST 21 09/29/2016 0810   ALT 8 07/02/2019 0822   ALT 10 09/29/2016 0810   BILITOT 0.5 07/02/2019 0822   BILITOT 0.48 09/29/2016 0810              Results for [name] [name], [name] [name] (MRN [id number]) as of 07/23/2019 08:39  Ref. Range 05/21/2019 07:58 07/02/2019 08:22  Prostate Specific Ag, Serum Latest Ref Range: 0.0 - 4.0 ng/mL 0.3 0.5          Impression and Plan:   86 year old man with:  1.  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     His PSA continues to be under reasonable control on Xtandi.  PSA currently at 0.5 with a slight rise.  Risks and benefits of continuing this treatment long-term were reviewed.  Complications occluding hypertension, fatigue among other complications were reviewed.  Alternative options would include systemic chemotherapy were reiterated.  At this time I see no need to change his treatment is agreeable to continue.  2. Androgen deprivation therapy: He is currently receiving it under the care of Dr. Diona [name].  I recommended continuing for the time being.  3.  Anemia: Multifactorial in nature related to malignancy and renal disease.  Is requiring packed red cell transfusions periodically.  His last transfusion was in May 2021.  4.  Prognosis and goals of care: His disease is incurable although aggressive measures are warranted given his reasonable performance status.  5.  Renal failure: Continues to receive dialysis without any decline in ability to do so.  6. Follow-up: He will follow-up monthly for evaluation for possible packed red cell transfusion and MD follow-up in 4 months.   30  minutes were dedicated to this encounter.  The time was spent on reviewing his disease status, discussing treatment options and addressing complications related to his cancer and cancer care.  [name] Button, MD 7/14/20218:38 AM

## 2019-07-24 ENCOUNTER — Telehealth: Payer: Self-pay

## 2019-07-24 LAB — PROSTATE-SPECIFIC AG, SERUM (LABCORP): Prostate Specific Ag, Serum: 0.6 ng/mL (ref 0.0–4.0)

## 2019-07-24 NOTE — Telephone Encounter (Signed)
Called and spoke to patient's wife and made her aware of PSA result. She verbalized understanding and will let patient know.

## 2019-07-24 NOTE — Telephone Encounter (Signed)
-----   Message from Wyatt Portela, MD sent at 07/24/2019  8:35 AM EDT ----- Please let him know his PSA is stable at this point. No changes.

## 2019-07-29 ENCOUNTER — Telehealth: Payer: Self-pay | Admitting: Oncology

## 2019-07-29 NOTE — Telephone Encounter (Signed)
Scheduled per los, spoke with patient's wife and patient will be notified of upcoming appointments.

## 2019-07-30 ENCOUNTER — Other Ambulatory Visit: Payer: Self-pay | Admitting: Oncology

## 2019-07-30 DIAGNOSIS — C61 Malignant neoplasm of prostate: Secondary | ICD-10-CM

## 2019-08-04 MED FILL — XTANDI 40 MG CAPSULE: 40 | 30 days supply | Qty: 120 | Fill #0

## 2019-08-13 ENCOUNTER — Inpatient Hospital Stay: Payer: Medicare Other | Attending: Oncology

## 2019-08-13 ENCOUNTER — Telehealth: Payer: Self-pay

## 2019-08-13 ENCOUNTER — Inpatient Hospital Stay: Payer: Medicare Other

## 2019-08-13 ENCOUNTER — Other Ambulatory Visit: Payer: Self-pay

## 2019-08-13 DIAGNOSIS — D630 Anemia in neoplastic disease: Secondary | ICD-10-CM | POA: Diagnosis not present

## 2019-08-13 DIAGNOSIS — C61 Malignant neoplasm of prostate: Secondary | ICD-10-CM | POA: Insufficient documentation

## 2019-08-13 LAB — CBC WITH DIFFERENTIAL (CANCER CENTER ONLY)
Abs Immature Granulocytes: 0.01 10*3/uL (ref 0.00–0.07)
Basophils Absolute: 0 10*3/uL (ref 0.0–0.1)
Basophils Relative: 0 %
Eosinophils Absolute: 0 10*3/uL (ref 0.0–0.5)
Eosinophils Relative: 0 %
HCT: 30.5 % — ABNORMAL LOW (ref 39.0–52.0)
Hemoglobin: 9.4 g/dL — ABNORMAL LOW (ref 13.0–17.0)
Immature Granulocytes: 0 %
Lymphocytes Relative: 28 %
Lymphs Abs: 1.3 10*3/uL (ref 0.7–4.0)
MCH: 24.5 pg — ABNORMAL LOW (ref 26.0–34.0)
MCHC: 30.8 g/dL (ref 30.0–36.0)
MCV: 79.4 fL — ABNORMAL LOW (ref 80.0–100.0)
Monocytes Absolute: 0.6 10*3/uL (ref 0.1–1.0)
Monocytes Relative: 12 %
Neutro Abs: 2.8 10*3/uL (ref 1.7–7.7)
Neutrophils Relative %: 60 %
Platelet Count: 302 10*3/uL (ref 150–400)
RBC: 3.84 MIL/uL — ABNORMAL LOW (ref 4.22–5.81)
RDW: 17 % — ABNORMAL HIGH (ref 11.5–15.5)
WBC Count: 4.7 10*3/uL (ref 4.0–10.5)
nRBC: 0 % (ref 0.0–0.2)

## 2019-08-13 LAB — SAMPLE TO BLOOD BANK

## 2019-08-13 NOTE — Telephone Encounter (Signed)
Pt here today for Lab and possible blood transfusion.  HGB 9.4 No blood transfusion needed. Pt. Informed copy of labs given to Pt.

## 2019-08-14 LAB — PROSTATE-SPECIFIC AG, SERUM (LABCORP): Prostate Specific Ag, Serum: 0.7 ng/mL (ref 0.0–4.0)

## 2019-08-28 ENCOUNTER — Other Ambulatory Visit: Payer: Self-pay | Admitting: Oncology

## 2019-08-28 DIAGNOSIS — C61 Malignant neoplasm of prostate: Secondary | ICD-10-CM

## 2019-09-02 MED FILL — XTANDI 40 MG CAPSULE: 40 | 30 days supply | Qty: 120 | Fill #0

## 2019-09-10 ENCOUNTER — Inpatient Hospital Stay: Payer: Medicare Other | Attending: Oncology

## 2019-09-10 ENCOUNTER — Other Ambulatory Visit: Payer: Self-pay

## 2019-09-10 ENCOUNTER — Encounter: Payer: Self-pay | Admitting: *Deleted

## 2019-09-10 ENCOUNTER — Inpatient Hospital Stay: Payer: Medicare Other

## 2019-09-10 DIAGNOSIS — C61 Malignant neoplasm of prostate: Secondary | ICD-10-CM

## 2019-09-10 LAB — CBC WITH DIFFERENTIAL (CANCER CENTER ONLY)
Abs Immature Granulocytes: 0.01 10*3/uL (ref 0.00–0.07)
Basophils Absolute: 0 10*3/uL (ref 0.0–0.1)
Basophils Relative: 0 %
Eosinophils Absolute: 0 10*3/uL (ref 0.0–0.5)
Eosinophils Relative: 0 %
HCT: 27.6 % — ABNORMAL LOW (ref 39.0–52.0)
Hemoglobin: 8.4 g/dL — ABNORMAL LOW (ref 13.0–17.0)
Immature Granulocytes: 0 %
Lymphocytes Relative: 24 %
Lymphs Abs: 0.9 10*3/uL (ref 0.7–4.0)
MCH: 26.8 pg (ref 26.0–34.0)
MCHC: 30.4 g/dL (ref 30.0–36.0)
MCV: 88.2 fL (ref 80.0–100.0)
Monocytes Absolute: 0.4 10*3/uL (ref 0.1–1.0)
Monocytes Relative: 10 %
Neutro Abs: 2.6 10*3/uL (ref 1.7–7.7)
Neutrophils Relative %: 66 %
Platelet Count: 304 10*3/uL (ref 150–400)
RBC: 3.13 MIL/uL — ABNORMAL LOW (ref 4.22–5.81)
RDW: 23.9 % — ABNORMAL HIGH (ref 11.5–15.5)
WBC Count: 4 10*3/uL (ref 4.0–10.5)
nRBC: 0 % (ref 0.0–0.2)

## 2019-09-10 LAB — SAMPLE TO BLOOD BANK

## 2019-09-10 NOTE — Progress Notes (Signed)
Hgb 8.4, per Dr. Alen Blew, no transfusion needed.  [name] [name] informed pt no transfusion needed today.  Copy of labs given.

## 2019-09-11 LAB — PROSTATE-SPECIFIC AG, SERUM (LABCORP): Prostate Specific Ag, Serum: 1 ng/mL (ref 0.0–4.0)

## 2019-09-30 ENCOUNTER — Other Ambulatory Visit: Payer: Self-pay | Admitting: Oncology

## 2019-09-30 ENCOUNTER — Other Ambulatory Visit: Payer: Self-pay | Admitting: Internal Medicine

## 2019-09-30 DIAGNOSIS — C61 Malignant neoplasm of prostate: Secondary | ICD-10-CM

## 2019-09-30 MED ORDER — XTANDI 40 MG PO CAPS: ORAL_CAPSULE | ORAL | 0 refills | Status: DC

## 2019-10-01 ENCOUNTER — Emergency Department (HOSPITAL_COMMUNITY): Payer: Medicare Other

## 2019-10-01 ENCOUNTER — Other Ambulatory Visit: Payer: Self-pay

## 2019-10-01 ENCOUNTER — Inpatient Hospital Stay (HOSPITAL_COMMUNITY)
Admission: EM | Admit: 2019-10-01 | Discharge: 2019-10-05 | DRG: 064 | Disposition: A | Discharge status: Hospice | Payer: Medicare Other | Attending: Family Medicine | Admitting: Family Medicine

## 2019-10-01 DIAGNOSIS — Z7189 Other specified counseling: Secondary | ICD-10-CM

## 2019-10-01 DIAGNOSIS — I615 Nontraumatic intracerebral hemorrhage, intraventricular: Secondary | ICD-10-CM | POA: Diagnosis not present

## 2019-10-01 DIAGNOSIS — E785 Hyperlipidemia, unspecified: Secondary | ICD-10-CM | POA: Diagnosis present

## 2019-10-01 DIAGNOSIS — Z992 Dependence on renal dialysis: Secondary | ICD-10-CM | POA: Diagnosis not present

## 2019-10-01 DIAGNOSIS — R4701 Aphasia: Secondary | ICD-10-CM | POA: Diagnosis present

## 2019-10-01 DIAGNOSIS — D62 Acute posthemorrhagic anemia: Secondary | ICD-10-CM | POA: Diagnosis not present

## 2019-10-01 DIAGNOSIS — M109 Gout, unspecified: Secondary | ICD-10-CM | POA: Diagnosis present

## 2019-10-01 DIAGNOSIS — K921 Melena: Secondary | ICD-10-CM | POA: Diagnosis present

## 2019-10-01 DIAGNOSIS — Z66 Do not resuscitate: Secondary | ICD-10-CM | POA: Diagnosis not present

## 2019-10-01 DIAGNOSIS — I252 Old myocardial infarction: Secondary | ICD-10-CM

## 2019-10-01 DIAGNOSIS — N186 End stage renal disease: Secondary | ICD-10-CM | POA: Diagnosis not present

## 2019-10-01 DIAGNOSIS — R634 Abnormal weight loss: Secondary | ICD-10-CM | POA: Diagnosis present

## 2019-10-01 DIAGNOSIS — Z823 Family history of stroke: Secondary | ICD-10-CM

## 2019-10-01 DIAGNOSIS — S065X9A Traumatic subdural hemorrhage with loss of consciousness of unspecified duration, initial encounter: Secondary | ICD-10-CM | POA: Diagnosis not present

## 2019-10-01 DIAGNOSIS — I214 Non-ST elevation (NSTEMI) myocardial infarction: Secondary | ICD-10-CM

## 2019-10-01 DIAGNOSIS — I25810 Atherosclerosis of coronary artery bypass graft(s) without angina pectoris: Secondary | ICD-10-CM | POA: Diagnosis present

## 2019-10-01 DIAGNOSIS — E46 Unspecified protein-calorie malnutrition: Secondary | ICD-10-CM | POA: Diagnosis present

## 2019-10-01 DIAGNOSIS — R410 Disorientation, unspecified: Secondary | ICD-10-CM | POA: Diagnosis not present

## 2019-10-01 DIAGNOSIS — C61 Malignant neoplasm of prostate: Secondary | ICD-10-CM | POA: Diagnosis present

## 2019-10-01 DIAGNOSIS — R197 Diarrhea, unspecified: Secondary | ICD-10-CM | POA: Diagnosis present

## 2019-10-01 DIAGNOSIS — I248 Other forms of acute ischemic heart disease: Secondary | ICD-10-CM | POA: Diagnosis present

## 2019-10-01 DIAGNOSIS — I6200 Nontraumatic subdural hemorrhage, unspecified: Secondary | ICD-10-CM

## 2019-10-01 DIAGNOSIS — R569 Unspecified convulsions: Secondary | ICD-10-CM | POA: Diagnosis not present

## 2019-10-01 DIAGNOSIS — R4182 Altered mental status, unspecified: Secondary | ICD-10-CM | POA: Diagnosis present

## 2019-10-01 DIAGNOSIS — F0280 Dementia in other diseases classified elsewhere without behavioral disturbance: Secondary | ICD-10-CM

## 2019-10-01 DIAGNOSIS — I351 Nonrheumatic aortic (valve) insufficiency: Secondary | ICD-10-CM | POA: Diagnosis not present

## 2019-10-01 DIAGNOSIS — G92 Toxic encephalopathy: Secondary | ICD-10-CM | POA: Diagnosis present

## 2019-10-01 DIAGNOSIS — I6201 Nontraumatic acute subdural hemorrhage: Secondary | ICD-10-CM | POA: Diagnosis present

## 2019-10-01 DIAGNOSIS — Z7982 Long term (current) use of aspirin: Secondary | ICD-10-CM

## 2019-10-01 DIAGNOSIS — T17908A Unspecified foreign body in respiratory tract, part unspecified causing other injury, initial encounter: Secondary | ICD-10-CM

## 2019-10-01 DIAGNOSIS — Z515 Encounter for palliative care: Secondary | ICD-10-CM

## 2019-10-01 DIAGNOSIS — D630 Anemia in neoplastic disease: Secondary | ICD-10-CM | POA: Diagnosis present

## 2019-10-01 DIAGNOSIS — I350 Nonrheumatic aortic (valve) stenosis: Secondary | ICD-10-CM | POA: Diagnosis not present

## 2019-10-01 DIAGNOSIS — N2581 Secondary hyperparathyroidism of renal origin: Secondary | ICD-10-CM | POA: Diagnosis present

## 2019-10-01 DIAGNOSIS — Z79890 Hormone replacement therapy (postmenopausal): Secondary | ICD-10-CM

## 2019-10-01 DIAGNOSIS — D631 Anemia in chronic kidney disease: Secondary | ICD-10-CM | POA: Diagnosis present

## 2019-10-01 DIAGNOSIS — Z8546 Personal history of malignant neoplasm of prostate: Secondary | ICD-10-CM

## 2019-10-01 DIAGNOSIS — K449 Diaphragmatic hernia without obstruction or gangrene: Secondary | ICD-10-CM | POA: Diagnosis present

## 2019-10-01 DIAGNOSIS — K2980 Duodenitis without bleeding: Secondary | ICD-10-CM | POA: Diagnosis present

## 2019-10-01 DIAGNOSIS — F0390 Unspecified dementia without behavioral disturbance: Secondary | ICD-10-CM | POA: Diagnosis present

## 2019-10-01 DIAGNOSIS — G3183 Dementia with Lewy bodies: Secondary | ICD-10-CM | POA: Diagnosis not present

## 2019-10-01 DIAGNOSIS — I6203 Nontraumatic chronic subdural hemorrhage: Secondary | ICD-10-CM | POA: Diagnosis present

## 2019-10-01 DIAGNOSIS — C7951 Secondary malignant neoplasm of bone: Secondary | ICD-10-CM | POA: Diagnosis present

## 2019-10-01 DIAGNOSIS — D649 Anemia, unspecified: Secondary | ICD-10-CM | POA: Diagnosis not present

## 2019-10-01 DIAGNOSIS — E1122 Type 2 diabetes mellitus with diabetic chronic kidney disease: Secondary | ICD-10-CM | POA: Diagnosis present

## 2019-10-01 DIAGNOSIS — I9589 Other hypotension: Secondary | ICD-10-CM | POA: Diagnosis not present

## 2019-10-01 DIAGNOSIS — Z79899 Other long term (current) drug therapy: Secondary | ICD-10-CM

## 2019-10-01 DIAGNOSIS — I340 Nonrheumatic mitral (valve) insufficiency: Secondary | ICD-10-CM | POA: Diagnosis not present

## 2019-10-01 DIAGNOSIS — I120 Hypertensive chronic kidney disease with stage 5 chronic kidney disease or end stage renal disease: Secondary | ICD-10-CM | POA: Diagnosis present

## 2019-10-01 DIAGNOSIS — I609 Nontraumatic subarachnoid hemorrhage, unspecified: Secondary | ICD-10-CM | POA: Diagnosis present

## 2019-10-01 DIAGNOSIS — Z833 Family history of diabetes mellitus: Secondary | ICD-10-CM

## 2019-10-01 DIAGNOSIS — R509 Fever, unspecified: Secondary | ICD-10-CM | POA: Diagnosis present

## 2019-10-01 DIAGNOSIS — Z6821 Body mass index (BMI) 21.0-21.9, adult: Secondary | ICD-10-CM

## 2019-10-01 DIAGNOSIS — R627 Adult failure to thrive: Secondary | ICD-10-CM | POA: Diagnosis present

## 2019-10-01 DIAGNOSIS — I2510 Atherosclerotic heart disease of native coronary artery without angina pectoris: Secondary | ICD-10-CM | POA: Diagnosis present

## 2019-10-01 DIAGNOSIS — E11649 Type 2 diabetes mellitus with hypoglycemia without coma: Secondary | ICD-10-CM | POA: Diagnosis not present

## 2019-10-01 DIAGNOSIS — Z20822 Contact with and (suspected) exposure to covid-19: Secondary | ICD-10-CM | POA: Diagnosis not present

## 2019-10-01 DIAGNOSIS — Z8719 Personal history of other diseases of the digestive system: Secondary | ICD-10-CM

## 2019-10-01 DIAGNOSIS — Z87891 Personal history of nicotine dependence: Secondary | ICD-10-CM

## 2019-10-01 DIAGNOSIS — K3189 Other diseases of stomach and duodenum: Secondary | ICD-10-CM | POA: Diagnosis present

## 2019-10-01 DIAGNOSIS — Z951 Presence of aortocoronary bypass graft: Secondary | ICD-10-CM

## 2019-10-01 LAB — SARS CORONAVIRUS 2 BY RT PCR (HOSPITAL ORDER, PERFORMED IN ~~LOC~~ HOSPITAL LAB): SARS Coronavirus 2: NEGATIVE

## 2019-10-01 LAB — PREPARE RBC (CROSSMATCH)

## 2019-10-01 LAB — CBC
HCT: 21 % — ABNORMAL LOW (ref 39.0–52.0)
Hemoglobin: 6.3 g/dL — CL (ref 13.0–17.0)
MCH: 24.9 pg — ABNORMAL LOW (ref 26.0–34.0)
MCHC: 30 g/dL (ref 30.0–36.0)
MCV: 83 fL (ref 80.0–100.0)
Platelets: 353 10*3/uL (ref 150–400)
RBC: 2.53 MIL/uL — ABNORMAL LOW (ref 4.22–5.81)
RDW: 19.1 % — ABNORMAL HIGH (ref 11.5–15.5)
WBC: 8.3 10*3/uL (ref 4.0–10.5)
nRBC: 0 % (ref 0.0–0.2)

## 2019-10-01 LAB — ETHANOL: Alcohol, Ethyl (B): <10 mg/dL (ref <10)

## 2019-10-01 LAB — DIFFERENTIAL
Abs Immature Granulocytes: 0.05 10*3/uL (ref 0.00–0.07)
Basophils Absolute: 0 10*3/uL (ref 0.0–0.1)
Basophils Relative: 0 %
Eosinophils Absolute: 0 10*3/uL (ref 0.0–0.5)
Eosinophils Relative: 0 %
Immature Granulocytes: 1 %
Lymphocytes Relative: 11 %
Lymphs Abs: 0.9 10*3/uL (ref 0.7–4.0)
Monocytes Absolute: 1 10*3/uL (ref 0.1–1.0)
Monocytes Relative: 12 %
Neutro Abs: 6.4 10*3/uL (ref 1.7–7.7)
Neutrophils Relative %: 76 %

## 2019-10-01 LAB — COMPREHENSIVE METABOLIC PANEL
ALT: 14 U/L (ref 0–44)
AST: 45 U/L — ABNORMAL HIGH (ref 15–41)
Albumin: 2.2 g/dL — ABNORMAL LOW (ref 3.5–5.0)
Alkaline Phosphatase: 48 U/L (ref 38–126)
Anion gap: 15 (ref 5–15)
BUN: 48 mg/dL — ABNORMAL HIGH (ref 8–23)
CO2: 30 mmol/L (ref 22–32)
Calcium: 8.6 mg/dL — ABNORMAL LOW (ref 8.9–10.3)
Chloride: 89 mmol/L — ABNORMAL LOW (ref 98–111)
Creatinine, Ser: 5.02 mg/dL — ABNORMAL HIGH (ref 0.61–1.24)
GFR calc Af Amer: 11 mL/min — ABNORMAL LOW (ref >60)
GFR calc non Af Amer: 10 mL/min — ABNORMAL LOW (ref >60)
Glucose, Bld: 84 mg/dL (ref 70–99)
Potassium: 4.3 mmol/L (ref 3.5–5.1)
Sodium: 134 mmol/L — ABNORMAL LOW (ref 135–145)
Total Bilirubin: 0.5 mg/dL (ref 0.3–1.2)
Total Protein: 7.6 g/dL (ref 6.5–8.1)

## 2019-10-01 LAB — PROTIME-INR
INR: 1.4 — ABNORMAL HIGH (ref 0.8–1.2)
Prothrombin Time: 16.9 seconds — ABNORMAL HIGH (ref 11.4–15.2)

## 2019-10-01 LAB — POC OCCULT BLOOD, ED: Fecal Occult Bld: POSITIVE — AB

## 2019-10-01 LAB — LACTIC ACID, PLASMA: Lactic Acid, Venous: 1.2 mmol/L (ref 0.5–1.9)

## 2019-10-01 LAB — CBG MONITORING, ED: Glucose-Capillary: 98 mg/dL (ref 70–99)

## 2019-10-01 LAB — TROPONIN I (HIGH SENSITIVITY): Troponin I (High Sensitivity): 162 ng/L (ref <18)

## 2019-10-01 LAB — APTT: aPTT: 48 seconds — ABNORMAL HIGH (ref 24–36)

## 2019-10-01 MED ORDER — SODIUM CHLORIDE 0.9% FLUSH
3.0000 mL | Freq: Two times a day (BID) | INTRAVENOUS | Status: DC
  Administered 2019-10-01 – 2019-10-02 (×4): 3 mL via INTRAVENOUS

## 2019-10-01 MED ORDER — PANTOPRAZOLE SODIUM 40 MG IV SOLR
40.0000 mg | Freq: Two times a day (BID) | INTRAVENOUS | Status: DC
  Administered 2019-10-01 – 2019-10-03 (×4): 40 mg via INTRAVENOUS
  Filled 2019-10-01 (×4): qty 40

## 2019-10-01 MED ORDER — SODIUM CHLORIDE 0.9% FLUSH
3.0000 mL | Freq: Two times a day (BID) | INTRAVENOUS | Status: DC
  Administered 2019-10-01 – 2019-10-02 (×3): 3 mL via INTRAVENOUS

## 2019-10-01 MED ORDER — SODIUM CHLORIDE 0.9% IV SOLUTION: Freq: Once | INTRAVENOUS | Status: DC

## 2019-10-01 MED ORDER — SODIUM CHLORIDE 0.9 % IV SOLN: 250.0000 mL | INTRAVENOUS | Status: DC | PRN

## 2019-10-01 MED ORDER — ACETAMINOPHEN 325 MG PO TABS
650.0000 mg | ORAL_TABLET | Freq: Once | ORAL | Status: AC
  Administered 2019-10-01: 650 mg via ORAL
  Filled 2019-10-01: qty 2

## 2019-10-01 MED ORDER — SODIUM CHLORIDE 0.9 % IV SOLN
100.0000 mL/h | INTRAVENOUS | Status: DC
  Administered 2019-10-01: 100 mL/h via INTRAVENOUS

## 2019-10-01 MED ORDER — SODIUM CHLORIDE 0.9 % IV SOLN: INTRAVENOUS | Status: DC

## 2019-10-01 MED ORDER — SODIUM CHLORIDE 0.9% FLUSH
3.0000 mL | INTRAVENOUS | Status: DC | PRN
  Administered 2019-10-01: 3 mL via INTRAVENOUS

## 2019-10-01 MED ORDER — CHLORHEXIDINE GLUCONATE CLOTH 2 % EX PADS
6.0000 | MEDICATED_PAD | Freq: Every day | CUTANEOUS | Status: DC
  Administered 2019-10-02 – 2019-10-03 (×2): 6 via TOPICAL

## 2019-10-01 MED ORDER — ENZALUTAMIDE 40 MG PO CAPS
160.0000 mg | ORAL_CAPSULE | Freq: Every day | ORAL | Status: DC
  Filled 2019-10-01 (×3): qty 4

## 2019-10-01 MED ORDER — ACETAMINOPHEN 325 MG PO TABS
650.0000 mg | ORAL_TABLET | Freq: Four times a day (QID) | ORAL | Status: DC | PRN
  Administered 2019-10-03: 650 mg via ORAL
  Filled 2019-10-01: qty 2

## 2019-10-01 MED ORDER — SODIUM CHLORIDE 0.9 % IV BOLUS
500.0000 mL | Freq: Once | INTRAVENOUS | Status: AC
  Administered 2019-10-01: 500 mL via INTRAVENOUS

## 2019-10-01 MED ORDER — ACETAMINOPHEN 650 MG RE SUPP: 650.0000 mg | Freq: Four times a day (QID) | RECTAL | Status: DC | PRN

## 2019-10-01 NOTE — Progress Notes (Signed)
Patient evaluated by me today before 4 PM and the care plan has been discussed with the resident. I will cosign resident's H&P once completed.

## 2019-10-01 NOTE — ED Notes (Signed)
At present I still have not received a call for PT

## 2019-10-01 NOTE — ED Provider Notes (Signed)
[facility] EMERGENCY DEPARTMENT Provider Note   CSN: [id number] Arrival date & time: 10/01/19  3016     History No chief complaint on file.   [name] [name] is a 86 y.o. male.  HPI   Patient was brought into the emergency room for evaluation of weakness and lethargy since dialysis yesterday.  Patient himself states he does not think anything in particular is bothering him.  He does admit to having a little bit of weakness in his legs.  He denies having any trouble with fevers or chills.  No chest pain or shortness of breath.  No vomiting or diarrhea.  According to the EMS report the wife indicated the patient urinated on himself this morning.  He did have a fever up to 101.4 at home by EMS report.  Past Medical History:  Diagnosis Date  . Anemia of chronic disease   . Coronary artery disease   . Dialysis patient ([place])    T, TH, Sat  . Dyslipidemia   . ESRD (end stage renal disease) on dialysis [facility])    "Industrial; T, Thurs, Sat" (12/26/2012)  . Gout   . History of blood transfusion   . Hypertension   . Metastatic cancer to bone ([place]) dx'd 04/04/17  . MYOCARDIAL INFARCTION, ACUTE, NON-Q WAVE 04/26/2009   Qualifier: Diagnosis of  By: Percival Spanish, MD, Farrel Gordon    . Obesity   . Prostate CA ([place]) dx'd 12/2014   initial dx  . Type II diabetes mellitus ([place])    "diet controlled" (12/26/2012)    Patient Active Problem List   Diagnosis Date Noted  . Coronary artery disease involving native coronary artery of native heart without angina pectoris 09/24/2017  . Prostate CA ([place]) 09/07/2017  . Anemia 05/29/2015  . Occult blood in stools 05/29/2015  . Anemia of chronic kidney failure 08/15/2014  . Shortness of breath   . Symptomatic anemia 06/11/2014  . 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 End-stage renal disease (ESRD) ([place]) 12/30/2012  . Secondary hyperparathyroidism ([place]) 12/30/2012  . Hypotension 06/16/2011  . Palpitations 06/16/2011  . CAD (coronary artery disease)  of artery bypass graft 04/26/2009  . DYSPNEA ON EXERTION 03/04/2009  . HLD (hyperlipidemia) 03/03/2009  . GOUT 03/03/2009  . Obesity 03/03/2009  . Essential hypertension 03/03/2009    Past Surgical History:  Procedure Laterality Date  . ARTERIOVENOUS GRAFT PLACEMENT Right 12/2008   "upper arm"  . AV FISTULA PLACEMENT    . BIOPSY  07/09/2017   Procedure: BIOPSY;  Surgeon: Wonda Horner, MD;  Location: [facility] ENDOSCOPY;  Service: Endoscopy;;  . CARDIAC CATHETERIZATION  12/30/2012  . COLONOSCOPY W/ POLYPECTOMY    . CORONARY ARTERY BYPASS GRAFT N/A 12/31/2012   Procedure: CORONARY ARTERY BYPASS GRAFTING times four using left internal mammary and right saphenous vein;  Surgeon: Grace Isaac, MD;  Location: [place];  Service: Open Heart Surgery;  Laterality: N/A;  . ESOPHAGOGASTRODUODENOSCOPY (EGD) WITH PROPOFOL N/A 07/09/2017   Procedure: ESOPHAGOGASTRODUODENOSCOPY (EGD) WITH PROPOFOL;  Surgeon: Wonda Horner, MD;  Location: [facility] ENDOSCOPY;  Service: Endoscopy;  Laterality: N/A;  . EYE SURGERY Bilateral    cataract  . INTRAOPERATIVE TRANSESOPHAGEAL ECHOCARDIOGRAM N/A 12/31/2012   Procedure: INTRAOPERATIVE TRANSESOPHAGEAL ECHOCARDIOGRAM;  Surgeon: Grace Isaac, MD;  Location: [place];  Service: Open Heart Surgery;  Laterality: N/A;  . LEFT HEART CATHETERIZATION WITH CORONARY ANGIOGRAM N/A 12/30/2012   Procedure: LEFT HEART CATHETERIZATION WITH CORONARY ANGIOGRAM;  Surgeon: Burnell Blanks, MD;  Location: [facility] CATH  LAB;  Service: Cardiovascular;  Laterality: N/A;  . REVISON OF ARTERIOVENOUS FISTULA Right 02/06/2018   Procedure: REVISION PLICATION OF ARTERIOVENOUS FISTULA RIGHT ARM;  Surgeon: Waynetta Sandy, MD;  Location: [place];  Service: Vascular;  Laterality: Right;  . REVISON OF ARTERIOVENOUS FISTULA Right 04/03/2018   Procedure: REVISION OF ARTERIOVENOUS FISTULA RIGHT ARM;  Surgeon: Rosetta Posner, MD;  Location: [facility] OR;  Service: Vascular;  Laterality: Right;       Family  History  Problem Relation Age of Onset  . Stroke Mother   . Diabetes Sister     Social History   Tobacco Use  . Smoking status: Former Smoker    Packs/day: 1.00    Years: 40.00    Pack years: 40.00    Types: Cigarettes    Quit date: 06/16/1995    Years since quitting: 24.3  . Smokeless tobacco: Never Used  Vaping Use  . Vaping Use: Never used  Substance Use Topics  . Alcohol use: No    Alcohol/week: 0.0 standard drinks  . Drug use: No    Home Medications Prior to Admission medications   Medication Sig Start Date End Date Taking? Authorizing Provider  amLODipine (NORVASC) 5 MG tablet TAKE 1 TABLET BY MOUTH DAILY 12/25/18   Minus Breeding, MD  aspirin EC 81 MG tablet Take 1 tablet (81 mg total) by mouth daily. Patient taking differently: Take 81 mg by mouth every evening.  05/07/17   Almyra Deforest, PA  atorvastatin (LIPITOR) 20 MG tablet Take 1 tablet (20 mg total) by mouth every evening. 12/25/18   Minus Breeding, MD  B Complex-C-Folic Acid (DIALYVITE 671) 0.8 MG TABS Take 0.8 mg by mouth every evening.  08/07/17   [provider]  cinacalcet (SENSIPAR) 60 MG tablet Take 60 mg by mouth every evening.     [provider]  doxercalciferol (HECTOROL) 0.5 MCG capsule Doxercalciferol (Hectorol) 11/12/18 11/11/19  [provider]  enzalutamide (XTANDI) 40 MG capsule TAKE 4 CAPSULES (160 MG TOTAL) BY MOUTH DAILY. 09/30/19   Curt Bears, MD  latanoprost (XALATAN) 0.005 % ophthalmic solution Place 1 drop into both eyes at bedtime.    [provider]  leuprolide (LUPRON) 30 MG injection Inject 30 mg into the muscle every 4 (four) months.    [provider]  pantoprazole (PROTONIX) 40 MG tablet Take 1 tablet (40 mg total) by mouth daily. Patient taking differently: Take 40 mg by mouth every evening.  07/06/18   Lajean Saver, MD    Allergies    Patient has no known allergies.  Review of Systems   Review of Systems  All other systems reviewed  and are negative.   Physical Exam Updated Vital Signs BP (!) 126/43 (BP Location: Left Arm)   Pulse 82   Temp 98.5 F (36.9 C) (Oral)   Resp 16   SpO2 98%   Physical Exam Vitals and nursing note reviewed.  Constitutional:      Appearance: He is well-developed.     Comments: Elderly, frail, listless   HENT:     Head: Normocephalic and atraumatic.     Right Ear: External ear normal.     Left Ear: External ear normal.  Eyes:     General: No scleral icterus.       Right eye: No discharge.        Left eye: No discharge.     Conjunctiva/sclera: Conjunctivae normal.  Neck:     Trachea: No tracheal deviation.  Cardiovascular:  Rate and Rhythm: Normal rate and regular rhythm.  Pulmonary:     Effort: Pulmonary effort is normal. No respiratory distress.     Breath sounds: Normal breath sounds. No stridor. No wheezing or rales.  Abdominal:     General: Bowel sounds are normal. There is no distension.     Palpations: Abdomen is soft.     Tenderness: There is no abdominal tenderness. There is no guarding or rebound.  Musculoskeletal:        General: No tenderness.     Cervical back: Neck supple.     Right lower leg: No edema.     Left lower leg: No edema.  Skin:    General: Skin is warm and dry.     Findings: No rash.  Neurological:     General: No focal deficit present.     Mental Status: He is alert. He is disoriented.     Cranial Nerves: No cranial nerve deficit (no facial droop, extraocular movements intact, no slurred speech).     Sensory: No sensory deficit.     Motor: No abnormal muscle tone or seizure activity.     Coordination: Coordination normal.     Comments: Patient is aware of his location.  He was not able to tell me his age.  He does follow commands.  He is able to lift his arms and his legs but does have increased difficulty and does appear weak overall     ED Results / Procedures / Treatments   Labs (all labs ordered are listed, but only abnormal  results are displayed) Labs Reviewed  PROTIME-INR - Abnormal; Notable for the following components:      Result Value   Prothrombin Time 16.9 (*)    INR 1.4 (*)    All other components within normal limits  APTT - Abnormal; Notable for the following components:   aPTT 48 (*)    All other components within normal limits  CBC - Abnormal; Notable for the following components:   RBC 2.53 (*)    Hemoglobin 6.3 (*)    HCT 21.0 (*)    MCH 24.9 (*)    RDW 19.1 (*)    All other components within normal limits  COMPREHENSIVE METABOLIC PANEL - Abnormal; Notable for the following components:   Sodium 134 (*)    Chloride 89 (*)    BUN 48 (*)    Creatinine, Ser 5.02 (*)    Calcium 8.6 (*)    Albumin 2.2 (*)    AST 45 (*)    GFR calc non Af Amer 10 (*)    GFR calc Af Amer 11 (*)    All other components within normal limits  POC OCCULT BLOOD, ED - Abnormal; Notable for the following components:   Fecal Occult Bld POSITIVE (*)    All other components within normal limits  SARS CORONAVIRUS 2 BY RT PCR (HOSPITAL ORDER, [place])  CULTURE, BLOOD (ROUTINE X 2)  CULTURE, BLOOD (ROUTINE X 2)  ETHANOL  DIFFERENTIAL  LACTIC ACID, PLASMA  RAPID URINE DRUG SCREEN, HOSP PERFORMED  URINALYSIS, ROUTINE W REFLEX MICROSCOPIC  PREPARE RBC (CROSSMATCH)    EKG EKG Interpretation  Date/Time:  Wednesday October 01 2019 09:07:10 EDT Ventricular Rate:  82 PR Interval:    QRS Duration: 102 QT Interval:  382 QTC Calculation: 447 R Axis:   -6 Text Interpretation: sinus rhythm Repol abnrm suggests ischemia, anterolateral no change since last tracing Confirmed by Dorie Rank (  [id number]) on 10/01/2019 9:28:54 AM   Radiology CT HEAD WO CONTRAST  Result Date: 10/01/2019 CLINICAL DATA:  Altered mental status EXAM: CT HEAD WITHOUT CONTRAST TECHNIQUE: Contiguous axial images were obtained from the base of the skull through the vertex without intravenous contrast. COMPARISON:  None.  FINDINGS: Brain: There is atrophy and chronic small vessel disease changes. No acute intracranial abnormality. Specifically, no hemorrhage, hydrocephalus, mass lesion, acute infarction, or significant intracranial injury. Vascular: No hyperdense vessel or unexpected calcification. Skull: No acute calvarial abnormality. Sinuses/Orbits: Near complete opacification of the left paranasal sinuses. Mucosal thickening throughout the right paranasal sinuses. Mastoid air cells clear. Other: None IMPRESSION: Atrophy, chronic microvascular disease. No acute intracranial abnormality. Chronic sinusitis. Electronically Signed   By: Rolm Baptise M.D.   On: 10/01/2019 10:15   DG Chest Portable 1 View  Result Date: 10/01/2019 CLINICAL DATA:  Weakness. Additional provided: Patient reports weakness and lethargy since returning from dialysis yesterday. EXAM: PORTABLE CHEST 1 VIEW COMPARISON:  Prior chest radiograph 07/06/2017 and earlier. FINDINGS: Prior median sternotomy and CABG. Stable, mild cardiomegaly. Aortic atherosclerosis. No appreciable airspace consolidation or pulmonary edema. No evidence of pleural effusion or pneumothorax. No acute bony abnormality identified. IMPRESSION: No evidence of acute cardiopulmonary abnormality. Stable mild cardiomegaly status post CABG. Aortic Atherosclerosis (ICD10-I70.0). Electronically Signed   By: Kellie Simmering DO   On: 10/01/2019 09:37    Procedures .Critical Care Performed by: Dorie Rank, MD Authorized by: Dorie Rank, MD   Critical care provider statement:    Critical care time (minutes):  45   Critical care was time spent personally by me on the following activities:  Discussions with consultants, evaluation of patient's response to treatment, examination of patient, ordering and performing treatments and interventions, ordering and review of laboratory studies, ordering and review of radiographic studies, pulse oximetry, re-evaluation of patient's condition, obtaining  history from patient or surrogate and review of old charts   (including critical care time)  Medications Ordered in ED Medications  sodium chloride 0.9 % bolus 500 mL (500 mLs Intravenous New Bag/Given 10/01/19 1117)    Followed by  0.9 %  sodium chloride infusion (100 mL/hr Intravenous New Bag/Given 10/01/19 1117)  0.9 %  sodium chloride infusion (Manually program via Guardrails IV Fluids) (has no administration in time range)    ED Course  I have reviewed the triage vital signs and the nursing notes.  Pertinent labs & imaging results that were available during my care of the patient were reviewed by me and considered in my medical decision making (see chart for details).  Clinical Course as of Sep 30 1213  Wed Oct 01, 2019  1107 Labs reviewed.  Elevated BUN and creatinine consistent with his CKD   [JK]  1107 Hemoglobin down to 6.3.  This is decreased from previous values.   [JK]  1108 No acute findings on head CT and chest x-ray.   [LN]  9892 Discussed findings with patient and family.  Patient agrees to blood transfusion   [JK]  1123 Fecal occult is positive.  Findings suggestive of GI bleeding   [JK]  1158 Discussed with GI, Dr Cristina Gong.  Will see patient in consult    [JK]    Clinical Course User Index [JK] Dorie Rank, MD   MDM Rules/Calculators/A&P                         Patient presented to the ED for evaluation of generalized weakness.  Patient  is alert and awake.  He has normal vital signs.  There was report of fever at home by EMS although he does not have a fever here.  No signs of infection.  Lactic acid level is normal.  Patient does not have a leukocytosis.  We will continue to monitor.  Patient does have anemia that is increased from previous.  He also has guaiac positive stools.  Consistent with GI bleeding.  I have ordered blood transfusions.  I will consult with the medical service for admission and I will consult gastroenterology.     Final Clinical  Impression(s) / ED Diagnoses Final diagnoses:  Symptomatic anemia  Stage 5 chronic kidney disease on chronic dialysis [facility])     Dorie Rank, MD 10/01/19 1216

## 2019-10-01 NOTE — ED Triage Notes (Signed)
BIB [facility] with complaints of weakness and lethargic since returning from dialysis yesterday. EMS reports needing assistance times 2 to ambulate to the stretcher despite pt stating he "feels no different". Wife at home reported pt had an episode of urinating on himself and pt is not usually incontinent. EMS reports AOx4. Fever of 101.4. Other VSS. NAD at this time.

## 2019-10-01 NOTE — H&P (Addendum)
[facility] Admission History and Physical Service Pager: [phone]  Patient name: [name] [name] Medical record number: [id number] Date of birth: [birth year] Age: 86 y.o. Gender: male  Primary Care Provider: [name], MD Consultants: GI, nephrology Code Status: DNR was confirmed with wife Preferred Emergency Contact: Dixon Boos [phone]  Chief Complaint: Weakness and lethargy  Assessment and Plan: [name] [name] is a 86 y.o. male presenting with weakness and lethargy. PMH is significant for ESRD, secondary hyperparathyroidism, HLD, gout, HTN, CAD s/p CABG (2014), anemia, occult blood in stools, prostatic cancer.   Acute change in Mental status  Confusion  Weakness Patient presented to the ED with 1 week history of change in mental status, weakness, confusion per wife with 1 month history of decreased PO intake and weight loss (6lbs in 1 month). On arrival to the ED patient was A&O x4, however on admission patient was A&O x1 (oriented to self, intermittently to place).  Per wife, at baseline patient is able to complete ADLs and some IADL's. Unclear etiology of confusion as patient does not have overt signs of infection/sepsis or stroke.  EtOH level wnl. BUN 48. DDx: Symptomatic anemia (although patient has long history of acute symptomatic anemia in past without change associated change in mental status), CNS infection (less likely due to normal WBC, no nuchal rigidity), acute neurologic event (less likely as CT head was negative for acute process, patient has no focal neurologic deficits), consider patient induced/drug intoxication (less likely, EtOH level normal, patient has no history of drug use), bacteremia (less likely due to normal WBC, and no systemic symptoms, with no true documented fever since arrival to ED, and normal lactic acid), cardiac etiology (although no murmurs appreciated). -Admit to progressive, attending Dr. Gwendlyn Deutscher -Consider MRI in  a.m. if no improvement in confusion, or acute worsening -Follow-up TSH -Follow-up ammonia level -Follow-up pro calcitonin -Follow-up UDS, GI panel - IV fluids - neuro check q4 hours  - 2U pRBC - trend trops, repeat EKG - PT/OT recs - NPO until swallow study  Symptomatic anemia, in the setting of chronic anemia  Patient presented to the ED with weakness and lethargy since his hemodialysis session yesterday (9/21). Patient with history of chronically low Hgb in setting of anemia of CKD and malignancy, possible MDS(?). Patient receives regular transfusions in the outpatient setting with oncology.  Last documented transfusion was in May 2021.  Of note on 06/2018 presented with symptomatic anemia with a hemoglobin of 4.5 with a positive FOBT; at that time patient refused admission was transfused 3U and discharged home.  On admission today, patient with symptomatic anemia with hemoglobin of 6.3 (last hgb 8.3 in 09/10/19). Patient has had several positive FOBT since 2019.  Patient incontinent of dark stool in the room. Patient known to Dr. Clarene Essex at [place]. Per GI, patient's last colonoscopy was ~2016 with adenoma; upper endoscopy ~2019 showed 2 duodenal ulcers.  Patient takes low dose aspirin daily but is also on pantoprazole.  Transfusion threshold is 8 given CAD. Significant labs include Hgb 6.3, HCT 21, platelets 353, PT 16.9, INR 1.4, APTT 48. CT head: Atrophy, chronic microvascular disease; no acute intracranial abnormality; chronic sinusitis. DDx: Suspect multifactorial given chronic episodes of severe anemia however concern for acute upper GI bleed (short time duration for decrease of hemoglobin by 2, history of ulcers, dark bloody stools, FOBT positive, history of duodenal ulcers, on low-dose aspirin). Gi consulted in ED, plan for endoscopy on 9/23. -Admit to progressive, attending Dr.  Eniola -GI consulted, appreciate recs.  EGD planned for 9/23 at 0845. -Transfusing 2 units PRBC -Follow-up  posttransfusion H&H -IV Protonix 40 every 12 hours - NPO at midnight  Fever of unknown source Per ED note, EMS reported a patient temperature of 101.4.  In the room patient had a temperature of 100.2.  There is no obvious source for infection at the moment.  Physical exam unremarkable with no nuchal rigidity.  Lactic acid normal, WBC 8.3.  CXR negative for acute process.  Hemodynamically stable on room air.  DDx: Consider gastrointestinal cause (patient has diarrhea), urinary cause (patient is reportedly oliguric per wife who reports he has not complained of symptoms), cardiac cause (however no new murmurs) -Follow-up blood cultures -Follow-up GI panel and C. difficile -Follow-up UA with culture -Continue to monitor temperature - EKG, trops - consider echo if continues to fever  Chest pain  Elevated Trop Patient complained of chest pain in the ED, however denied during admission interview . Labs s/f Troponin 162, PT 16.9, INR 1.4, APTT 48. No baseline trop to compare to given ESRD. CXR: No evidence of acute cardiopulmonary abnormality; stable mild cardiomegaly status post CABG; aortic atherosclerosis. DDx: Consider ACS (troponin elevation to 162, CXR negative, EKG with some concern for ST depression), possibly was truly abdominal/epigastric pain in the setting of possible GI bleed. Elevated trop may be due to demand ischemia in setting of symptomatic anemia.  -Follow-up repeat troponin, EKG -Follow-up blood culture - continue to monitor  ESRD Patient has a history of ESRD and is treated with HD on Tuesdays/Thursdays/Saturday.  Patient received last HD session on 9/21 as scheduled.  Patient creatinine on admission is 5.02 (baseline is around 3.8-4.3) with a BUN 48.  Sodium is 134, K 4.3, GFR 10  -Nephrology consulted for hemodialysis -Monitor with daily CMP  Prostatic cancer Patient is followed by oncology (Dr. 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  Patient regularly gets hemoglobin checked with oncology, and has had several transfusions, the last one being on 05/21/2019.  Home medications include currently taking Xtandi 160 mg daily (4 doses of 40 mg), and leuprolide 30 mg injection every 4 months (last dose given on 9/21 per wife). -Continue home [place]   Hypertension Patient blood pressures on admission ranged between 96-126/39-69. Current home meds include amlodipine 5 mg.  Due to some low blood pressures and low diastolic pressures, home amlodipine will be held. -Hold home amlodipine -Continue to monitor blood pressures   FEN/GI: NPO until swallow study and EGD tomorrow, replete electrolytes as needed Prophylaxis: SCDs  Disposition: Progressive  History of Present Illness:  [name] [name] is a 86 y.o. male presenting with weakness and lethargy.  During interview patient was in an altered mental status only x1 to self, occasionally oriented to general location of the hospital.  Due to this patient's wife Ahmere Hemenway was called to confirm history questions. Per wife, for the last week the patient has had more confusion with repeating himself more often, as well as a decrease of energy. Prior to last week patient was doing all of his own ADLs, mowing the yard, and driving.  For the last month the patient has had a decrease in appetite with resultant weight loss. She denies that he has had any recent cough or congestion, nausea/vomiting, dysuria. Wife patient reports that patient had an episode of diarrhea this morning on himself and that it  looked like runny dark chocolate  Review Of Systems: Per HPI with the following additions:   Review of Systems  Unable to perform ROS: Mental status change     Patient Active Problem List   Diagnosis Date Noted  . Coronary artery disease involving native coronary artery of native heart without angina pectoris 09/24/2017  .  Prostate CA ([place]) 09/07/2017  . Anemia 05/29/2015  . Occult blood in stools 05/29/2015  . Anemia of chronic kidney failure 08/15/2014  . Shortness of breath   . Symptomatic anemia 06/11/2014  . 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 End-stage renal disease (ESRD) ([place]) 12/30/2012  . Secondary hyperparathyroidism ([place]) 12/30/2012  . Hypotension 06/16/2011  . Palpitations 06/16/2011  . CAD (coronary artery disease) of artery bypass graft 04/26/2009  . DYSPNEA ON EXERTION 03/04/2009  . HLD (hyperlipidemia) 03/03/2009  . GOUT 03/03/2009  . Obesity 03/03/2009  . Essential hypertension 03/03/2009    Past Medical History: Past Medical History:  Diagnosis Date  . Anemia of chronic disease   . Coronary artery disease   . Dialysis patient ([place])    T, TH, Sat  . Dyslipidemia   . ESRD (end stage renal disease) on dialysis [facility])    "[employer]rial; T, Thurs, Sat" (12/26/2012)  . Gout   . History of blood transfusion   . Hypertension   . Metastatic cancer to bone ([place]) dx'd 04/04/17  . MYOCARDIAL INFARCTION, ACUTE, NON-Q WAVE 04/26/2009   Qualifier: Diagnosis of  By: Percival Spanish, MD, Farrel Gordon    . Obesity   . Prostate CA ([place]) dx'd 12/2014   initial dx  . Type II diabetes mellitus ([place])    "diet controlled" (12/26/2012)    Past Surgical History: Past Surgical History:  Procedure Laterality Date  . ARTERIOVENOUS GRAFT PLACEMENT Right 12/2008   "upper arm"  . AV FISTULA PLACEMENT    . BIOPSY  07/09/2017   Procedure: BIOPSY;  Surgeon: Wonda Horner, MD;  Location: [facility] ENDOSCOPY;  Service: Endoscopy;;  . CARDIAC CATHETERIZATION  12/30/2012  . COLONOSCOPY W/ POLYPECTOMY    . CORONARY ARTERY BYPASS GRAFT N/A 12/31/2012   Procedure: CORONARY ARTERY BYPASS GRAFTING times four using left internal mammary and right saphenous vein;  Surgeon: Grace Isaac, MD;  Location: [place];  Service: Open Heart Surgery;  Laterality: N/A;  . ESOPHAGOGASTRODUODENOSCOPY (EGD) WITH PROPOFOL N/A 07/09/2017   Procedure:  ESOPHAGOGASTRODUODENOSCOPY (EGD) WITH PROPOFOL;  Surgeon: Wonda Horner, MD;  Location: [facility] ENDOSCOPY;  Service: Endoscopy;  Laterality: N/A;  . EYE SURGERY Bilateral    cataract  . INTRAOPERATIVE TRANSESOPHAGEAL ECHOCARDIOGRAM N/A 12/31/2012   Procedure: INTRAOPERATIVE TRANSESOPHAGEAL ECHOCARDIOGRAM;  Surgeon: Grace Isaac, MD;  Location: [place];  Service: Open Heart Surgery;  Laterality: N/A;  . LEFT HEART CATHETERIZATION WITH CORONARY ANGIOGRAM N/A 12/30/2012   Procedure: LEFT HEART CATHETERIZATION WITH CORONARY ANGIOGRAM;  Surgeon: Burnell Blanks, MD;  Location: [facility] CATH LAB;  Service: Cardiovascular;  Laterality: N/A;  . REVISON OF ARTERIOVENOUS FISTULA Right 02/06/2018   Procedure: REVISION PLICATION OF ARTERIOVENOUS FISTULA RIGHT ARM;  Surgeon: Waynetta Sandy, MD;  Location: [place];  Service: Vascular;  Laterality: Right;  . REVISON OF ARTERIOVENOUS FISTULA Right 04/03/2018   Procedure: REVISION OF ARTERIOVENOUS FISTULA RIGHT ARM;  Surgeon: Rosetta Posner, MD;  Location: [facility] OR;  Service: Vascular;  Laterality: Right;    Social History: Social History   Tobacco Use  . Smoking status: Former Smoker    Packs/day: 1.00    Years: 40.00  Pack years: 40.00    Types: Cigarettes    Quit date: 06/16/1995    Years since quitting: 24.3  . Smokeless tobacco: Never Used  Vaping Use  . Vaping Use: Never used  Substance Use Topics  . Alcohol use: No    Alcohol/week: 0.0 standard drinks  . Drug use: No   Additional social history:   Please also refer to relevant sections of EMR.  Family History: Family History  Problem Relation Age of Onset  . Stroke Mother   . Diabetes Sister      Allergies and Medications: No Known Allergies No current facility-administered medications on file prior to encounter.   Current Outpatient Medications on File Prior to Encounter  Medication Sig Dispense Refill  . amLODipine (NORVASC) 5 MG tablet TAKE 1 TABLET BY MOUTH DAILY 90  tablet 3  . aspirin EC 81 MG tablet Take 1 tablet (81 mg total) by mouth daily. (Patient taking differently: Take 81 mg by mouth every evening. ) 90 tablet 3  . atorvastatin (LIPITOR) 20 MG tablet Take 1 tablet (20 mg total) by mouth every evening. 90 tablet 3  . B Complex-C-Folic Acid (DIALYVITE 947) 0.8 MG TABS Take 0.8 mg by mouth every evening.   3  . cinacalcet (SENSIPAR) 60 MG tablet Take 60 mg by mouth every evening.     Marland Kitchen doxercalciferol (HECTOROL) 0.5 MCG capsule Doxercalciferol (Hectorol)    . enzalutamide (XTANDI) 40 MG capsule TAKE 4 CAPSULES (160 MG TOTAL) BY MOUTH DAILY. 120 capsule 0  . latanoprost (XALATAN) 0.005 % ophthalmic solution Place 1 drop into both eyes at bedtime.    Marland Kitchen leuprolide (LUPRON) 30 MG injection Inject 30 mg into the muscle every 4 (four) months.    . pantoprazole (PROTONIX) 40 MG tablet Take 1 tablet (40 mg total) by mouth daily. (Patient taking differently: Take 40 mg by mouth every evening. ) 30 tablet 0    Objective: BP (!) 120/44   Pulse 82   Temp 98.5 F (36.9 C) (Oral)   Resp (!) 24   SpO2 98%  Exam: General: Elderly, frail, NAD Eyes: PERRLA, EOMI Neck: Supple, no masses appreciated Cardiovascular: RRR, S1 and S2 present, no peripheral edema, 1+ peripheral dorsalis pedal pulses Respiratory: Normal WOB, no respiratory distress, normal breath sounds Gastrointestinal: Bowel sounds present, soft, nontender, nondistended Derm: Warm and dry Neuro: B/l LE 4+ strength (weaker on left), UE weak with 4+ strength, hand grip strength weaker on left, no slurred speech, no facial droop.  Oriented to self only (occasionally was oriented to place as well, but on repeated questioning patient would answer location incorrectly), able to follow commands  Labs and Imaging: CBC BMET  Recent Labs  Lab 10/01/19 0937  WBC 8.3  HGB 6.3*  HCT 21.0*  PLT 353   Recent Labs  Lab 10/01/19 0937  NA 134*  K 4.3  CL 89*  CO2 30  BUN 48*  CREATININE 5.02*   GLUCOSE 84  CALCIUM 8.6*     EKG:  sinus rhythm, and abnormal/inverted T waves in inferior lateral leads, unchanged from prior EKG in 2020   Lilland, Alana, DO 10/01/2019, 12:37 PM PGY-1, [place] [facility] pager: [phone], text pages welcome  Upper Level Addendum:  I have seen and evaluated this patient along with Dr. Oleh Genin and reviewed the above note, making necessary revisions.  Mina Marble, D.O. 10/01/2019, 6:51 PM PGY-3, [place]

## 2019-10-01 NOTE — Consult Note (Signed)
Referring Provider:  [facility] Primary Care Physician:  [name], MD Primary Gastroenterologist:  Dr. Watt Climes  Reason for Consultation: Anemia and heme positive stool  HPI: [name] [name] is a 86 y.o. male with end-stage renal disease being admitted through the emergency room because of weakness, heme positivity, and severe anemia with a roughly 2 g drop in hemoglobin over the past several weeks, to a current level of 6.3.    The patient has acute confusion.  Telephone conversation with his wife indicates that it sounds like this is a relatively recent development in the past week or 2.  For the past month or so, his appetite has been off and he has lost quite a bit of weight.  However, no complaint of abdominal pain or nausea.  Today, he was incontinent of dark stool.  He was Hemoccult positive in the ER.  The patient is known to my partner, Dr. Clarene Essex, who has performed multiple colonoscopies on him, most recently 5 years ago at which time a solitary diminutive adenoma was identified.  He has typically had several small adenomas on each of his previous several colonoscopies.    Upper endoscopy 2 years ago showed 2 duodenal ulcers.  The patient does take daily low-dose aspirin but is also on pantoprazole.   Past Medical History:  Diagnosis Date  . Anemia of chronic disease   . Coronary artery disease   . Dialysis patient ([place])    T, TH, Sat  . Dyslipidemia   . ESRD (end stage renal disease) on dialysis [facility])    "Industrial; T, Thurs, Sat" (12/26/2012)  . Gout   . History of blood transfusion   . Hypertension   . Metastatic cancer to bone ([place]) dx'd 04/04/17  . MYOCARDIAL INFARCTION, ACUTE, NON-Q WAVE 04/26/2009   Qualifier: Diagnosis of  By: Percival Spanish, MD, Farrel Gordon    . Obesity   . Prostate CA ([place]) dx'd 12/2014   initial dx  . Type II diabetes mellitus ([place])    "diet controlled" (12/26/2012)    Past Surgical History:  Procedure Laterality Date  .  ARTERIOVENOUS GRAFT PLACEMENT Right 12/2008   "upper arm"  . AV FISTULA PLACEMENT    . BIOPSY  07/09/2017   Procedure: BIOPSY;  Surgeon: Wonda Horner, MD;  Location: [facility] ENDOSCOPY;  Service: Endoscopy;;  . CARDIAC CATHETERIZATION  12/30/2012  . COLONOSCOPY W/ POLYPECTOMY    . CORONARY ARTERY BYPASS GRAFT N/A 12/31/2012   Procedure: CORONARY ARTERY BYPASS GRAFTING times four using left internal mammary and right saphenous vein;  Surgeon: Grace Isaac, MD;  Location: [place];  Service: Open Heart Surgery;  Laterality: N/A;  . ESOPHAGOGASTRODUODENOSCOPY (EGD) WITH PROPOFOL N/A 07/09/2017   Procedure: ESOPHAGOGASTRODUODENOSCOPY (EGD) WITH PROPOFOL;  Surgeon: Wonda Horner, MD;  Location[facility] ENDOSCOPY;  Service: Endoscopy;  Laterality: N/A;  . EYE SURGERY Bilateral    cataract  . INTRAOPERATIVE TRANSESOPHAGEAL ECHOCARDIOGRAM N/A 12/31/2012   Procedure: INTRAOPERATIVE TRANSESOPHAGEAL ECHOCARDIOGRAM;  Surgeon: Grace Isaac, MD;  Location: [place];  Service: Open Heart Surgery;  Laterality: N/A;  . LEFT HEART CATHETERIZATION WITH CORONARY ANGIOGRAM N/A 12/30/2012   Procedure: LEFT HEART CATHETERIZATION WITH CORONARY ANGIOGRAM;  Surgeon: Burnell Blanks, MD;  Location: [facility] CATH LAB;  Service: Cardiovascular;  Laterality: N/A;  . REVISON OF ARTERIOVENOUS FISTULA Right 02/06/2018   Procedure: REVISION PLICATION OF ARTERIOVENOUS FISTULA RIGHT ARM;  Surgeon: Waynetta Sandy, MD;  Location: [place];  Service: Vascular;  Laterality: Right;  . REVISON  OF ARTERIOVENOUS FISTULA Right 04/03/2018   Procedure: REVISION OF ARTERIOVENOUS FISTULA RIGHT ARM;  Surgeon: Rosetta Posner, MD;  Location: [facility] OR;  Service: Vascular;  Laterality: Right;    Prior to Admission medications   Medication Sig Start Date End Date Taking? Authorizing Provider  amLODipine (NORVASC) 5 MG tablet TAKE 1 TABLET BY MOUTH DAILY 12/25/18   Minus Breeding, MD  aspirin EC 81 MG tablet Take 1 tablet (81 mg total) by mouth  daily. Patient taking differently: Take 81 mg by mouth every evening.  05/07/17   Almyra Deforest, PA  atorvastatin (LIPITOR) 20 MG tablet Take 1 tablet (20 mg total) by mouth every evening. 12/25/18   Minus Breeding, MD  B Complex-C-Folic Acid (DIALYVITE 008) 0.8 MG TABS Take 0.8 mg by mouth every evening.  08/07/17   [provider]  cinacalcet (SENSIPAR) 60 MG tablet Take 60 mg by mouth every evening.     [provider]  doxercalciferol (HECTOROL) 0.5 MCG capsule Doxercalciferol (Hectorol) 11/12/18 11/11/19  [provider]  enzalutamide (XTANDI) 40 MG capsule TAKE 4 CAPSULES (160 MG TOTAL) BY MOUTH DAILY. 09/30/19   Curt Bears, MD  latanoprost (XALATAN) 0.005 % ophthalmic solution Place 1 drop into both eyes at bedtime.    [provider]  leuprolide (LUPRON) 30 MG injection Inject 30 mg into the muscle every 4 (four) months.    [provider]  pantoprazole (PROTONIX) 40 MG tablet Take 1 tablet (40 mg total) by mouth daily. Patient taking differently: Take 40 mg by mouth every evening.  07/06/18   Lajean Saver, MD    Current Facility-Administered Medications  Medication Dose Route Frequency Provider Last Rate Last Admin  . 0.9 %  sodium chloride infusion (Manually program via Guardrails IV Fluids)   Intravenous Once Dorie Rank, MD      . 0.9 %  sodium chloride infusion  100 mL/hr Intravenous Continuous Dorie Rank, MD 100 mL/hr at 10/01/19 1117 100 mL/hr at 10/01/19 1117   Current Outpatient Medications  Medication Sig Dispense Refill  . amLODipine (NORVASC) 5 MG tablet TAKE 1 TABLET BY MOUTH DAILY 90 tablet 3  . aspirin EC 81 MG tablet Take 1 tablet (81 mg total) by mouth daily. (Patient taking differently: Take 81 mg by mouth every evening. ) 90 tablet 3  . atorvastatin (LIPITOR) 20 MG tablet Take 1 tablet (20 mg total) by mouth every evening. 90 tablet 3  . B Complex-C-Folic Acid (DIALYVITE 676) 0.8 MG TABS Take 0.8 mg by mouth every evening.    3  . cinacalcet (SENSIPAR) 60 MG tablet Take 60 mg by mouth every evening.     Marland Kitchen doxercalciferol (HECTOROL) 0.5 MCG capsule Doxercalciferol (Hectorol)    . enzalutamide (XTANDI) 40 MG capsule TAKE 4 CAPSULES (160 MG TOTAL) BY MOUTH DAILY. 120 capsule 0  . latanoprost (XALATAN) 0.005 % ophthalmic solution Place 1 drop into both eyes at bedtime.    Marland Kitchen leuprolide (LUPRON) 30 MG injection Inject 30 mg into the muscle every 4 (four) months.    . pantoprazole (PROTONIX) 40 MG tablet Take 1 tablet (40 mg total) by mouth daily. (Patient taking differently: Take 40 mg by mouth every evening. ) 30 tablet 0    Allergies as of 10/01/2019  . (No Known Allergies)    Family History  Problem Relation Age of Onset  . Stroke Mother   . Diabetes Sister     Social History   Socioeconomic History  . Marital status: Married  Spouse name: Not on file  . Number of children: 2  . Years of education: Not on file  . Highest education level: Not on file  Occupational History  . Occupation: retired    Comment: truck Geophysicist/field seismologist  Tobacco Use  . Smoking status: Former Smoker    Packs/day: 1.00    Years: 40.00    Pack years: 40.00    Types: Cigarettes    Quit date: 06/16/1995    Years since quitting: 24.3  . Smokeless tobacco: Never Used  Vaping Use  . Vaping Use: Never used  Substance and Sexual Activity  . Alcohol use: No    Alcohol/week: 0.0 standard drinks  . Drug use: No  . Sexual activity: Not Currently  Other Topics Concern  . Not on file  Social History Narrative   He is a retired Administrator. He lives in [place] with his wife. He has two sons   Social Determinants of Radio broadcast assistant Strain:   . Difficulty of Paying Living Expenses: Not on file  Food Insecurity:   . Worried About Charity fundraiser in the Last Year: Not on file  . Ran Out of Food in the Last Year: Not on file  Transportation Needs:   . Lack of Transportation (Medical): Not on file  . Lack of  Transportation (Non-Medical): Not on file  Physical Activity:   . Days of Exercise per Week: Not on file  . Minutes of Exercise per Session: Not on file  Stress:   . Feeling of Stress : Not on file  Social Connections:   . Frequency of Communication with Friends and Family: Not on file  . Frequency of Social Gatherings with Friends and Family: Not on file  . Attends Religious Services: Not on file  . Active Member of Clubs or Organizations: Not on file  . Attends Archivist Meetings: Not on file  . Marital Status: Not on file  Intimate Partner Violence:   . Fear of Current or Ex-Partner: Not on file  . Emotionally Abused: Not on file  . Physically Abused: Not on file  . Sexually Abused: Not on file    Review of Systems: See HPI  Physical Exam: Vital signs in last 24 hours: Temp:  [98.5 F (36.9 C)] 98.5 F (36.9 C) (09/22 0911) Pulse Rate:  [82] 82 (09/22 0907) Resp:  [16-24] 24 (09/22 1130) BP: (120-126)/(43-44) 120/44 (09/22 1130) SpO2:  [98 %] 98 % (09/22 0907)   Pleasant, thin, somewhat chronically ill-appearing elderly African-American male lying on the ER stretcher in no distress, awake and alert.  Chest clear, heart without arrhythmia but perhaps systolic murmur present.  Abdomen without overt mass-effect or tenderness.  No lower extremity edema.  No obvious focal neurologic deficits.  Intake/Output from previous day: No intake/output data recorded. Intake/Output this shift: No intake/output data recorded.  Lab Results: Recent Labs    10/01/19 0937  WBC 8.3  HGB 6.3*  HCT 21.0*  PLT 353   BMET Recent Labs    10/01/19 0937  NA 134*  K 4.3  CL 89*  CO2 30  GLUCOSE 84  BUN 48*  CREATININE 5.02*  CALCIUM 8.6*   LFT Recent Labs    10/01/19 0937  PROT 7.6  ALBUMIN 2.2*  AST 45*  ALT 14  ALKPHOS 48  BILITOT 0.5   PT/INR Recent Labs    10/01/19 0937  LABPROT 16.9*  INR 1.4*    Studies/Results: CT HEAD WO  CONTRAST  Result  Date: 10/01/2019 CLINICAL DATA:  Altered mental status EXAM: CT HEAD WITHOUT CONTRAST TECHNIQUE: Contiguous axial images were obtained from the base of the skull through the vertex without intravenous contrast. COMPARISON:  None. FINDINGS: Brain: There is atrophy and chronic small vessel disease changes. No acute intracranial abnormality. Specifically, no hemorrhage, hydrocephalus, mass lesion, acute infarction, or significant intracranial injury. Vascular: No hyperdense vessel or unexpected calcification. Skull: No acute calvarial abnormality. Sinuses/Orbits: Near complete opacification of the left paranasal sinuses. Mucosal thickening throughout the right paranasal sinuses. Mastoid air cells clear. Other: None IMPRESSION: Atrophy, chronic microvascular disease. No acute intracranial abnormality. Chronic sinusitis. Electronically Signed   By: Rolm Baptise M.D.   On: 10/01/2019 10:15   DG Chest Portable 1 View  Result Date: 10/01/2019 CLINICAL DATA:  Weakness. Additional provided: Patient reports weakness and lethargy since returning from dialysis yesterday. EXAM: PORTABLE CHEST 1 VIEW COMPARISON:  Prior chest radiograph 07/06/2017 and earlier. FINDINGS: Prior median sternotomy and CABG. Stable, mild cardiomegaly. Aortic atherosclerosis. No appreciable airspace consolidation or pulmonary edema. No evidence of pleural effusion or pneumothorax. No acute bony abnormality identified. IMPRESSION: No evidence of acute cardiopulmonary abnormality. Stable mild cardiomegaly status post CABG. Aortic Atherosclerosis (ICD10-I70.0). Electronically Signed   By: Kellie Simmering DO   On: 10/01/2019 09:37    Impression: 1.  Subacute GI bleeding, characterized by 2 g drop in hemoglobin over the past 3 weeks, with Hemoccult positivity 2.  History of duodenal ulcers, on aspirin and pantoprazole 3.  Recent upper tract symptoms (anorexia, weight loss) 4.  History of multiple small colonic adenomas removed over the past 15 years,  up-to-date on surveillance with most recent colonoscopy 5 years ago 5.  Recent confusion and decreased functional status, per conversation with wife 6.  End-stage renal disease, on dialysis   Plan: Endoscopic evaluation tomorrow morning.  Purpose and risks reviewed with the patient's wife by telephone, and she is agreeable.  It seems unlikely that this patient would benefit from colonoscopic evaluation in this clinical setting, especially taking into account his advanced age and medical comorbidities   LOS: 0 days   Youlanda Mighty Ketrick Matney  10/01/2019, 12:57 PM   Pager [phone] If no answer or after 5 PM call [phone]

## 2019-10-01 NOTE — ED Notes (Signed)
Had blood ready for administration and drawn from blood bank. Orders had been cancelled. Had two other RN's check behind me and verify that the orders for blood had been cancelled. Do not know who cancelled. Paged attending MD

## 2019-10-01 NOTE — ED Notes (Signed)
Visually checked on Pt and he stated that he was doing Fine

## 2019-10-01 NOTE — ED Notes (Signed)
Pt complasin of intermitent chest pain of 8. EKG capture and Exported. MD Notified.

## 2019-10-01 NOTE — ED Notes (Signed)
Got patient into a gown on the monitor did ekg shown to er doctor patient is resting with call bell in reach

## 2019-10-01 NOTE — ED Notes (Signed)
Received phone call from answering service telling me that the wrong group had been paged and the Pt was under family services care. I then had Family services paged

## 2019-10-01 NOTE — ED Notes (Signed)
Attending MD's and GI MD in with Pt

## 2019-10-02 ENCOUNTER — Other Ambulatory Visit: Payer: Self-pay | Admitting: Pharmacist

## 2019-10-02 DIAGNOSIS — C61 Malignant neoplasm of prostate: Secondary | ICD-10-CM

## 2019-10-02 LAB — RENAL FUNCTION PANEL
Albumin: 1.9 g/dL — ABNORMAL LOW (ref 3.5–5.0)
Anion gap: 18 — ABNORMAL HIGH (ref 5–15)
BUN: 67 mg/dL — ABNORMAL HIGH (ref 8–23)
CO2: 22 mmol/L (ref 22–32)
Calcium: 7.7 mg/dL — ABNORMAL LOW (ref 8.9–10.3)
Chloride: 92 mmol/L — ABNORMAL LOW (ref 98–111)
Creatinine, Ser: 6.18 mg/dL — ABNORMAL HIGH (ref 0.61–1.24)
GFR calc Af Amer: 9 mL/min — ABNORMAL LOW (ref >60)
GFR calc non Af Amer: 7 mL/min — ABNORMAL LOW (ref >60)
Glucose, Bld: 96 mg/dL (ref 70–99)
Phosphorus: 4.1 mg/dL (ref 2.5–4.6)
Potassium: 4.7 mmol/L (ref 3.5–5.1)
Sodium: 132 mmol/L — ABNORMAL LOW (ref 135–145)

## 2019-10-02 LAB — CBC
HCT: 22.9 % — ABNORMAL LOW (ref 39.0–52.0)
Hemoglobin: 7.4 g/dL — ABNORMAL LOW (ref 13.0–17.0)
MCH: 26.1 pg (ref 26.0–34.0)
MCHC: 32.3 g/dL (ref 30.0–36.0)
MCV: 80.9 fL (ref 80.0–100.0)
Platelets: 302 10*3/uL (ref 150–400)
RBC: 2.83 MIL/uL — ABNORMAL LOW (ref 4.22–5.81)
RDW: 17.1 % — ABNORMAL HIGH (ref 11.5–15.5)
WBC: 7.9 10*3/uL (ref 4.0–10.5)
nRBC: 0 % (ref 0.0–0.2)

## 2019-10-02 LAB — TROPONIN I (HIGH SENSITIVITY)
Troponin I (High Sensitivity): 14481 ng/L (ref <18)
Troponin I (High Sensitivity): 14617 ng/L (ref <18)

## 2019-10-02 LAB — AMMONIA: Ammonia: 20 umol/L (ref 9–35)

## 2019-10-02 LAB — PREPARE RBC (CROSSMATCH)

## 2019-10-02 LAB — PROCALCITONIN: Procalcitonin: 0.74 ng/mL

## 2019-10-02 LAB — TSH: TSH: 2.345 u[IU]/mL (ref 0.350–4.500)

## 2019-10-02 MED ORDER — VANCOMYCIN HCL 1500 MG/300ML IV SOLN
1500.0000 mg | Freq: Once | INTRAVENOUS | Status: AC
  Administered 2019-10-02: 1500 mg via INTRAVENOUS
  Filled 2019-10-02: qty 300

## 2019-10-02 MED ORDER — VANCOMYCIN HCL IN DEXTROSE 750-5 MG/150ML-% IV SOLN
750.0000 mg | INTRAVENOUS | Status: DC
  Filled 2019-10-02: qty 150

## 2019-10-02 MED ORDER — XTANDI 40 MG PO CAPS: ORAL_CAPSULE | ORAL | 0 refills | Status: DC

## 2019-10-02 MED ORDER — SODIUM CHLORIDE 0.9% IV SOLUTION: Freq: Once | INTRAVENOUS | Status: AC

## 2019-10-02 MED ORDER — PIPERACILLIN-TAZOBACTAM IN DEX 2-0.25 GM/50ML IV SOLN
2.2500 g | Freq: Three times a day (TID) | INTRAVENOUS | Status: DC
  Administered 2019-10-02 – 2019-10-04 (×6): 2.25 g via INTRAVENOUS
  Filled 2019-10-02 (×8): qty 50

## 2019-10-02 MED FILL — XTANDI 40 MG CAPSULE: 40 | 30 days supply | Qty: 120 | Fill #0

## 2019-10-02 NOTE — Progress Notes (Signed)
PT Cancellation Note  Patient Details Name: [name] [name] MRN: [id number] DOB: [birth year]   Cancelled Treatment:    Reason Eval/Treat Not Completed: Medical issues which prohibited therapy - Pt awaiting transfusion, and has increasing troponins this day. PT to check back tomorrow.    [facility] Pager [phone]  Office [phone]     Julien Girt 10/02/2019, 4:03 PM

## 2019-10-02 NOTE — Progress Notes (Signed)
Oral Oncology Pharmacist Encounter  Prescription refill for Xtandi (enzalutamide) sent to [facility] in error. Patient fills Xtandi at [facility] - prescription redirected to [facility].  Leron Croak, PharmD, BCPS Hematology/Oncology Clinical Pharmacist [facility] [phone] 10/02/2019 1:47 PM

## 2019-10-02 NOTE — Progress Notes (Signed)
Patient is now back from hemodialysis and is lying in bed, alert, not really conversant, no evident distress.  Conversation with his nurse indicates no bowel movements or evidence of bleeding since admission.  I will allow the patient for renal diet today and then n.p.o. after midnight in anticipation of upper endoscopy tomorrow morning.  Cleotis Nipper, M.D. Pager [phone] If no answer or after 5 PM call [phone]

## 2019-10-02 NOTE — Progress Notes (Signed)
Patient wife informed that she needs to bring [place] from home. She will bring it tomorrow.

## 2019-10-02 NOTE — Consult Note (Signed)
Renal Service Consult Note [facility]  [name] [name] 10/02/2019 [name] [name] Requesting Physician:  Dr [name] [name], [name] [name].   Reason for Consult:  ESRD pt w/ anemia HPI: The patient is a 86 y.o. year-old w/ hx of DM2, prostate Ca, MI, HTN, gout, ESRD on HD, HL and CAD admitted w/ AMS, gen weakness and confusion. Found to be anemic, Hb 6's. Admitted for w/u.  Asked to see for ESRD.   Pt is poorly responsive, answers simple questions, Ox 1 only.  No c/o.   ROS  denies CP  no joint pain   no HA  no blurry vision  no rash  no diarrhea  no nausea/ vomiting     Past Medical History  Past Medical History:  Diagnosis Date  . Anemia of chronic disease   . Coronary artery disease   . Dialysis patient ([place])    T, TH, Sat  . Dyslipidemia   . ESRD (end stage renal disease) on dialysis [facility])    "Industrial; T, Thurs, Sat" (12/26/2012)  . Gout   . History of blood transfusion   . Hypertension   . Metastatic cancer to bone ([place]) dx'd 04/04/17  . MYOCARDIAL INFARCTION, ACUTE, NON-Q WAVE 04/26/2009   Qualifier: Diagnosis of  By: [name] Spanish, MD, [name] [name]    . Obesity   . Prostate CA ([place]) dx'd 12/2014   initial dx  . Type II diabetes mellitus ([place])    "diet controlled" (12/26/2012)   Past Surgical History  Past Surgical History:  Procedure Laterality Date  . ARTERIOVENOUS GRAFT PLACEMENT Right 12/2008   "upper arm"  . AV FISTULA PLACEMENT    . BIOPSY  07/09/2017   Procedure: BIOPSY;  Surgeon: [name] Horner, MD;  Location: [facility] ENDOSCOPY;  Service: Endoscopy;;  . CARDIAC CATHETERIZATION  12/30/2012  . COLONOSCOPY W/ POLYPECTOMY    . CORONARY ARTERY BYPASS GRAFT N/A 12/31/2012   Procedure: CORONARY ARTERY BYPASS GRAFTING times four using left internal mammary and right saphenous vein;  Surgeon: [name] Isaac, MD;  Location: [place];  Service: Open Heart Surgery;  Laterality: N/A;  . ESOPHAGOGASTRODUODENOSCOPY (EGD) WITH PROPOFOL N/A 07/09/2017   Procedure:  ESOPHAGOGASTRODUODENOSCOPY (EGD) WITH PROPOFOL;  Surgeon: [name] Horner, MD;  Location: [facility] ENDOSCOPY;  Service: Endoscopy;  Laterality: N/A;  . EYE SURGERY Bilateral    cataract  . INTRAOPERATIVE TRANSESOPHAGEAL ECHOCARDIOGRAM N/A 12/31/2012   Procedure: INTRAOPERATIVE TRANSESOPHAGEAL ECHOCARDIOGRAM;  Surgeon: [name] Isaac, MD;  Location: [place];  Service: Open Heart Surgery;  Laterality: N/A;  . LEFT HEART CATHETERIZATION WITH CORONARY ANGIOGRAM N/A 12/30/2012   Procedure: LEFT HEART CATHETERIZATION WITH CORONARY ANGIOGRAM;  Surgeon: [name] Blanks, MD;  Location: [facility] CATH LAB;  Service: Cardiovascular;  Laterality: N/A;  . REVISON OF ARTERIOVENOUS FISTULA Right 02/06/2018   Procedure: REVISION PLICATION OF ARTERIOVENOUS FISTULA RIGHT ARM;  Surgeon: [name] Sandy, MD;  Location: [facility] OR;  Service: Vascular;  Laterality: Right;  . REVISON OF ARTERIOVENOUS FISTULA Right 04/03/2018   Procedure: REVISION OF ARTERIOVENOUS FISTULA RIGHT ARM;  Surgeon: [name] Posner, MD;  Location: [facility] OR;  Service: Vascular;  Laterality: Right;   Family History  Family History  Problem Relation Age of Onset  . Stroke Mother   . Diabetes Sister    Social History  reports that he quit smoking about 24 years ago. His smoking use included cigarettes. He has a 40.00 pack-year smoking history. He has never used smokeless tobacco. He reports that he does not drink alcohol and does  not use drugs. Allergies No Known Allergies Home medications Prior to Admission medications   Medication Sig Start Date End Date Taking? Authorizing Provider  amLODipine (NORVASC) 5 MG tablet TAKE 1 TABLET BY MOUTH DAILY Patient taking differently: Take 5 mg by mouth daily.  12/25/18  Yes [name] Breeding, MD  aspirin EC 81 MG tablet Take 1 tablet (81 mg total) by mouth daily. Patient taking differently: Take 81 mg by mouth every evening.  05/07/17  Yes [name] Deforest, PA  atorvastatin (LIPITOR) 20 MG tablet Take 1 tablet (20 mg  total) by mouth every evening. 12/25/18  Yes [name] Breeding, MD  B Complex-C-Folic Acid (DIALYVITE 540) 0.8 MG TABS Take 0.8 mg by mouth every evening.  08/07/17  Yes [provider]  cinacalcet (SENSIPAR) 60 MG tablet Take 60 mg by mouth every evening.    Yes [provider]  enzalutamide (XTANDI) 40 MG capsule TAKE 4 CAPSULES (160 MG TOTAL) BY MOUTH DAILY. Patient taking differently: Take 160 mg by mouth daily. Takes at 3pm 09/30/19  Yes Mohamed, Julien Nordmann, MD  latanoprost (XALATAN) 0.005 % ophthalmic solution Place 1 drop into both eyes at bedtime.   Yes [provider]  leuprolide (LUPRON) 30 MG injection Inject 30 mg into the muscle every 4 (four) months.   Yes [provider]  pantoprazole (PROTONIX) 40 MG tablet Take 1 tablet (40 mg total) by mouth daily. Patient taking differently: Take 40 mg by mouth every evening.  07/06/18  Yes [name] Saver, MD     Vitals:   10/02/19 1000 10/02/19 1030 10/02/19 1045 10/02/19 1053  BP: 121/61 138/88 130/67 (!) 128/55  Pulse: 85 89 86 86  Resp: (!) 23 16 17 18   Temp:    99.7 F (37.6 C)  TempSrc:    Oral  SpO2:    100%  Weight:    69.1 kg   Exam Gen edlelry AAM, Ox 1, very hoh and verbalizes minimally and is not oriented to date, time , day, month, year, president, place No rash, cyanosis or gangrene Sclera anicteric, throat clear  No jvd or bruits Chest clear bilat to bases RRR no MRG Abd soft ntnd no mass or ascites +bs GU normal male defer MS no joint effusions or deformity Ext no LE or UE edema, no wounds or ulcers Neuro is awake, responsive, as above, nonfocal, mild gen weakness    Home meds:  - norvasc 5/ asa 81/ lipitor 20  - enzalutamide 1600 qd/ lupron 30mg  IM q 4 mo  - protonix 40 hs/ sensipar 60 hs  - prn's/ vitamins/ supplements    OP HD: TTS   4h  69.5kg  Hep none 2/2.25 bath  R AVF - good compliance, getting to edw - sp IV Fe load  - mircera 225 q2 last 9/14, due 9/28  - hect 7  ug tiw  - 9/21- Hb 6.4, has been < 7.6 all september   Assessment/ Plan: 1. Anemia - due to CKD +/- GIB. W/u per primary. GI consulting. 2u prbcs 2. ESRD - on HD TTS. HD today.  3. BP /vol - BP is stable , UF 2 L on HD as tol 4. AMS - has underlying pretty severe dementia 5. HTN - cont norvasc 6. H/o CAD/ MI  7. Anemia of ckd - getting max esa w/ OP HH and just finished 1 gm IV fe load. Question of poss underlying malignancy.  8. Prostate cancer - w/ bony mets 9. Dementia  Kelly Splinter  MD 10/02/2019, 12:10 PM  Recent Labs  Lab 10/01/19 0937 10/02/19 0703  WBC 8.3 7.9  HGB 6.3* 7.4*   Recent Labs  Lab 10/01/19 0937 10/02/19 0703  K 4.3 4.7  BUN 48* 67*  CREATININE 5.02* 6.18*  CALCIUM 8.6* 7.7*  PHOS  --  4.1

## 2019-10-02 NOTE — Progress Notes (Signed)
Patient currently in hemodialysis.  Chart reviewed.  No mention of further bleeding.  Patient is receiving 2 units of packed cells today, as well as dialysis.  Given his age and frailty, I think (in the absence of evidence of acute, active bleeding) it would be best to defer upper endoscopy until tomorrow, to allow for better fluid equilibration and hemodynamic stability before undergoing sedation.  Therefore, he is currently rescheduled to 8:00 tomorrow morning for upper endoscopy.  I have spoken with the patient's wife to let her know this.  Please call me if you have any questions.  Cleotis Nipper, M.D. Pager [phone] If no answer or after 5 PM call [phone]

## 2019-10-02 NOTE — Progress Notes (Signed)
OT Cancellation Note  Patient Details Name: [name] [name] MRN: [id number] DOB: [birth year]   Cancelled Treatment:    Reason Eval/Treat Not Completed: Patient at procedure or test/ unavailable. HD. Will attempt later time.  Delesa Kawa,HILLARY 10/02/2019, 7:45 AM  Maurie Boettcher, OT/L   Acute OT Clinical Specialist Acute Rehabilitation Services Pager [phone] Office [phone]

## 2019-10-02 NOTE — Progress Notes (Signed)
Patient has labs that haven't been completed yet. Patient received 2 units of blood this shift, during that time they were not able to obtain labs. Spoke with dialysis RN and all due labs will be collected in Dialysis.

## 2019-10-02 NOTE — Consult Note (Addendum)
Cardiology Consultation:   Patient ID: [name] [name] MRN: [id number]; DOB: 11-29-1930  Admit date: 10/01/2019 Date of Consult: 10/02/2019   Primary Care Provider: [name], MD  [facility] Cardiologist: Minus Breeding, MD [facility] Electrophysiologist:  None   Patient Profile:   [name] [name] is a 86 y.o. male with a hx of CAD status post CABG (LIMA to LAD, SVG to intermediate, SVG to OM, SVG to posterior descending artery), ESRD on HD, hypertension, prostate CA, diabetes, anemia of chronic disease who is being seen today for the evaluation of elevated troponin at the request of Dr. Nori Riis.   History of Present Illness:   [name] [name] is an 86 year old male with past medical history noted above.  He underwent CABG in 2014 with grafts noted above.  He was seen in the office on 04/2017 for preoperative clearance in regards to a parathyroidectomy.  He had a [employer] which showed a small size, reversible defect in the basal and mid inferior wall consistent with prior infarct, no ischemia.  LVEF was 50%.  He was seen back in the office on 10/2018 with Dr. Percival Spanish and reported he was doing well at that time.  Of note he has chronic anemia.  He has been seen several times in the ER and required blood transfusions.  He has been seen by hematology/oncology and this is felt to be related to his renal disease with may be some myelodysplasia.  At this office visit he stated he was still mowing his lawn with a push mower, and not experiencing any shortness of breath.  He presented to the ED on 9/22 with weakness and lethargy after receiving dialysis.  Reports 1 week of altered mental status and confusion per his wife.  Notes indicate over the past month he had had poor p.o. intake in a weight loss of around 6 pounds.  Labs in the ED showed sodium 134, creatinine 5.02, albumin 2.2, WBC 8.3, hemoglobin 6.3, guaiac positive.  CT head with no acute findings.  He was evaluated by Dr. Cristina Gong with  GI with plans to undergo endoscopic evaluation.  Transfused 2 units of PRBCs. He was admitted to family medicine for further management.   High-sensitivity troponin was checked and noted at 162 with repeat of 14,481.  EKG on admission showed sinus rhythm with T wave inversion in inferior lateral leads similar to prior tracings.   Past Medical History:  Diagnosis Date  . Anemia of chronic disease   . Coronary artery disease   . Dialysis patient ([place])    T, TH, Sat  . Dyslipidemia   . ESRD (end stage renal disease) on dialysis [facility])    "[employer]; T, Thurs, Sat" (12/26/2012)  . Gout   . History of blood transfusion   . Hypertension   . Metastatic cancer to bone ([place]) dx'd 04/04/17  . MYOCARDIAL INFARCTION, ACUTE, NON-Q WAVE 04/26/2009   Qualifier: Diagnosis of  By: Percival Spanish, MD, Farrel Gordon    . Obesity   . Prostate CA ([place]) dx'd 12/2014   initial dx  . Type II diabetes mellitus ([place])    "diet controlled" (12/26/2012)    Past Surgical History:  Procedure Laterality Date  . ARTERIOVENOUS GRAFT PLACEMENT Right 12/2008   "upper arm"  . AV FISTULA PLACEMENT    . BIOPSY  07/09/2017   Procedure: BIOPSY;  Surgeon: Wonda Horner, MD;  Location: [facility] ENDOSCOPY;  Service: Endoscopy;;  . CARDIAC CATHETERIZATION  12/30/2012  . COLONOSCOPY W/ POLYPECTOMY    .  CORONARY ARTERY BYPASS GRAFT N/A 12/31/2012   Procedure: CORONARY ARTERY BYPASS GRAFTING times four using left internal mammary and right saphenous vein;  Surgeon: Grace Isaac, MD;  Location: [place];  Service: Open Heart Surgery;  Laterality: N/A;  . ESOPHAGOGASTRODUODENOSCOPY (EGD) WITH PROPOFOL N/A 07/09/2017   Procedure: ESOPHAGOGASTRODUODENOSCOPY (EGD) WITH PROPOFOL;  Surgeon: Wonda Horner, MD;  Location: [facility] ENDOSCOPY;  Service: Endoscopy;  Laterality: N/A;  . EYE SURGERY Bilateral    cataract  . INTRAOPERATIVE TRANSESOPHAGEAL ECHOCARDIOGRAM N/A 12/31/2012   Procedure: INTRAOPERATIVE TRANSESOPHAGEAL ECHOCARDIOGRAM;  Surgeon:  Grace Isaac, MD;  Location: [place];  Service: Open Heart Surgery;  Laterality: N/A;  . LEFT HEART CATHETERIZATION WITH CORONARY ANGIOGRAM N/A 12/30/2012   Procedure: LEFT HEART CATHETERIZATION WITH CORONARY ANGIOGRAM;  Surgeon: Burnell Blanks, MD;  Location: [facility] CATH LAB;  Service: Cardiovascular;  Laterality: N/A;  . REVISON OF ARTERIOVENOUS FISTULA Right 02/06/2018   Procedure: REVISION PLICATION OF ARTERIOVENOUS FISTULA RIGHT ARM;  Surgeon: Waynetta Sandy, MD;  Location: [place];  Service: Vascular;  Laterality: Right;  . REVISON OF ARTERIOVENOUS FISTULA Right 04/03/2018   Procedure: REVISION OF ARTERIOVENOUS FISTULA RIGHT ARM;  Surgeon: Rosetta Posner, MD;  Location: [facility] OR;  Service: Vascular;  Laterality: Right;     Home Medications:  Prior to Admission medications   Medication Sig Start Date End Date Taking? Authorizing Provider  amLODipine (NORVASC) 5 MG tablet TAKE 1 TABLET BY MOUTH DAILY Patient taking differently: Take 5 mg by mouth daily.  12/25/18  Yes Minus Breeding, MD  aspirin EC 81 MG tablet Take 1 tablet (81 mg total) by mouth daily. Patient taking differently: Take 81 mg by mouth every evening.  05/07/17  Yes Almyra Deforest, PA  atorvastatin (LIPITOR) 20 MG tablet Take 1 tablet (20 mg total) by mouth every evening. 12/25/18  Yes Minus Breeding, MD  B Complex-C-Folic Acid (DIALYVITE 035) 0.8 MG TABS Take 0.8 mg by mouth every evening.  08/07/17  Yes [provider]  cinacalcet (SENSIPAR) 60 MG tablet Take 60 mg by mouth every evening.    Yes [provider]  enzalutamide (XTANDI) 40 MG capsule TAKE 4 CAPSULES (160 MG TOTAL) BY MOUTH DAILY. Patient taking differently: Take 160 mg by mouth daily. Takes at 3pm 09/30/19  Yes Mohamed, Julien Nordmann, MD  latanoprost (XALATAN) 0.005 % ophthalmic solution Place 1 drop into both eyes at bedtime.   Yes [provider]  leuprolide (LUPRON) 30 MG injection Inject 30 mg into the muscle every 4 (four) months.    Yes [provider]  pantoprazole (PROTONIX) 40 MG tablet Take 1 tablet (40 mg total) by mouth daily. Patient taking differently: Take 40 mg by mouth every evening.  07/06/18  Yes Lajean Saver, MD    Inpatient Medications: Scheduled Meds: . Chlorhexidine Gluconate Cloth  6 each Topical Q0600  . enzalutamide  160 mg Oral Daily  . pantoprazole (PROTONIX) IV  40 mg Intravenous Q12H  . sodium chloride flush  3 mL Intravenous Q12H  . sodium chloride flush  3 mL Intravenous Q12H   Continuous Infusions: . sodium chloride    . sodium chloride     PRN Meds: sodium chloride, acetaminophen **OR** acetaminophen, sodium chloride flush  Allergies:   No Known Allergies  Social History:   Social History   Socioeconomic History  . Marital status: Married    Spouse name: Not on file  . Number of children: 2  . Years of education: Not on file  .  Highest education level: Not on file  Occupational History  . Occupation: retired    Comment: truck Geophysicist/field seismologist  Tobacco Use  . Smoking status: Former Smoker    Packs/day: 1.00    Years: 40.00    Pack years: 40.00    Types: Cigarettes    Quit date: 06/16/1995    Years since quitting: 24.3  . Smokeless tobacco: Never Used  Vaping Use  . Vaping Use: Never used  Substance and Sexual Activity  . Alcohol use: No    Alcohol/week: 0.0 standard drinks  . Drug use: No  . Sexual activity: Not Currently  Other Topics Concern  . Not on file  Social History Narrative   He is a retired Administrator. He lives in [place] with his wife. He has two sons   Social Determinants of Radio broadcast assistant Strain:   . Difficulty of Paying Living Expenses: Not on file  Food Insecurity:   . Worried About Charity fundraiser in the Last Year: Not on file  . Ran Out of Food in the Last Year: Not on file  Transportation Needs:   . Lack of Transportation (Medical): Not on file  . Lack of Transportation (Non-Medical): Not on file  Physical  Activity:   . Days of Exercise per Week: Not on file  . Minutes of Exercise per Session: Not on file  Stress:   . Feeling of Stress : Not on file  Social Connections:   . Frequency of Communication with Friends and Family: Not on file  . Frequency of Social Gatherings with Friends and Family: Not on file  . Attends Religious Services: Not on file  . Active Member of Clubs or Organizations: Not on file  . Attends Archivist Meetings: Not on file  . Marital Status: Not on file  Intimate Partner Violence:   . Fear of Current or Ex-Partner: Not on file  . Emotionally Abused: Not on file  . Physically Abused: Not on file  . Sexually Abused: Not on file    Family History:   Family History  Problem Relation Age of Onset  . Stroke Mother   . Diabetes Sister      ROS:  Please see the history of present illness.   All other ROS reviewed and negative.     Physical Exam/Data:   Vitals:   10/02/19 1000 10/02/19 1030 10/02/19 1045 10/02/19 1053  BP: 121/61 138/88 130/67 (!) 128/55  Pulse: 85 89 86 86  Resp: (!) 23 16 17 18   Temp:    99.7 F (37.6 C)  TempSrc:    Oral  SpO2:    100%  Weight:    69.1 kg    Intake/Output Summary (Last 24 hours) at 10/02/2019 1312 Last data filed at 10/02/2019 1053 Gross per 24 hour  Intake 377.08 ml  Output 2000 ml  Net -1622.92 ml   Last 3 Weights 10/02/2019 10/02/2019 10/02/2019  Weight (lbs) 152 lb 5.4 oz 157 lb 13.6 oz 155 lb 3.3 oz  Weight (kg) 69.1 kg 71.6 kg 70.4 kg     Body mass index is 20.66 kg/m.  General:  Frail older AAM, ill appearing HEENT: normal Lymph: no adenopathy Neck: no JVD Endocrine:  No thryomegaly Vascular: No carotid bruits Cardiac:  normal S1, S2; RRR; no murmur  Lungs:  clear to auscultation bilaterally, no wheezing, rhonchi or rales  Abd: soft, nontender, no hepatomegaly  Ext: no edema Musculoskeletal:  No deformities, BUE and BLE  strength normal and equal Skin: warm and dry  Neuro:  CNs 2-12  intact, no focal abnormalities noted Psych:  Normal affect   EKG:  The EKG was personally reviewed and demonstrates: Sinus rhythm with T wave inversions in inferior lateral leads (noted on prior tracings) Telemetry:  Telemetry was personally reviewed and demonstrates:  SR with PVCs  Relevant CV Studies:  Echo: 05/2017  Study Conclusions   - Procedure narrative: Transthoracic echocardiography. Image  quality was suboptimal. The study was technically difficult.  Intravenous contrast (Definity) was administered.  - Left ventricle: The cavity size was mildly dilated. Systolic  function was normal. The estimated ejection fraction was in the  range of 55% to 60%. Wall motion was normal; there were no  regional wall motion abnormalities. There was an increased  relative contribution of atrial contraction to ventricular  filling. Doppler parameters are consistent with abnormal left  ventricular relaxation (grade 1 diastolic dysfunction).  - Aortic valve: Severe diffuse thickening and calcification  involving the noncoronary cusp. Noncoronary cusp immobility was  noted. There was mild regurgitation. Regurgitation pressure  half-time: 452 ms.  - Aorta: Ascending aortic diameter: 40 mm (S).  - Ascending aorta: The ascending aorta was mildly dilated.  - Mitral valve: Severely calcified annulus. There was trivial  regurgitation.  - Left atrium: The atrium was mildly dilated.  - Right ventricle: The cavity size was moderately dilated. Wall  thickness was normal.  - Pulmonic valve: There was mild regurgitation.  - Pulmonary arteries: PA peak pressure: 36 mm Hg (S).   Laboratory Data:  High Sensitivity Troponin:   Recent Labs  Lab 10/01/19 1251 10/02/19 0703  TROPONINIHS 162* 14,481*     Chemistry Recent Labs  Lab 10/01/19 0937 10/02/19 0703  NA 134* 132*  K 4.3 4.7  CL 89* 92*  CO2 30 22  GLUCOSE 84 96  BUN 48* 67*  CREATININE 5.02* 6.18*  CALCIUM  8.6* 7.7*  GFRNONAA 10* 7*  GFRAA 11* 9*  ANIONGAP 15 18*    Recent Labs  Lab 10/01/19 0937 10/02/19 0703  PROT 7.6  --   ALBUMIN 2.2* 1.9*  AST 45*  --   ALT 14  --   ALKPHOS 48  --   BILITOT 0.5  --    Hematology Recent Labs  Lab 10/01/19 0937 10/02/19 0703  WBC 8.3 7.9  RBC 2.53* 2.83*  HGB 6.3* 7.4*  HCT 21.0* 22.9*  MCV 83.0 80.9  MCH 24.9* 26.1  MCHC 30.0 32.3  RDW 19.1* 17.1*  PLT 353 302   BNPNo results for input(s): BNP, PROBNP in the last 168 hours.  DDimer No results for input(s): DDIMER in the last 168 hours.   Radiology/Studies:  CT HEAD WO CONTRAST  Result Date: 10/01/2019 CLINICAL DATA:  Altered mental status EXAM: CT HEAD WITHOUT CONTRAST TECHNIQUE: Contiguous axial images were obtained from the base of the skull through the vertex without intravenous contrast. COMPARISON:  None. FINDINGS: Brain: There is atrophy and chronic small vessel disease changes. No acute intracranial abnormality. Specifically, no hemorrhage, hydrocephalus, mass lesion, acute infarction, or significant intracranial injury. Vascular: No hyperdense vessel or unexpected calcification. Skull: No acute calvarial abnormality. Sinuses/Orbits: Near complete opacification of the left paranasal sinuses. Mucosal thickening throughout the right paranasal sinuses. Mastoid air cells clear. Other: None IMPRESSION: Atrophy, chronic microvascular disease. No acute intracranial abnormality. Chronic sinusitis. Electronically Signed   By: Rolm Baptise M.D.   On: 10/01/2019 10:15   DG Chest Portable 1 View  Result Date: 10/01/2019 CLINICAL DATA:  Weakness. Additional provided: Patient reports weakness and lethargy since returning from dialysis yesterday. EXAM: PORTABLE CHEST 1 VIEW COMPARISON:  Prior chest radiograph 07/06/2017 and earlier. FINDINGS: Prior median sternotomy and CABG. Stable, mild cardiomegaly. Aortic atherosclerosis. No appreciable airspace consolidation or pulmonary edema. No evidence of  pleural effusion or pneumothorax. No acute bony abnormality identified. IMPRESSION: No evidence of acute cardiopulmonary abnormality. Stable mild cardiomegaly status post CABG. Aortic Atherosclerosis (ICD10-I70.0). Electronically Signed   By: Kellie Simmering DO   On: 10/01/2019 09:37       TIMI Risk Score for Unstable Angina or Non-ST Elevation MI:   The patient's TIMI risk score is 5, which indicates a 26% risk of all cause mortality, new or recurrent myocardial infarction or need for urgent revascularization in the next 14 days.    Assessment and Plan:   [name] [name] is a 86 y.o. male with a hx of CAD status post CABG (LIMA to LAD, SVG to intermediate, SVG to OM, SVG to posterior descending artery), ESRD on HD, hypertension, prostate CA, diabetes, anemia of chronic disease who is being seen today for the evaluation of elevated troponin at the request of Dr. Nori Riis.   1. NSTEMI: patient presented with confusion and weakness for one week prior, but poor PO intake for at least a month. Noted to be anemic with Hgb of 6.3. He denies any chest pain prior to admission, or shortness of breath. Unable to assess for anginal symptoms as he does not do much activity and needs the assistance of his wife. EKG does not show any acute ischemia. hsTn significantly elevated, therefore likely had some coronary event. Given his frail state, age and other co-morbidities would recommend medical management. Unable to heparinize given his anemia. Consider adding low dose BB if blood pressures can tolerate and continue statin. Defer ASA until GI work up is complete.  -- will check echo  2. Anemia 2/2 GI bleed: hgb 6.3 on admission. S/p 2 units PRBC. Hgb 7.4 today. GI following with plans for upper endoscopy tomorrow.   3. ESRD on HD: underwent HD today without complications. Nephrology following.   4. Prostate CA: followed by Dr. Alen Blew as an outpatient.   For questions or updates, please contact [place] Please  consult [email or web address] for contact info under    Signed, Reino Bellis, NP  10/02/2019 1:12 PM   History and all data above reviewed.  Patient examined.  I agree with the findings as above.  Looks like he presented for essentially failure to thrive with decreased p.o. intake and confusion.  He has acute on chronic anemia.  He has no family in the room with him and he is not able to give me any history.  There is a mention in his admission note that he described chest pain.  However, he does not report any of that to me.  He told me he was not married but I see his spouse's name and I tried to call her and was unable to contact her.  He is minimally responsive to questions and very confused.  He does have elevated cardiac enzymes as recorded above.  He has EKG changes.  However, these are not new.   the patient exam reveals COR:RRR  ,  Lungs: decreased breath sounds  ,  Abd: Positive bowel sounds, no rebound no guarding, Ext No edema decreased dorsalis pedis and posterior tibialis bilaterally.  All available labs, radiology testing, previous records reviewed. Agree with documented  assessment and plan.  NQWMI: Does have an elevated high-sensitivity troponin consistent with a non-Q wave myocardial infarction.  However, this is in the face of advanced age, severe comorbid illnesses and failure to thrive.  This is probably mostly driven by severe anemia.  He also looks to have malnutrition.  He has end-stage renal disease and what is reported as stage IV prostate cancer.  With all of this he is clearly not a candidate for any invasive evaluation.  In fact at present he is not on aspirin because of his bleeding.  He should be started on this is cleared by GI.  Might not suggest Plavix in this situation because of his anemia and possible bleeding.  I think would be most appropriate determine goals of therapy and certainly invasive evaluation is not appropriate.  It is reasonable to check an echocardiogram.  The  patient is a DNR and goals of therapy discussions are appropriate.    [name] Domenico Achord  2:26 PM  10/02/2019

## 2019-10-02 NOTE — Progress Notes (Signed)
Pharmacy Antibiotic Note  [name] [name] is a 86 y.o. male admitted on 10/01/2019 with worsening symptomatic anemia and confusion. Patient is ESRD with HD scheduled for Tuesday/Thursday/Saturday with last session 9/23.   He recently developed new fever with TMax of 102.8 10/02/19 0244, slightly increased procalcitonin of 0.79, and troponin of 14,481. Due to patient's clinical status, empiric antibiotics will be started. Pharmacy has been consulted for vancomycin and zosyn dosing.  Plan: - Initiate zosyn 2.25g IV q8h. - Initiate vancomycin 1500 mg IV loading dose x 1. - Initiate vancomycin 750 mg IV after every HD session starting 9/25.  Goal pre-HD level 15-25 mcg/mL. - Pre-HD levels will be drawn as needed - Monitor clinical status and HD schedule - Deescalate antibiotics and length of therapy as appropriate   Weight: 69.1 kg (152 lb 5.4 oz)  Temp (24hrs), Avg:99 F (37.2 C), Min:97.9 F (36.6 C), Max:102.8 F (39.3 C)  Recent Labs  Lab 10/01/19 0937 10/01/19 1011 10/02/19 0703  WBC 8.3  --  7.9  CREATININE 5.02*  --  6.18*  LATICACIDVEN  --  1.2  --     Estimated Creatinine Clearance: 8.2 mL/min (A) (by C-G formula based on SCr of 6.18 mg/dL (H)).  Patient is on HD every Tuesday/Thursday/Saturday.  No Known Allergies  Antimicrobials this admission: 9/23 vancomycin >> 9/23 zosyn >>   Microbiology results: 9/22 BCx: no growth to date 9/22 SARS-CoV-2: negative 9/22 GI Panel: pending 9/22 C. Difficile Quick Screen: pending  Thank you for allowing pharmacy to be a part of this patient's care.  Alden Benjamin 10/02/2019 1:10 PM

## 2019-10-02 NOTE — Plan of Care (Signed)

## 2019-10-02 NOTE — Progress Notes (Signed)
Family Medicine Teaching Service Daily Progress Note Intern Pager: [phone]  Patient name: [name] [name] Medical record number: [id number] Date of birth: [birth year] Age: 86 y.o. Gender: male  Primary Care Provider: [name], MD Consultants: GI, Nephrology Code Status: DNR confirmed with wife  Pt Overview and Major Events to Date:  Admitted on 9/22  Assessment and Plan:  Coalton Arch is a 86 y.o. male presenting with weakness and lethargy. PMH is significant for ESRD, secondary hyperparathyroidism, HLD, gout, HTN, CAD s/p CABG (2014), anemia, occult blood in stools, prostatic cancer.         Acute change in Mental status   Confusion   Weakness Patient presented to the ED with 1 week history of change in mental status, weakness, confusion per wife with 1 month history of decreased PO intake and weight loss (6lbs in 1 month). On arrival to the ED patient was A&O x4, however on admission patient was A&O x1 (oriented to self, intermittently to place).  Per wife, at baseline patient is able to complete ADLs and some IADL's. Unclear etiology of confusion as patient does not have overt signs of infection/sepsis or stroke.  EtOH level wnl. BUN 48. DDx: Symptomatic anemia (although patient has long history of acute symptomatic anemia in past without change associated change in mental status), CNS infection (less likely due to normal WBC, no nuchal rigidity), acute neurologic event (less likely as CT head was negative for acute process, patient has no focal neurologic deficits), consider patient induced/drug intoxication (less likely, EtOH level normal, patient has no history of drug use), bacteremia (less likely due to normal WBC, and no systemic symptoms, with no true documented fever since arrival to ED, and normal lactic acid), cardiac etiology (although no murmurs appreciated). TSH WNL. Ammonia WNL. Procal is elevated at 0.74. -Consider MRI in a.m. if no improvement in confusion, or acute  worsening -Follow-up UDS, GI panel - IV fluids - neuro check q4 hours  - 1 additional unit  pRBC - trend trops, repeat EKG - PT/OT recs - NPO until swallow study   Symptomatic anemia, in the setting of chronic anemia  Patient presented to the ED with weakness and lethargy since his hemodialysis session yesterday (9/21). Patient with history of chronically low Hgb in setting of anemia of CKD and malignancy, possible MDS(?). Patient receives regular transfusions in the outpatient setting with oncology.  Last documented transfusion was in May 2021. Patient has had several positive FOBT since 2019.  Patient incontinent of dark stool in the room. Patient known to Dr. Clarene Essex at [facility]. Per GI, patient's last colonoscopy was ~2016 with adenoma; upper endoscopy ~2019 showed 2 duodenal ulcers.  Patient takes low dose aspirin daily but is also on pantoprazole.  Transfusion threshold is 8 given CAD.  DDx: Suspect multifactorial given chronic episodes of severe anemia however concern for acute upper GI bleed (short time duration for decrease of hemoglobin by 2, history of ulcers, dark bloody stools, FOBT positive, history of duodenal ulcers, on low-dose aspirin). Gi consulted in ED, plan for endoscopy on 9/23 now on hold. Was given 2 units last night, post transfusion Hgb was 7.4. Will order an additional unit pRBC -GI consulted, appreciate recs.  EGD was planned but is currently on hold. -Transfusing 1 additional unit pRBC -Follow-up posttransfusion H&H -IV Protonix 40 every 12 hours - NPO at midnight   Fever of unknown source Per ED note, EMS reported a patient temperature of 101.4.  In the room patient had  a temperature of 100.2.  There is no obvious source for infection at the moment.  Physical exam unremarkable with no nuchal rigidity.  Lactic acid normal, WBC 8.3.  CXR negative for acute process. Hemodynamically stable on room air.  DDx: Consider gastrointestinal cause (patient has diarrhea),  urinary cause (patient is reportedly oliguric per wife who reports he has not complained of symptoms), cardiac cause (however no new murmurs) Overnight temp of 102.8 -Started on Zosyn -Started on Vancomycin -Follow-up blood cultures -Follow-up GI panel and C. difficile -Follow-up UA with culture -Continue to monitor temperature -Consider echo if continues to fever   Chest pain   Elevated Trop Patient complained of chest pain in the ED, however denied during admission interview . Labs s/f Troponin 162, PT 16.9, INR 1.4, APTT 48. No baseline trop to compare to given ESRD. CXR: No evidence of acute cardiopulmonary abnormality; stable mild cardiomegaly status post CABG; aortic atherosclerosis. DDx: Consider ACS (troponin elevation to 162, CXR negative, EKG with some concern for ST depression), possibly was truly abdominal/epigastric pain in the setting of possible GI bleed. Elevated trop may be due to demand ischemia in setting of symptomatic anemia. [place] [address], STAT EKG ordered, Cardiology consulted -Appreciate Cardiology recommendations -Follow-up repeat troponin, EKG -Follow-up blood culture -Continue to monitor   ESRD Patient has a history of ESRD and is treated with HD (T/Th/Sa).  Patient received last HD session on 9/21 as scheduled.  Patient creatinine on admission is 5.02 (baseline is around 3.8-4.3) with a BUN 48.  Sodium is 134, K 4.3, GFR 10  -Nephrology consulted for hemodialysis -Monitor with daily CMP   Prostatic cancer Patient is followed by oncology (Dr. 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  Patient regularly gets hemoglobin checked with oncology, and has had several transfusions, the last one being on 05/21/2019.  Home medications include currently taking Xtandi 160 mg daily (4 doses of 40 mg), and leuprolide 30 mg injection every 4 months (last dose  given on 9/21 per wife). -Continue home [place]    Hypertension Patient blood pressures on admission ranged between 96-126/39-69. Current home meds include amlodipine 5 mg.  Due to some low blood pressures and low diastolic pressures, home amlodipine will be held. -Hold home amlodipine -Continue to monitor blood pressures   FEN/GI: NPO pending swallow study and EGD  PPx: SCD's  Disposition: Progressive  Prior to Admission Living Arrangement: Home Anticipated Discharge Location: Home Barriers to Discharge: Anemia Anticipated discharge in approximately 1-4 day(s).   Subjective:  Interviewed patient bedside in Dialysis Suite. Only mildly responsive to questioning.   He reported no chest pain and no SOB. He reported no issues.  Objective: Temp:  [97.9 F (36.6 C)-102.8 F (39.3 C)] 99.7 F (37.6 C) (09/23 1053) Pulse Rate:  [33-148] 86 (09/23 1053) Resp:  [12-29] 18 (09/23 1053) BP: (90-141)/(39-88) 128/55 (09/23 1053) SpO2:  [70 %-100 %] 100 % (09/23 1053) Weight:  [69.1 kg-71.6 kg] 69.1 kg (09/23 1053) Physical Exam:  Physical Exam Vitals and nursing note reviewed.  Constitutional:      General: He is not in acute distress.    Appearance: He is not ill-appearing, toxic-appearing or diaphoretic.  HENT:     Head: Normocephalic and atraumatic.  Cardiovascular:     Rate and Rhythm: Normal rate and regular rhythm.     Pulses: Normal pulses.  Radial pulses are 2+ on the right side and 2+ on the left side.       Dorsalis pedis pulses are 2+ on the right side and 2+ on the left side.     Heart sounds: Normal heart sounds, S1 normal and S2 normal.  Pulmonary:     Effort: Pulmonary effort is normal. No respiratory distress.     Breath sounds: Normal breath sounds. No wheezing.  Abdominal:     General: There is no distension.     Tenderness: There is no abdominal tenderness. There is no guarding.  Musculoskeletal:     Right lower leg: No edema.     Left lower leg:  No edema.  Neurological:     Comments: A&O x 1 (name only)      Laboratory: Recent Labs  Lab 10/01/19 0937 10/02/19 0703  WBC 8.3 7.9  HGB 6.3* 7.4*  HCT 21.0* 22.9*  PLT 353 302   Recent Labs  Lab 10/01/19 0937 10/02/19 0703  NA 134* 132*  K 4.3 4.7  CL 89* 92*  CO2 30 22  BUN 48* 67*  CREATININE 5.02* 6.18*  CALCIUM 8.6* 7.7*  PROT 7.6  --   BILITOT 0.5  --   ALKPHOS 48  --   ALT 14  --   AST 45*  --   GLUCOSE 84 96    Procal: 0.74 Troponin: 14000 (high) Troponin: 162 (High) FOBT: Positive Lactic Acid: 1.2 (WNL)  Imaging/Diagnostic Tests:   CT Head WO Contrast: Atrophy, chronic microvascular disease. No acute intracranial abnormality. Chronic sinusitis.   DG Chest Portable 1 View: No evidence of acute cardiopulmonary abnormality. Stable mild cardiomegaly status post CABG.   EKG:  sinus rhythm, and abnormal/inverted T waves in inferior lateral leads, unchanged from prior EKG in 2020  Briant Cedar, MD 10/02/2019, 11:50 AM PGY-1, [place] Intern pager: [phone], text pages welcome

## 2019-10-02 NOTE — Progress Notes (Signed)
Pt unable to have labs drawn due to blood tranfusions for 8 plus hours. Transfusion delayed due to canceled orders in the ED. Dialysis made aware to draw labs.  Pt scheduled for dialysis and EGD at the same time. Both departments aware. Dialysis felt like he needed to be dialyzed first and EGD after. EGD stated that they will try to get him directly after dialysis.

## 2019-10-02 NOTE — Procedures (Signed)
   I was present at this dialysis session, have reviewed the session itself and made  appropriate changes Kelly Splinter MD [place] pager [phone]   10/02/2019, 12:52 PM

## 2019-10-03 ENCOUNTER — Encounter (HOSPITAL_COMMUNITY)
Admission: EM | Disposition: A | Discharge status: Hospice | Payer: Self-pay | Source: Home / Self Care | Attending: Family Medicine

## 2019-10-03 ENCOUNTER — Inpatient Hospital Stay (HOSPITAL_COMMUNITY): Payer: Medicare Other | Admitting: Certified Registered Nurse Anesthetist

## 2019-10-03 ENCOUNTER — Inpatient Hospital Stay (HOSPITAL_COMMUNITY): Payer: Medicare Other

## 2019-10-03 ENCOUNTER — Encounter (HOSPITAL_COMMUNITY): Payer: Self-pay | Admitting: Family Medicine

## 2019-10-03 DIAGNOSIS — I340 Nonrheumatic mitral (valve) insufficiency: Secondary | ICD-10-CM

## 2019-10-03 DIAGNOSIS — R410 Disorientation, unspecified: Secondary | ICD-10-CM

## 2019-10-03 DIAGNOSIS — N186 End stage renal disease: Secondary | ICD-10-CM

## 2019-10-03 DIAGNOSIS — I351 Nonrheumatic aortic (valve) insufficiency: Secondary | ICD-10-CM

## 2019-10-03 DIAGNOSIS — I25810 Atherosclerosis of coronary artery bypass graft(s) without angina pectoris: Secondary | ICD-10-CM

## 2019-10-03 DIAGNOSIS — Z992 Dependence on renal dialysis: Secondary | ICD-10-CM

## 2019-10-03 DIAGNOSIS — I350 Nonrheumatic aortic (valve) stenosis: Secondary | ICD-10-CM

## 2019-10-03 DIAGNOSIS — S065X9A Traumatic subdural hemorrhage with loss of consciousness of unspecified duration, initial encounter: Secondary | ICD-10-CM

## 2019-10-03 HISTORY — PX: ESOPHAGOGASTRODUODENOSCOPY (EGD) WITH PROPOFOL: SHX5813

## 2019-10-03 LAB — CBC
HCT: 25 % — ABNORMAL LOW (ref 39.0–52.0)
HCT: 25.9 % — ABNORMAL LOW (ref 39.0–52.0)
Hemoglobin: 8.2 g/dL — ABNORMAL LOW (ref 13.0–17.0)
Hemoglobin: 8.5 g/dL — ABNORMAL LOW (ref 13.0–17.0)
MCH: 27.4 pg (ref 26.0–34.0)
MCH: 27.7 pg (ref 26.0–34.0)
MCHC: 32.8 g/dL (ref 30.0–36.0)
MCHC: 32.8 g/dL (ref 30.0–36.0)
MCV: 83.6 fL (ref 80.0–100.0)
MCV: 84.4 fL (ref 80.0–100.0)
Platelets: 289 10*3/uL (ref 150–400)
Platelets: 292 10*3/uL (ref 150–400)
RBC: 2.99 MIL/uL — ABNORMAL LOW (ref 4.22–5.81)
RBC: 3.07 MIL/uL — ABNORMAL LOW (ref 4.22–5.81)
RDW: 17.3 % — ABNORMAL HIGH (ref 11.5–15.5)
RDW: 18 % — ABNORMAL HIGH (ref 11.5–15.5)
WBC: 9 10*3/uL (ref 4.0–10.5)
WBC: 8.8 10*3/uL (ref 4.0–10.5)
nRBC: 0 % (ref 0.0–0.2)
nRBC: 0 % (ref 0.0–0.2)

## 2019-10-03 LAB — BPAM RBC
Blood Product Expiration Date: 202109221951
Blood Product Expiration Date: 202110102359
Blood Product Expiration Date: 202110252359
Blood Product Expiration Date: 202110262359
ISSUE DATE / TIME: 202109221551
ISSUE DATE / TIME: 202109222005
ISSUE DATE / TIME: 202109230032
ISSUE DATE / TIME: 202109231724
Unit Type and Rh: 5100
Unit Type and Rh: 5100
Unit Type and Rh: 5100
Unit Type and Rh: 5100

## 2019-10-03 LAB — ECHOCARDIOGRAM COMPLETE
AR max vel: 2.05 cm2
AV Area VTI: 2 cm2
AV Area mean vel: 2.06 cm2
AV Mean grad: 10.5 mmHg
AV Peak grad: 19.8 mmHg
Ao pk vel: 2.23 m/s
Area-P 1/2: 3.85 cm2
P 1/2 time: 208 msec
S' Lateral: 4.4 cm
Single Plane A4C EF: 29.8 %
Weight: 2479.73 oz

## 2019-10-03 LAB — PROTIME-INR
INR: 1.9 — ABNORMAL HIGH (ref 0.8–1.2)
Prothrombin Time: 21.3 seconds — ABNORMAL HIGH (ref 11.4–15.2)

## 2019-10-03 LAB — RENAL FUNCTION PANEL
Albumin: 1.8 g/dL — ABNORMAL LOW (ref 3.5–5.0)
Anion gap: 16 — ABNORMAL HIGH (ref 5–15)
BUN: 36 mg/dL — ABNORMAL HIGH (ref 8–23)
CO2: 26 mmol/L (ref 22–32)
Calcium: 7.7 mg/dL — ABNORMAL LOW (ref 8.9–10.3)
Chloride: 95 mmol/L — ABNORMAL LOW (ref 98–111)
Creatinine, Ser: 4.1 mg/dL — ABNORMAL HIGH (ref 0.61–1.24)
GFR calc Af Amer: 14 mL/min — ABNORMAL LOW (ref >60)
GFR calc non Af Amer: 12 mL/min — ABNORMAL LOW (ref >60)
Glucose, Bld: 115 mg/dL — ABNORMAL HIGH (ref 70–99)
Phosphorus: 3.2 mg/dL (ref 2.5–4.6)
Potassium: 3.8 mmol/L (ref 3.5–5.1)
Sodium: 137 mmol/L (ref 135–145)

## 2019-10-03 LAB — TYPE AND SCREEN
ABO/RH(D): O POS
Antibody Screen: NEGATIVE
Unit division: 0
Unit division: 0
Unit division: 0
Unit division: 0

## 2019-10-03 LAB — APTT: aPTT: 47 seconds — ABNORMAL HIGH (ref 24–36)

## 2019-10-03 LAB — GLUCOSE, CAPILLARY
Glucose-Capillary: 112 mg/dL — ABNORMAL HIGH (ref 70–99)
Glucose-Capillary: 92 mg/dL (ref 70–99)

## 2019-10-03 LAB — MAGNESIUM: Magnesium: 1.9 mg/dL (ref 1.7–2.4)

## 2019-10-03 SURGERY — ESOPHAGOGASTRODUODENOSCOPY (EGD) WITH PROPOFOL
Anesthesia: Monitor Anesthesia Care

## 2019-10-03 MED ORDER — LEVETIRACETAM IN NACL 500 MG/100ML IV SOLN
500.0000 mg | INTRAVENOUS | Status: DC
  Administered 2019-10-04: 500 mg via INTRAVENOUS
  Filled 2019-10-03 (×2): qty 100

## 2019-10-03 MED ORDER — PHENYLEPHRINE 40 MCG/ML (10ML) SYRINGE FOR IV PUSH (FOR BLOOD PRESSURE SUPPORT)
PREFILLED_SYRINGE | INTRAVENOUS | Status: DC | PRN
  Administered 2019-10-03 (×3): 40 ug via INTRAVENOUS
  Administered 2019-10-03 (×3): 80 ug via INTRAVENOUS
  Administered 2019-10-03: 40 ug via INTRAVENOUS

## 2019-10-03 MED ORDER — NEPRO/CARBSTEADY PO LIQD
237.0000 mL | Freq: Two times a day (BID) | ORAL | Status: DC
  Administered 2019-10-04: 237 mL via ORAL

## 2019-10-03 MED ORDER — PROPOFOL 10 MG/ML IV BOLUS
INTRAVENOUS | Status: DC | PRN
  Administered 2019-10-03 (×2): 10 mg via INTRAVENOUS

## 2019-10-03 MED ORDER — CHLORHEXIDINE GLUCONATE CLOTH 2 % EX PADS
6.0000 | MEDICATED_PAD | Freq: Every day | CUTANEOUS | Status: DC
  Administered 2019-10-04 – 2019-10-05 (×2): 6 via TOPICAL

## 2019-10-03 MED ORDER — DEXTROSE-NACL 5-0.9 % IV SOLN: INTRAVENOUS | Status: DC

## 2019-10-03 MED ORDER — SODIUM CHLORIDE 0.9 % IV SOLN: INTRAVENOUS | Status: DC | PRN

## 2019-10-03 MED ORDER — PANTOPRAZOLE SODIUM 40 MG IV SOLR
40.0000 mg | INTRAVENOUS | Status: DC
  Administered 2019-10-04 – 2019-10-05 (×2): 40 mg via INTRAVENOUS
  Filled 2019-10-03 (×2): qty 40

## 2019-10-03 MED ORDER — SODIUM CHLORIDE 0.9 % IV SOLN: INTRAVENOUS | Status: DC

## 2019-10-03 MED ORDER — PROPOFOL 500 MG/50ML IV EMUL
INTRAVENOUS | Status: DC | PRN
  Administered 2019-10-03: 70 ug/kg/min via INTRAVENOUS

## 2019-10-03 MED ORDER — LIDOCAINE 2% (20 MG/ML) 5 ML SYRINGE
INTRAMUSCULAR | Status: DC | PRN
  Administered 2019-10-03: 40 mg via INTRAVENOUS

## 2019-10-03 MED ORDER — DOXERCALCIFEROL 4 MCG/2ML IV SOLN
7.0000 ug | INTRAVENOUS | Status: DC
  Filled 2019-10-03: qty 4

## 2019-10-03 MED ORDER — LEVETIRACETAM IN NACL 500 MG/100ML IV SOLN: 500.0000 mg | Freq: Two times a day (BID) | INTRAVENOUS | Status: DC

## 2019-10-03 MED ORDER — LEVETIRACETAM IN NACL 1000 MG/100ML IV SOLN
1000.0000 mg | Freq: Once | INTRAVENOUS | Status: AC
  Administered 2019-10-03: 1000 mg via INTRAVENOUS
  Filled 2019-10-03: qty 100

## 2019-10-03 MED ORDER — RENA-VITE PO TABS
1.0000 | ORAL_TABLET | Freq: Every day | ORAL | Status: DC
  Administered 2019-10-04: 1 via ORAL
  Filled 2019-10-03: qty 1

## 2019-10-03 MED ORDER — LEVETIRACETAM IN NACL 500 MG/100ML IV SOLN: 500.0000 mg | INTRAVENOUS | Status: DC

## 2019-10-03 SURGICAL SUPPLY — 15 items

## 2019-10-03 NOTE — Progress Notes (Signed)
EEG complete - results pending 

## 2019-10-03 NOTE — Progress Notes (Addendum)
Interim Progress Note  Updated by residents that received urgent call from radiology that MRI shows new subdural hematoma, subarachnoid hemorrhage, and small intraventricular hemorrhage.  Concern for acute bleed due to negative CT head on 10/01/19 upon admission. Consulting neurosurgery urgently.  Yehuda Savannah MD FMTS    Update:  At bedside with patient, his wife is present, as well as neurology. Results of MRI were discussed, as well as treatment plan going forward including antiepileptics, EEG, and optimal blood sugar control. Answered all of wife's questions and concerns. Neurosurgery has been paged, awaiting call back.  Yehuda Savannah, FMTS

## 2019-10-03 NOTE — Anesthesia Preprocedure Evaluation (Addendum)
Anesthesia Evaluation  Patient identified by MRN, date of birth, ID band Patient confused    Reviewed: Patient's Chart, lab work & pertinent test results  Airway Mallampati: II  TM Distance: >3 FB Neck ROM: Full    Dental  (+) Edentulous Upper, Edentulous Lower   Pulmonary shortness of breath and with exertion, former smoker,    Pulmonary exam normal        Cardiovascular hypertension, Pt. on medications + CAD and + CABG   Rhythm:Regular Rate:Normal  S/p CABG 2014   Neuro/Psych    GI/Hepatic Neg liver ROS, GIB with anemia   Endo/Other  diabetes, Type 2, Insulin Dependent  Renal/GU ESRF and DialysisRenal disease   Prostate Ca with bone mets    Musculoskeletal  (+) Arthritis , gout   Abdominal Normal abdominal exam  (+)  Abdomen: soft. Bowel sounds: normal.  Peds  Hematology  (+) anemia ,   Anesthesia Other Findings   Reproductive/Obstetrics                            Anesthesia Physical Anesthesia Plan  ASA: III  Anesthesia Plan: MAC   Post-op Pain Management:    Induction:   PONV Risk Score and Plan: 1 and Ondansetron and Dexamethasone  Airway Management Planned: Simple Face Mask and Nasal Cannula  Additional Equipment: None  Intra-op Plan:   Post-operative Plan:   Informed Consent: I have reviewed the patients History and Physical, chart, labs and discussed the procedure including the risks, benefits and alternatives for the proposed anesthesia with the patient or authorized representative who has indicated his/her understanding and acceptance.   Patient has DNR.  Suspend DNR.     Plan Discussed with: CRNA and Surgeon  Anesthesia Plan Comments: (Confirmed suspension of DNR with wife Earlie Server for procedure completion  Lab Results      Component                Value               Date                      WBC                      9.0                 10/03/2019                 HGB                      8.2 (L)             10/03/2019                HCT                      25.0 (L)            10/03/2019                MCV                      83.6                10/03/2019                PLT  289                 10/03/2019           Lab Results      Component                Value               Date                      NA                       137                 10/03/2019                K                        3.8                 10/03/2019                CO2                      26                  10/03/2019                GLUCOSE                  115 (H)             10/03/2019                BUN                      36 (H)              10/03/2019                CREATININE               4.10 (H)            10/03/2019                CALCIUM                  7.7 (L)             10/03/2019                GFRNONAA                 12 (L)              10/03/2019                GFRAA                    14 (L)              10/03/2019           Covid-19 Nucleic Acid Test Results Lab Results      Component                Value               Date  [place]              NEGATIVE            10/01/2019          )      Anesthesia Quick Evaluation

## 2019-10-03 NOTE — Progress Notes (Signed)
SLP Cancellation Note  Patient Details Name: [name] [name] MRN: [id number] DOB: 25-Aug-1930   Cancelled treatment:       Reason Eval/Treat Not Completed: Patient at procedure or test/unavailable. Attempted to see pt this afternoon but he was off the floor for more testing (MRI). Will f/u as able.   Osie Bond., M.A. [facility] Pager [phone] Office [phone]  10/03/2019, 3:24 PM

## 2019-10-03 NOTE — Anesthesia Procedure Notes (Signed)
Procedure Name: MAC Date/Time: 10/03/2019 8:18 AM Performed by: Janene Harvey, CRNA Pre-anesthesia Checklist: Patient identified, Emergency Drugs available, Suction available and Patient being monitored Patient Re-evaluated:Patient Re-evaluated prior to induction Oxygen Delivery Method: Nasal cannula Placement Confirmation: positive ETCO2 Dental Injury: Teeth and Oropharynx as per pre-operative assessment

## 2019-10-03 NOTE — Progress Notes (Signed)
Spoke with Dr. Marcello Moores, neurosurgery. He recommends keppra, eeg, and a repeat CT head in the AM to ensure SAH, IPH not worse. He also recommends checking coag panel. Neurology has also left recommendations, see Dr. Darral Dash note.  Gladys Damme, MD [facility], PGY-2

## 2019-10-03 NOTE — Progress Notes (Signed)
Spoke with patients wife to update her. Discussed current findings: hemoglobin post transfusions, GI findings and recommendations. Discussed plans to obtain MRI. Stated that we would reach back out to her once this was completed.

## 2019-10-03 NOTE — Consult Note (Signed)
CC: left subdural hematoma  HPI:     Patient is a 86 y.o. male presents with ESRD, DM, prostate CA was brought to the hospital for AMS for 1 week.  Initial CT was negative for ICH but subsequent MRI showed a small left frontal SDH.  He is not on any blood thinners.    Patient Active Problem List   Diagnosis Date Noted  . Altered mental status   . Coronary artery disease involving native coronary artery of native heart without angina pectoris 09/24/2017  . Prostate CA ([place]) 09/07/2017  . Anemia 05/29/2015  . Occult blood in stools 05/29/2015  . Anemia of chronic kidney failure 08/15/2014  . Shortness of breath   . Symptomatic anemia 06/11/2014  . 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 End-stage renal disease (ESRD) ([place]) 12/30/2012  . Secondary hyperparathyroidism ([place]) 12/30/2012  . Hypotension 06/16/2011  . Palpitations 06/16/2011  . CAD (coronary artery disease) of artery bypass graft 04/26/2009  . DYSPNEA ON EXERTION 03/04/2009  . HLD (hyperlipidemia) 03/03/2009  . GOUT 03/03/2009  . Obesity 03/03/2009  . Essential hypertension 03/03/2009   Past Medical History:  Diagnosis Date  . Anemia of chronic disease   . Coronary artery disease   . Dialysis patient ([place])    T, TH, Sat  . Dyslipidemia   . ESRD (end stage renal disease) on dialysis [facility])    "[employer]rial; T, Thurs, Sat" (12/26/2012)  . Gout   . History of blood transfusion   . Hypertension   . Metastatic cancer to bone ([place]) dx'd 04/04/17  . MYOCARDIAL INFARCTION, ACUTE, NON-Q WAVE 04/26/2009   Qualifier: Diagnosis of  By: Percival Spanish, MD, Farrel Gordon    . Obesity   . Prostate CA ([place]) dx'd 12/2014   initial dx  . Type II diabetes mellitus ([place])    "diet controlled" (12/26/2012)    Past Surgical History:  Procedure Laterality Date  . ARTERIOVENOUS GRAFT PLACEMENT Right 12/2008   "upper arm"  . AV FISTULA PLACEMENT    . BIOPSY  07/09/2017   Procedure: BIOPSY;  Surgeon: Wonda Horner, MD;  Location[facility] ENDOSCOPY;  Service:  Endoscopy;;  . CARDIAC CATHETERIZATION  12/30/2012  . COLONOSCOPY W/ POLYPECTOMY    . CORONARY ARTERY BYPASS GRAFT N/A 12/31/2012   Procedure: CORONARY ARTERY BYPASS GRAFTING times four using left internal mammary and right saphenous vein;  Surgeon: Grace Isaac, MD;  Location: [facility];  Service: Open Heart Surgery;  Laterality: N/A;  . ESOPHAGOGASTRODUODENOSCOPY (EGD) WITH PROPOFOL N/A 07/09/2017   Procedure: ESOPHAGOGASTRODUODENOSCOPY (EGD) WITH PROPOFOL;  Surgeon: Wonda Horner, MD;  Location: [facility] ENDOSCOPY;  Service: Endoscopy;  Laterality: N/A;  . EYE SURGERY Bilateral    cataract  . INTRAOPERATIVE TRANSESOPHAGEAL ECHOCARDIOGRAM N/A 12/31/2012   Procedure: INTRAOPERATIVE TRANSESOPHAGEAL ECHOCARDIOGRAM;  Surgeon: Grace Isaac, MD;  Location: [facility];  Service: Open Heart Surgery;  Laterality: N/A;  . LEFT HEART CATHETERIZATION WITH CORONARY ANGIOGRAM N/A 12/30/2012   Procedure: LEFT HEART CATHETERIZATION WITH CORONARY ANGIOGRAM;  Surgeon: Burnell Blanks, MD;  Location: [facility] CATH LAB;  Service: Cardiovascular;  Laterality: N/A;  . REVISON OF ARTERIOVENOUS FISTULA Right 02/06/2018   Procedure: REVISION PLICATION OF ARTERIOVENOUS FISTULA RIGHT ARM;  Surgeon: Waynetta Sandy, MD;  Location: [facility];  Service: Vascular;  Laterality: Right;  . REVISON OF ARTERIOVENOUS FISTULA Right 04/03/2018   Procedure: REVISION OF ARTERIOVENOUS FISTULA RIGHT ARM;  Surgeon: Rosetta Posner, MD;  Location: [facility];  Service: Vascular;  Laterality: Right;  Medications Prior to Admission  Medication Sig Dispense Refill Last Dose  . amLODipine (NORVASC) 5 MG tablet TAKE 1 TABLET BY MOUTH DAILY (Patient taking differently: Take 5 mg by mouth daily. ) 90 tablet 3 09/30/2019 at Unknown time  . aspirin EC 81 MG tablet Take 1 tablet (81 mg total) by mouth daily. (Patient taking differently: Take 81 mg by mouth every evening. ) 90 tablet 3 09/30/2019 at Unknown time  . atorvastatin (LIPITOR) 20 MG tablet Take  1 tablet (20 mg total) by mouth every evening. 90 tablet 3 09/30/2019 at Unknown time  . B Complex-C-Folic Acid (DIALYVITE 956) 0.8 MG TABS Take 0.8 mg by mouth every evening.   3 09/30/2019 at Unknown time  . cinacalcet (SENSIPAR) 60 MG tablet Take 60 mg by mouth every evening.    09/30/2019 at Unknown time  . latanoprost (XALATAN) 0.005 % ophthalmic solution Place 1 drop into both eyes at bedtime.   09/30/2019 at Unknown time  . leuprolide (LUPRON) 30 MG injection Inject 30 mg into the muscle every 4 (four) months.   09/30/2019 at Unknown time  . pantoprazole (PROTONIX) 40 MG tablet Take 1 tablet (40 mg total) by mouth daily. (Patient taking differently: Take 40 mg by mouth every evening. ) 30 tablet 0 09/30/2019 at Unknown time   No Known Allergies  Social History   Tobacco Use  . Smoking status: Former Smoker    Packs/day: 1.00    Years: 40.00    Pack years: 40.00    Types: Cigarettes    Quit date: 06/16/1995    Years since quitting: 24.3  . Smokeless tobacco: Never Used  Substance Use Topics  . Alcohol use: No    Alcohol/week: 0.0 standard drinks    Family History  Problem Relation Age of Onset  . Stroke Mother   . Diabetes Sister      Review of Systems Review of systems not obtained due to patient factors.  Objective:   Patient Vitals for the past 8 hrs:  BP Temp Pulse Resp  10/03/19 1307 (!) 100/42 98.8 F (37.1 C) 76 16   I/O last 3 completed shifts: In: 1199.3 [P.O.:240; I.V.:206.5; Blood:315; IV Piggyback:437.8] Out: 2000 [Other:2000] No intake/output data recorded.    NAD.  Eyes open.  Says, "oww", but no sentences or phrases.  Following simple commands x 4.  Data Review  MRI brain without contrast reviewed.  Small-moderate left frontal extraaxial collection likely representing a acute subdural hematoma.  No significant mass effect.  Assessment:   Principal Problem:   Symptomatic anemia Active Problems:   CAD (coronary artery disease) of artery bypass  graft   End-stage renal disease (ESRD) ([facility])   Altered mental status   Plan:   - recommend AED prophylaxis, EEG given neurologic symptoms out of proportion to size of SDH - repeat CT head in am.  If stable, he will require another one in 2 weeks.

## 2019-10-03 NOTE — Progress Notes (Signed)
Today's endoscopy was well-tolerated.  Although some erythematous/peptic duodenitis was present, I did not see any evidence of ulcer disease (as was seen 2 years ago) or any other cause for anemia, heme positivity, or recent dark stool.  Today, patient's hemoglobin of 8.2 reflects an appropriate rise following transfusion of 1 unit of packed cells yesterday.  Similarly, yesterday's hemoglobin of 7.4 reflected an appropriate rise from the transfusion of 1 unit of packed cells the day before.  In other words, the patient does not appear to have evidence of significant ongoing blood loss.  Recommendations:  1.  Given the patient's age and medical condition, I would not be inclined to be aggressive in doing a further GI work-up on him.  For example, I do not think I would pursue an updated colonoscopy, or a capsule endoscopy, unless ongoing blood loss with a frequent transfusion requirement in the future were to force our hand.  2.  I would keep this patient on PPI therapy indefinitely.  Okay to transition to pantoprazole 40 mg once a day (in view of renal failure).  3. I have restarted a diet on this patient, since further GI work-up is not anticipated at this time  4.  A more difficult question is whether to resume the patient's aspirin.  I do not see any endoscopic contraindication to doing so.  It may be playing a role in his heme positivity and blood loss--for example, from small bowel or colonic ulcerations or enhancement of blood loss from small bowel vascular ectasia.  Cessation of aspirin is sometimes helpful in controlling the chronic occult or intermittent blood loss in elderly patients.  Therefore, the anticipated benefit of aspirin prophylaxis has to be weighed against the potential it has to cause GI tract blood loss and anemia.  One reasonable step might be to resume the aspirin at this time, with the intent of stopping it if recurrent or progressive anemia, associated with heme positivity and  frequent transfusion requirement, becomes an issue in the future.   I will sign off.  Please call me if you have questions, or if I can be of further assistance in this patient's care.  Cleotis Nipper, M.D. Pager [phone] If no answer or after 5 PM call [phone]

## 2019-10-03 NOTE — Transfer of Care (Signed)
Immediate Anesthesia Transfer of Care Note  Patient: [name] [name]  Procedure(s) Performed: ESOPHAGOGASTRODUODENOSCOPY (EGD) WITH PROPOFOL (N/A )  Patient Location: Endoscopy Unit  Anesthesia Type:MAC  Level of Consciousness: drowsy  Airway & Oxygen Therapy: Patient Spontanous Breathing and Patient connected to nasal cannula oxygen  Post-op Assessment: Report given to RN and Post -op Vital signs reviewed and stable  Post vital signs: Reviewed  Last Vitals:  Vitals Value Taken Time  BP    Temp    Pulse    Resp    SpO2      Last Pain:  Vitals:   10/03/19 0731  TempSrc: Oral  PainSc: 0-No pain      Patients Stated Pain Goal: 0 (96/78/93 8101)  Complications: No complications documented.

## 2019-10-03 NOTE — Progress Notes (Signed)
FPTS Interim Progress Note  S: Team received page by Radiology for critical result. MRI showed- New left frontal subdural hematoma measuring up to 1.2 cm with subarachnoid hemorrhage. Small volume left anterior and bilateral occipital horn intraventricular hemorrhage. Mild cerebral atrophy. Advanced chronic microvascular ischemic changes.   Paged Neurosurgery and Neurology. Neurology said they would come to see the patient. Went to go see the patient and wife was in the room with the patient. During interview Neurology arrived.  Patient was able to move his toes and feet on command. Was able to states his name but unable to answer any other questions. Would respond with "yeah" when asked questions. Able to look at person asking questions. Neurology suspects aphasia maybe pressent due to location of bleed.   O: BP (!) 100/42 (BP Location: Left Arm)   Pulse 76   Temp 98.8 F (37.1 C)   Resp 16   Wt 70.3 kg   SpO2 100%   BMI 21.02 kg/m   Physical Exam Vitals and nursing note reviewed.  Constitutional:      General: He is not in acute distress.    Appearance: He is not ill-appearing, toxic-appearing or diaphoretic.  Neurological:     Comments: Pupils reactive to light, minimally  Grip strength intact Able to wiggle toes, unable to lift lower extremities against gravity but muscles did contract (2/5) Sensation intact upper and lower extremities. Able to grimace when prompted.  A&O x 1 (Name only)      A/P: Subdural Hematoma: Newly discovered on MR head. Neurology and Neurosurgery have been consulted. Neurology has seen the patient, recommend starting Keppra and obtaining EEG. -Keppra 1000 mg loading dose with 500 mg BID and 250 mg after dialysis -EEG ordered -Will re-page Neurosurgery  -Will have night team see patient -Appreciate additional Neurology recommendations   Briant Cedar, MD 10/03/2019, 4:54 PM PGY-1, [facility] Service pager [phone]

## 2019-10-03 NOTE — Progress Notes (Signed)
PT Cancellation Note  Patient Details Name: [name] [name] MRN: [id number] DOB: 12/06/1930   Cancelled Treatment:    Reason Eval/Treat Not Completed: Patient at procedure or test/unavailable - pt out of room at MRI, will check back.  [place] Pager [phone]  Office [phone]     Julien Girt 10/03/2019, 2:33 PM

## 2019-10-03 NOTE — Progress Notes (Signed)
[facility] Progress Note   Dialysis Orders: TTS [place]  4h  69.5kg  Hep none 2/2.25 bath  R AVF - good compliance, getting to edw - sp IV Fe load  - mircera 225 q2 last 9/14, due 9/28  - hect 7 ug tiw  - 9/21- Hb 6.4, has been < 7.6 all september   Assessment/ Plan: 1. Anemia - due to CKD +/- GIB. EGD - erythematous/peptic duodenitis - no ulder or explanation for dark stools - 2 units PRBC since admission - no evidence of ongoing blood loos - GI does not recommend more aggressive work up unless he continues to require frequent transfusions - PPI indefinitely 1 per day - consider stopping ASA/  2. ESRD - on HD TTS. HD Sat - no heparin ongoing -low Na yesterday - corrected with HD  3. BP /vol - BP is stable , chronic low BP - net UF 2 L Thursday  > 69.1 4. AMS - has underlying pretty severe dementia but at baseline he is alert and pleasant and can carry on a superficial conversation 5. H/o CAD/ MI  6. Anemia of ckd - getting max esa w/ OP HH and just finished 1 gm IV fe load.hgb up to 8.2 post transfusion 7. Prostate cancer - w/ bony mets 8. Dementia 9. Fever Tmax 101 this am - 99s yesterday BC pending - s/p empiric abtx - CXR neg 10. Nutrition - added nepro/vits alb low 11. MBD - resume hectorol - currently not on binders as outpatient 12. DNR  Myriam Jacobson, PA-C [place] [phone] 10/03/2019,12:10 PM  LOS: 2 days   Subjective:   Drowsy - weak.  Poorly interactive - not his usual at all. S/p EGD this am.  Objective Vitals:   10/03/19 0905 10/03/19 0915 10/03/19 0927 10/03/19 1000  BP: (!) 93/18 (!) 96/30 (!) 93/34 (!) 102/45  Pulse: 72 74 75 80  Resp: 16 20 (!) 21   Temp:      TempSrc:      SpO2: 100% 100% 100% 100%  Weight:       Physical Exam General:ill appearing male breathing easily - warm to touch Heart: RRR Lungs: grossly clear Abdomen: soft NT Extremities: no LE edema Dialysis Access: right AVF +  bruit   Additional Objective Labs: Basic Metabolic Panel: Recent Labs  Lab 10/01/19 0937 10/02/19 0703 10/03/19 0058  NA 134* 132* 137  K 4.3 4.7 3.8  CL 89* 92* 95*  CO2 30 22 26   GLUCOSE 84 96 115*  BUN 48* 67* 36*  CREATININE 5.02* 6.18* 4.10*  CALCIUM 8.6* 7.7* 7.7*  PHOS  --  4.1 3.2   Liver Function Tests: Recent Labs  Lab 10/01/19 0937 10/02/19 0703 10/03/19 0058  AST 45*  --   --   ALT 14  --   --   ALKPHOS 48  --   --   BILITOT 0.5  --   --   PROT 7.6  --   --   ALBUMIN 2.2* 1.9* 1.8*   No results for input(s): LIPASE, AMYLASE in the last 168 hours. CBC: Recent Labs  Lab 10/01/19 0937 10/02/19 0703 10/03/19 0058  WBC 8.3 7.9 9.0  NEUTROABS 6.4  --   --   HGB 6.3* 7.4* 8.2*  HCT 21.0* 22.9* 25.0*  MCV 83.0 80.9 83.6  PLT 353 302 289   Blood Culture    Component Value Date/Time   SDES BLOOD LEFT HAND 10/01/2019 1252  SPECREQUEST  10/01/2019 [place] Blood Culture results may not be optimal due to an inadequate volume of blood received in culture bottles   CULT  10/01/2019 1252    NO GROWTH 1 DAY Performed at [facility], [place] [address], [place], [place] [address]    REPTSTATUS PENDING 10/01/2019 1252    Cardiac Enzymes: No results for input(s): CKTOTAL, CKMB, CKMBINDEX, TROPONINI in the last 168 hours. CBG: Recent Labs  Lab 10/01/19 1652 10/03/19 0741  GLUCAP 98 112*   Iron Studies: No results for input(s): IRON, TIBC, TRANSFERRIN, FERRITIN in the last 72 hours. Lab Results  Component Value Date   INR 1.4 (H) 10/01/2019   INR 1.45 12/31/2012   INR 1.12 12/30/2012   Studies/Results: No results found. Medications: . piperacillin-tazobactam (ZOSYN)  IV 2.25 g (10/03/19 0500)  . [START ON 10/04/2019] vancomycin     . Chlorhexidine Gluconate Cloth  6 each Topical Q0600  . enzalutamide  160 mg Oral Daily  . [START ON 10/04/2019] pantoprazole (PROTONIX) IV  40 mg Intravenous Q24H

## 2019-10-03 NOTE — Procedures (Signed)
Patient Name: [name] [name]  MRN: [id number]  Epilepsy Attending: Lora Havens  Referring Physician/Provider: Dr Amie Portland Date: 10/03/2019 Duration: 23.46 mins  Patient history: 86 year old man with altered mental status, noted to have hyperacute acute subdural hematoma on the left side along with mild IVH and anterior left and bilateral posterior lateral ventricles. On examination, patient appears encephalopathic and aphasic. EEG to evaluate for seizures.  Level of alertness: Awake/ lethargic  AEDs during EEG study: LEV  Technical aspects: This EEG study was done with scalp electrodes positioned according to the 10-20 International system of electrode placement. Electrical activity was acquired at a sampling rate of 500Hz  and reviewed with a high frequency filter of 70Hz  and a low frequency filter of 1Hz . EEG data were recorded continuously and digitally stored.   Description: NoThe posterior dominant rhythm was seen. EEG showed continuous/ generalized polymorphic 3 to 6 Hz theta-delta slowing.  Hyperventilation and photic stimulation were not performed.     ABNORMALITY -Continuous slow, generalized  IMPRESSION: This study is suggestive of moderate diffuse encephalopathy, nonspecific etiology. No seizures or epileptiform discharges were seen throughout the recording.  [name] [name] Barbra Sarks

## 2019-10-03 NOTE — Op Note (Signed)
[facility] Patient Name: [name] [name] Procedure Date : 10/03/2019 MRN: [id number] Attending MD: Ronald Lobo , MD Date of Birth: 1930-01-12 CSN: [id number] Age: 86 Admit Type: Inpatient Procedure:                Upper GI endoscopy Indications:              Acute post hemorrhagic anemia, Melena. History of                            duodenal ulcers on endoscopy 2 years ago. Providers:                Ronald Lobo, MD, Angus Seller, Elspeth Cho                            Tech., Technician, Reather Laurence, CRNA Referring MD:              Medicines:                Monitored Anesthesia Care Complications:            No immediate complications. Estimated Blood Loss:     Estimated blood loss: none. Procedure:                Pre-Anesthesia Assessment:                           - Prior to the procedure, a History and Physical                            was performed, and patient medications and                            allergies were reviewed. The patient's tolerance of                            previous anesthesia was also reviewed. The risks                            and benefits of the procedure and the sedation                            options and risks were discussed with the patient.                            All questions were answered, and informed consent                            was obtained. Prior Anticoagulants: The patient has                            taken no previous anticoagulant or antiplatelet                            agents except for aspirin. ASA Grade Assessment:  III - A patient with severe systemic disease. After                            reviewing the risks and benefits, the patient was                            deemed in satisfactory condition to undergo the                            procedure.                           After obtaining informed consent, the endoscope was                             passed under direct vision. Throughout the                            procedure, the patient's blood pressure, pulse, and                            oxygen saturations were monitored continuously. The                            GIF-H190 ([id number]) Olympus gastroscope was                            introduced through the mouth, and advanced to the                            second part of duodenum. The upper GI endoscopy was                            accomplished without difficulty. The patient                            tolerated the procedure well. Scope In: Scope Out: Findings:      The larynx was normal.      A 1 cm hiatal hernia was present.      The exam of the esophagus was otherwise normal.      The entire examined stomach was normal. No blood present.      The cardia and gastric fundus were normal on retroflexion.      Patchy moderately erythematous and edematous mucosa without active       bleeding and with no stigmata of bleeding was found in the second       portion of the duodenum. Careful inspection for ulcers was negative. Impression:               - Normal larynx.                           - 1 cm hiatal hernia.                           - Normal stomach.                           -  Erythematous duodenopathy.                           - No specimens collected. Recommendation:           - Continue present medications. Procedure Code(s):        --- Professional ---                           [phone], Esophagogastroduodenoscopy, flexible,                            transoral; diagnostic, including collection of                            specimen(s) by brushing or washing, when performed                            (separate procedure) Diagnosis Code(s):        --- Professional ---                           K31.89, Other diseases of stomach and duodenum CPT copyright 2019 American Medical Association. All rights reserved. The codes documented in this report are preliminary and  upon coder review may  be revised to meet current compliance requirements. Ronald Lobo, MD 10/03/2019 9:08:14 AM This report has been signed electronically. Number of Addenda: 0

## 2019-10-03 NOTE — Anesthesia Postprocedure Evaluation (Signed)
Anesthesia Post Note  Patient: Deryk Bozman  Procedure(s) Performed: ESOPHAGOGASTRODUODENOSCOPY (EGD) WITH PROPOFOL (N/A )     Patient location during evaluation: PACU Anesthesia Type: MAC Level of consciousness: awake and alert Pain management: pain level controlled Vital Signs Assessment: post-procedure vital signs reviewed and stable Respiratory status: spontaneous breathing, nonlabored ventilation, respiratory function stable and patient connected to nasal cannula oxygen Cardiovascular status: stable and blood pressure returned to baseline Postop Assessment: no apparent nausea or vomiting Anesthetic complications: no Comments: Post procedure patient hypotensive in recovery room with wide pulse pressure as seen in pre-op. At baseline mental status. Given IVF with increase in BP and ultimately discharged from recovery in stable state.    No complications documented.  Last Vitals:  Vitals:   10/03/19 0927 10/03/19 1000  BP: (!) 93/34 (!) 102/45  Pulse: 75 80  Resp: (!) 21   Temp:    SpO2: 100% 100%    Last Pain:  Vitals:   10/03/19 0927  TempSrc:   PainSc: 0-No pain                 March Rummage Eliyohu Class

## 2019-10-03 NOTE — Interval H&P Note (Signed)
History and Physical Interval Note:  10/03/2019 8:10 AM  [name] [name]  has presented today for surgery, with the diagnosis of anemia, heme positive stool.  The various methods of treatment have been discussed with the patient and family. After consideration of risks, benefits and other options for treatment, the patient has consented to  Procedure(s): ESOPHAGOGASTRODUODENOSCOPY (EGD) WITH PROPOFOL (N/A) as a surgical intervention.  The patient's history has been reviewed, patient examined, no change in status, stable for surgery.  I have reviewed the patient's chart and labs.  Questions were answered to the patient's satisfaction.     Youlanda Mighty Tolulope Pinkett

## 2019-10-03 NOTE — Consult Note (Signed)
Neurology Consultation  Reason for Consult: Subdural hematoma, intraventricular hemorrhage Referring Physician: Dr. Yehuda Savannah, MD  CC: Altered mental status, subdural hematoma, intraventricular hemorrhage  History is obtained from: Chart, patient's wife, primary team  HPI: [name] [name] is a 86 y.o. male medical history of ESRD on dialysis, hypertension, diabetes, prostate cancer, anemia of chronic disease, presenting to the emergency room for evaluation of altered mental status, admitted by family medicine service and noted to have subdural hematoma on imaging that was done to evaluate the altered mental status According to family he has been feeling unwell for the past over 1 week.  Upon no improvement being seen over the past few days and more so worsening seen over the preceding 2 days, he was brought to the hospital.  Initial head CT was negative for any acute process. Admitted to family medicine service.  Being treated for toxic metabolic encephalopathy versus evaluation for underlying infection. MRI ordered as a part of the work-up revealed a left frontal subdural hematoma with minimal bilateral intraventricular bleeding. This is not present on the CT head at the time of presentation.  LKW: Over 1 week ago tpa given?: no, bleeding-SDH, IVH Premorbid modified Rankin scale (mRS): 2  ROS: Unable to reliably obtain due to mental status  Past Medical History:  Diagnosis Date  . Anemia of chronic disease   . Coronary artery disease   . Dialysis patient ([place])    T, TH, Sat  . Dyslipidemia   . ESRD (end stage renal disease) on dialysis [facility])    "Industrial; T, Thurs, Sat" (12/26/2012)  . Gout   . History of blood transfusion   . Hypertension   . Metastatic cancer to bone ([place]) dx'd 04/04/17  . MYOCARDIAL INFARCTION, ACUTE, NON-Q WAVE 04/26/2009   Qualifier: Diagnosis of  By: Percival Spanish, MD, Farrel Gordon    . Obesity   . Prostate CA ([place]) dx'd 12/2014   initial dx  . Type II  diabetes mellitus ([place])    "diet controlled" (12/26/2012)   Family History  Problem Relation Age of Onset  . Stroke Mother   . Diabetes Sister    Social History:   reports that he quit smoking about 24 years ago. His smoking use included cigarettes. He has a 40.00 pack-year smoking history. He has never used smokeless tobacco. He reports that he does not drink alcohol and does not use drugs.  Medications  Current Facility-Administered Medications:  .  acetaminophen (TYLENOL) tablet 650 mg, 650 mg, Oral, Q6H PRN, 650 mg at 10/03/19 0519 **OR** acetaminophen (TYLENOL) suppository 650 mg, 650 mg, Rectal, Q6H PRN, Lilland, Alana, DO .  [START ON 10/04/2019] Chlorhexidine Gluconate Cloth 2 % PADS 6 each, 6 each, Topical, Q0600, Alric Seton, PA-C .  [START ON 10/04/2019] doxercalciferol (HECTOROL) injection 7 mcg, 7 mcg, Intravenous, Q T,Th,Sa-HD, Alric Seton, PA-C .  enzalutamide Gillermina Phy) capsule 160 mg, 160 mg, Oral, Daily, Lilland, Alana, DO .  feeding supplement (NEPRO CARB STEADY) liquid 237 mL, 237 mL, Oral, BID BM, Alric Seton, PA-C .  multivitamin (RENA-VIT) tablet 1 tablet, 1 tablet, Oral, QHS, Alric Seton, PA-C .  [START ON 10/04/2019] pantoprazole (PROTONIX) injection 40 mg, 40 mg, Intravenous, Q24H, Mullis, Kiersten P, DO .  piperacillin-tazobactam (ZOSYN) IVPB 2.25 g, 2.25 g, Intravenous, Q8H, de Marco, Mia, Student-PharmD, Last Rate: 100 mL/hr at 10/03/19 1541, 2.25 g at 10/03/19 1541 .  [START ON 10/04/2019] vancomycin (VANCOCIN) IVPB 750 mg/150 ml premix, 750 mg, Intravenous, Q T,Th,Sa-HD, de Marco, Mia, Student-PharmD  Exam: Current vital signs: BP (!) 100/42 (BP Location: Left Arm)   Pulse 76   Temp 98.8 F (37.1 C)   Resp 16   Wt 70.3 kg   SpO2 100%   BMI 21.02 kg/m  Vital signs in last 24 hours: Temp:  [97.3 F (36.3 C)-101 F (38.3 C)] 98.8 F (37.1 C) (09/24 1307) Pulse Rate:  [71-86] 76 (09/24 1307) Resp:  [16-25] 16 (09/24 1307) BP:  (91-132)/(18-70) 100/42 (09/24 1307) SpO2:  [89 %-100 %] 100 % (09/24 1000) Weight:  [70.3 kg] 70.3 kg (09/24 0500) General: Lethargic, laying comfortably in bed. HEENT: Was polychromatic CVS: Regular rhythm Respiratory: Breathing well saturating normally on room air Extremities warm well perfused Neurological exam Lethargic, opens eyes to voice. Does not follow commands Not much speech output-was able to say yes or no but inconsistently. Cranial nerves: Pupils equal round react light, extraocular wounds intact, visual fields full, blinks to threat from both sides, face appears symmetric, tongue appears midline. Motor exam: Both upper extremities are symmetric and barely antigravity but falls to bed before 10 seconds.  Both lower extremities that she is able to wiggle toes to command. Sensory exam: Grossly appears intact NIH stroke scale 1a Level of Conscious.: 1 1b LOC Questions: 2 1c LOC Commands: 0 2 Best Gaze: 0 3 Visual: 0 4 Facial Palsy: 0 5a Motor Arm - left: 1 5b Motor Arm - Right: 1 6a Motor Leg - Left: 2 6b Motor Leg - Right: 2 7 Limb Ataxia: 0 8 Sensory: 0 9 Best Language: 2 10 Dysarthria: 0 11 Extinct. and Inatten.: 0 TOTAL: 11   Labs I have reviewed labs in epic and the results pertinent to this consultation are:  CBC    Component Value Date/Time   WBC 9.0 10/03/2019 0058   RBC 2.99 (L) 10/03/2019 0058   HGB 8.2 (L) 10/03/2019 0058   HGB 8.4 (L) 09/10/2019 0939   HGB 12.4 (L) 09/29/2016 0810   HCT 25.0 (L) 10/03/2019 0058   HCT 26.5 (L) 07/08/2017 1530   HCT 37.4 (L) 09/29/2016 0810   PLT 289 10/03/2019 0058   PLT 304 09/10/2019 0939   PLT 234 09/29/2016 0810   MCV 83.6 10/03/2019 0058   MCV 93.0 09/29/2016 0810   MCH 27.4 10/03/2019 0058   MCHC 32.8 10/03/2019 0058   RDW 17.3 (H) 10/03/2019 0058   RDW 14.7 (H) 09/29/2016 0810   LYMPHSABS 0.9 10/01/2019 0937   LYMPHSABS 1.2 09/29/2016 0810   MONOABS 1.0 10/01/2019 0937   MONOABS 0.5 09/29/2016  0810   EOSABS 0.0 10/01/2019 0937   EOSABS 0.1 09/29/2016 0810   BASOSABS 0.0 10/01/2019 0937   BASOSABS 0.0 09/29/2016 0810    CMP     Component Value Date/Time   NA 137 10/03/2019 0058   NA 143 09/29/2016 0810   K 3.8 10/03/2019 0058   K 4.3 09/29/2016 0810   CL 95 (L) 10/03/2019 0058   CO2 26 10/03/2019 0058   CO2 31 (H) 09/29/2016 0810   GLUCOSE 115 (H) 10/03/2019 0058   GLUCOSE 91 09/29/2016 0810   BUN 36 (H) 10/03/2019 0058   BUN 27.9 (H) 09/29/2016 0810   CREATININE 4.10 (H) 10/03/2019 0058   CREATININE 3.87 (HH) 07/23/2019 0829   CREATININE 7.4 (HH) 09/29/2016 0810   CALCIUM 7.7 (L) 10/03/2019 0058   CALCIUM 9.9 09/29/2016 0810   PROT 7.6 10/01/2019 0937   PROT 5.9 (L) 09/24/2017 1004   PROT 7.5 09/29/2016 0810   ALBUMIN  1.8 (L) 10/03/2019 0058   ALBUMIN 3.2 (L) 09/24/2017 1004   ALBUMIN 3.5 09/29/2016 0810   AST 45 (H) 10/01/2019 0937   AST 19 07/23/2019 0829   AST 21 09/29/2016 0810   ALT 14 10/01/2019 0937   ALT 8 07/23/2019 0829   ALT 10 09/29/2016 0810   ALKPHOS 48 10/01/2019 0937   ALKPHOS 141 09/29/2016 0810   BILITOT 0.5 10/01/2019 0937   BILITOT 0.6 07/23/2019 0829   BILITOT 0.48 09/29/2016 0810   GFRNONAA 12 (L) 10/03/2019 0058   GFRNONAA 13 (L) 07/23/2019 0829   GFRAA 14 (L) 10/03/2019 0058   GFRAA 15 (L) 07/23/2019 0829   Imaging I have reviewed the images obtained:  CT-scan of the brain on admission was negative for any acute process.   MRI examination of the brain done today reveals left hemispheric subdural hematoma measuring 1 cm. Based on appearance, only on DWI - could be hyperacute to acute.  Mild IVH and the anterior horn of the left lateral ventricle and bilateral posterior horns of the lateral ventricles.   Assessment:  86 year old man with altered mental status, noted to have hyperacute acute subdural hematoma on the left side along with mild IVH and anterior left and bilateral posterior lateral ventricles. No history of  trauma.  Unclear etiology. Examination-patient appears encephalopathic and aphasic.  Rule out seizures.  Impression: Subdural hematoma Encephalopathy and aphasia Rule out seizures   Recommendations: Load Keppra 1 g IV now Keppra 500 twice daily IV going forward Seizure precautions Repeat CT head in the morning to ensure stability of the bleeds and no extension. Appreciate primary team reaching out to neurosurgery and appreciate neurosurgical consultation. Management of toxic metabolic derangements including ESRD per primary team as you are.  Plan discussed with the family medicine teaching service resident physicians and attending physician Dr. Thompson Grayer on the floor at the patient's bedside.  -- Amie Portland, MD [facility] Pager: [phone] If 7pm to 7am, please call on call as listed on AMION.

## 2019-10-03 NOTE — Progress Notes (Addendum)
2nd page sent to [facility] Neurosurgery answering service at [phone].  Gladys Damme, MD [facility], PGY-2

## 2019-10-03 NOTE — Progress Notes (Addendum)
Progress Note  Patient Name: [name] [name] Date of Encounter: 10/03/2019  [facility] Cardiologist: Minus Breeding, MD   Subjective   No acute overnight events. Saw patient after he came back from EGD. He is very lethargic. He was only able to tell me his name but could not tell me his date of birth, where we were, or what year it was. Was not able to answer many questions but did deny any chest pain or shortness of breath. He feels warm to the touch and was noted to have a fever as high as 101 this morning.   No family present at bedside.  Inpatient Medications    Scheduled Meds: . [MAR Hold] Chlorhexidine Gluconate Cloth  6 each Topical Q0600  . [MAR Hold] enzalutamide  160 mg Oral Daily  . [MAR Hold] pantoprazole (PROTONIX) IV  40 mg Intravenous Q12H  . [MAR Hold] sodium chloride flush  3 mL Intravenous Q12H  . [MAR Hold] sodium chloride flush  3 mL Intravenous Q12H   Continuous Infusions: . sodium chloride    . [MAR Hold] sodium chloride    . sodium chloride 10 mL/hr at 10/03/19 0742  . [MAR Hold] piperacillin-tazobactam (ZOSYN)  IV 2.25 g (10/03/19 0500)  . [MAR Hold] vancomycin     PRN Meds: [MAR Hold] sodium chloride, [MAR Hold] acetaminophen **OR** [MAR Hold] acetaminophen, [MAR Hold] sodium chloride flush   Vital Signs    Vitals:   10/03/19 0846 10/03/19 0855 10/03/19 0905 10/03/19 0915  BP: (!) 92/23 (!) 93/34 (!) 93/18 (!) 96/30  Pulse: 71 73 72 74  Resp: 16 18 16 20   Temp:      TempSrc:      SpO2: 100% 100% 100% 100%  Weight:        Intake/Output Summary (Last 24 hours) at 10/03/2019 0919 Last data filed at 10/03/2019 0844 Gross per 24 hour  Intake 1199.25 ml  Output 2000 ml  Net -800.75 ml   Last 3 Weights 10/03/2019 10/02/2019 10/02/2019  Weight (lbs) 154 lb 15.7 oz 152 lb 5.4 oz 157 lb 13.6 oz  Weight (kg) 70.3 kg 69.1 kg 71.6 kg      Telemetry    Normal sinus rhythm with rates in the 80's. Frequent PACs, sometimes in a bigeminy pattern. Also  has some PVCs. - Personally Reviewed  ECG    No new ECG tracing today. - Personally Reviewed  Physical Exam   GEN: No acute distress.   Neck: Supple. Cardiac: RRR with some ectopy. No murmurs, rubs, or gallops.  Respiratory: Clear to auscultation bilaterally. GI: Soft and non-distended. Tender to palpation.  MS: No lower extremity edema. No deformity. Neuro: Lethargic and still confused but able to follow commands. No focal deficits.  Psych: Lethargic.   Labs    High Sensitivity Troponin:   Recent Labs  Lab 10/01/19 1251 10/02/19 0703 10/02/19 1311  TROPONINIHS 162* 14,481* 14,617*      Chemistry Recent Labs  Lab 10/01/19 0937 10/02/19 0703 10/03/19 0058  NA 134* 132* 137  K 4.3 4.7 3.8  CL 89* 92* 95*  CO2 30 22 26   GLUCOSE 84 96 115*  BUN 48* 67* 36*  CREATININE 5.02* 6.18* 4.10*  CALCIUM 8.6* 7.7* 7.7*  PROT 7.6  --   --   ALBUMIN 2.2* 1.9* 1.8*  AST 45*  --   --   ALT 14  --   --   ALKPHOS 48  --   --   BILITOT 0.5  --   --  GFRNONAA 10* 7* 12*  GFRAA 11* 9* 14*  ANIONGAP 15 18* 16*     Hematology Recent Labs  Lab 10/01/19 0937 10/02/19 0703 10/03/19 0058  WBC 8.3 7.9 9.0  RBC 2.53* 2.83* 2.99*  HGB 6.3* 7.4* 8.2*  HCT 21.0* 22.9* 25.0*  MCV 83.0 80.9 83.6  MCH 24.9* 26.1 27.4  MCHC 30.0 32.3 32.8  RDW 19.1* 17.1* 17.3*  PLT 353 302 289    BNPNo results for input(s): BNP, PROBNP in the last 168 hours.   DDimer No results for input(s): DDIMER in the last 168 hours.   Radiology    CT HEAD WO CONTRAST  Result Date: 10/01/2019 CLINICAL DATA:  Altered mental status EXAM: CT HEAD WITHOUT CONTRAST TECHNIQUE: Contiguous axial images were obtained from the base of the skull through the vertex without intravenous contrast. COMPARISON:  None. FINDINGS: Brain: There is atrophy and chronic small vessel disease changes. No acute intracranial abnormality. Specifically, no hemorrhage, hydrocephalus, mass lesion, acute infarction, or significant  intracranial injury. Vascular: No hyperdense vessel or unexpected calcification. Skull: No acute calvarial abnormality. Sinuses/Orbits: Near complete opacification of the left paranasal sinuses. Mucosal thickening throughout the right paranasal sinuses. Mastoid air cells clear. Other: None IMPRESSION: Atrophy, chronic microvascular disease. No acute intracranial abnormality. Chronic sinusitis. Electronically Signed   By: Rolm Baptise M.D.   On: 10/01/2019 10:15   DG Chest Portable 1 View  Result Date: 10/01/2019 CLINICAL DATA:  Weakness. Additional provided: Patient reports weakness and lethargy since returning from dialysis yesterday. EXAM: PORTABLE CHEST 1 VIEW COMPARISON:  Prior chest radiograph 07/06/2017 and earlier. FINDINGS: Prior median sternotomy and CABG. Stable, mild cardiomegaly. Aortic atherosclerosis. No appreciable airspace consolidation or pulmonary edema. No evidence of pleural effusion or pneumothorax. No acute bony abnormality identified. IMPRESSION: No evidence of acute cardiopulmonary abnormality. Stable mild cardiomegaly status post CABG. Aortic Atherosclerosis (ICD10-I70.0). Electronically Signed   By: Kellie Simmering DO   On: 10/01/2019 09:37    Cardiac Studies   Echo pending.  Patient Profile     86 y.o. male with a history of AD s/p CABG in 2014 (LIMA to LAD, SVG to intermediate, SVG to OM, SVG to PDA), ESRD on hemodialysis, hypertension, dyslipidemia  type 2 diabetes, anemia of chronic disease who presented with altered mental status and confusion. Cardiology consulted for evaluation of elevated troponin.  Assessment & Plan    NSTEMI History of CAD s/p CABG in 2014 - High-sensitvity troponin elevated at 162 >> 14,481 >> 14,617.  - EKG showed T wave inversions in inferior and lateral leads.  - Echo pending.  - Denies any angina. - Patient is not a candidate for intervention given severe anemia, advanced age, severe comorbid illnesses with failure to thrive. Will plan to  treat conservatively at this time.  - Per GI, "Aspirin may be playing a role in his heme positivity and blood loss. Cessation of Aspirin is sometimes helpful in controlling the chronic occult or intermittent blood loss in elderly patients." However, they felt it may be "reasonable to resume Aspirin a this time with the intent of stopping it if recurrent or progressive anemia, associated with heme positive and frequent transfusions becomes an issue." Will discuss with Dr. Percival Spanish on whether or not to resume Aspirin.  Acute on Chronic Anemia Secondary to GI Bleed - Hemoglobin 6.3 on admission. Received 2 units of PRBCs. Stable at 8.2 today. - Hemoccult positive. - EGD today showed patchy moderately erythematous and edematous mucosa without active bleeding and with no  stigmata of bleeding was found in the second portion of the duodenum. No ulcers. - Management per primary team.  Fever - Patient had fever of 101 this morning.  - Etiology unclear. No leukocytosis. Lactic acid and procalcitonin normal. Blood culture negative to date. Urinalysis pending. Chest x-ray showed no acute findings.  - Patient did have some tenderness with palpation of abdomen.  - Currently on empiric antibiotics with Zosyn.  - Management per primary team.   Hypertension - BP soft but stable.  - Home Amlodipine on hold for now.  Dyslipidemia - Can restart home Lipitor if cleared for PO meds after swallow study.  ESRD on Hemodialysis - Management per Nephrology.  Prostate Cancer - Followed by Dr. Alen Blew as an outpatient.   For questions or updates, please contact [place] Please consult [email or web address] for contact info under        Signed, Darreld Mclean, PA-C  10/03/2019, 9:19 AM    History and all data above reviewed.  Patient examined.  I agree with the findings as above.  The patient is barely responsive.  Denies pain and reports that he is breathing OK.  The patient exam reveals COR:RRR  ,  Lungs:  Decreased breath sound without crackles  ,  Abd: Mild tenderness to palpation.  , Ext No edema  .  All available labs, radiology testing, previous records reviewed. Agree with documented assessment and plan.  NQWMI :  Not on anticoagulation secondary to GI blood loss.  Not a candidate for any cardiac intervention/evaluation.  Conservative management and goals of therapy discussions are appropriate.  BP will not allow med titration.  The only active question is ASA or not.  Since I believe the anemia precipitated the NSTEMI, I would suggest holding off on ASA for 2 to 4 weeks and , pending recovery from his acute and chronic illness, then considering restarting.      [name] Erdine Hulen  12:50 PM  10/03/2019

## 2019-10-03 NOTE — Progress Notes (Signed)
SLP Cancellation Note  Patient Details Name: [name] [name] MRN: [id number] DOB: Jun 22, 1930   Cancelled treatment:       Reason Eval/Treat Not Completed: Patient at procedure or test/unavailable. NPO for endoscopy today   Lillyan Hitson, Katherene Ponto 10/03/2019, 7:46 AM

## 2019-10-03 NOTE — Progress Notes (Signed)
Was called to patient's room by patient's nurse after she checked his blood pressure and it was 98/30.  This is a manual blood pressure meeting the map was 48.  When I went into the room I woke the patient up and he was at baseline from previous evaluation.  I rechecked his blood pressure with a manual cuff on his left arm and the blood pressure was 106/42.  The rest of his vital signs were within normal limits with an oxygen saturation of 100% and a pulse rate of 71.  I reached out to neurology to discuss if there was a minimum map threshold that the patient needs to stay above.  They indicated that the main concern was the systolic blood pressure needed to remain below 140 but after review of the imaging it would be okay if he were slightly above that.  I discussed blood pressure goals with the nurses and asked that they increase his vitals to every 2 hours.  They will notify us of maps below 60.  Gifford Shave, MD [facility], PGY-2

## 2019-10-03 NOTE — Progress Notes (Signed)
Family Medicine Teaching Service Daily Progress Note Intern Pager: [phone]  Patient name: [name] [name] Medical record number: [id number] Date of birth: 29-Jun-1930 Age: 86 y.o. Gender: male  Primary Care Provider: [name], MD Consultants: GI, Nephrology Code Status: DNR  Pt Overview and Major Events to Date:  Admitted on 9/22  Assessment and Plan:  [name] [name] a 86 y.o.malepresenting with weakness and lethargy. PMH is significant forESRD, secondary hyperparathyroidism, HLD, gout, HTN, CAD s/p CABG(2014), anemia, occult blood in stools, prostatic cancer.   Acute change in Mental status Confusion Weakness Patient presented to the [place] 1 week history of change in mental status, weakness, confusion per wife with 1 month history of decreased PO intake and weight loss (6lbs in 1 month).On arrival to the ED patient was A&O x4,however on admissionpatient was A&Ox1 (oriented to self,intermittently to place).Per wife, at baseline patient is able to complete ADLs and some IADL's.Unclear etiology of confusion as patient does not have overt signs of infection/sepsis orstroke.EtOH level wnl.BUN 48. DDx: Symptomatic anemia(although patient has long history of acute symptomatic anemia in past without change associated change in mental status),CNS infection (less likely due to normal WBC, no nuchal rigidity),acute neurologic event (less likely as CT head was negative for acute process, patient has no focal neurologic deficits), consider patient induced/drug intoxication (less likely, EtOH level normal, patient has no history of drug use), bacteremia (less likely due to normal WBC, and no systemic symptoms, with no true documented fever since arrival to ED, and normal lactic acid), cardiac etiology (although no murmurs appreciated). TSH WNL. Ammonia WNL. Procal is elevated at 0.74. MRI ordered. -MRI ordered -Follow-up UDS, GI panel - neuro check q4 hours  -  trend trops, repeat EKG - PT/OT recs   Symptomatic anemia,in the setting of chronic anemia Patient presented to the ED with weakness and lethargy since his hemodialysis session yesterday(9/21).Patient with history of chronically low Hgb in setting of anemia of CKD and malignancy, possible MDS(?). Patient receivesregular transfusions in the outpatient setting with oncology. Last documented transfusion was in May 2021. Transfusion threshold is 8 given CAD.  Has had total of 3 units of RBC, post transfusion H&H 8.2. EGD showed no active bleeds. Recommend no further intervention, changing Protonix from BID to daily, and  restarting aspirin.  -IV Protonix 40 daily   Fever of unknown source Over night max temp of 101. There is no obvious source for infection at the moment. Physical exam unremarkable with no nuchal rigidity.CXR negative for acute process.Hemodynamically stable on room air. BSW:HQPRFFMB gastrointestinal cause (patient has diarrhea), urinary cause (patient is reportedly oliguric per wifewho reports he has not complained of symptoms), cardiac cause (however no new murmurs) Overnight temp of 101 -Continue IV Zosyn 2.25g q8 -Continue IV Vancomycin 750 mg T/Th/Sa -Follow-up blood cultures -Follow-up GI panel and C. difficile -Follow-up UAwithculture -Continue to monitor temperature -Consider echo if continues to fever   Chest pain Elevated Trop Patient complained of chest pain in theED, however denied during admission interview. No baseline trop to compare to given ESRD. CXR: No evidence of acute cardiopulmonary abnormality; stable mild cardiomegaly status post CABG; aortic atherosclerosis. EKG with some concern for ST depression),possibly was truly abdominal/epigastric pain in the setting of possible GI bleed.Elevated trop may be due to demand ischemia in setting [place] [address], STAT EKG ordered, Cardiology consulted -Appreciate  Cardiology recommendations -Follow-uprepeattroponin, EKG -Follow-up blood culture -Continue to monitor   ESRD Patient has a history of ESRD andistreated with HD (T/Th/Sa).Received  dialysis yesterday 9/23. Na 137, K 3.8, Cl 95, Cr 4.10. Next dialysis should be tomorrow. -Nephrology consulted for hemodialysis -Monitor with daily CMP   Prostatic cancer Patient is followed by oncology(Dr. 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 Patient regularly gets hemoglobin checked with oncology, and has had several transfusions, the last one being on 05/21/2019. Homemedications include currently taking Xtandi 160 mg daily (4 doses of 40 mg),and leuprolide 30 mg injection every 4 months(last dosegiven on 9/21 per wife). -Continue home [place]    Hypertension Patient blood pressures on admission ranged between 96-126/39-69. Current homemeds include amlodipine 5 mg. Due to some low blood pressures and low diastolic pressures, home amlodipine will be held. -Hold home amlodipine -Continue to monitor blood pressures   FEN/GI: Renal Diet with 1200 cc fluid restriction PPx: SCD's  Disposition: Progressive  Prior to Admission Living Arrangement: Home Anticipated Discharge Location: Home Barriers to Discharge: AMS Anticipated discharge in approximately 1-4 day(s).   Subjective:  Interviewed patient at bedside.  He was minimally responsive to questioning. He would look around to questioning and make non-distinct noises to questions. He appeared to shake his head no when asked if he had any pain.    Objective: Temp:  [97.3 F (36.3 C)-101 F (38.3 C)] 101 F (38.3 C) (09/24 0500) Pulse Rate:  [79-97] 82 (09/24 0500) Resp:  [16-28] 20 (09/24 0500) BP: (100-141)/(45-88) 108/54 (09/24 0500) SpO2:  [89 %-100 %] 98 % (09/23 2344) Weight:  [69.1 kg-71.6 kg] 70.3 kg (09/24  0500) Physical Exam:  Physical Exam Vitals and nursing note reviewed.  Constitutional:      General: He is not in acute distress.    Appearance: He is not ill-appearing, toxic-appearing or diaphoretic.  HENT:     Head: Normocephalic and atraumatic.  Cardiovascular:     Rate and Rhythm: Normal rate and regular rhythm.     Pulses: Normal pulses.          Radial pulses are 2+ on the right side and 2+ on the left side.       Dorsalis pedis pulses are 2+ on the right side and 2+ on the left side.     Heart sounds: Normal heart sounds, S1 normal and S2 normal. No murmur heard.   Pulmonary:     Effort: Pulmonary effort is normal. No respiratory distress.     Breath sounds: Normal breath sounds. No wheezing.  Abdominal:     General: There is no distension.     Tenderness: There is no abdominal tenderness.  Musculoskeletal:     Right lower leg: No edema.     Left lower leg: No edema.      Laboratory: Recent Labs  Lab 10/01/19 0937 10/02/19 0703 10/03/19 0058  WBC 8.3 7.9 9.0  HGB 6.3* 7.4* 8.2*  HCT 21.0* 22.9* 25.0*  PLT 353 302 289   Recent Labs  Lab 10/01/19 0937 10/02/19 0703 10/03/19 0058  NA 134* 132* 137  K 4.3 4.7 3.8  CL 89* 92* 95*  CO2 30 22 26   BUN 48* 67* 36*  CREATININE 5.02* 6.18* 4.10*  CALCIUM 8.6* 7.7* 7.7*  PROT 7.6  --   --   BILITOT 0.5  --   --   ALKPHOS 48  --   --   ALT 14  --   --   AST 45*  --   --   GLUCOSE 84  96 115*    Mag: 1.9  Imaging/Diagnostic Tests:   Upper Endoscopy: A 1 cm hiatal hernia was present. The exam of the esophagus was otherwise normal. The entire examined stomach was normal. No blood present. The cardia and gastric fundus were normal on retroflexion. Patchy moderately erythematous and edematous mucosa without active bleeding and with no stigmata of bleeding was found in the second portion of the duodenum. Careful inspection for ulcers was negative.   CT Head WO Contrast: Atrophy, chronic microvascular disease.  No acute intracranial abnormality. Chronic sinusitis.   DG Chest Portable 1 View: No evidence of acute cardiopulmonary abnormality. Stable mild cardiomegaly status post CABG.   FMB:BUYZJ rhythm, and abnormal/inverted T waves in inferior lateral leads, unchanged from prior EKG in 2020   Briant Cedar, MD 10/03/2019, 6:22 AM PGY-1, [place] Intern pager: [phone], text pages welcome

## 2019-10-03 NOTE — Progress Notes (Signed)
FPTS Interim Progress Note  S: Went to see patient for baseline nightly evaluation.  Patient laying in bed comfortably sleeping.  He arouses easily and is able to look at me without difficulty as well as my colleague.  Patient mainly responds yes to questions although when asked if he was having any pain he shook his head in the "yes was not is clear why he was trying to say something else.  Patient was able to follow commands and squeeze my hands as well as wiggle his toes.  O: BP (!) 100/42 (BP Location: Left Arm)   Pulse 76   Temp 98.8 F (37.1 C)   Resp 16   Wt 70.3 kg   SpO2 100%   BMI 21.02 kg/m   General: Sleeping comfortably when we entered the room, no acute distress Respiratory: Normal work of breathing, no tachypnea noted Neurologic, patient able to follow commands, grip strength intact bilaterally, able to wiggle his toes.  Patient is unable to lift his arms or legs against gravity.  Sensation is intact in upper and lower extremities.  Extraocular eye movement intact.  Patient unable to answer questions in full sentences   A/P: Subdural hematoma Hemorrhagic bleed was discovered today MR head.  Neurology was consulted.  They recommended neurosurgery consultation as well as Keppra and EEG.  Neurosurgery is also evaluated the patient and drop the recommendations. -Neurosurgery recommends AED prophylaxis as well as EEG -Patient received Keppra loading dose 1 g IV this morning, 500 mg twice daily IV moving forward -Seizure precautions -They also recommend repeat CT head in the morning and if stable patient will require another CT head in 2 weeks -Blood pressure goal with systolic less than 771 -Blood sugar goal 140-180  Gifford Shave, MD 10/03/2019, 9:02 PM PGY-2, [place] Medicine Service pager [phone]

## 2019-10-03 NOTE — Progress Notes (Signed)
°  Echocardiogram 2D Echocardiogram has been performed.  [name] [name] 10/03/2019, 4:31 PM

## 2019-10-03 NOTE — Progress Notes (Signed)
OT Cancellation Note  Patient Details Name: [name] [name] MRN: [id number] DOB: 05/22/1930   Cancelled Treatment:    Reason Eval/Treat Not Completed: Patient at procedure or test/ unavailable (Pt OTF for endoscopy.Will continue to follow as available and appropriate to initiate OT POC)  Zenovia Jarred, MSOT, OTR/L [place] [facility] Office Number: [phone] Pager: [phone]  Zenovia Jarred 10/03/2019, 8:29 AM

## 2019-10-04 ENCOUNTER — Inpatient Hospital Stay (HOSPITAL_COMMUNITY): Payer: Medicare Other

## 2019-10-04 DIAGNOSIS — I214 Non-ST elevation (NSTEMI) myocardial infarction: Secondary | ICD-10-CM

## 2019-10-04 DIAGNOSIS — G92 Toxic encephalopathy: Secondary | ICD-10-CM

## 2019-10-04 DIAGNOSIS — Z515 Encounter for palliative care: Secondary | ICD-10-CM

## 2019-10-04 DIAGNOSIS — Z7189 Other specified counseling: Secondary | ICD-10-CM

## 2019-10-04 DIAGNOSIS — I6200 Nontraumatic subdural hemorrhage, unspecified: Secondary | ICD-10-CM

## 2019-10-04 LAB — CBC
HCT: 25.6 % — ABNORMAL LOW (ref 39.0–52.0)
Hemoglobin: 8.3 g/dL — ABNORMAL LOW (ref 13.0–17.0)
MCH: 27.2 pg (ref 26.0–34.0)
MCHC: 32.4 g/dL (ref 30.0–36.0)
MCV: 83.9 fL (ref 80.0–100.0)
Platelets: 278 10*3/uL (ref 150–400)
RBC: 3.05 MIL/uL — ABNORMAL LOW (ref 4.22–5.81)
RDW: 18.2 % — ABNORMAL HIGH (ref 11.5–15.5)
WBC: 9 10*3/uL (ref 4.0–10.5)
nRBC: 0 % (ref 0.0–0.2)

## 2019-10-04 LAB — RENAL FUNCTION PANEL
Albumin: 1.6 g/dL — ABNORMAL LOW (ref 3.5–5.0)
Anion gap: 18 — ABNORMAL HIGH (ref 5–15)
BUN: 57 mg/dL — ABNORMAL HIGH (ref 8–23)
CO2: 23 mmol/L (ref 22–32)
Calcium: 7.3 mg/dL — ABNORMAL LOW (ref 8.9–10.3)
Chloride: 98 mmol/L (ref 98–111)
Creatinine, Ser: 6.08 mg/dL — ABNORMAL HIGH (ref 0.61–1.24)
GFR calc Af Amer: 9 mL/min — ABNORMAL LOW (ref >60)
GFR calc non Af Amer: 8 mL/min — ABNORMAL LOW (ref >60)
Glucose, Bld: 98 mg/dL (ref 70–99)
Phosphorus: 4 mg/dL (ref 2.5–4.6)
Potassium: 3.9 mmol/L (ref 3.5–5.1)
Sodium: 139 mmol/L (ref 135–145)

## 2019-10-04 LAB — GLUCOSE, CAPILLARY
Glucose-Capillary: 100 mg/dL — ABNORMAL HIGH (ref 70–99)
Glucose-Capillary: 117 mg/dL — ABNORMAL HIGH (ref 70–99)
Glucose-Capillary: 125 mg/dL — ABNORMAL HIGH (ref 70–99)
Glucose-Capillary: 140 mg/dL — ABNORMAL HIGH (ref 70–99)
Glucose-Capillary: 92 mg/dL (ref 70–99)
Glucose-Capillary: 98 mg/dL (ref 70–99)

## 2019-10-04 MED ORDER — SODIUM CHLORIDE 0.9 % IV SOLN
1.5000 g | Freq: Two times a day (BID) | INTRAVENOUS | Status: DC
  Administered 2019-10-04 – 2019-10-05 (×3): 1.5 g via INTRAVENOUS
  Filled 2019-10-04 (×4): qty 4

## 2019-10-04 MED ORDER — DEXTROSE 50 % IV SOLN
1.0000 | Freq: Once | INTRAVENOUS | Status: AC
  Administered 2019-10-04: 50 mL via INTRAVENOUS
  Filled 2019-10-04: qty 50

## 2019-10-04 MED ORDER — ACETAMINOPHEN 650 MG RE SUPP
650.0000 mg | Freq: Three times a day (TID) | RECTAL | Status: DC
  Administered 2019-10-04 – 2019-10-05 (×4): 650 mg via RECTAL
  Filled 2019-10-04 (×3): qty 1

## 2019-10-04 MED ORDER — BISACODYL 10 MG RE SUPP: 10.0000 mg | Freq: Every day | RECTAL | Status: DC | PRN

## 2019-10-04 NOTE — Progress Notes (Signed)
Neurology Progress Note   S:// Seen and examined this morning. Overnight hypotensive episodes. EEG with moderate diffuse encephalopathy nonspecific etiology. No seizures epileptiform discharges seen throughout the recording.   O:// Current vital signs: BP (!) 95/38 (BP Location: Left Arm)   Pulse 76   Temp 98.6 F (37 C) (Oral)   Resp 18   Wt 70.5 kg   SpO2 100%   BMI 21.08 kg/m  Vital signs in last 24 hours: Temp:  [98 F (36.7 C)-99 F (37.2 C)] 98.6 F (37 C) (09/25 0630) Pulse Rate:  [68-82] 76 (09/25 0630) Resp:  [16-22] 18 (09/25 0630) BP: (85-102)/(18-45) 95/38 (09/25 0630) SpO2:  [98 %-100 %] 100 % (09/25 0630) Weight:  [70.5 kg] 70.5 kg (09/25 0500) General: Lethargic, comfortably laying in bed, opens eyes to voice. HEENT: Normocephalic, atraumatic CVS: Regular rate rhythm respiratory: Breathing well saturating normally on room air Neurological exam Lethargic, opens eyes to voice Does not follow commands and no speech output. Cranial nerves: Pupils equal round react light, extraocular movements intact, visual fields full to threat as evidenced by blink to threat from both sides, face symmetric. Motor exam: Symmetric withdrawal of all extremities and grimacing to noxious stimulation. Sensory exam: Above  Medications  Current Facility-Administered Medications:  .  acetaminophen (TYLENOL) tablet 650 mg, 650 mg, Oral, Q6H PRN, 650 mg at 10/03/19 0519 **OR** acetaminophen (TYLENOL) suppository 650 mg, 650 mg, Rectal, Q6H PRN, Lilland, Alana, DO .  Chlorhexidine Gluconate Cloth 2 % PADS 6 each, 6 each, Topical, Q0600, Alric Seton, PA-C, 6 each at 10/04/19 0535 .  dextrose 5 %-0.9 % sodium chloride infusion, , Intravenous, Continuous, Gladys Damme, MD, Last Rate: 50 mL/hr at 10/03/19 2015, Rate Verify at 10/03/19 2015 .  doxercalciferol (HECTOROL) injection 7 mcg, 7 mcg, Intravenous, Q T,Th,Sa-HD, Alric Seton, PA-C .  enzalutamide Gillermina Phy) capsule 160 mg,  160 mg, Oral, Daily, Lilland, Alana, DO .  feeding supplement (NEPRO CARB STEADY) liquid 237 mL, 237 mL, Oral, BID BM, Alric Seton, PA-C .  [COMPLETED] levETIRAcetam (KEPPRA) IVPB 1000 mg/100 mL premix, 1,000 mg, Intravenous, Once, Last Rate: 400 mL/hr at 10/03/19 1856, 1,000 mg at 10/03/19 1856 **FOLLOWED BY** levETIRAcetam (KEPPRA) IVPB 500 mg/100 mL premix, 500 mg, Intravenous, Q24H, Gladys Damme, MD .  multivitamin (RENA-VIT) tablet 1 tablet, 1 tablet, Oral, QHS, Bergman, Martha, PA-C .  pantoprazole (PROTONIX) injection 40 mg, 40 mg, Intravenous, Q24H, Mullis, Kiersten P, DO .  piperacillin-tazobactam (ZOSYN) IVPB 2.25 g, 2.25 g, Intravenous, Q8H, de Marco, Mia, Student-PharmD, Last Rate: 100 mL/hr at 10/04/19 0524, 2.25 g at 10/04/19 0524 .  vancomycin (VANCOCIN) IVPB 750 mg/150 ml premix, 750 mg, Intravenous, Q T,Th,Sa-HD, de Dierdre Harness, Student-PharmD Labs CBC    Component Value Date/Time   WBC 9.0 10/04/2019 0453   RBC 3.05 (L) 10/04/2019 0453   HGB 8.3 (L) 10/04/2019 0453   HGB 8.4 (L) 09/10/2019 0939   HGB 12.4 (L) 09/29/2016 0810   HCT 25.6 (L) 10/04/2019 0453   HCT 26.5 (L) 07/08/2017 1530   HCT 37.4 (L) 09/29/2016 0810   PLT 278 10/04/2019 0453   PLT 304 09/10/2019 0939   PLT 234 09/29/2016 0810   MCV 83.9 10/04/2019 0453   MCV 93.0 09/29/2016 0810   MCH 27.2 10/04/2019 0453   MCHC 32.4 10/04/2019 0453   RDW 18.2 (H) 10/04/2019 0453   RDW 14.7 (H) 09/29/2016 0810   LYMPHSABS 0.9 10/01/2019 0937   LYMPHSABS 1.2 09/29/2016 0810   MONOABS 1.0 10/01/2019 9767  MONOABS 0.5 09/29/2016 0810   EOSABS 0.0 10/01/2019 0937   EOSABS 0.1 09/29/2016 0810   BASOSABS 0.0 10/01/2019 0937   BASOSABS 0.0 09/29/2016 0810    CMP     Component Value Date/Time   NA 139 10/04/2019 0453   NA 143 09/29/2016 0810   K 3.9 10/04/2019 0453   K 4.3 09/29/2016 0810   CL 98 10/04/2019 0453   CO2 23 10/04/2019 0453   CO2 31 (H) 09/29/2016 0810   GLUCOSE 98 10/04/2019 0453    GLUCOSE 91 09/29/2016 0810   BUN 57 (H) 10/04/2019 0453   BUN 27.9 (H) 09/29/2016 0810   CREATININE 6.08 (H) 10/04/2019 0453   CREATININE 3.87 (HH) 07/23/2019 0829   CREATININE 7.4 (HH) 09/29/2016 0810   CALCIUM 7.3 (L) 10/04/2019 0453   CALCIUM 9.9 09/29/2016 0810   PROT 7.6 10/01/2019 0937   PROT 5.9 (L) 09/24/2017 1004   PROT 7.5 09/29/2016 0810   ALBUMIN 1.6 (L) 10/04/2019 0453   ALBUMIN 3.2 (L) 09/24/2017 1004   ALBUMIN 3.5 09/29/2016 0810   AST 45 (H) 10/01/2019 0937   AST 19 07/23/2019 0829   AST 21 09/29/2016 0810   ALT 14 10/01/2019 0937   ALT 8 07/23/2019 0829   ALT 10 09/29/2016 0810   ALKPHOS 48 10/01/2019 0937   ALKPHOS 141 09/29/2016 0810   BILITOT 0.5 10/01/2019 0937   BILITOT 0.6 07/23/2019 0829   BILITOT 0.48 09/29/2016 0810   GFRNONAA 8 (L) 10/04/2019 0453   GFRNONAA 13 (L) 07/23/2019 0829   GFRAA 9 (L) 10/04/2019 0453   GFRAA 15 (L) 07/23/2019 0829    glycosylated hemoglobin  Lipid Panel     Component Value Date/Time   CHOL 103 09/24/2017 1004   TRIG 71 09/24/2017 1004   HDL 41 09/24/2017 1004   CHOLHDL 2.5 09/24/2017 1004   CHOLHDL 4.7 12/27/2012 0545   VLDL 27 12/27/2012 0545   LDLCALC 48 09/24/2017 1004   Ammonia 20 Creatinine 6.08, BUN 57.  Imaging I have reviewed images in epic and the results pertinent to this consultation are: MRI brain reviewed as in the initial consult note. Left frontal subdural hematoma with minimal IVH. CT on arrival also noted-had no abnormalities CT head this morning with difficult to visualize the IVH but noted isointense left frontal subdural with no significant increase.  Assessment: 86 year old man brought in for altered mental status-initial CT head negative but repeat MRI as a part of work-up revealed left convexity subdural hematoma with minimal IVH in the anterior left and bilateral posterior lateral ventricles. No history of trauma. Unclear etiology of the subdurals. Patient continues to appears  encephalopathic with no evidence of seizures on the EEG.  Impression: Left frontal subdural hematoma Encephalopathy and aphasia likely secondary to above-symptoms appear slightly out of proportion to the size of the subdural likely secondary to the chronic nature as well as poor brain reserve at baseline. Low likelihood of seizures  Recommendations: Continue Keppra 500 twice daily with additional 250mg  on dialysis days. Continue to correct toxic metabolic derangements per primary team as you are Continue to maintain seizure precautions If he has any urine production, check for any evidence of UTI. Chest x-ray was done 10/01/2019-repeat to evaluate for possible aspiration pneumonia given altered mental status that has been persistent Continue to have goals of care conversation with family Appreciate neurosurgical consultation.  Please call neurology as needed.   Plan relayed via epic secure message to primary team resident physician.  -- Amie Portland,  MD [facility] Pager: [phone] If 7pm to 7am, please call on call as listed on AMION.

## 2019-10-04 NOTE — Plan of Care (Signed)
  Problem: Nutrition: Goal: Adequate nutrition will be maintained Outcome: Not Progressing   Problem: Elimination: Goal: Will not experience complications related to urinary retention Outcome: Not Progressing   Problem: Pain Managment: Goal: General experience of comfort will improve Outcome: Not Progressing

## 2019-10-04 NOTE — Progress Notes (Addendum)
Family Medicine Teaching Service Daily Progress Note Intern Pager: [phone]  Patient name: [name] [name] Medical record number: [id number] Date of birth: 31-May-1930 Age: 86 y.o. Gender: male  Primary Care Provider: [name], MD Consultants: GI, Nephrology, Neurology, Neurosurgery Code Status: DNR  Pt Overview and Major Events to Date:  Admitted on 9/22 MR Head Critical Reading 9/25   Assessment and Plan:  [name] [name] a 86 y.o.malepresenting with weakness and lethargy. PMH is significant forESRD, secondary hyperparathyroidism, HLD, gout, HTN, CAD s/p CABG(2014), anemia, occult blood in stools, prostatic cancer.   Subdural Hematoma Acute change in Mental status Confusion Weakness Yesterday MR Head revealed Subdural Hematoma. Neurosurgery and Neurology were consulted. Neurology recommended beginning [place], obtain EEG, keeping CBG 140-180, initiate seizure precautions, repeat Chest X in AM to monitor for aspiration. Neurosurgery recommended repeat CT head and then repeat CT head in 2 weeks. Will consult Palliative care to assist in Goals of Care discussions with wife. Repeat CT Head showed no changes in subdural hemorrhage, no new hemorrhage, no progressive mass effect. -Keppra 500 BID -IV Dextrose-NS 75 cc/hr -CBG q8 -neuro check q4 hours  -PT/OT recs -1 View Chest X ordered -Consult Palliative Care   Symptomatic anemia,in the setting of chronic anemia Patient presented to the ED with weakness and lethargy since his hemodialysis session yesterday(9/21).Patient with history of chronically low Hgb in setting of anemia of CKD and malignancy, possible MDS(?). Patient receivesregular transfusions in the outpatient setting with oncology. Last documented transfusion was in May 2021. Transfusion threshold is 8 given CAD.  Has had total of 3 units of RBC, post transfusion H&H 8.2. EGD showed no active bleeds. Recommend no further intervention, changing  Protonix from BID to daily, and  restarting aspirin.  -Hgb is 8.3 -IV Protonix 40 daily   Fever of unknown source No fever overnight, max temp was 99. There is no obvious source for infection at the moment. Physical exam unremarkable.Hemodynamically stable on room air. JYN:[id number] gastrointestinal cause (patient has diarrhea), urinary cause (patient is reportedly oliguric per wifewho reports he has not complained of symptoms), cardiac cause (however no new murmurs) -Continue IV Zosyn 2.25g q8 -Continue IV Vancomycin 750 mg T/Th/Sa -Follow-up blood cultures -Follow-up GI panel and C. difficile -Follow-up UAwithculture -Continue to monitor temperature -Consider echo if continues to fever   Chest pain Elevated Trop CXR: No evidence of acute cardiopulmonary abnormality; stable mild cardiomegaly status post CABG; aortic atherosclerosis. EKG showed A flutter with predominant 3:1 AV block.Elevated trop may be due to demand ischemia in setting of symptomatic anemia.[place]roponin of [address] on 9/23. Cardiology consulted -Appreciate Cardiology recommendations -Follow-up blood culture -Continue to monitor   ESRD Patient has a history of ESRD andistreated with HD(T/Th/Sa).Received dialysis on 9/23. Na 137, K 3.8, Cl 95, Cr 4.10. Next dialysis should be today. -Nephrology consulted for hemodialysis -Monitor with daily CMP   Prostatic cancer Patient is followed by oncology(Dr. 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 Patient regularly gets hemoglobin checked with oncology, and has had several transfusions, the last one being on 05/21/2019. Homemedications include currently taking Xtandi 160 mg daily (4 doses of 40 mg),and leuprolide 30 mg injection every 4 months(last dosegiven on 9/21 per wife). -Continue home Xtandi    Hypertension Manual BP measured was  98/40. Current homemeds include amlodipine 5 mg. Due to some low blood pressures and low diastolic pressures, home amlodipine will be held. -Hold home amlodipine -Continue  to monitor blood pressures -IV fluids Dextrose-NS 75 cc/hr   FEN/GI: Renal Diet with 1200 cc fluid restriction Pending SLP, IV Dextrose-NS 75 cc/hr PPx: SCD's  Disposition: Progressive  Prior to Admission Living Arrangement: Home Anticipated Discharge Location: Unsure Barriers to Discharge: Goals of Care Anticipated discharge in approximately 1-4 day(s).   Subjective:  Interviewed patient at bedside.  He is still as alert as yesterday and still unable to answer questions with more than Yeah. When asked if he had any pain he seemed to deny any pain. Still able to focus on question asker.   Objective: Temp:  [97.9 F (36.6 C)-99 F (37.2 C)] 98.6 F (37 C) (09/25 0630) Pulse Rate:  [68-82] 76 (09/25 0630) Resp:  [16-25] 18 (09/25 0630) BP: (85-102)/(18-45) 95/38 (09/25 0630) SpO2:  [95 %-100 %] 100 % (09/25 0630) Weight:  [70.5 kg] 70.5 kg (09/25 0500) Physical Exam:  Physical Exam Constitutional:      General: He is not in acute distress.    Appearance: He is not ill-appearing, toxic-appearing or diaphoretic.  Cardiovascular:     Rate and Rhythm: Normal rate and regular rhythm.     Pulses: Normal pulses.          Radial pulses are 2+ on the right side and 2+ on the left side.       Dorsalis pedis pulses are 2+ on the right side and 2+ on the left side.     Heart sounds: Normal heart sounds, S1 normal and S2 normal.  Pulmonary:     Effort: Pulmonary effort is normal. No respiratory distress.     Breath sounds: Normal breath sounds.  Abdominal:     General: There is no distension.     Tenderness: There is no abdominal tenderness. There is no guarding.  Musculoskeletal:     Right lower leg: No edema.     Left lower leg: No edema.  Neurological:     Mental Status: He is alert.     Comments: Able  to move toes and feet when prompted Able to squeeze fingers when prompted       Laboratory: Recent Labs  Lab 10/03/19 0058 10/03/19 1735 10/04/19 0453  WBC 9.0 8.8 9.0  HGB 8.2* 8.5* 8.3*  HCT 25.0* 25.9* 25.6*  PLT 289 292 278   Recent Labs  Lab 10/01/19 0937 10/01/19 0937 10/02/19 0703 10/03/19 0058 10/04/19 0453  NA 134*   < > 132* 137 139  K 4.3   < > 4.7 3.8 3.9  CL 89*   < > 92* 95* 98  CO2 30   < > 22 26 23   BUN 48*   < > 67* 36* 57*  CREATININE 5.02*   < > 6.18* 4.10* 6.08*  CALCIUM 8.6*   < > 7.7* 7.7* 7.3*  PROT 7.6  --   --   --   --   BILITOT 0.5  --   --   --   --   ALKPHOS 48  --   --   --   --   ALT 14  --   --   --   --   AST 45*  --   --   --   --   GLUCOSE 84   < > 96 115* 98   < > = values in this interval not displayed.    PT-INR: 21.3 (high) APTT: 47 (high)   Imaging/Diagnostic Tests:   CT Head WO Contrast: When comparing across  modalities, similar size of an approximately 1.1 cm left frontal convexity subdural hemorrhage. Small volume of intraventricular hemorrhage, likely slightly decreased. No progressive ventriculomegaly. No evidence of new/acute hemorrhage or progressive mass effect. Left greater than right frontal sinus opacification. Correlate with signs/symptoms of sinusitis. Similar chronic microvascular ischemic disease.   MR Brain WO Contrast: New left frontal subdural hematoma measuring up to 1.2 cm with subarachnoid hemorrhage. Small volume left anterior and bilateral occipital horn intraventricular hemorrhage. Mild cerebral atrophy. Advanced chronic microvascular ischemic changes.   Upper Endoscopy: A 1 cm hiatal hernia was present. The exam of the esophagus was otherwise normal. The entire examined stomach was normal. No blood present. The cardia and gastric fundus were normal on retroflexion. Patchy moderately erythematous and edematous mucosa without active bleeding and with no stigmata of bleeding was found in the second  portion of the duodenum. Careful inspection for ulcers was negative.   CT Head WO Contrast:Atrophy, chronic microvascular disease. No acute intracranialabnormality. Chronic sinusitis.   DG Chest Portable 1 View:No evidence of acute cardiopulmonary abnormality. Stable mild cardiomegaly status post CABG.   LWU:ZRVUF rhythm, and abnormal/inverted T waves in inferior lateral leads, unchanged from prior EKG in 2020   Briant Cedar, MD 10/04/2019, 6:31 AM PGY-1, [facility] Intern pager: [phone], text pages welcome\

## 2019-10-04 NOTE — Progress Notes (Signed)
[facility] Progress Note   Dialysis Orders: TTS [facility]  4h  69.5kg  Hep none 2/2.25 bath  R AVF - good compliance, getting to edw - sp IV Fe load  - mircera 225 q2 last 9/14, due 9/28  - hect 7 ug tiw  - 9/21- Hb 6.4, has been < 7.6 all september   Assessment/ Plan: 1. AMS - has underlying pretty severe dementia but at baseline could carry on a conversation. Mentation here is poor. Seen by neurology/ neurosurg. Has L frontal SDH, encephalopathy and aphasia likely some chronic SDH effect along w/ poor brain reserve. EEG. Getting Keppra.  2. Anemia - due to CKD +/- GIB. EGD - erythematous/peptic duodenitis - no ulder or explanation for dark stools - 2 units PRBC since admission - no evidence of ongoing blood loos - GI does not recommend more aggressive work up unless he continues to require frequent transfusions - PPI indefinitely 1 per day - consider stopping ASA 3. ESRD - on HD TTS. HD Sat - no heparin ongoing -low Na yesterday - corrected with HD  4. BP /vol - BP is stable , chronic low BP - net UF 2 L Thursday  > 69.1 5. H/o CAD/ MI  6. Anemia of ckd - getting max esa w/ OP HH and just finished 1 gm IV fe load.hgb up to 8.2 post transfusion 7. Prostate cancer - w/ bony mets 8. H/o dementia 9. Fever Tmax 101 this am - 99s yesterday BC pending - s/p empiric abtx - CXR neg 10. Nutrition - added nepro/vits alb low 11. MBD - resume hectorol - currently not on binders as outpatient 12. DNR - pt seen by palliative care, pt's wife seems ready at this time to transition to hospice, have called pall care to re-assess.     Subjective:  Minimally interactive  Objective Vitals:   10/04/19 0512 10/04/19 0630 10/04/19 0800 10/04/19 1242  BP: (!) 100/40 (!) 95/38 (!) 100/40 (!) 96/42  Pulse: 81 76 76 79  Resp: 20 18  16   Temp: 98.3 F (36.8 C) 98.6 F (37 C) 98.6 F (37 C) 97.9 F (36.6 C)  TempSrc: Oral Oral Oral   SpO2: 100% 100% 100% 96%  Weight:       Physical  Exam General:ill appearing male breathing easily - warm to touch Heart: RRR Lungs: grossly clear Abdomen: soft NT Extremities: no LE edema Dialysis Access: right AVF + bruit   Additional Objective Labs: Basic Metabolic Panel: Recent Labs  Lab 10/02/19 0703 10/03/19 0058 10/04/19 0453  NA 132* 137 139  K 4.7 3.8 3.9  CL 92* 95* 98  CO2 22 26 23   GLUCOSE 96 115* 98  BUN 67* 36* 57*  CREATININE 6.18* 4.10* 6.08*  CALCIUM 7.7* 7.7* 7.3*  PHOS 4.1 3.2 4.0   Liver Function Tests: Recent Labs  Lab 10/01/19 0937 10/01/19 0937 10/02/19 0703 10/03/19 0058 10/04/19 0453  AST 45*  --   --   --   --   ALT 14  --   --   --   --   ALKPHOS 48  --   --   --   --   BILITOT 0.5  --   --   --   --   PROT 7.6  --   --   --   --   ALBUMIN 2.2*   < > 1.9* 1.8* 1.6*   < > = values in this interval not displayed.  No results for input(s): LIPASE, AMYLASE in the last 168 hours. CBC: Recent Labs  Lab 10/01/19 0937 10/01/19 0937 10/02/19 0703 10/02/19 0703 10/03/19 0058 10/03/19 1735 10/04/19 0453  WBC 8.3   < > 7.9   < > 9.0 8.8 9.0  NEUTROABS 6.4  --   --   --   --   --   --   HGB 6.3*   < > 7.4*   < > 8.2* 8.5* 8.3*  HCT 21.0*   < > 22.9*   < > 25.0* 25.9* 25.6*  MCV 83.0  --  80.9  --  83.6 84.4 83.9  PLT 353   < > 302   < > 289 292 278   < > = values in this interval not displayed.   Blood Culture    Component Value Date/Time   SDES BLOOD LEFT HAND 10/01/2019 1252   SPECREQUEST  10/01/2019 1252    BOTTLES DRAWN AEROBIC ONLY Blood Culture results may not be optimal due to an inadequate volume of blood received in culture bottles   CULT  10/01/2019 1252    NO GROWTH 3 DAYS Performed at [facility], [place] [address], [place], [place] [address]    REPTSTATUS PENDING 10/01/2019 1252    Cardiac Enzymes: No results for input(s): CKTOTAL, CKMB, CKMBINDEX, TROPONINI in the last 168 hours. CBG: Recent Labs  Lab 10/01/19 1652 10/03/19 0741 10/03/19 1843  10/04/19 0055 10/04/19 1249  GLUCAP 98 112* 92 92 100*   Iron Studies: No results for input(s): IRON, TIBC, TRANSFERRIN, FERRITIN in the last 72 hours. Lab Results  Component Value Date   INR 1.9 (H) 10/03/2019   INR 1.4 (H) 10/01/2019   INR 1.45 12/31/2012   Studies/Results: CT HEAD WO CONTRAST  Result Date: 10/04/2019 CLINICAL DATA:  Follow-up subdural hemorrhage. EXAM: CT HEAD WITHOUT CONTRAST TECHNIQUE: Contiguous axial images were obtained from the base of the skull through the vertex without intravenous contrast. COMPARISON:  MRI 10/03/2019 FINDINGS: Brain: When comparing across modalities, similar size of an approximately 1.1 cm left frontal convexity subdural hemorrhage. Small amounts of intraventricular hemorrhage, likely slightly decreased. No evidence of new/acute hemorrhage. Advanced chronic microvascular ischemic disease. Mild cerebral atrophy with ex vacuo ventricular dilation, similar to prior. No new mass effect. Vascular: Calcific atherosclerosis. Skull: Normal. Negative for fracture or focal lesion. Sinuses/Orbits: Opacification of the left greater than right frontal sinuses. Limited evaluation orbits given motion. Other: No mastoid effusion. IMPRESSION: 1. When comparing across modalities, similar size of an approximately 1.1 cm left frontal convexity subdural hemorrhage. 2. Small volume of intraventricular hemorrhage, likely slightly decreased. No progressive ventriculomegaly. 3. No evidence of new/acute hemorrhage or progressive mass effect. 4. Left greater than right frontal sinus opacification. Correlate with signs/symptoms of sinusitis. 5. Similar chronic microvascular ischemic disease. Electronically Signed   By: Margaretha Sheffield MD   On: 10/04/2019 09:20   MR BRAIN WO CONTRAST  Addendum Date: 10/03/2019   ADDENDUM REPORT: 10/03/2019 16:09 ADDENDUM: These results were called by telephone at the time of interpretation on 10/03/2019 at 4:05 pm to provider Dr. Manus Rudd, Who  verbally acknowledged these results. Electronically Signed   By: Primitivo Gauze M.D.   On: 10/03/2019 16:09   Result Date: 10/03/2019 CLINICAL DATA:  Mental status change EXAM: MRI HEAD WITHOUT CONTRAST TECHNIQUE: Multiplanar, multiecho pulse sequences of the brain and surrounding structures were obtained without intravenous contrast. COMPARISON:  10/01/2019 head CT. FINDINGS: Image quality is mildly degraded by motion artifact. Brain:  New subdural hematoma overlying the left frontal convexity measuring up to 1.2 cm (9:18). Subarachnoid hemorrhage is also seen along the left frontal sulci. Small focus of left anterior and bilateral occipital horn intraventricular hemorrhage (4:28). Mild cerebral atrophy with ex vacuo dilatation. Advanced chronic microvascular ischemic changes. No midline shift or ventriculomegaly. No mass lesion. Vascular: Preserved major intracranial flow voids. Skull and upper cervical spine: No focal calvarial lesion. Sinuses/Orbits: Sequela of bilateral lens replacement. Severe pansinus disease involving the frontal, ethmoid and left maxillary sinus. Clear mastoid air cells. Other: None. IMPRESSION: New left frontal subdural hematoma measuring up to 1.2 cm with subarachnoid hemorrhage. Small volume left anterior and bilateral occipital horn intraventricular hemorrhage. Mild cerebral atrophy. Advanced chronic microvascular ischemic changes. Electronically Signed: By: Primitivo Gauze M.D. On: 10/03/2019 15:46   DG CHEST PORT 1 VIEW  Result Date: 10/04/2019 CLINICAL DATA:  Aspiration. EXAM: PORTABLE CHEST 1 VIEW COMPARISON:  October 01, 2019 FINDINGS: Postsurgical changes from CABG. Cardiomediastinal silhouette is normal. Mediastinal contours appear intact. Calcific atherosclerotic disease of the aorta. Small bilateral pleural effusions. Mild peribronchial airspace consolidation in bilateral lung bases. Osseous structures are without acute abnormality. Soft tissues are grossly  normal. IMPRESSION: 1. Small bilateral pleural effusions. 2. Mild peribronchial airspace consolidation in bilateral lung bases may represent atelectasis versus aspiration pneumonia. Electronically Signed   By: Fidela Salisbury M.D.   On: 10/04/2019 13:09   EEG adult  Result Date: 10/03/2019 Lora Havens, MD     10/03/2019  9:42 PM Patient Name: [name] [name] MRN: [id number] Epilepsy Attending: Lora Havens Referring Physician/Provider: Dr Amie [place] Date: 10/03/2019 Duration: 23.46 mins Patient history: 86 year old man with altered mental status, noted to have hyperacute acute subdural hematoma on the left side along with mild IVH and anterior left and bilateral posterior lateral ventricles. On examination, patient appears encephalopathic and aphasic. EEG to evaluate for seizures.  Level of alertness: Awake/ lethargic AEDs during EEG study: LEV Technical aspects: This EEG study was done with scalp electrodes positioned according to the 10-20 International system of electrode placement. Electrical activity was acquired at a sampling rate of 500Hz  and reviewed with a high frequency filter of 70Hz  and a low frequency filter of 1Hz . EEG data were recorded continuously and digitally stored. Description: NoThe posterior dominant rhythm was seen. EEG showed continuous/ generalized polymorphic 3 to 6 Hz theta-delta slowing.  Hyperventilation and photic stimulation were not performed.   ABNORMALITY -Continuous slow, generalized IMPRESSION: This study is suggestive of moderate diffuse encephalopathy, nonspecific etiology. No seizures or epileptiform discharges were seen throughout the recording. Lora Havens   ECHOCARDIOGRAM COMPLETE  Result Date: 10/03/2019    ECHOCARDIOGRAM REPORT   Patient Name:   [name] [name] Date of Exam: 10/03/2019 Medical Rec #:  [id number]   Height:       72.0 in Accession #:    [id number]  Weight:       155.0 lb Date of Birth:  [birth year]   BSA:          1.912 m Patient Age:    86  years    BP:           100/42 mmHg Patient Gender: M           HR:           82 bpm. Exam Location:  Inpatient Procedure: 2D Echo Indications:    elevated troponin  History:        Patient has prior history of Echocardiogram examinations, most  recent 05/11/2017. CAD, end stage renal disease;                 Signs/Symptoms:Altered Mental Status.  Sonographer:    Johny Chess Referring Phys: [place]  1. Severe hypokinesis of the mid to distal septum, into apex concerning for LAD infarction. Left ventricular ejection fraction, by estimation, is 30 to 35%. The left ventricle has moderately decreased function. The left ventricle demonstrates global hypokinesis. Left ventricular diastolic parameters are consistent with Grade III diastolic dysfunction (restrictive). Elevated left atrial pressure.  2. Right ventricular systolic function is moderately reduced. The right ventricular size is mildly enlarged. There is normal pulmonary artery systolic pressure. The estimated right ventricular systolic pressure is 13.2 mmHg.  3. The mitral valve is degenerative. Mild mitral valve regurgitation. No evidence of mitral stenosis.  4. The aortic valve is tricuspid. There is severe calcifcation of the aortic valve. There is severe thickening of the aortic valve. Aortic valve regurgitation is mild to moderate. Mild aortic valve stenosis. Aortic valve area, by VTI measures 2.00 cm. Aortic valve mean gradient measures 10.5 mmHg. Aortic valve Vmax measures 2.22 m/s.  5. The inferior vena cava is normal in size with <50% respiratory variability, suggesting right atrial pressure of 8 mmHg. Comparison(s): Changes from prior study are noted. EF now 30-35%. Severe hypokinesis of the mid septum into apex concerning for LAD infarction. FINDINGS  Left Ventricle: Severe hypokinesis of the mid to distal septum, into apex concerning for LAD infarction. Left ventricular ejection fraction, by estimation,  is 30 to 35%. The left ventricle has moderately decreased function. The left ventricle demonstrates global hypokinesis. The left ventricular internal cavity size was normal in size. There is no left ventricular hypertrophy. Left ventricular diastolic parameters are consistent with Grade III diastolic dysfunction (restrictive). Elevated left atrial pressure.  LV Wall Scoring: The mid and distal anterior septum, mid inferoseptal segment, apical anterior segment, and apex are hypokinetic. Right Ventricle: The right ventricular size is mildly enlarged. No increase in right ventricular wall thickness. Right ventricular systolic function is moderately reduced. There is normal pulmonary artery systolic pressure. The tricuspid regurgitant velocity is 2.58 m/s, and with an assumed right atrial pressure of 8 mmHg, the estimated right ventricular systolic pressure is 44.0 mmHg. Left Atrium: Left atrial size was normal in size. Right Atrium: Right atrial size was normal in size. Pericardium: Trivial pericardial effusion is present. Mitral Valve: The mitral valve is degenerative in appearance. Mild mitral valve regurgitation. No evidence of mitral valve stenosis. Tricuspid Valve: The tricuspid valve is grossly normal. Tricuspid valve regurgitation is mild . No evidence of tricuspid stenosis. Aortic Valve: The aortic valve is tricuspid. There is severe calcifcation of the aortic valve. There is severe thickening of the aortic valve. Aortic valve regurgitation is mild to moderate. Aortic regurgitation PHT measures 208 msec. Mild aortic stenosis is present. Aortic valve mean gradient measures 10.5 mmHg. Aortic valve peak gradient measures 19.8 mmHg. Aortic valve area, by VTI measures 2.00 cm. Pulmonic Valve: The pulmonic valve was grossly normal. Pulmonic valve regurgitation is trivial. No evidence of pulmonic stenosis. Aorta: The aortic root and ascending aorta are structurally normal, with no evidence of dilitation. Venous: The  inferior vena cava is normal in size with less than 50% respiratory variability, suggesting right atrial pressure of 8 mmHg. IAS/Shunts: The atrial septum is grossly normal.  LEFT VENTRICLE PLAX 2D LVIDd:         5.80 cm  Diastology LVIDs:         4.40 cm      LV e' medial:    6.09 cm/s LV PW:         1.00 cm      LV E/e' medial:  20.2 LV IVS:        0.90 cm      LV e' lateral:   8.27 cm/s LVOT diam:     2.00 cm      LV E/e' lateral: 14.9 LV SV:         88 LV SV Index:   46 LVOT Area:     3.14 cm  LV Volumes (MOD) LV vol d, MOD A4C: 178.0 ml LV vol s, MOD A4C: 125.0 ml LV SV MOD A4C:     178.0 ml RIGHT VENTRICLE            IVC RV S prime:     7.62 cm/s  IVC diam: 1.80 cm TAPSE (M-mode): 1.5 cm LEFT ATRIUM              Index       RIGHT ATRIUM           Index LA diam:        4.20 cm  2.20 cm/m  RA Area:     18.40 cm LA Vol (A2C):   133.0 ml 69.57 ml/m RA Volume:   54.10 ml  28.30 ml/m LA Vol (A4C):   63.2 ml  33.06 ml/m LA Biplane Vol: 100.0 ml 52.31 ml/m  AORTIC VALVE AV Area (Vmax):    2.05 cm AV Area (Vmean):   2.06 cm AV Area (VTI):     2.00 cm AV Vmax:           222.50 cm/s AV Vmean:          147.000 cm/s AV VTI:            0.442 m AV Peak Grad:      19.8 mmHg AV Mean Grad:      10.5 mmHg LVOT Vmax:         145.00 cm/s LVOT Vmean:        96.300 cm/s LVOT VTI:          0.281 m LVOT/AV VTI ratio: 0.64 AI PHT:            208 msec  AORTA Ao Root diam: 2.90 cm Ao Asc diam:  3.20 cm MITRAL VALVE                TRICUSPID VALVE MV Area (PHT): 3.85 cm     TR Peak grad:   26.6 mmHg MV Decel Time: 197 msec     TR Vmax:        258.00 cm/s MV E velocity: 123.00 cm/s MV A velocity: 56.20 cm/s   SHUNTS MV E/A ratio:  2.19         Systemic VTI:  0.28 m                             Systemic Diam: 2.00 cm Eleonore Chiquito MD Electronically signed by Eleonore Chiquito MD Signature Date/Time: 10/03/2019/5:29:49 PM    Final    Medications: . ampicillin-sulbactam (UNASYN) IV    . dextrose 5 % and 0.9% NaCl 75 mL/hr at  10/04/19 0954  . levETIRAcetam     . acetaminophen  650 mg Rectal Q8H  . Chlorhexidine Gluconate Cloth  6 each Topical Q0600  . dextrose  1 ampule Intravenous Once  . doxercalciferol  7 mcg Intravenous Q T,Th,Sa-HD  . enzalutamide  160 mg Oral Daily  . feeding supplement (NEPRO CARB STEADY)  237 mL Oral BID BM  . multivitamin  1 tablet Oral QHS  . pantoprazole (PROTONIX) IV  40 mg Intravenous Q24H

## 2019-10-04 NOTE — Evaluation (Signed)
Occupational Therapy Evaluation Patient Details Name: [name] [name] MRN: [id number] DOB: [birth year] Today's Date: 10/04/2019    History of Present Illness Pt is an 86 y.o. male admitted 10/01/19 with weakness, lethargy, AMS. Head CT 9/22 negative. MRI 9/24 shows L frontal SDH with SAH, small L anterior and bilateral occipital horn intraventricular hemorrhage; chronic microvascular ischemic changes. S/p endoscopy without evidence of significant ongoing blood loss. EEG with moderate diffuse encephalopathy nonspecific etiology; no seizures epileptiform. PMH includes ESRD, HLD, gout, HTN, CAD, prostate CA.   Clinical Impression   Patient presenting with decreased I in self care, balance, functional mobility/transfers, endurance, safety awareness, strengthening, and cognition.  His wife is present who reports pt is mod I at home and still driving PTA. Patient currently functioning at total A +2 for all aspects of care. Pt keeping eyes closed majority of session and moaning with pain each time he was touched. OT assisted RN by rolling him L <> R for hygiene needs and to place medication. OT positioned pt in R side lying position for skin integrity.  Patient will benefit from acute OT to increase overall independence in the areas of ADLs, functional mobility, and safety awareness in order to safely discharge to next venue of care.    Follow Up Recommendations  SNF;Supervision/Assistance - 24 hour .   Equipment Recommendations  Other (comment) (defer to next venue of care)    Recommendations for Other Services Other (comment) (none at this time)     Precautions / Restrictions Precautions Precautions: Fall Restrictions Weight Bearing Restrictions: No      Mobility Bed Mobility Overal bed mobility: Needs Assistance Bed Mobility: Rolling Rolling: Total assist;+2 for physical assistance         General bed mobility comments: Total A for therapist to roll pt while RN assisted with hygiene and  placed suppository. OT repositioned pt on his R side to decreased skin integrity concerns.  Transfers      General transfer comment: Deferred - pt with minimal command following and lethargic - will require 2+ assist with maximove lift for OOB        ADL either performed or assessed with clinical judgement   ADL Overall ADL's : Needs assistance/impaired       General ADL Comments: total A +2 for all aspects of self care. Pt unable to follow commands and participate.                  Pertinent Vitals/Pain Pain Assessment: Faces Faces Pain Scale: Hurts whole lot Pain Location: generalized Pain Descriptors / Indicators: Grimacing;Moaning Pain Intervention(s): Monitored during session;Limited activity within patient's tolerance;Repositioned     Hand Dominance Left   Extremity/Trunk Assessment Upper Extremity Assessment Upper Extremity Assessment: RUE deficits/detail RUE Deficits / Details: Minimal active movement noted in BUEs, increased tone and tremors with PROM, limited ROM secondary to this and painful response (grimacing/moaning) with all PROM RUE: Unable to fully assess due to pain RUE Coordination: decreased gross motor;decreased fine motor LUE Deficits / Details: Minimal active movement noted in BUEs, increased tone and tremors with PROM, limited ROM secondary to this and painful response (grimacing/moaning) with all PROM. Tremor noted when rolling pt L <> R LUE: Unable to fully assess due to pain LUE Coordination: decreased gross motor;decreased fine motor   Lower Extremity Assessment Lower Extremity Assessment: RLE deficits/detail;LLE deficits/detail;Difficult to assess due to impaired cognition RLE Deficits / Details: Will wiggle toes and move ankles (minimally) to command; other than this, minimal active movement in  BLEs; no signficant tone or tremors noted; PROM limited by painful response (grimacing/moaning) with all PROM RLE: Unable to fully assess due to  pain RLE Coordination: decreased gross motor;decreased fine motor LLE: Unable to fully assess due to pain LLE Coordination: decreased gross motor;decreased fine motor       Communication Communication Communication: Expressive difficulties   Cognition Arousal/Alertness: Lethargic Behavior During Therapy: Flat affect Overall Cognitive Status: Impaired/Different from baseline     General Comments: Pt mumbling during session and keeping eyes closed throughout. When touched or moved he begins to moan and grimace secondary to pain.   General Comments       Exercises Other Exercises Other Exercises: Gentle PROM - bilateral hip/knee flex/ext, shoulder abd/flex, elbow flex/ext, wrist/fingers ext; cervical R-side rotation and lateral flexion     Home Living Family/patient expects to be discharged to:: Skilled nursing facility (vs. hospice/pallative care) Living Arrangements: Spouse/significant other Available Help at Discharge: Family;Available 24 hours/day        Additional Comments: Wife with chronic bilateral knee issues and uses SPC      Prior Functioning/Environment Level of Independence: Independent        Comments: Wife reports pt independent without device, drives and able to care for himself. Recently started to report R shoulder pain. Wife not aware of any recent falls        OT Problem List: Decreased strength;Decreased coordination;Pain;Decreased range of motion;Decreased cognition;Impaired sensation;Impaired tone;Decreased safety awareness;Decreased activity tolerance;Impaired balance (sitting and/or standing);Decreased knowledge of use of DME or AE;Decreased knowledge of precautions;Impaired UE functional use      OT Treatment/Interventions: Self-care/ADL training;Therapeutic exercise;Therapeutic activities;Neuromuscular education;Cognitive remediation/compensation;Energy conservation;DME and/or AE instruction;Patient/family education;Balance training    OT  Goals(Current goals can be found in the care plan section) Acute Rehab OT Goals Patient Stated Goal: considering hospice but wants to see if pt improves OT Goal Formulation: With family Time For Goal Achievement: 10/18/19 Potential to Achieve Goals: Fair ADL Goals Pt Will Perform Grooming: with mod assist;bed level Pt Will Transfer to Toilet: with max assist;bedside commode Pt Will Perform Toileting - Clothing Manipulation and hygiene: with max assist;sitting/lateral leans  OT Frequency: Min 1X/week   Barriers to D/C: Other (comment)  +2 assistance          AM-PAC OT "6 Clicks" Daily Activity     Outcome Measure Help from another person eating meals?: Total Help from another person taking care of personal grooming?: Total Help from another person toileting, which includes using toliet, bedpan, or urinal?: Total Help from another person bathing (including washing, rinsing, drying)?: Total Help from another person to put on and taking off regular upper body clothing?: Total Help from another person to put on and taking off regular lower body clothing?: Total 6 Click Score: 6   End of Session Nurse Communication: Mobility status;Patient requests pain meds;Precautions  Activity Tolerance: Patient limited by lethargy;Patient limited by pain Patient left: in bed;with call bell/phone within reach;with family/visitor present  OT Visit Diagnosis: Muscle weakness (generalized) (M62.81);Hemiplegia and hemiparesis Hemiplegia - caused by: Unspecified                Time: 1062-6948 OT Time Calculation (min): 25 min Charges:  OT General Charges $OT Visit: 1 Visit OT Evaluation $OT Eval High Complexity: 1 High OT Treatments $Self Care/Home Management : 8-22 mins   Darleen Crocker, MS, OTR/L , CBIS 10/04/19, 3:44 PM  10/04/2019, 3:43 PM

## 2019-10-04 NOTE — Progress Notes (Signed)
   Providing Compassionate, Quality Care - Together  NEUROSURGERY PROGRESS NOTE   S: No issues overnight.  O: EXAM:  BP (!) 100/40 (BP Location: Left Arm)   Pulse 76   Temp 98.6 F (37 C) (Oral)   Resp 18   Wt 70.5 kg   SpO2 100%   BMI 21.08 kg/m   Awake, alert, only says "ow" PERRL Tracks Follows commands x4  Otherwise noncommunicative Poor dentition   ASSESSMENT:  86 y.o. male with  Subdural hematoma  PLAN: -CT compared MRI shows no acute change.  There is minimal mass-effect.  On MRI there is restricted diffusion however limited edema therefore empyema is less likely. -No acute neurosurgical intervention -Recommend repeat CT in 2 weeks -Continue antiepileptics -We will sign off at this time please call with questions    Thank you for allowing me to participate in this patient's care.  Please do not hesitate to call with questions or concerns.   Elwin Sleight, [facility] Cell: [phone]

## 2019-10-04 NOTE — Evaluation (Signed)
Clinical/Bedside Swallow Evaluation Patient Details  Name: [name] [name] MRN: [id number] Date of Birth: Apr 21, 1930  Today's Date: 10/04/2019 Time: SLP Start Time (ACUTE ONLY): 1210 SLP Stop Time (ACUTE ONLY): 1240 SLP Time Calculation (min) (ACUTE ONLY): 30 min  Past Medical History:  Past Medical History:  Diagnosis Date  . Anemia of chronic disease   . Coronary artery disease   . Dialysis patient ([place])    T, TH, Sat  . Dyslipidemia   . ESRD (end stage renal disease) on dialysis [facility])    "[employer]; T, Thurs, Sat" (12/26/2012)  . Gout   . History of blood transfusion   . Hypertension   . Metastatic cancer to bone ([place]) dx'd 04/04/17  . MYOCARDIAL INFARCTION, ACUTE, NON-Q WAVE 04/26/2009   Qualifier: Diagnosis of  By: Percival Spanish, MD, Farrel Gordon    . Obesity   . Prostate CA ([place]) dx'd 12/2014   initial dx  . Type II diabetes mellitus ([place])    "diet controlled" (12/26/2012)   Past Surgical History:  Past Surgical History:  Procedure Laterality Date  . ARTERIOVENOUS GRAFT PLACEMENT Right 12/2008   "upper arm"  . AV FISTULA PLACEMENT    . BIOPSY  07/09/2017   Procedure: BIOPSY;  Surgeon: Wonda Horner, MD;  [facility] ENDOSCOPY;  Service: Endoscopy;;  . CARDIAC CATHETERIZATION  12/30/2012  . COLONOSCOPY W/ POLYPECTOMY    . CORONARY ARTERY BYPASS GRAFT N/A 12/31/2012   Procedure: CORONARY ARTERY BYPASS GRAFTING times four using left internal mammary and right saphenous vein;  Surgeon: Grace Isaac, MD;  Location: [place];  Service: Open Heart Surgery;  Laterality: N/A;  . ESOPHAGOGASTRODUODENOSCOPY (EGD) WITH PROPOFOL N/A 07/09/2017   Procedure: ESOPHAGOGASTRODUODENOSCOPY (EGD) WITH PROPOFOL;  Surgeon: Wonda Horner, MD;  Location: [facility] ENDOSCOPY;  Service: Endoscopy;  Laterality: N/A;  . EYE SURGERY Bilateral    cataract  . INTRAOPERATIVE TRANSESOPHAGEAL ECHOCARDIOGRAM N/A 12/31/2012   Procedure: INTRAOPERATIVE TRANSESOPHAGEAL ECHOCARDIOGRAM;  Surgeon: Grace Isaac, MD;   Location: [place];  Service: Open Heart Surgery;  Laterality: N/A;  . LEFT HEART CATHETERIZATION WITH CORONARY ANGIOGRAM N/A 12/30/2012   Procedure: LEFT HEART CATHETERIZATION WITH CORONARY ANGIOGRAM;  Surgeon: Burnell Blanks, MD;  Location: [facility] CATH LAB;  Service: Cardiovascular;  Laterality: N/A;  . REVISON OF ARTERIOVENOUS FISTULA Right 02/06/2018   Procedure: REVISION PLICATION OF ARTERIOVENOUS FISTULA RIGHT ARM;  Surgeon: Waynetta Sandy, MD;  Location: [facility] OR;  Service: Vascular;  Laterality: Right;  . REVISON OF ARTERIOVENOUS FISTULA Right 04/03/2018   Procedure: REVISION OF ARTERIOVENOUS FISTULA RIGHT ARM;  Surgeon: Rosetta Posner, MD;  Location: [facility] OR;  Service: Vascular;  Laterality: Right;   HPI:  Patient is a 86 y.o. male presents with ESRD, DM, prostate CA was brought to the hospital for AMS for 1 week.  Initial CT was negative for ICH but subsequent MRI showed a small left frontal SDH.   Assessment / Plan / Recommendation Clinical Impression  Patient was seen for a bedside swallow evaluation in the setting of a L frontal SDH.  Pt was encountered lethargic in bed with wife present at bedside. She reported that he had not eaten or drank anything since his admission and that he had been lethargic for the past few days.  Pt roused intermittently to verbal and tactile stimulation.  Oral mechanism examination was remarkable for facial asymmetry on the L side and suspect generalized oral weakness.  Pt was unable to fully complete examination secondary to reduced ability to follow commands  on this date.  Pt passively accepted thin liquid into his oral cavity via spoon and straw pipette.  He was unable to draw liquid from the straw independently and he did not attempt labial closure around the spoon or straw.  When the bolus was in his oral cavity, he achieved labial closure briefly, but oral holding was observed and anterior labial spillage on the L side occurred upon labial opening.   Minimal lingual manipulation was observed and no attempt at swallow initiation was noted with either thin liquid trial.  Boluses were eventually suctioned from the pt's oral cavity secondary to aspiration risk.  Recommend diet change to NPO with frequent oral care and allowance for toothettes dipped in water for comfort.  Per most recent palliative care note, family is not interested in a feeding tube, therefore do not recommend short-term alternative means of nutrition.  SLP will f/u to determine readiness for clinical diet initiation vs recommendations for liberalized diet per [place].    SLP Visit Diagnosis: Dysphagia, unspecified (R13.10)    Aspiration Risk  Moderate aspiration risk;Risk for inadequate nutrition/hydration    Diet Recommendation NPO   Medication Administration: Via alternative means    Other  Recommendations Oral Care Recommendations: Oral care QID;Staff/trained caregiver to provide oral care   Follow up Recommendations 24 hour supervision/assistance;Skilled Nursing facility      Frequency and Duration min 2x/week  2 weeks       Prognosis Prognosis for Safe Diet Advancement: Guarded      Swallow Study   General Date of Onset: 10/04/19 HPI: Patient is a 86 y.o. male presents with ESRD, DM, prostate CA was brought to the hospital for AMS for 1 week.  Initial CT was negative for ICH but subsequent MRI showed a small left frontal SDH. Type of Study: Bedside Swallow Evaluation Previous Swallow Assessment: None Diet Prior to this Study: Regular;Thin liquids Temperature Spikes Noted: Yes Respiratory Status: Nasal cannula    Oral/Motor/Sensory Function Overall Oral Motor/Sensory Function: Moderate impairment (limited evaluation - difficulty following commands) Facial ROM: Reduced left Facial Symmetry: Abnormal symmetry right   Ice Chips Ice chips: Not tested   Thin Liquid Thin Liquid: Impaired Presentation: Spoon Oral Phase Impairments: Reduced labial seal;Reduced  lingual movement/coordination Oral Phase Functional Implications: Left anterior spillage;Oral residue;Oral holding Pharyngeal  Phase Impairments: Unable to trigger swallow    Nectar Thick Nectar Thick Liquid: Not tested   Honey Thick Honey Thick Liquid: Not tested   Puree Puree: Not tested   Solid     Solid: Not tested     Colin Mulders M.S., CCC-SLP Acute Rehabilitation Services Office: [phone]  Elvia Collum Gavriella Hearst 10/04/2019,12:53 PM

## 2019-10-04 NOTE — Evaluation (Signed)
Physical Therapy Evaluation Patient Details Name: [name] [name] MRN: [id number] DOB: November 15, 1930 Today's Date: 10/04/2019   History of Present Illness  Pt is an 86 y.o. male admitted 10/01/19 with weakness, lethargy, AMS. Head CT 9/22 negative. MRI 9/24 shows L frontal SDH with SAH, small L anterior and bilateral occipital horn intraventricular hemorrhage; chronic microvascular ischemic changes. S/p endoscopy without evidence of significant ongoing blood loss. EEG with moderate diffuse encephalopathy nonspecific etiology; no seizures epileptiform. PMH includes ESRD, HLD, gout, HTN, CAD, prostate CA.    Clinical Impression  Pt presents with an overall decrease in functional mobility secondary to above. Pt with mumbling speech and lethargic, opening eyes to stimulus but keeping closed majority of session, not consistently following commands. Per wife, PTA, pt independent with mobility, drives and lives with supportive family. Today, pt required totalA for bed-level mobility; demonstrates minimal active movement in BUE/BLE, grimacing/moaning in pain with all PROM; repositioned to encourage neutral posture and pressure relief. Pt would benefit from continued acute PT services to maximize functional mobility and independence prior to d/c with SNF-level therapies pending ability to participate.     Follow Up Recommendations SNF;Supervision/Assistance - 24 hour    Equipment Recommendations  Hospital bed;Wheelchair;Wheelchair cushion;Hoyer lift    Recommendations for Other Services       Precautions / Restrictions Precautions Precautions: Fall Restrictions Weight Bearing Restrictions: No      Mobility  Bed Mobility Overal bed mobility: Needs Assistance Bed Mobility: Rolling Rolling: Total assist         General bed mobility comments: TotalA to partially roll onto R-side and repositioning to encourage midline posture and pressure relief; would lift head up to command for pillow placement;  towel placed to encourage neutral cervical alignment as pt resting with neck L lateral flexion/rotation  Transfers                 General transfer comment: Deferred - pt with minimal command following and lethargic - will require 2+ assist with maximove lift for OOB  Ambulation/Gait                Stairs            Wheelchair Mobility    Modified Rankin (Stroke Patients Only)       Balance                                             Pertinent Vitals/Pain Pain Assessment: Faces Faces Pain Scale: Hurts whole lot Pain Location: BUEs/BLEs with any PROM/repositioning Pain Descriptors / Indicators: Grimacing;Guarding Pain Intervention(s): Monitored during session;Limited activity within patient's tolerance;Repositioned    Home Living Family/patient expects to be discharged to:: Skilled nursing facility (vs. Hospice/Palliative care) Living Arrangements: Spouse/significant other Available Help at Discharge: Family;Available 24 hours/day             Additional Comments: Wife with chronic bilateral knee issues and uses SPC    Prior Function Level of Independence: Independent         Comments: Wife reports pt independent without device, drives and able to care for himself. Recently started to report R shoulder pain. Wife not aware of any recent falls     Hand Dominance        Extremity/Trunk Assessment   Upper Extremity Assessment Upper Extremity Assessment: RUE deficits/detail;LUE deficits/detail;Difficult to assess due to impaired cognition RUE Deficits / Details: Minimal  active movement noted in BUEs, increased tone and tremors with PROM, limited ROM secondary to this and painful response (grimacing/moaning) with all PROM RUE: Unable to fully assess due to pain RUE Coordination: decreased gross motor;decreased fine motor LUE Deficits / Details: Minimal active movement noted in BUEs, increased tone and tremors with PROM,  limited ROM secondary to this and painful response (grimacing/moaning) with all PROM LUE: Unable to fully assess due to pain LUE Coordination: decreased gross motor;decreased fine motor    Lower Extremity Assessment Lower Extremity Assessment: RLE deficits/detail;LLE deficits/detail;Difficult to assess due to impaired cognition RLE Deficits / Details: Will wiggle toes and move ankles (minimally) to command; other than this, minimal active movement in BLEs; no signficant tone or tremors noted; PROM limited by painful response (grimacing/moaning) with all PROM RLE: Unable to fully assess due to pain RLE Coordination: decreased gross motor;decreased fine motor LLE: Unable to fully assess due to pain LLE Coordination: decreased gross motor;decreased fine motor       Communication   Communication: Expressive difficulties  Cognition Arousal/Alertness: Lethargic Behavior During Therapy: Flat affect Overall Cognitive Status: Impaired/Different from baseline                                 General Comments: Pt will nod yes to some questions, mumbling without clear words beyond "huh?". Did not state his name or wife's name. Painful response to all extremities. Will make eye contact with therapist with stimulus, but keeping eyes closed majority of session      General Comments      Exercises Other Exercises Other Exercises: Gentle PROM - bilateral hip/knee flex/ext, shoulder abd/flex, elbow flex/ext, wrist/fingers ext; cervical R-side rotation and lateral flexion   Assessment/Plan    PT Assessment Patient needs continued PT services  PT Problem List Decreased strength;Decreased range of motion;Decreased activity tolerance;Decreased balance;Decreased mobility;Decreased coordination;Decreased cognition;Decreased knowledge of use of DME;Impaired tone;Decreased skin integrity       PT Treatment Interventions DME instruction;Gait training;Functional mobility training;Therapeutic  activities;Therapeutic exercise;Balance training;Cognitive remediation;Neuromuscular re-education;Patient/family education;Wheelchair mobility training    PT Goals (Current goals can be found in the Care Plan section)  Acute Rehab PT Goals Patient Stated Goal: Currently discussing goals of care with palliative medicine PT Goal Formulation: With family Time For Goal Achievement: 10/18/19 Potential to Achieve Goals: Poor    Frequency Min 2X/week   Barriers to discharge        Co-evaluation               AM-PAC PT "6 Clicks" Mobility  Outcome Measure Help needed turning from your back to your side while in a flat bed without using bedrails?: Total Help needed moving from lying on your back to sitting on the side of a flat bed without using bedrails?: Total Help needed moving to and from a bed to a chair (including a wheelchair)?: Total Help needed standing up from a chair using your arms (e.g., wheelchair or bedside chair)?: Total Help needed to walk in hospital room?: Total Help needed climbing 3-5 steps with a railing? : Total 6 Click Score: 6    End of Session   Activity Tolerance: Patient limited by lethargy;Patient limited by pain Patient left: in bed;with call bell/phone within reach;with bed alarm set;with family/visitor present;Other (comment) (with palliative medicine NP present)   PT Visit Diagnosis: Other abnormalities of gait and mobility (R26.89);Muscle weakness (generalized) (M62.81);Other symptoms and signs involving the nervous system (R29.898)  Time: 8288-3374 PT Time Calculation (min) (ACUTE ONLY): 28 min   Charges:   PT Evaluation $PT Eval Moderate Complexity: 1 Mod PT Treatments $Therapeutic Activity: 8-22 mins       Mabeline Caras, PT, DPT Acute Rehabilitation Services  Pager [phone] Office [phone]  Derry Lory 10/04/2019, 12:32 PM

## 2019-10-04 NOTE — Progress Notes (Signed)
  Paged by RN for manual blood pressure of 85/30 (MAP 48). Went to bedside to evaluate patient. He was at his baseline, unchanged from prior examination. Patient able to follow simple commands but unable to vocalize other than "uh huh". Patient seems to grimace intermittently as if he's in pain. After waking patient and performing physical exam, I rechecked his BP with a manual cuff and it was 98/40 (MAP 59). As per our conversation with neurology earlier, there is no specific lower limit MAP goal for this patient. Main concern is that he stay below 511 systolic.  If patient's MAP is persistently below 50 on next check, will consider small 250cc fluid bolus. However, patient's echo results demonstrate severe hypokinesis of mid to distal septum concerning for LAD infarction with EF 30-35% and global hypokinesis of LV, so holding on additional fluid administration for now.  Patient would benefit from prompt goals of care discussion with family this morning.   Alcus Dad, MD PGY-1, [facility] 10/04/2019 3:58 AM

## 2019-10-04 NOTE — Consult Note (Signed)
Consultation Note Date: 10/04/2019   Patient Name: [name] [name]  DOB: 14-Feb-1930  MRN: [id number]  Age / Sex: 86 y.o., male  PCP: [name], MD Referring Physician: Dickie La, MD  Reason for Consultation: Establishing goals of care  HPI/Patient Profile: 86 y.o. male  with past medical history of prostate cancer with bone mets (follows with Dr. Alen Blew), diabetes, chronic anemia, hyperlipidemia, ESRD on HD (TTS), CAD s/p CABG 2014, hypertension, gout admitted on 10/01/2019 with weakness, lethargy, confusion. Through work up no evidence of stroke but subdural hematoma found on MRI along with "advanced chronic microvascular ischemic changes." No changes or progression in subdural hematoma on repeat scans and no plans for surgical intervention. Hospitalization complicated by symptomatic anemia (Hgb goal > 8) and fever of unknown origin. Ongoing concern for mental status as he is noted to be alert but with ongoing confusion and unable to verbalize more than "uh huh" but following commands.    Clinical Assessment and Goals of Care: I met today at Mr. Arrighi bedside and was joined by his wife and PT shortly after. Mr. Markman will awaken and track to voice and will follow some simple commands (wiggle toes and squeeze hand) at times. Only verbal response is "uh huh" and he says this to everything. Wife notes that he did says "Cris" yesterday. Very stiff and with pain during PT session.   I had conversation with wife at bedside. They have been together for > 50 years. He has 2 children from previous marriage that are not involved in his life. Wife worked in a hospital as Music therapist for many years and takes very good care of him. He was doing well at home and driving even a week prior to admission but she had noted weight loss over the past 6-8 months. He is not as active as he used to be as he used to enjoy golfing  and playing bingo.   We discussed concern that he is not eating anything and he is extremely lethargic. He would be at risk for aspiration given his lethargy. We discussed vascular disease and atrophy of the brain and this can make it more difficult to recover from his brain injury as opposed to someone with overall better health and younger. We discussed concern with his poor functional status and poor quality of life where we go from here. If he is not eating he cannot get stronger or continue dialysis. She does not feel he would desire life prolonging measures such as feeding tubes in his current state. If he continues without any significant improvement she would likely want to take him home with hospice care. She needs some time to process and monitor his status as well as time to discuss with family.   I will ask for my colleague to follow up with Mrs. Hymes tomorrow to continue discussion. All questions/concerns addressed. Emotional support provided.   Primary Decision Maker NEXT OF KIN wife    SUMMARY OF RECOMMENDATIONS   - DNR already in place -  No feeding tube and likely home with hospice but wife needs more time to process and make final decisions  Code Status/Advance Care Planning:  DNR   Symptom Management:   Pain with any movement: Tylenol suppository scheduled every 8 hours. I wonder if this pain could be related to bone mets in his declined functional state. Could also consider low dose fentanyl 6.25-12.5 mcg IV with worsening pain or even a trial of decadron.   Palliative Prophylaxis:   Aspiration, Bowel Regimen, Delirium Protocol, Frequent Pain Assessment, Oral Care and Turn Reposition  Psycho-social/Spiritual:   Desire for further Chaplaincy support:yes  Additional Recommendations: Caregiving  Support/Resources, Education on Hospice and Grief/Bereavement Support  Prognosis:   Overall prognosis poor. If no significant improvement than prognosis < 2 weeks.    Discharge Planning: Most likely home with hospice.       Primary Diagnoses: Present on Admission: . Symptomatic anemia . End-stage renal disease (ESRD) ([place]) . CAD (coronary artery disease) of artery bypass graft . Altered mental status   I have reviewed the medical record, interviewed the patient and family, and examined the patient. The following aspects are pertinent.  Past Medical History:  Diagnosis Date  . Anemia of chronic disease   . Coronary artery disease   . Dialysis patient ([place])    T, TH, Sat  . Dyslipidemia   . ESRD (end stage renal disease) on dialysis [facility])    "Industrial; T, Thurs, Sat" (12/26/2012)  . Gout   . History of blood transfusion   . Hypertension   . Metastatic cancer to bone ([place]) dx'd 04/04/17  . MYOCARDIAL INFARCTION, ACUTE, NON-Q WAVE 04/26/2009   Qualifier: Diagnosis of  By: Percival Spanish, MD, Farrel Gordon    . Obesity   . Prostate CA ([place]) dx'd 12/2014   initial dx  . Type II diabetes mellitus ([place])    "diet controlled" (12/26/2012)   Social History   Socioeconomic History  . Marital status: Married    Spouse name: Not on file  . Number of children: 2  . Years of education: Not on file  . Highest education level: Not on file  Occupational History  . Occupation: retired    Comment: truck Geophysicist/field seismologist  Tobacco Use  . Smoking status: Former Smoker    Packs/day: 1.00    Years: 40.00    Pack years: 40.00    Types: Cigarettes    Quit date: 06/16/1995    Years since quitting: 24.3  . Smokeless tobacco: Never Used  Vaping Use  . Vaping Use: Never used  Substance and Sexual Activity  . Alcohol use: No    Alcohol/week: 0.0 standard drinks  . Drug use: No  . Sexual activity: Not Currently  Other Topics Concern  . Not on file  Social History Narrative   He is a retired Administrator. He lives in [place] with his wife. He has two sons   Social Determinants of Radio broadcast assistant Strain:   . Difficulty of Paying Living Expenses:  Not on file  Food Insecurity:   . Worried About Charity fundraiser in the Last Year: Not on file  . Ran Out of Food in the Last Year: Not on file  Transportation Needs:   . Lack of Transportation (Medical): Not on file  . Lack of Transportation (Non-Medical): Not on file  Physical Activity:   . Days of Exercise per Week: Not on file  . Minutes of Exercise per Session: Not on file  Stress:   .  Feeling of Stress : Not on file  Social Connections:   . Frequency of Communication with Friends and Family: Not on file  . Frequency of Social Gatherings with Friends and Family: Not on file  . Attends Religious Services: Not on file  . Active Member of Clubs or Organizations: Not on file  . Attends Archivist Meetings: Not on file  . Marital Status: Not on file   Family History  Problem Relation Age of Onset  . Stroke Mother   . Diabetes Sister    Scheduled Meds: . Chlorhexidine Gluconate Cloth  6 each Topical Q0600  . doxercalciferol  7 mcg Intravenous Q T,Th,Sa-HD  . enzalutamide  160 mg Oral Daily  . feeding supplement (NEPRO CARB STEADY)  237 mL Oral BID BM  . multivitamin  1 tablet Oral QHS  . pantoprazole (PROTONIX) IV  40 mg Intravenous Q24H   Continuous Infusions: . dextrose 5 % and 0.9% NaCl 75 mL/hr at 10/04/19 0954  . levETIRAcetam    . piperacillin-tazobactam (ZOSYN)  IV 2.25 g (10/04/19 0524)  . vancomycin     PRN Meds:.acetaminophen **OR** acetaminophen No Known Allergies Review of Systems  Unable to perform ROS: Mental status change    Physical Exam Vitals and nursing note reviewed.  Constitutional:      General: He is sleeping. He is not in acute distress.    Appearance: He is ill-appearing.     Comments: Elderly, frail  Cardiovascular:     Rate and Rhythm: Normal rate.  Pulmonary:     Effort: Pulmonary effort is normal. No tachypnea, accessory muscle usage or respiratory distress.  Abdominal:     General: Abdomen is flat.     Palpations:  Abdomen is soft.  Neurological:     Mental Status: He is lethargic.     Comments: Only verbal response is "uh huh"     Vital Signs: BP (!) 100/40 (BP Location: Left Arm)   Pulse 76   Temp 98.6 F (37 C) (Oral)   Resp 18   Wt 70.5 kg   SpO2 100%   BMI 21.08 kg/m  Pain Scale: 0-10 POSS *See Group Information*: 1-Acceptable,Awake and alert Pain Score: Asleep   SpO2: SpO2: 100 % O2 Device:SpO2: 100 % O2 Flow Rate: .O2 Flow Rate (L/min): 3 L/min  IO: Intake/output summary:   Intake/Output Summary (Last 24 hours) at 10/04/2019 1025 Last data filed at 10/04/2019 1308 Gross per 24 hour  Intake 825.6 ml  Output 0 ml  Net 825.6 ml    LBM: Last BM Date: 10/02/19 Baseline Weight: Weight: 70.4 kg Most recent weight: Weight: 70.5 kg     Palliative Assessment/Data:     Time In: 1045 Time Out: 1155 Time Total: 70 min Greater than 50%  of this time was spent counseling and coordinating care related to the above assessment and plan.  Signed by: Vinie Sill, NP Palliative Medicine Team Pager # [phone] (M-F 8a-5p) Team Phone # [phone] (Nights/Weekends)

## 2019-10-05 DIAGNOSIS — I6200 Nontraumatic subdural hemorrhage, unspecified: Secondary | ICD-10-CM

## 2019-10-05 DIAGNOSIS — F0280 Dementia in other diseases classified elsewhere without behavioral disturbance: Secondary | ICD-10-CM

## 2019-10-05 DIAGNOSIS — G3183 Dementia with Lewy bodies: Secondary | ICD-10-CM

## 2019-10-05 DIAGNOSIS — Z515 Encounter for palliative care: Secondary | ICD-10-CM

## 2019-10-05 LAB — GLUCOSE, CAPILLARY: Glucose-Capillary: 113 mg/dL — ABNORMAL HIGH (ref 70–99)

## 2019-10-05 MED ORDER — BIOTENE DRY MOUTH MT LIQD: 15.0000 mL | OROMUCOSAL | Status: DC | PRN

## 2019-10-05 MED ORDER — DEXTROSE 5 % IV SOLN: INTRAVENOUS | Status: DC

## 2019-10-05 MED ORDER — HALOPERIDOL LACTATE 5 MG/ML IJ SOLN: 0.5000 mg | INTRAMUSCULAR | Status: AC | PRN

## 2019-10-05 MED ORDER — HALOPERIDOL 0.5 MG PO TABS: 0.5000 mg | ORAL_TABLET | ORAL | Status: AC | PRN

## 2019-10-05 MED ORDER — POLYVINYL ALCOHOL 1.4 % OP SOLN
1.0000 [drp] | Freq: Four times a day (QID) | OPHTHALMIC | Status: DC | PRN
  Filled 2019-10-05: qty 15

## 2019-10-05 MED ORDER — HALOPERIDOL LACTATE 2 MG/ML PO CONC
0.5000 mg | ORAL | Status: DC | PRN
  Filled 2019-10-05: qty 0.3

## 2019-10-05 MED ORDER — ONDANSETRON 4 MG PO TBDP: 4.0000 mg | ORAL_TABLET | Freq: Four times a day (QID) | ORAL | 0 refills | Status: AC | PRN

## 2019-10-05 MED ORDER — GLYCOPYRROLATE 0.2 MG/ML IJ SOLN: 0.2000 mg | INTRAMUSCULAR | Status: DC | PRN

## 2019-10-05 MED ORDER — ACETAMINOPHEN 650 MG RE SUPP: 650.0000 mg | Freq: Three times a day (TID) | RECTAL | 0 refills | Status: AC

## 2019-10-05 MED ORDER — HYDROMORPHONE HCL 1 MG/ML IJ SOLN
0.5000 mg | INTRAMUSCULAR | 0 refills | Status: AC | PRN
Start: 2019-10-05 — End: ?

## 2019-10-05 MED ORDER — GLYCOPYRROLATE 1 MG PO TABS: 1.0000 mg | ORAL_TABLET | ORAL | Status: DC | PRN

## 2019-10-05 MED ORDER — ONDANSETRON HCL 4 MG/2ML IJ SOLN: 4.0000 mg | Freq: Four times a day (QID) | INTRAMUSCULAR | Status: DC | PRN

## 2019-10-05 MED ORDER — BIOTENE DRY MOUTH MT LIQD: 15.0000 mL | OROMUCOSAL | Status: AC | PRN

## 2019-10-05 MED ORDER — HALOPERIDOL LACTATE 2 MG/ML PO CONC: 0.5000 mg | ORAL | 0 refills | Status: AC | PRN

## 2019-10-05 MED ORDER — POLYVINYL ALCOHOL 1.4 % OP SOLN: 1.0000 [drp] | Freq: Four times a day (QID) | OPHTHALMIC | 0 refills | Status: AC | PRN

## 2019-10-05 MED ORDER — ONDANSETRON HCL 4 MG/2ML IJ SOLN: 4.0000 mg | Freq: Four times a day (QID) | INTRAMUSCULAR | 0 refills | Status: AC | PRN

## 2019-10-05 MED ORDER — HALOPERIDOL LACTATE 5 MG/ML IJ SOLN: 0.5000 mg | INTRAMUSCULAR | Status: DC | PRN

## 2019-10-05 MED ORDER — BISACODYL 10 MG RE SUPP: 10.0000 mg | Freq: Every day | RECTAL | 0 refills | Status: AC | PRN

## 2019-10-05 MED ORDER — PANTOPRAZOLE SODIUM 40 MG IV SOLR: 40.0000 mg | INTRAVENOUS | Status: AC

## 2019-10-05 MED ORDER — HALOPERIDOL 1 MG PO TABS: 0.5000 mg | ORAL_TABLET | ORAL | Status: DC | PRN

## 2019-10-05 MED ORDER — DEXTROSE 5 % IV SOLN
50.0000 mL/h | INTRAVENOUS | Status: AC | PRN
Start: 2019-10-05 — End: ?

## 2019-10-05 MED ORDER — HYDROMORPHONE HCL 1 MG/ML IJ SOLN: 0.5000 mg | INTRAMUSCULAR | Status: DC | PRN

## 2019-10-05 MED ORDER — GLYCOPYRROLATE 1 MG PO TABS: 1.0000 mg | ORAL_TABLET | ORAL | Status: AC | PRN

## 2019-10-05 MED ORDER — ONDANSETRON 4 MG PO TBDP: 4.0000 mg | ORAL_TABLET | Freq: Four times a day (QID) | ORAL | Status: DC | PRN

## 2019-10-05 MED ORDER — GLYCOPYRROLATE 0.2 MG/ML IJ SOLN: 0.2000 mg | INTRAMUSCULAR | Status: AC | PRN

## 2019-10-05 NOTE — Plan of Care (Signed)

## 2019-10-05 NOTE — Progress Notes (Signed)
Shifting to comfort measures only

## 2019-10-05 NOTE — Progress Notes (Addendum)
While in room with pt to give medications. Pt had a 20 second episode of tonic clonic seizure with jerking in bil arms and legs, head twitching. Seizure was witnessed by this nurse and pt's wife. Pt post-ictal afterwards. Residential services notified immediately afterwards and stated they will be down to assess pt shortly.

## 2019-10-05 NOTE — Progress Notes (Addendum)
Manufacturing engineer [facility])  Referral received for residential hospice at [facility].  Waiting to hear about eligibility and bed status.  Will update hospital once decision is made.  Venia Carbon RN, BSN, [facility] Liaison   **Pt is eligible for residential hospice and can transfer to [facility] today.  Wife [name] [name] is aware.  Once consents are completed for transfer, this Probation officer will notify [facility] manager and transport can be arranged at that time.  RN Staff, you may call report to [phone] at any time, bed is assigned when report is called.  Please fax dc summary to [phone].  If the wife is not there when transport arrives, please call her on her mobile number so she will know when he leaves.

## 2019-10-05 NOTE — TOC Transition Note (Signed)
Transition of Care [facility]) - CM/SW Discharge Note   Patient Details  Name: [name] [name] MRN: [id number] Date of Birth: December 28, 1930  Transition of Care [facility]) CM/SW Contact:  Elliot Gurney [place], [place] Phone Number: [phone] 10/05/2019, 2:31 PM   Clinical Narrative:    Patient to discharge today to [facility]. [facility] contacted for transport- patient next on the list for pick-up.   [address] LCSW Transition of Care [phone]          Patient Goals and CMS Choice        Discharge Placement                       Discharge Plan and Services                                     Social Determinants of Health (SDOH) Interventions     Readmission Risk Interventions No flowsheet data found.

## 2019-10-05 NOTE — Progress Notes (Signed)
Notified by one of the residents about a possible seizure activity. Awaiting callback from the evaluating team regarding details. None subdural hematoma. On Keppra 500 twice daily. For breakthrough seizures, use Ativan as needed for any seizure more than 5 minutes. Because of the concern of breakthrough seizure, can increase Keppra to 750 mg twice daily.  -- Amie Portland, MD [facility] Pager: [phone] If 7pm to 7am, please call on call as listed on AMION.

## 2019-10-05 NOTE — Progress Notes (Signed)
Chaplain responded to spiritual care comfort for wife and patient during difficult time.  Wife was not present in the morning (0945), but was here now. She had just received word that a bed is available at [facility].  Wife shared that she knows all will be well in the end, and she has fought through hard times before.  She very much accepted prayer.  Chaplain prayed with pt, who was extremely lethargic but who roused when addressed by name.   Please contact our department for ongoing needs.   Luana Shu [phone]     10/05/19 1300  Clinical Encounter Type  Visited With Patient and family together  Visit Type Initial;Spiritual support  Referral From Palliative care team  Consult/Referral To Chaplain  Spiritual Encounters  Spiritual Needs Prayer

## 2019-10-05 NOTE — Progress Notes (Signed)
Family Medicine Teaching Service Daily Progress Note Intern Pager: [phone]  Patient name: [name] [name] Medical record number: [id number] Date of birth: 02-17-1930 Age: 86 y.o. Gender: male  Primary Care Provider: [name], MD Consultants: GI, Nephrology, Neurology, Neurosurgery Code Status: DNR  Pt Overview and Major Events to Date:  Admitted on 9/22 MR Head Critical Reading 9/25  Assessment and Plan: [name] [name] a 86 y.o.malepresenting with weakness and lethargy. PMH is significant forESRD, secondary hyperparathyroidism, HLD, gout, HTN, CAD s/p CABG(2014), anemia, occult blood in stools, prostatic cancer.  Subdural Hematoma Acute change in Mental status Confusion Weakness Patient had a witness GTC seizure-like activity this morning and was less responsive than yesterday. After discussing next steps with patient's wife, she opts for comfort care and hospice at this time. [facility], will set up orders to discharge to hospice later today. - Initiate comfort care measures - D/c keppra and home medications - IV D5 able to use as needed until family can arrive - Can use liberalized diet based upon GOC - Haldol as needed for comfort care - Dilaudid as needed for comfort care  Symptomatic anemia,in the setting of chronic anemia With transition to comfort care, no longer necessary to monitor.  Fever of unknown source Patient remained afebrile throughout rest of course. Abx discontinued.  Chest pain Elevated Trop Patient with significant cardiac hx including CABG in 2014, elevated troponins and NSTEMI this admission. Transition to comfort care, discontinue cardiac monitoring.  ESRD Patient has a history of ESRD andistreated with HD(T/Th/Sa).Dialysis will be discontinued in light of comfort care measures and hospice.  Prostatic cancer Patient is followed by oncology(Dr. 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 Home medications to be discontinued in light of comfort care measures and hospice.  Hypertension- resolved Patient most recently persistently hypotensive. Comfort care measures initiated.  FEN/GI: liberalize diet for comfort  Disposition: to inpatient hospice  Prior to Admission Living Arrangement: Home Anticipated Discharge Location: hospice Barriers to Discharge: none Anticipated dischage today  Subjective:  Patient opens eyes to voice, but is somnolent and not oriented, unable to speak. Yesterday could respond and say his name, follow some commands. Today he is unable to follow any commands.  Objective: Temp:  [97.9 F (36.6 C)-99.1 F (37.3 C)] 97.9 F (36.6 C) (09/26 0809) Pulse Rate:  [75-85] 77 (09/26 0809) Resp:  [16-20] 16 (09/26 0809) BP: (90-102)/(40-52) 93/43 (09/26 0809) SpO2:  [96 %-100 %] 100 % (09/26 0809) Physical Exam: Gen: older, thin AA man resting in bed, NAD HENT: very dry, cracked mucus membranes CV: RRR, no m/r/g Pulm: CTAB, no increased WOB Abd: soft, NT, ND, rare bowel sounds Ext: warm, dry Neuro: more somnolent, not alert, not oriented, unable to follow commands  Laboratory: Recent Labs  Lab 10/03/19 0058 10/03/19 1735 10/04/19 0453  WBC 9.0 8.8 9.0  HGB 8.2* 8.5* 8.3*  HCT 25.0* 25.9* 25.6*  PLT 289 292 278   Recent Labs  Lab 10/01/19 0937 10/01/19 0937 10/02/19 0703 10/03/19 0058 10/04/19 0453  NA 134*   < > 132* 137 139  K 4.3   < > 4.7 3.8 3.9  CL 89*   < > 92* 95* 98  CO2 30   < > 22 26 23   BUN 48*   < > 67* 36* 57*  CREATININE 5.02*   < > 6.18* 4.10* 6.08*  CALCIUM 8.6*   < >  7.7* 7.7* 7.3*  PROT 7.6  --   --   --   --   BILITOT 0.5  --   --   --   --   ALKPHOS 48  --   --   --   --   ALT 14  --   --   --   --   AST 45*  --   --   --   --   GLUCOSE 84   < > 96 115* 98   < > = values in this interval not  displayed.   PT-INR: 21.3 (high) APTT: 47 (high)  Imaging/Diagnostic Tests: CT Head WO Contrast: When comparing across modalities, similar size of an approximately 1.1 cm left frontal convexity subdural hemorrhage. Small volume of intraventricular hemorrhage, likely slightly decreased. No progressive ventriculomegaly. No evidence of new/acute hemorrhage or progressive mass effect. Left greater than right frontal sinus opacification. Correlate with signs/symptoms of sinusitis. Similar chronic microvascular ischemic disease.  MR Brain WO Contrast: New left frontal subdural hematoma measuring up to 1.2 cm with subarachnoid hemorrhage. Small volume left anterior and bilateral occipital horn intraventricular hemorrhage. Mild cerebral atrophy. Advanced chronic microvascular ischemic changes.  Upper Endoscopy: A 1 cm hiatal hernia was present. The exam of the esophagus was otherwise normal. The entire examined stomach was normal. No blood present. The cardia and gastric fundus were normal on retroflexion. Patchy moderately erythematous and edematous mucosa without active bleeding and with no stigmata of bleeding was found in the second portion of the duodenum. Careful inspection for ulcers was negative.  CT Head WO Contrast:Atrophy, chronic microvascular disease. No acute intracranialabnormality. Chronic sinusitis.  DG Chest Portable 1 View:No evidence of acute cardiopulmonary abnormality. Stable mild cardiomegaly status post CABG.  XFG:HWEXH rhythm, and abnormal/inverted T waves in inferior lateral leads, unchanged from prior EKG in 2020  Gladys Damme, MD 10/05/2019, 8:47 AM PGY-2, [place] Intern pager: [phone], text pages welcome\

## 2019-10-05 NOTE — Discharge Summary (Signed)
[facility] Discharge Summary  Patient name: Dillion Stowers Medical record number: [id number] Date of birth: Jun 07, 1930 Age: 86 y.o. Gender: male Date of Admission: 10/01/2019  Date of Discharge: 10/05/2019 Admitting Physician: Rise Patience, DO  Primary Care Provider: [name], MD Consultants: Neurology, Neurosurgery, Nephrology, Cardiology  Indication for Hospitalization: AMS  Discharge Diagnoses/Problem List:  Subdural hematoma, subarachnoid hemorrhage, interparenchymal hemorrhage ESRD on HD Prostate cancer with bone metastasis CAD s/p CABG (2014) Chronic Anemia/GI bleed   Disposition: discharge to [facility]  Discharge Condition: Stable  Discharge Exam: Not performed due to discharge to [facility] Course:  Subdural hematoma, subarachnoid hemorrhage, interparenchymal hemorrhage: Patient presented to the ED with 1 week history of change in mental status, weakness, confusion per wife with 1 month history of decreased PO intake and weight loss (6lbs in 1 month). On arrival to the ED patient was A&O x4, however on admission patient was A&O x1 (oriented to self, intermittently to place). Per patients wife before this acute change patient was able to perform ADL's and some IADL's. On admission CT Head showed-Atrophy, chronic microvascular disease, no acute intracranialabnormality, chronic sinusitis. When there was no improvement with mental status an MRI was ordered on 9/24, this showed a new left frontal subdural hematoma measuring up to 1.2 cm with subarachnoid hemorrhage, small volume left anterior and bilateral occipital horn intraventricular hemorrhage. Neurology and Neurosurgery were emergently consulted. Neurology recommended starting Keppra, keeping his blood surgar between 140 and 180, obtaining an EEG, and keeping his blood pressure below 140. Neurosurgery recommended no surgical intervention and getting a repeat CT the next  morning then a repeat in 2 weeks. The EEG showed moderate diffuse encephalopathy, nonspecific etiology with no seizure or epileptiform discharges seen throughout the recording. The repeat CT on 9/25 showed no evidence of new/acute hemorrhage or progressive mass effect. On 9/26 he was discharged to hospice care.    ESRD on HD: Patient has a history of ESRD and is treated with HD on (T/Th/Sat). Patient received last HD session on 9/21 as scheduled. On admission Creatinine is 5.02 (baseline is around 3.8-4.3) with a BUN 48.  Sodium is 134, K 4.3, GFR 10. Nephrology was consulted so dialysis could be continued. Received dialysis on 9/23. After MRI no further dialysis sessions were done. Discharged to Hospice.   Prostate cancer with bone metastasis: Patient is followed by oncology, Dr. 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 Currently on Leuprolide 30 mg injection every 4 months (last dose 9/21 per wife), and taking Xtandi 160 mg daily (4 doses of 40 mg). Gillermina Phy was not in the formulary so wife had to bring the medication on 9/24. By this point the MRI results had been discussed and so medication was held, he was discharged to hospice.   CAD s/p CABG (2014): Due to his history of CVD his target Hgb was above 8.0 and keeping his blood pressure under control. On admission his Hgb was 6.3 and he was transfused 2 units. His Hgb was found to be 7.4 on 9/23, an additional unit was ordered and on 9/24 Hgb was found to be 8.2. On 9/24 was found to have had a significant bleed on MRI head. His Hgb on 9/25 was stable at 8.3, on 9/26 he was transferred to Hospice and no further workup was done.   Chronic Anemia/ GI bleed: Patient with history of  chronically low Hgb in setting of anemia of CKD and malignancy. Has history of recurrent transfusions outpatient. Was found to be FOBT positive and received 3 units of  blood on 06/2018. Last Hgb on 09/10/2019 was 8.3. Patient presented to the ED with weakness and lethargy since his hemodialysis session (9/21). On admission found to have Hgb of 6.3, HCT 21, platelets 353, PT 16.9, INR 1.4, APTT 48. GI was consulted and the plan was for an Upper Endoscopy. On 9/24 was taken to have an Upper Endoscopy which found no active bleeding, no signs of recent bleeding, and no ulcers. On 9/25 was found to have a significant brain bleed, on 9.26 he was transferred to hospice and no further workup was performed.   Issues for Follow Up:  1. Subdural hematoma, subarachnoid hemorrhage, interparenchymal hemorrhage: Has been placed in [facility] no further workup or treatment will be done.   2. ESRD on HD: Has been placed in [facility] will no longer undergo dialysis sessions..   3. Prostate cancer with bone metastasis: Has been placed in [facility] no further workup or treatment will be done. Will stop his medication.   4. CAD s/p CABG (2014): Has been placed in [facility] no further workup or treatment will be done.   5. Chronic Anemia/ GI bleed: Has been placed in [facility] no further workup or treatment will be done.   Significant Procedures: On 9/24 had an Upper Endoscopy with the following findings:A 1 cm hiatal hernia was present. The exam of the esophagus was otherwise normal. The entire examined stomach was normal. No blood present. The cardia and gastric fundus were normal on retroflexion.Patchy moderately erythematous and edematous mucosa without active bleeding and with no stigmata of bleeding was found in the second portion of the duodenum. Careful inspection for ulcers was negative.   Significant Labs and Imaging:  Recent Labs  Lab 10/03/19 0058 10/03/19 1735 10/04/19 0453  WBC 9.0 8.8 9.0  HGB 8.2* 8.5* 8.3*  HCT 25.0* 25.9* 25.6*  PLT 289 292 278   Recent Labs  Lab 10/01/19 0937 10/01/19 0937 10/02/19 0703 10/02/19 0703 10/03/19 0058  10/04/19 0453  NA 134*  --  132*  --  137 139  K 4.3   < > 4.7   < > 3.8 3.9  CL 89*  --  92*  --  95* 98  CO2 30  --  22  --  26 23  GLUCOSE 84  --  96  --  115* 98  BUN 48*  --  67*  --  36* 57*  CREATININE 5.02*  --  6.18*  --  4.10* 6.08*  CALCIUM 8.6*  --  7.7*  --  7.7* 7.3*  MG  --   --   --   --  1.9  --   PHOS  --   --  4.1  --  3.2 4.0  ALKPHOS 48  --   --   --   --   --   AST 45*  --   --   --   --   --   ALT 14  --   --   --   --   --   ALBUMIN 2.2*  --  1.9*  --  1.8* 1.6*   < > = values in this interval not displayed.    CT Head WO Contrast: When comparing across modalities, similar size of an approximately 1.1 cm left frontal convexity subdural  hemorrhage. Small volume of intraventricular hemorrhage, likely slightly decreased. No progressive ventriculomegaly. No evidence of new/acute hemorrhage or progressive mass effect. Left greater than right frontal sinus opacification. Correlate with signs/symptoms of sinusitis. Similar chronic microvascular ischemic disease.   EEG: Study suggestive of moderate diffuse encephalopathy, nonspecific etiology. No seizure or epileptiform discharges seen throughout the recording.   MR Brain WO Contrast: New left frontal subdural hematoma measuring up to 1.2 cm with subarachnoid hemorrhage. Small volume left anterior and bilateral occipital horn intraventricular hemorrhage. Mild cerebral atrophy. Advanced chronic microvascular ischemic changes.   CT Head WO Contrast:Atrophy, chronic microvascular disease. No acute intracranialabnormality. Chronic sinusitis.   DG Chest Portable 1 View:No evidence of acute cardiopulmonary abnormality. Stable mild cardiomegaly status post CABG.   ZOX:WRUEA rhythm, and abnormal/inverted T waves in inferior lateral leads, unchanged from prior EKG in 2020   Results/Tests Pending at Time of Discharge: None  Discharge Medications:  Allergies as of 10/05/2019   No Known Allergies      Medication List    STOP taking these medications   amLODipine 5 MG tablet Commonly known as: NORVASC   aspirin EC 81 MG tablet   atorvastatin 20 MG tablet Commonly known as: LIPITOR   cinacalcet 60 MG tablet Commonly known as: SENSIPAR   Dialyvite 800 0.8 MG Tabs   latanoprost 0.005 % ophthalmic solution Commonly known as: XALATAN   leuprolide 30 MG injection Commonly known as: LUPRON   pantoprazole 40 MG tablet Commonly known as: PROTONIX Replaced by: pantoprazole 40 MG injection   Xtandi 40 MG capsule Generic drug: enzalutamide     TAKE these medications   acetaminophen 650 MG suppository Commonly known as: TYLENOL Place 1 suppository (650 mg total) rectally every 8 (eight) hours.   antiseptic oral rinse Liqd Apply 15 mLs topically as needed for dry mouth.   bisacodyl 10 MG suppository Commonly known as: DULCOLAX Place 1 suppository (10 mg total) rectally daily as needed for moderate constipation.   dextrose 5 % solution Inject 50-75 mL/hr into the vein continuous as needed.   glycopyrrolate 1 MG tablet Commonly known as: ROBINUL Take 1 tablet (1 mg total) by mouth every 4 (four) hours as needed (excessive secretions).   glycopyrrolate 0.2 MG/ML injection Commonly known as: ROBINUL Inject 1 mL (0.2 mg total) into the skin every 4 (four) hours as needed (excessive secretions).   glycopyrrolate 0.2 MG/ML injection Commonly known as: ROBINUL Inject 1 mL (0.2 mg total) into the vein every 4 (four) hours as needed (excessive secretions).   haloperidol 0.5 MG tablet Commonly known as: HALDOL Take 1 tablet (0.5 mg total) by mouth every 4 (four) hours as needed for agitation (or delirium).   haloperidol 2 MG/ML solution Commonly known as: HALDOL Place 0.3 mLs (0.6 mg total) under the tongue every 4 (four) hours as needed for agitation (or delirium).   haloperidol lactate 5 MG/ML injection Commonly known as: HALDOL Inject 0.1 mLs (0.5 mg total) into the  vein every 4 (four) hours as needed (or delirium).   HYDROmorphone 1 MG/ML injection Commonly known as: DILAUDID Inject 0.5 mLs (0.5 mg total) into the vein every hour as needed for moderate pain (or dyspnea).   ondansetron 4 MG disintegrating tablet Commonly known as: ZOFRAN-ODT Take 1 tablet (4 mg total) by mouth every 6 (six) hours as needed for nausea.   ondansetron 4 MG/2ML Soln injection Commonly known as: ZOFRAN Inject 2 mLs (4 mg total) into the vein every 6 (six) hours as needed for  nausea.   pantoprazole 40 MG injection Commonly known as: PROTONIX Inject 40 mg into the vein daily. Replaces: pantoprazole 40 MG tablet   polyvinyl alcohol 1.4 % ophthalmic solution Commonly known as: LIQUIFILM TEARS Place 1 drop into both eyes 4 (four) times daily as needed for dry eyes.       Discharge Instructions: Please refer to Patient Instructions section of EMR for full details.  Patient was counseled important signs and symptoms that should prompt return to medical care, changes in medications, dietary instructions, activity restrictions, and follow up appointments.   Follow-Up Appointments:  Follow-up Information    Vallarie Mare, MD Follow up in 2 week(s).   Specialty: Neurosurgery Why: call for appointment, will need CT brain without contrast beforehand Contact information: [place] [address] [phone]               Briant Cedar, MD 10/06/2019, 8:54 AM PGY-1, [place]

## 2019-10-05 NOTE — Progress Notes (Signed)
Appreciate FPT and palliative assistance.  Shifting to comfort measures/ hospice care now. No further dialysis. Will sign off.   Kelly Splinter, MD 10/05/2019, 12:26 PM

## 2019-10-05 NOTE — Progress Notes (Signed)
Daily Progress Note   Patient Name: [name] [name]       Date: 10/05/2019 DOB: Jun 04, 1930  Age: 86 y.o. MRN#: [id number] Attending Physician: Dickie La, MD Primary Care Physician: [name], MD Admit Date: 10/01/2019  Reason for Consultation/Follow-up:  To discuss complex medical decision making related to patient's goals of care  Subjective: Talked with attending team and wife at bedside.  Patient appears to have had another seizure and is quite lethargic.  He is unable to maintain his blood glucose without D5.  Wife explains she was told by nephrology that he will not benefit from further hemodialysis.   Wife has remained quite strong and has tried to not burden the rest of the family protecting them and refusing their assistance.   She knows however that her 86 yo body will not allow her to care for him at home - so she requests a bed a [facility] [place]   She states she has visited several friends there.  She understands his prognosis is only days.  I encouraged her to tell her family to come.  He is very close to his grandson in [place].  I encouraged her to let him know that his grandfather's time is very short.   Assessment: Patient with ESRD, severe dementia, hypoglycemia, will no longer benefit from HD.  Likely has decompensation of his dementia.   Patient Profile/HPI:  86 y.o. male  with past medical history of prostate cancer with bone mets (follows with Dr. Alen Blew), diabetes, chronic anemia, hyperlipidemia, ESRD on HD (TTS), CAD s/p CABG 2014, hypertension, gout admitted on 10/01/2019 with weakness, lethargy, confusion. Through work up no evidence of stroke but subdural hematoma found on MRI along with "advanced chronic microvascular ischemic changes." No  changes or progression in subdural hematoma on repeat scans and no plans for surgical intervention. Hospitalization complicated by symptomatic anemia (Hgb goal > 8) and fever of unknown origin. Ongoing concern for mental status as he is noted to be alert but with ongoing confusion and unable to verbalize more than "uh huh" but following commands.     Length of Stay: 4   Vital Signs: BP (!) 102/40 (BP Location: Left Arm)   Pulse 79   Temp 99.4 F (37.4 C) (Oral)   Resp 20   Wt 70.5  kg   SpO2 100%   BMI 21.08 kg/m  SpO2: SpO2: 100 % O2 Device: O2 Device: Room Air O2 Flow Rate: O2 Flow Rate (L/min): 3 L/min       Palliative Assessment/Data: 10%     Palliative Care Plan    Recommendations/Plan:  Shift to comfort measures only.  Will continue D5 in order to allow family to visit if he remains in the hospital  Family has requested a bed at [facility].  He will be able to DC as soon as a bed is available.  Would continue keppra.  Added IV dilaudid as he is slightly agitated.  Code Status:  DNR  Prognosis:   Hours - Days   Discharge Planning:  Hospice facility  Care plan was discussed with resident, wife, RN  Thank you for allowing the Palliative Medicine Team to assist in the care of this patient.  Total time spent:  35 min.     Greater than 50%  of this time was spent counseling and coordinating care related to the above assessment and plan.  Florentina Jenny, PA-C Palliative Medicine  Please contact Palliative MedicineTeam phone at [phone] for questions and concerns between 7 am - 7 pm.   Please see AMION for individual provider pager numbers.

## 2019-10-05 NOTE — Progress Notes (Signed)
[facility] here to pick up pt and transport to [facility] via [facility]. Pt's belongings taken to [facility] by wife. Pt sleeping upon leaving. PIVs left in place.

## 2019-10-05 NOTE — Progress Notes (Addendum)
Seen and examined. Notified by the nurse after that that he is comfort measures/hospice only. Neurology will sign off.  -- Amie Portland, MD [facility] Pager: [phone] If 7pm to 7am, please call on call as listed on AMION.

## 2019-10-06 ENCOUNTER — Encounter (HOSPITAL_COMMUNITY): Payer: Self-pay | Admitting: Gastroenterology

## 2019-10-06 LAB — CULTURE, BLOOD (ROUTINE X 2)
Culture: NO GROWTH
Culture: NO GROWTH
Special Requests: ADEQUATE

## 2019-10-10 DEATH — deceased

## 2019-10-15 ENCOUNTER — Other Ambulatory Visit: Payer: Medicare Other

## 2019-10-15 ENCOUNTER — Ambulatory Visit: Payer: Medicare Other | Admitting: Oncology

## 2019-10-20 ENCOUNTER — Ambulatory Visit: Payer: Medicare Other | Admitting: Physician Assistant

## 2019-11-04 DIAGNOSIS — Z7189 Other specified counseling: Secondary | ICD-10-CM

## 2019-11-12 ENCOUNTER — Other Ambulatory Visit: Payer: Medicare Other

## 2019-12-10 ENCOUNTER — Other Ambulatory Visit: Payer: Medicare Other

## 2020-04-08 ENCOUNTER — Other Ambulatory Visit (HOSPITAL_COMMUNITY): Payer: Self-pay
